# Patient Record
Sex: Male | Born: 1945 | Race: White | Hispanic: No | Marital: Married | Smoking: Former smoker
Health system: Southern US, Community
[De-identification: ages and names within clinical notes are randomized; demographics above are authoritative.]

## PROBLEM LIST (undated history)

## (undated) DIAGNOSIS — I2510 Atherosclerotic heart disease of native coronary artery without angina pectoris: Secondary | ICD-10-CM

## (undated) DIAGNOSIS — I219 Acute myocardial infarction, unspecified: Secondary | ICD-10-CM

## (undated) DIAGNOSIS — I4891 Unspecified atrial fibrillation: Secondary | ICD-10-CM

## (undated) DIAGNOSIS — I10 Essential (primary) hypertension: Secondary | ICD-10-CM

## (undated) DIAGNOSIS — Z973 Presence of spectacles and contact lenses: Secondary | ICD-10-CM

## (undated) DIAGNOSIS — M1990 Unspecified osteoarthritis, unspecified site: Secondary | ICD-10-CM

## (undated) DIAGNOSIS — K5790 Diverticulosis of intestine, part unspecified, without perforation or abscess without bleeding: Secondary | ICD-10-CM

## (undated) DIAGNOSIS — Z8701 Personal history of pneumonia (recurrent): Secondary | ICD-10-CM

## (undated) DIAGNOSIS — I4901 Ventricular fibrillation: Secondary | ICD-10-CM

## (undated) DIAGNOSIS — I4892 Unspecified atrial flutter: Secondary | ICD-10-CM

## (undated) DIAGNOSIS — J449 Chronic obstructive pulmonary disease, unspecified: Secondary | ICD-10-CM

## (undated) DIAGNOSIS — I513 Intracardiac thrombosis, not elsewhere classified: Secondary | ICD-10-CM

## (undated) DIAGNOSIS — I350 Nonrheumatic aortic (valve) stenosis: Secondary | ICD-10-CM

## (undated) HISTORY — DX: Diverticulosis of intestine, part unspecified, without perforation or abscess without bleeding: K57.90

## (undated) HISTORY — DX: Atherosclerotic heart disease of native coronary artery without angina pectoris: I25.10

## (undated) HISTORY — DX: Ventricular fibrillation: I49.01

## (undated) HISTORY — DX: Acute myocardial infarction, unspecified: I21.9

## (undated) HISTORY — DX: Personal history of pneumonia (recurrent): Z87.01

## (undated) HISTORY — DX: Unspecified atrial flutter: I48.92

## (undated) HISTORY — PX: RETINAL LASER PROCEDURE: SHX2339

## (undated) HISTORY — PX: OTHER SURGICAL HISTORY: SHX169

## (undated) HISTORY — DX: Unspecified atrial fibrillation: I48.91

## (undated) HISTORY — DX: Essential (primary) hypertension: I10

## (undated) HISTORY — DX: Unspecified osteoarthritis, unspecified site: M19.90

## (undated) HISTORY — DX: Nonrheumatic aortic (valve) stenosis: I35.0

## (undated) HISTORY — DX: Intracardiac thrombosis, not elsewhere classified: I51.3

---

## 2003-11-12 HISTORY — PX: COLONOSCOPY: SHX174

## 2004-02-22 ENCOUNTER — Ambulatory Visit (HOSPITAL_COMMUNITY): Admission: RE | Admit: 2004-02-22 | Discharge: 2004-02-22 | Payer: Self-pay | Admitting: Family Medicine

## 2004-03-20 ENCOUNTER — Ambulatory Visit (HOSPITAL_COMMUNITY): Admission: RE | Admit: 2004-03-20 | Discharge: 2004-03-20 | Payer: Self-pay | Admitting: Internal Medicine

## 2009-08-16 ENCOUNTER — Ambulatory Visit (HOSPITAL_COMMUNITY): Admission: RE | Admit: 2009-08-16 | Discharge: 2009-08-16 | Payer: Self-pay | Admitting: Family Medicine

## 2009-09-13 ENCOUNTER — Ambulatory Visit (HOSPITAL_COMMUNITY): Admission: RE | Admit: 2009-09-13 | Discharge: 2009-09-13 | Payer: Self-pay | Admitting: Family Medicine

## 2010-04-11 DIAGNOSIS — I513 Intracardiac thrombosis, not elsewhere classified: Secondary | ICD-10-CM

## 2010-04-11 DIAGNOSIS — I219 Acute myocardial infarction, unspecified: Secondary | ICD-10-CM

## 2010-04-11 DIAGNOSIS — I4901 Ventricular fibrillation: Secondary | ICD-10-CM

## 2010-04-11 HISTORY — DX: Intracardiac thrombosis, not elsewhere classified: I51.3

## 2010-04-11 HISTORY — DX: Acute myocardial infarction, unspecified: I21.9

## 2010-04-11 HISTORY — DX: Ventricular fibrillation: I49.01

## 2010-04-24 ENCOUNTER — Inpatient Hospital Stay (HOSPITAL_COMMUNITY): Admission: RE | Admit: 2010-04-24 | Discharge: 2010-05-07 | Payer: Self-pay | Admitting: Cardiology

## 2010-04-24 ENCOUNTER — Ambulatory Visit: Payer: Self-pay | Admitting: Cardiology

## 2010-04-25 ENCOUNTER — Encounter: Payer: Self-pay | Admitting: Cardiothoracic Surgery

## 2010-04-25 ENCOUNTER — Encounter: Payer: Self-pay | Admitting: Cardiology

## 2010-04-25 ENCOUNTER — Ambulatory Visit: Payer: Self-pay | Admitting: Cardiothoracic Surgery

## 2010-04-26 ENCOUNTER — Encounter: Payer: Self-pay | Admitting: Cardiology

## 2010-05-01 ENCOUNTER — Encounter: Payer: Self-pay | Admitting: Cardiothoracic Surgery

## 2010-05-01 ENCOUNTER — Encounter: Payer: Self-pay | Admitting: Cardiology

## 2010-05-04 HISTORY — PX: OTHER SURGICAL HISTORY: SHX169

## 2010-05-09 ENCOUNTER — Ambulatory Visit: Payer: Self-pay | Admitting: Cardiology

## 2010-05-15 ENCOUNTER — Telehealth: Payer: Self-pay | Admitting: Cardiology

## 2010-05-16 ENCOUNTER — Telehealth: Payer: Self-pay | Admitting: Cardiology

## 2010-05-16 ENCOUNTER — Ambulatory Visit: Payer: Self-pay | Admitting: Cardiovascular Disease

## 2010-05-23 ENCOUNTER — Ambulatory Visit: Payer: Self-pay | Admitting: Cardiology

## 2010-05-24 ENCOUNTER — Ambulatory Visit: Payer: Self-pay | Admitting: Cardiology

## 2010-05-24 DIAGNOSIS — R0989 Other specified symptoms and signs involving the circulatory and respiratory systems: Secondary | ICD-10-CM | POA: Insufficient documentation

## 2010-05-24 DIAGNOSIS — I4891 Unspecified atrial fibrillation: Secondary | ICD-10-CM

## 2010-05-24 DIAGNOSIS — I359 Nonrheumatic aortic valve disorder, unspecified: Secondary | ICD-10-CM

## 2010-05-24 DIAGNOSIS — I2510 Atherosclerotic heart disease of native coronary artery without angina pectoris: Secondary | ICD-10-CM

## 2010-05-30 ENCOUNTER — Ambulatory Visit: Payer: Self-pay | Admitting: Cardiology

## 2010-05-31 ENCOUNTER — Ambulatory Visit: Payer: Self-pay | Admitting: Cardiothoracic Surgery

## 2010-05-31 ENCOUNTER — Encounter: Payer: Self-pay | Admitting: Cardiology

## 2010-05-31 ENCOUNTER — Encounter: Admission: RE | Admit: 2010-05-31 | Discharge: 2010-05-31 | Payer: Self-pay | Admitting: Cardiothoracic Surgery

## 2010-06-05 ENCOUNTER — Encounter: Payer: Self-pay | Admitting: Cardiology

## 2010-06-07 ENCOUNTER — Ambulatory Visit: Payer: Self-pay | Admitting: Cardiology

## 2010-06-07 LAB — CONVERTED CEMR LAB: POC INR: 2.1

## 2010-06-13 ENCOUNTER — Ambulatory Visit: Payer: Self-pay | Admitting: Cardiology

## 2010-06-13 LAB — CONVERTED CEMR LAB: POC INR: 2.4

## 2010-06-18 ENCOUNTER — Encounter (HOSPITAL_COMMUNITY): Admission: RE | Admit: 2010-06-18 | Discharge: 2010-07-18 | Payer: Self-pay | Admitting: Cardiology

## 2010-06-25 ENCOUNTER — Encounter: Payer: Self-pay | Admitting: Cardiology

## 2010-06-27 ENCOUNTER — Ambulatory Visit: Payer: Self-pay | Admitting: Cardiology

## 2010-06-28 ENCOUNTER — Ambulatory Visit: Payer: Self-pay | Admitting: Cardiology

## 2010-06-28 ENCOUNTER — Encounter (INDEPENDENT_AMBULATORY_CARE_PROVIDER_SITE_OTHER): Payer: Self-pay | Admitting: *Deleted

## 2010-06-28 LAB — CONVERTED CEMR LAB
Cholesterol: 163 mg/dL (ref 0–200)
Total Bilirubin: 0.4 mg/dL (ref 0.3–1.2)
Total CHOL/HDL Ratio: 3
Total Protein: 6.9 g/dL (ref 6.0–8.3)
Triglycerides: 61 mg/dL (ref <150)

## 2010-06-29 ENCOUNTER — Telehealth: Payer: Self-pay | Admitting: Cardiology

## 2010-07-05 ENCOUNTER — Encounter: Payer: Self-pay | Admitting: Cardiology

## 2010-07-05 ENCOUNTER — Ambulatory Visit: Payer: Self-pay | Admitting: Cardiology

## 2010-07-05 ENCOUNTER — Telehealth: Payer: Self-pay | Admitting: Cardiology

## 2010-07-05 LAB — CONVERTED CEMR LAB: POC INR: 3.1

## 2010-07-06 ENCOUNTER — Telehealth: Payer: Self-pay | Admitting: Cardiology

## 2010-07-06 LAB — CONVERTED CEMR LAB
BUN: 22 mg/dL (ref 6–23)
Calcium: 9.1 mg/dL (ref 8.4–10.5)
Creatinine, Ser: 1.16 mg/dL (ref 0.40–1.50)
HCT: 36.8 % — ABNORMAL LOW (ref 39.0–52.0)
Hemoglobin: 11.9 g/dL — ABNORMAL LOW (ref 13.0–17.0)
MCHC: 32.3 g/dL (ref 30.0–36.0)
Platelets: 247 10*3/uL (ref 150–400)
Potassium: 4.4 meq/L (ref 3.5–5.3)
RDW: 14.5 % (ref 11.5–15.5)
Sodium: 140 meq/L (ref 135–145)
WBC: 4.5 10*3/uL (ref 4.0–10.5)

## 2010-07-09 ENCOUNTER — Ambulatory Visit: Payer: Self-pay | Admitting: Internal Medicine

## 2010-07-09 ENCOUNTER — Ambulatory Visit (HOSPITAL_COMMUNITY): Admission: RE | Admit: 2010-07-09 | Discharge: 2010-07-09 | Payer: Self-pay | Admitting: Cardiology

## 2010-07-11 ENCOUNTER — Ambulatory Visit: Payer: Self-pay | Admitting: Cardiology

## 2010-07-11 LAB — CONVERTED CEMR LAB: POC INR: 2.7

## 2010-07-18 ENCOUNTER — Encounter (HOSPITAL_COMMUNITY)
Admission: RE | Admit: 2010-07-18 | Discharge: 2010-08-17 | Payer: Self-pay | Source: Home / Self Care | Admitting: Cardiology

## 2010-07-18 ENCOUNTER — Ambulatory Visit: Payer: Self-pay | Admitting: Cardiology

## 2010-07-18 LAB — CONVERTED CEMR LAB: POC INR: 2.8

## 2010-07-25 ENCOUNTER — Ambulatory Visit: Payer: Self-pay | Admitting: Cardiology

## 2010-08-01 ENCOUNTER — Ambulatory Visit: Payer: Self-pay | Admitting: Cardiology

## 2010-08-06 ENCOUNTER — Ambulatory Visit: Payer: Self-pay | Admitting: Cardiology

## 2010-08-07 ENCOUNTER — Encounter: Payer: Self-pay | Admitting: Cardiology

## 2010-08-08 ENCOUNTER — Ambulatory Visit: Payer: Self-pay | Admitting: Cardiology

## 2010-08-13 ENCOUNTER — Ambulatory Visit: Payer: Self-pay | Admitting: Cardiology

## 2010-08-13 LAB — CONVERTED CEMR LAB: POC INR: 3.7

## 2010-08-17 ENCOUNTER — Encounter (HOSPITAL_COMMUNITY)
Admission: RE | Admit: 2010-08-17 | Discharge: 2010-09-16 | Payer: Self-pay | Source: Home / Self Care | Admitting: Cardiology

## 2010-08-23 ENCOUNTER — Telehealth: Payer: Self-pay | Admitting: Cardiology

## 2010-08-24 ENCOUNTER — Encounter: Payer: Self-pay | Admitting: Cardiology

## 2010-09-03 ENCOUNTER — Ambulatory Visit: Payer: Self-pay | Admitting: Cardiology

## 2010-09-17 ENCOUNTER — Encounter (HOSPITAL_COMMUNITY)
Admission: RE | Admit: 2010-09-17 | Discharge: 2010-10-17 | Payer: Self-pay | Source: Home / Self Care | Admitting: Cardiology

## 2010-09-24 ENCOUNTER — Ambulatory Visit: Payer: Self-pay | Admitting: Cardiology

## 2010-09-24 LAB — CONVERTED CEMR LAB: POC INR: 2.4

## 2010-09-27 ENCOUNTER — Ambulatory Visit: Payer: Self-pay | Admitting: Cardiothoracic Surgery

## 2010-09-27 ENCOUNTER — Encounter: Payer: Self-pay | Admitting: Cardiology

## 2010-10-01 ENCOUNTER — Ambulatory Visit: Payer: Self-pay | Admitting: Cardiology

## 2010-10-01 ENCOUNTER — Ambulatory Visit: Payer: Self-pay | Admitting: Cardiovascular Disease

## 2010-10-01 ENCOUNTER — Encounter (INDEPENDENT_AMBULATORY_CARE_PROVIDER_SITE_OTHER): Payer: Self-pay | Admitting: *Deleted

## 2010-10-08 ENCOUNTER — Ambulatory Visit: Payer: Self-pay | Admitting: Cardiology

## 2010-10-08 LAB — CONVERTED CEMR LAB: POC INR: 3.4

## 2010-10-10 ENCOUNTER — Ambulatory Visit: Payer: Self-pay | Admitting: Cardiology

## 2010-10-10 ENCOUNTER — Ambulatory Visit (HOSPITAL_COMMUNITY)
Admission: RE | Admit: 2010-10-10 | Discharge: 2010-10-10 | Payer: Self-pay | Source: Home / Self Care | Admitting: Cardiology

## 2010-10-15 ENCOUNTER — Ambulatory Visit: Payer: Self-pay | Admitting: Cardiology

## 2010-10-15 LAB — CONVERTED CEMR LAB: POC INR: 3.2

## 2010-10-24 ENCOUNTER — Ambulatory Visit: Payer: Self-pay | Admitting: Cardiovascular Disease

## 2010-10-24 LAB — CONVERTED CEMR LAB: POC INR: 3

## 2010-10-26 ENCOUNTER — Ambulatory Visit: Payer: Self-pay | Admitting: Cardiology

## 2010-10-26 ENCOUNTER — Encounter: Payer: Self-pay | Admitting: Cardiology

## 2010-10-31 ENCOUNTER — Ambulatory Visit: Payer: Self-pay | Admitting: Cardiology

## 2010-10-31 LAB — CONVERTED CEMR LAB: POC INR: 2

## 2010-11-08 ENCOUNTER — Ambulatory Visit: Payer: Self-pay | Admitting: Cardiology

## 2010-11-21 ENCOUNTER — Ambulatory Visit: Admit: 2010-11-21 | Payer: Self-pay

## 2010-11-21 ENCOUNTER — Encounter (INDEPENDENT_AMBULATORY_CARE_PROVIDER_SITE_OTHER): Payer: Self-pay | Admitting: *Deleted

## 2010-11-28 ENCOUNTER — Ambulatory Visit
Admission: RE | Admit: 2010-11-28 | Discharge: 2010-11-28 | Payer: Self-pay | Source: Home / Self Care | Attending: Cardiology | Admitting: Cardiology

## 2010-11-28 LAB — CONVERTED CEMR LAB: POC INR: 1.4

## 2010-11-30 ENCOUNTER — Encounter: Payer: Self-pay | Admitting: Cardiology

## 2010-11-30 ENCOUNTER — Other Ambulatory Visit: Payer: Self-pay | Admitting: Cardiology

## 2010-11-30 ENCOUNTER — Ambulatory Visit
Admission: RE | Admit: 2010-11-30 | Discharge: 2010-11-30 | Payer: Self-pay | Source: Home / Self Care | Attending: Cardiology | Admitting: Cardiology

## 2010-11-30 LAB — CBC WITH DIFFERENTIAL/PLATELET
Basophils Absolute: 0 10*3/uL (ref 0.0–0.1)
Basophils Relative: 0.7 % (ref 0.0–3.0)
Eosinophils Absolute: 0.3 10*3/uL (ref 0.0–0.7)
Eosinophils Relative: 4.6 % (ref 0.0–5.0)
HCT: 38.3 % — ABNORMAL LOW (ref 39.0–52.0)
Hemoglobin: 13.1 g/dL (ref 13.0–17.0)
Lymphocytes Relative: 20.4 % (ref 12.0–46.0)
Lymphs Abs: 1.2 10*3/uL (ref 0.7–4.0)
MCHC: 34.3 g/dL (ref 30.0–36.0)
MCV: 100.1 fl — ABNORMAL HIGH (ref 78.0–100.0)
Monocytes Absolute: 0.7 10*3/uL (ref 0.1–1.0)
Monocytes Relative: 12 % (ref 3.0–12.0)
Neutro Abs: 3.8 10*3/uL (ref 1.4–7.7)
Neutrophils Relative %: 62.3 % (ref 43.0–77.0)
Platelets: 201 10*3/uL (ref 150.0–400.0)
RBC: 3.82 Mil/uL — ABNORMAL LOW (ref 4.22–5.81)
RDW: 16.3 % — ABNORMAL HIGH (ref 11.5–14.6)
WBC: 6.1 10*3/uL (ref 4.5–10.5)

## 2010-11-30 LAB — HEPATIC FUNCTION PANEL
ALT: 16 U/L (ref 0–53)
AST: 19 U/L (ref 0–37)
Albumin: 3.9 g/dL (ref 3.5–5.2)
Alkaline Phosphatase: 67 U/L (ref 39–117)
Bilirubin, Direct: 0.1 mg/dL (ref 0.0–0.3)
Total Bilirubin: 0.5 mg/dL (ref 0.3–1.2)
Total Protein: 7.2 g/dL (ref 6.0–8.3)

## 2010-11-30 LAB — LIPID PANEL
Cholesterol: 153 mg/dL (ref 0–200)
HDL: 71.2 mg/dL (ref >39.00)
LDL Cholesterol: 72 mg/dL (ref 0–99)
Total CHOL/HDL Ratio: 2
Triglycerides: 48 mg/dL (ref 0.0–149.0)
VLDL: 9.6 mg/dL (ref 0.0–40.0)

## 2010-11-30 LAB — BASIC METABOLIC PANEL
BUN: 23 mg/dL (ref 6–23)
CO2: 29 mEq/L (ref 19–32)
Calcium: 9.5 mg/dL (ref 8.4–10.5)
Chloride: 103 mEq/L (ref 96–112)
Creatinine, Ser: 1.1 mg/dL (ref 0.4–1.5)
GFR: 73.65 mL/min (ref >60.00)
Glucose, Bld: 101 mg/dL — ABNORMAL HIGH (ref 70–99)
Potassium: 4.7 mEq/L (ref 3.5–5.1)
Sodium: 139 mEq/L (ref 135–145)

## 2010-12-02 ENCOUNTER — Encounter: Payer: Self-pay | Admitting: Emergency Medicine

## 2010-12-03 ENCOUNTER — Encounter: Payer: Self-pay | Admitting: Cardiology

## 2010-12-04 ENCOUNTER — Telehealth: Payer: Self-pay | Admitting: Cardiology

## 2010-12-05 ENCOUNTER — Ambulatory Visit: Admission: RE | Admit: 2010-12-05 | Discharge: 2010-12-05 | Payer: Self-pay | Source: Home / Self Care

## 2010-12-05 ENCOUNTER — Telehealth: Payer: Self-pay | Admitting: Cardiology

## 2010-12-05 LAB — CONVERTED CEMR LAB: POC INR: 2.1

## 2010-12-11 NOTE — Assessment & Plan Note (Signed)
Summary: eph   Visit Type:  Follow-up- post hospital Primary Provider:  [name]  CC:  The pt is s/p aortic valve replacement on 05/04/10. He denies any cardiac complaints. He has had an episode of gout. He finished a course of prednisone yesterday and is on colcrys..  History of Present Illness: Patient is 65 years old and turned for management of CAD and died of heart disease after his recent open heart surgery. He was hospitalized with a posterior wall infarction and the catheterization was found to have embolized to marginal branches the circumflex artery. We attempted to open these but one of these but it was not successful and he realizes for embolus that was fairly distal in the vessel. He also was found to have severe calcific aortic stenosis. The day after his infarct he went into atrial fibrillation. We did a TEE and found a large amount of clot in his left atrial appendage. He subsequently underwent surgery with aortic valve replacement, removal of the thrombus in the left atrium, oversewing of the left atrial appendage, and a maze procedure. He was discharged home on amiodarone and Coumadin.  He's done quite well since discharge. His exercise tolerance is much better than it was previously.  Problems Prior to Update: 1)  Aortic Valve Disorders  (ICD-424.1) 2)  Tachycardia  (ICD-785) 3)  Atrial Fibrillation  (ICD-427.31) 4)  Cad, Native Vessel  (ICD-414.01)  Current Medications (verified): 1)  Amiodarone Hcl 200 Mg Tabs (Amiodarone Hcl) .... Take One Tablet By Mouth Twice A Day 2)  Aspirin 81 Mg Tbec (Aspirin) .... Take One Tablet By Mouth Daily 3)  Nu-Iron 150 Mg Caps (Polysaccharide Iron Complex) .... Take One Tab By Mouth Once Daily 4)  Metoprolol Succinate 25 Mg Xr24h-Tab (Metoprolol Succinate) .... Take One- Half  Tablet By Mouth Daily 5)  Crestor 40 Mg Tabs (Rosuvastatin Calcium) .... Take One Tablet By Mouth Daily. 6)  Tramadol Hcl 50 Mg Tabs (Tramadol Hcl) ....  Take One Tab By Mouth Every 4 Hours As Needed For Pain 7)  Warfarin Sodium 5 Mg Tabs (Warfarin Sodium) .... Use As Directed By Anticoagulation Clinic 8)  Albuterol Inhaler .... One To Two Puffs By Mouth Every 4 Hours As Needed 9)  Allopurinol 100 Mg Tabs (Allopurinol) .... Take One Tab By Mouth Once Daily 10)  Colcrys 0.6 Mg Tabs (Colchicine) .... Take One Tab By Mouth Once Daily  Allergies (verified): 1)  ! * Diclofenac  Past History:  Past Medical History: Reviewed history from 05/23/2010 and no changes required.  1. Acute myocardial infarction.   2. Ventricular fibrillation arrest.   3. Critical aortic stenosis.   4. Atrial fibrillation.   5. Intraatrial clot.   6. History of gouty arthritis.   7. History of pneumonia.   8. History of diverticulosis.   9. Postoperative acute blood loss anemia.   10.Postoperative hypotension.      Review of Systems       ROS is negative except as outlined in HPI.   Vital Signs:  Patient profile:   65 year old male Height:      70 inches Weight:      160.25 pounds BMI:     23.08 Pulse rate:   81 / minute BP sitting:   110 / 72  (left arm) Cuff size:   large  Physical Exam  Additional Exam:  Gen. Well-nourished, in no distress   Neck: No JVD, thyroid not enlarged, no carotid bruits Lungs: No tachypnea,  clear without rales, rhonchi or wheezes Cardiovascular: Rhythm irregular, PMI not displaced,  heart sounds  normal, grade 2 of 6 systolic ejection murmur at the left sternal edge, no peripheral edema, pulses normal in all 4 extremities. Abdomen: BS normal, abdomen soft and non-tender without masses or organomegaly, no hepatosplenomegaly. MS: No deformities, no cyanosis or clubbing   Neuro:  No focal sns   Skin:  no lesions    Impression & Recommendations:  Problem # 1:  CAD, NATIVE VESSEL (ICD-414.01) On April 21, 2010 he had a posterior infarct complicated by ventricular fibrillation the catheter lab. His infarct was due to emboli  to the circumflex artery. We tried to open one of the marginal branches but this was not successful due to the distal location of the embolus. He recovered well from the standpoint of his heart attack and has had no recurrent chest pain his problem appears stable. I looked at my catheter report and described only minimal irregularity in the right coronary apart from his 2 emboli so we may not need to treat him as secondary prevention with regard to his statin therapy His updated medication list for this problem includes:    Aspirin 81 Mg Tbec (Aspirin) .Marland Kitchen... Take one tablet by mouth daily    Metoprolol Succinate 25 Mg Xr24h-tab (Metoprolol succinate) .Marland Kitchen... Take one- half  tablet by mouth daily    Warfarin Sodium 5 Mg Tabs (Warfarin sodium) ..... Use as directed by anticoagulation clinic  Problem # 2:  ATRIAL FIBRILLATION (ICD-427.31) He had atrial fibrillation prior to his surgery. He had left atrial thrombus with embolization and was treated with thrombectomy, oversewing of the atrial appendage, and a Maze procedure. He was also discharged on amiodarone. Unfortunately he is in recurrent atrial fibrillation today. We will continue Coumadin amiodarone and make a decision a little bit later about cardioversion. His updated medication list for this problem includes:    Amiodarone Hcl 200 Mg Tabs (Amiodarone hcl) .Marland Kitchen... Take one tablet by mouth once daily    Aspirin 81 Mg Tbec (Aspirin) .Marland Kitchen... Take one tablet by mouth daily    Metoprolol Succinate 25 Mg Xr24h-tab (Metoprolol succinate) .Marland Kitchen... Take one- half  tablet by mouth daily    Warfarin Sodium 5 Mg Tabs (Warfarin sodium) ..... Use as directed by anticoagulation clinic  Problem # 3:  AORTIC VALVE DISORDERS (ICD-424.1) he is now status post aortic valve replacement with tissue valve for aortic stenosis which was found the time of his catheterization. This polyp is stable. He is a tissue valve so if we think he can come off Coumadin from the standpoint of  his atrial fibrillation, he would be able to come off of it from the standpoint of his heart valve. His updated medication list for this problem includes:    Metoprolol Succinate 25 Mg Xr24h-tab (Metoprolol succinate) .Marland Kitchen... Take one- half  tablet by mouth daily  Patient Instructions: 1)  Decrease Crestor to 40mg  1/2 tablet once daily for 2 weeks then stop this medication. 2)  Decrease Amiodarone to 200mg  once daily. 3)  You will need to have your coumadin checked in 1 week due to the change in your amiodarone dose. 4)  Please come FASTING to your next visit- we will be checking your lipid panel at that visit. 5)  Your physician recommends that you schedule a follow-up appointment in: 5 weeks.

## 2010-12-11 NOTE — Medication Information (Signed)
Summary: rov/sl  Anticoagulant Therapy  Managed by: Bethena Midget, RN, BSN PCP: [name] Supervising MD: Dietrich Pates MD, Molly Maduro Indication 1: Aortic Valve Replacement Valve Type: Bioprosthetic Valve Position: Aortic Lab Used: [facility] [place] Site: [address] INR POC 2.9 INR RANGE 2.0-3.0  Dietary changes: no    Health status changes: no    Bleeding/hemorrhagic complications: no    Recent/future hospitalizations: no    Any changes in medication regimen? no    Recent/future dental: no  Any missed doses?: no       Is patient compliant with meds? yes      Comments: Seeing Dr Juanda Chance today. Pending DCCV next week.   Allergies: 1)  ! * Diclofenac  Anticoagulation Management History:      The patient is taking warfarin and comes in today for a routine follow up visit.  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  Positive risk factors for bleeding include an age of 66 years or older.  Negative risk factors for bleeding include no history of CVA/TIA, no history of GI bleeding, and absence of serious comorbidities.  The bleeding index is 'intermediate risk'.  Negative CHADS2 values include History of CHF, History of HTN, Age > 69 years old, History of Diabetes, and Prior Stroke/CVA/TIA.  The start date was 05/06/2010.  Anticoagulation responsible provider: Dietrich Pates MD, Molly Maduro.  INR POC: 2.9.  Cuvette Lot#: [id number].  Exp: 10/2011.    Anticoagulation Management Assessment/Plan:      The patient's current anticoagulation dose is Warfarin sodium 5 mg tabs: Use as directed by Anticoagulation Clinic.  The target INR is 2.0-3.0.  The next INR is due 10/15/2010.  Anticoagulation instructions were given to patient.  Results were reviewed/authorized by Bethena Midget, RN, BSN.  He was notified by Bethena Midget, RN, BSN.         Prior Anticoagulation Instructions: INR 2.4 Continue coumadin 7.5mg  once daily except 5mg  on Thursdays   Current  Anticoagulation Instructions: INR 2.9 Continue 7.5mg s daily except 5mg s on Thursdays. Recheck in 2 weeks in [facility].

## 2010-12-11 NOTE — Assessment & Plan Note (Signed)
Summary: f22m   Visit Type:  Follow-up Primary Provider:  [name]   History of Present Illness: [name] is 65 years old and return for management of valvular heart disease and atrial fibrillation.  in June he was hospitalized with a posterior MI complicated by V. fib in the catheter lab do to emboli to the circumflex artery. He also had severe aortic stenosis. 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  On followup he develop recurrent atrial fibrillation was started on amiodarone and underwent a DC cardioversion on July 09, 2010. His INR had fallen to 1.9 so this was done with a TEE.  Unfortunately, when I saw him back for his last visit he was back in atrial fibrillation. His ECG actually look like atrial flutter. I reviewed the tracings with Dr. Ladona Galloway and he thought it was the left atrial load her which would be difficult for ablation and he has already had a Maze procedure.  We decided to leave him on amiodarone and consider the possibility of a second cardioversion.  He has done quite well since that time has had no chest pain [name] breath or palpitations.  Today is his and his wife's anniversary  Current Medications (verified): 1)  Amiodarone Hcl 200 Mg Tabs (Amiodarone Hcl) .... Take One Tablet By Mouth Once Daily 2)  Aspirin 81 Mg Tbec (Aspirin) .... Take One Tablet By Mouth Daily 3)  Metoprolol Succinate 25 Mg Xr24h-Tab (Metoprolol Succinate) .... Take One- Half  Tablet By Mouth Daily 4)  Warfarin Sodium 5 Mg Tabs (Warfarin Sodium) .... Use As Directed By Anticoagulation Clinic 5)  Allopurinol 100 Mg Tabs (Allopurinol) .... Take One Tab By Mouth Once Daily 6)  Extra Strength Tylenol .... As Needed For Knee Pain  Allergies: 1)  ! * Diclofenac  Past History:  Past Medical History: Reviewed  history from 05/23/2010 and no changes required.  1. Acute myocardial infarction.   2. Ventricular fibrillation arrest.   3. Critical aortic stenosis.   4. Atrial fibrillation.   5. Intraatrial clot.   6. History of gouty arthritis.   7. History of pneumonia.   8. History of diverticulosis.   9. Postoperative acute blood loss anemia.   10.Postoperative hypotension.      Review of Systems       ROS is negative except as outlined in HPI.   Vital Signs:  Patient profile:   65 year old male Height:      70 inches Weight:      182 pounds BMI:     26.21 O2 Sat:      99 % Pulse rate:   87 / minute BP sitting:   110 / 80  (left arm)  Vitals Entered By: Laurance Flatten CMA (October 01, 2010 9:33 AM)  Physical Exam  Additional Exam:  Gen. Well-nourished, in no distress   Neck: No JVD, thyroid not enlarged, no carotid bruits Lungs: No tachypnea, clear without rales, rhonchi or wheezes Cardiovascular: Rhythm regular, PMI not displaced,  heart sounds  normal, no murmurs or gallops, no peripheral edema, pulses normal in all 4 extremities. Abdomen: BS normal, abdomen soft and non-tender without masses or organomegaly, no hepatosplenomegaly. MS: No deformities, no cyanosis or clubbing   Neuro:  No focal sns   Skin:  no lesions    Impression & Recommendations:  Problem # 1:  ATRIAL FIBRILLATION (ICD-427.31) He has recurrent atrial fibrillation which recurred after amiodarone and DC cardioversion. He has had a previous maze procedure. I think we should give him one more attempt at cardioversion even know he is having minimal symptoms. Since he has had his left atrial appendage so in all, if we can get him back in sinus rhythm, I would hold out the possibility that he may be able to come off Coumadin therapy despite his previous embolus. We will schedule him for DC cardioversion next week. His updated medication list for this problem includes:    Amiodarone Hcl 200 Mg Tabs (Amiodarone hcl)  .Marland Kitchen... Take one tablet by mouth once daily    Aspirin 81 Mg Tbec (Aspirin) .Marland Kitchen... Take one tablet by mouth daily    Metoprolol Succinate 25 Mg Xr24h-tab (Metoprolol succinate) .Marland Kitchen... Take one- half  tablet by mouth daily    Warfarin Sodium 5 Mg Tabs (Warfarin sodium) ..... Use as directed by anticoagulation clinic  Orders: EKG w/ Interpretation (93000) Cardioversion (Cardioversion)  Problem # 2:  CAD, NATIVE VESSEL (ICD-414.01) he had an embolus to his circumflex artery causing a posterior MI. He has no other coronary disease. This problem is stable. His updated medication list for this problem includes:    Aspirin 81 Mg Tbec (Aspirin) .Marland Kitchen... Take one tablet by mouth daily    Metoprolol Succinate 25 Mg Xr24h-tab (Metoprolol succinate) .Marland Kitchen... Take one- half  tablet by mouth daily    Warfarin Sodium 5 Mg Tabs (Warfarin sodium) ..... Use as directed by anticoagulation clinic  His updated medication list for this problem includes:    Aspirin 81 Mg Tbec (Aspirin) .Marland Kitchen... Take one tablet by mouth daily    Metoprolol Succinate 25 Mg Xr24h-tab (Metoprolol succinate) .Marland Kitchen... Take one- half  tablet by mouth daily    Warfarin Sodium 5 Mg Tabs (Warfarin sodium) ..... Use as directed by anticoagulation clinic  Problem # 3:  AORTIC VALVE DISORDERS (ICD-424.1) He had aortic valve replacement with a tissue valve. He is doing well and this is stable. His updated medication list for this problem includes:    Metoprolol Succinate 25 Mg Xr24h-tab (Metoprolol succinate) .Marland Kitchen... Take one- half  tablet by mouth daily  His updated medication list for this problem includes:    Metoprolol Succinate 25 Mg Xr24h-tab (Metoprolol succinate) .Marland Kitchen... Take one- half  tablet by mouth daily  Patient Instructions: 1)  You have been scheduled for a Cardioversion on Wed. 10/10/10. Please refer to the instruction sheet given to you today. 2)  Your physician recommends that you schedule a follow-up appointment in: 3 weeks. 3)  We will  check your coumadin level on Monday or Tuesday in the [place] office.

## 2010-12-11 NOTE — Progress Notes (Signed)
Summary: Question about lab and ccr appt  Phone Note Call from Patient Call back at [phone]   Caller: Spouse/ carolyn Summary of Call: Question about pt lab and ccr appt Initial call taken by: Judie Grieve,  June 29, 2010 2:30 PM  Follow-up for Phone Call        PT'S WIFE CALLED RE CC APPT SCHEDULED FOR 07/11/10 NEEDS TO DONE PRIOR TO TEE CARDIOVERSION  SCHEDULED  FOR 07/09/10 PT'S WIFE TO CALL [place] OFFICE  TO SET UP  AND TO HAVE PRE PROCEDURE LABS DONE AS WELL. Follow-up by: Scherrie Bateman, LPN,  June 29, 2010 2:42 PM

## 2010-12-11 NOTE — Medication Information (Signed)
Summary: ccr-lr  Anticoagulant Therapy  Managed by: Vashti Hey, RN PCP: [name] Supervising MD: Dietrich Pates MD, Molly Maduro Indication 1: Aortic Valve Replacement Valve Type: Bioprosthetic Valve Position: Aortic Lab Used: [facility] [place] Site: [place] INR POC 3.7  Dietary changes: no    Health status changes: no    Bleeding/hemorrhagic complications: no    Recent/future hospitalizations: no    Any changes in medication regimen? no    Recent/future dental: no  Any missed doses?: no       Is patient compliant with meds? yes       Allergies: 1)  ! * Diclofenac  Anticoagulation Management History:      The patient is taking warfarin and comes in today for a routine follow up visit.  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  Positive risk factors for bleeding include an age of 66 years or older.  Negative risk factors for bleeding include no history of CVA/TIA, no history of GI bleeding, and absence of serious comorbidities.  The bleeding index is 'intermediate risk'.  Negative CHADS2 values include History of CHF, History of HTN, Age > 43 years old, History of Diabetes, and Prior Stroke/CVA/TIA.  The start date was 05/06/2010.  Anticoagulation responsible provider: Dietrich Pates MD, Molly Maduro.  INR POC: 3.7.  Cuvette Lot#: [id number].    Anticoagulation Management Assessment/Plan:      The patient's current anticoagulation dose is Warfarin sodium 5 mg tabs: Use as directed by Anticoagulation Clinic.  The target INR is 2.0-3.0.  The next INR is due 09/03/2010.  Anticoagulation instructions were given to patient.  Results were reviewed/authorized by Vashti Hey, RN.  He was notified by Vashti Hey RN.         Prior Anticoagulation Instructions: INR 2.7 Continue coumadin 7.5mg  once daily   Current Anticoagulation Instructions: INR 3.7 Hold coumadin tonight then decrease dose to 7.5mg  once daily except 5mg  on Thursdays

## 2010-12-11 NOTE — Letter (Signed)
Summary: Cardioversion/TEE Instructions  Architectural technologist, Main Office  [address]   [place], [place] [address]   Phone: [phone]  Fax: [phone]    Cardioversion / TEE Cardioversion Instructions  06/28/2010 MRN: [id number]  [name] [address] [place], [place]  [address]  Dear [name], LESSER are scheduled for a Cardioversion on Monday 07/09/10 with Dr. Juanda Chance.   Please arrive at the [facility] of [facility] at 7:00 a.m. on the day of your procedure.  1)   DIET:  A)   Nothing to eat or drink after midnight except your medications with a sip of water.   2)   Come to the [place] office on (the day of your next coumadin check in [place]) for lab work. The lab at [facility] is open from 8:30 a.m. to 1:30 p.m. and 2:30 p.m. to 5:00 p.m. The lab at [facility] is open from 7:30 a.m. to 5:30 p.m. You do not have to be fasting.  3)   MAKE SURE YOU TAKE YOUR COUMADIN.  4)   A)   DO NOT TAKE these medications before your procedure:      _________________________________________________________________     ___________________________________________________________________     ___________________________________________________________________  B)   YOU MAY TAKE ALL of your remaining medications with a small amount of water.    C)   START NEW medications:       ___________________________________________________________________     ___________________________________________________________________  5)  Must have a responsible person to drive you home.  6)   Bring a current list of your medications and current insurance cards.   * Special Note:  Every effort is made to have your procedure done on time. Occasionally there are emergencies that present themselves at the hospital that may cause delays. Please be patient if a delay does occur.  * If you have any questions after you get home, please call the office  at [phone].

## 2010-12-11 NOTE — Progress Notes (Signed)
Summary: Is patient going to start Pradaxa?   Phone Note Outgoing Call   Call placed by: Sherri Rad, RN, BSN,  August 23, 2010 10:05 AM Call placed to: Patient Summary of Call: I left a message for the pt to call and see if he has decided to start Pradaxa or not. Sherri Rad, RN, BSN  August 23, 2010 10:06 AM  I spoke with the pt. He is not taking pradaxa. He will f/u with Dr. Juanda Chance on 11/21 as scheduled. Initial call taken by: Sherri Rad, RN, BSN,  August 23, 2010 4:38 PM

## 2010-12-11 NOTE — Medication Information (Signed)
Summary: ccr-lr  Anticoagulant Therapy  Managed by: Vashti Hey, RN PCP: [name] Supervising MD: [name], [name] Indication 1: Aortic Valve Replacement Valve Type: Bioprosthetic Valve Position: Aortic Lab Used: [facility] [place] Site: [address] INR POC 3.2 INR RANGE 2.0-3.0  Dietary changes: no    Health status changes: no    Bleeding/hemorrhagic complications: no    Recent/future hospitalizations: yes       Details: S/P DCCV 10/10/10  successful  Any changes in medication regimen? no    Recent/future dental: no  Any missed doses?: no       Is patient compliant with meds? yes       Allergies: 1)  ! * Diclofenac  Anticoagulation Management History:      The patient is taking warfarin and comes in today for a routine follow up visit.  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  Positive risk factors for bleeding include an age of 66 years or older.  Negative risk factors for bleeding include no history of CVA/TIA, no history of GI bleeding, and absence of serious comorbidities.  The bleeding index is 'intermediate risk'.  Negative CHADS2 values include History of CHF, History of HTN, Age > 55 years old, History of Diabetes, and Prior Stroke/CVA/TIA.  The start date was 05/06/2010.  Anticoagulation responsible provider: [name], [name].  INR POC: 3.2.  Cuvette Lot#: [id number].  Exp: 10/2011.    Anticoagulation Management Assessment/Plan:      The patient's current anticoagulation dose is Warfarin sodium 5 mg tabs: Use as directed by Anticoagulation Clinic.  The target INR is 2.0-3.0.  The next INR is due 10/24/2010.  Anticoagulation instructions were given to patient.  Results were reviewed/authorized by Vashti Hey, RN.  He was notified by Vashti Hey RN.        Coagulation management information includes: Aortic Tissue Valve 05/01/10     TEE/DCCV by Dr Juanda Chance on 10/10/10.  Prior Anticoagulation Instructions: INR  3.4 Continue coumadin 7.5mg  once daily except 5mg  on Thursdays  Current Anticoagulation Instructions: INR 3.2  (1st post DCCV) Decrease coumadin to 7.5mg  once daily except 5mg  on Mondays and Thursdays

## 2010-12-11 NOTE — Letter (Signed)
Summary: [facility] Office Visit   [facility] Office Visit   Imported By: Roderic Ovens 06/18/2010 12:40:30  _____________________________________________________________________  External Attachment:    Type:   Image     Comment:   External Document

## 2010-12-11 NOTE — Consult Note (Signed)
Summary: [facility]  [facility]   Imported By: Marylou Mccoy 06/06/2010 10:21:57  _____________________________________________________________________  External Attachment:    Type:   Image     Comment:   External Document

## 2010-12-11 NOTE — Progress Notes (Signed)
Summary: lab results  Phone Note Call from Patient Call back at Home Phone [phone]   Caller: Patient Reason for Call: Lab or Test Results Initial call taken by: Judie Grieve,  July 06, 2010 3:19 PM  Follow-up for Phone Call        I spoke with the pt. Follow-up by: Sherri Rad, RN, BSN,  July 06, 2010 3:26 PM

## 2010-12-11 NOTE — Medication Information (Signed)
Summary: ccr-lr  Anticoagulant Therapy  Managed by: Vashti Hey, RN PCP: 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 no    Recent/future dental: no  Any missed doses?: no       Is patient compliant with meds? yes       Allergies: 1)  ! * Diclofenac  Anticoagulation Management History:      The patient is taking warfarin and comes in today for a routine follow up visit.  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  Positive risk factors for bleeding include an age of 66 years or older.  Negative risk factors for bleeding include no history of CVA/TIA, no history of GI bleeding, and absence of serious comorbidities.  The bleeding index is 'intermediate risk'.  Negative CHADS2 values include History of CHF, History of HTN, Age > 57 years old, History of Diabetes, and Prior Stroke/CVA/TIA.  The start date was 05/06/2010.  Anticoagulation responsible provider: Diona Browner MD, Remi Deter.  INR POC: 3.4.  Cuvette Lot#: [id number].  Exp: 10/2011.    Anticoagulation Management Assessment/Plan:      The patient's current anticoagulation dose is Warfarin sodium 5 mg tabs: Use as directed by Anticoagulation Clinic.  The target INR is 2.0-3.0.  The next INR is due 10/15/2010.  Anticoagulation instructions were given to patient.  Results were reviewed/authorized by Vashti Hey, RN.  He was notified by Vashti Hey RN.        Coagulation management information includes: Aortic Tissue Valve 05/01/10       Pending TEE/DCCV by Dr Juanda Chance on 10/10/10.  Prior Anticoagulation  Instructions: INR 2.9 Continue 7.5mg s daily except 5mg s on Thursdays. Recheck in 2 weeks in RDS.   Current Anticoagulation Instructions: INR 3.4 Continue coumadin 7.5mg  once daily except 5mg  on Thursdays

## 2010-12-11 NOTE — Miscellaneous (Signed)
Summary: addded echo  Clinical Lists Changes  Observations: Added new observation of ECHOINTERP: Study Conclusions  - Left ventricle: Systolic function was normal. The estimated   ejection fraction was in the range of 50% to 55%. - Aortic valve: A bioprosthesis was present. Trivial regurgitation. - Mitral valve: No evidence of vegetation. Mild regurgitation. - Left atrium: The atrium was moderately dilated. No evidence of   thrombus in the atrial cavity or appendage. - Right atrium: The atrium was moderately dilated. - Tricuspid valve: No evidence of vegetation. Transesophageal echocardiography. 2D and color Doppler. Patient status: Outpatient. Location: Endoscopy.  (07/09/2010 16:21)      Echocardiogram  Procedure date:  07/09/2010  Findings:      Study Conclusions  - Left ventricle: Systolic function was normal. The estimated   ejection fraction was in the range of 50% to 55%. - Aortic valve: A bioprosthesis was present. Trivial regurgitation. - Mitral valve: No evidence of vegetation. Mild regurgitation. - Left atrium: The atrium was moderately dilated. No evidence of   thrombus in the atrial cavity or appendage. - Right atrium: The atrium was moderately dilated. - Tricuspid valve: No evidence of vegetation. Transesophageal echocardiography. 2D and color Doppler. Patient status: Outpatient. Location: Endoscopy.

## 2010-12-11 NOTE — Medication Information (Signed)
Summary: PROTIME/TG  Anticoagulant Therapy  Managed by: Vashti Hey, RN PCP: [name] Supervising MD: [name], [name] Indication 1: Aortic Valve Replacement Valve Type: Bioprosthetic Valve Position: Aortic Lab Used: [facility] [place] Site: [place] INR POC 2.7  Dietary changes: no    Health status changes: no    Bleeding/hemorrhagic complications: no    Recent/future hospitalizations: no    Any changes in medication regimen? no    Recent/future dental: no  Any missed doses?: no       Is patient compliant with meds? yes       Allergies: 1)  ! * Diclofenac  Anticoagulation Management History:      The patient is taking warfarin and comes in today for a routine follow up visit.  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  Negative risk factors for bleeding include an age less than 27 years old, no history of CVA/TIA, no history of GI bleeding, and absence of serious comorbidities.  The bleeding index is 'low risk'.  Negative CHADS2 values include History of CHF, History of HTN, Age > 102 years old, History of Diabetes, and Prior Stroke/CVA/TIA.  The start date was 05/06/2010.  Anticoagulation responsible provider: [name], [name].  INR POC: 2.7.    Anticoagulation Management Assessment/Plan:      The patient's current anticoagulation dose is Warfarin sodium 5 mg tabs: Use as directed by Anticoagulation Clinic.  The target INR is 2.0-3.0.  The next INR is due 07/18/2010.  Anticoagulation instructions were given to patient.  Results were reviewed/authorized by Vashti Hey, RN.  He was notified by Vashti Hey RN.         Prior Anticoagulation Instructions: INR 2.4 Continue coumadin 7.5mg  once daily   Current Anticoagulation Instructions: INR 2.7 Continue coumadin 7.5mg  once daily

## 2010-12-11 NOTE — Medication Information (Signed)
Summary: CCN  Anticoagulant Therapy  Managed by: Vashti Hey, RN Supervising MD: Dietrich Pates MD, Molly Maduro Indication 1: Aortic Valve Replacement Valve Type: Bioprosthetic Valve Position: Aortic Lab Used: [facility] [place] Site: [place] INR POC 1.6  Dietary changes: no    Health status changes: no    Bleeding/hemorrhagic complications: no    Recent/future hospitalizations: yes       Details: In [facility] 6/14 - 6/26  Acute MI, Aortic Valve Replacement  Any changes in medication regimen? yes       Details: Started on coumadin in hospital 5mg  qd.   Has 5mg  tablet.  Recent/future dental: no  Any missed doses?: no       Is patient compliant with meds? yes       Anticoagulation Management History:      The patient comes in today for his initial visit for anticoagulation therapy.  The patient is on warfarin for a tissue prosthetic heart valve.  Negative risk factors for bleeding include an age less than 6 years old, no history of CVA/TIA, no history of GI bleeding, and absence of serious comorbidities.  The bleeding index is 'low risk'.  Negative CHADS2 values include History of CHF, History of HTN, Age > 56 years old, History of Diabetes, and Prior Stroke/CVA/TIA.  The start date was 05/06/2010.  Anticoagulation responsible provider: Dietrich Pates MD, Molly Maduro.  INR POC: 1.6.  Cuvette Lot#: [id number].    Anticoagulation Management Assessment/Plan:      The target INR is 2.0-3.0.  The next INR is due 05/16/2010.  Anticoagulation instructions were given to patient.  Results were reviewed/authorized by Vashti Hey, RN.  He was notified by Vashti Hey RN.        Coagulation management information includes: Aortic Tissue Valve 05/01/10.  Current Anticoagulation Instructions: INR 1.6 Take coumadin 7.5mg  tonight then resume 5mg  once daily

## 2010-12-11 NOTE — Op Note (Signed)
Summary: [facility]   [facility]   Imported By: Roderic Ovens 05/28/2010 10:44:23  _____________________________________________________________________  External Attachment:    Type:   Image     Comment:   External Document

## 2010-12-11 NOTE — Medication Information (Signed)
Summary: ccr-lr  Anticoagulant Therapy  Managed by: Tyler Hey, RN PCP: [name] Supervising Galloway: [name], Remi Deter Indication 1: Aortic Valve Replacement Valve Type: Bioprosthetic Valve Position: Aortic Lab Used: [facility] [place] Site: [place] INR POC 2.1  Dietary changes: no    Health status changes: no    Bleeding/hemorrhagic complications: no    Recent/future hospitalizations: no    Any changes in medication regimen? no    Recent/future dental: no  Any missed doses?: no       Is patient compliant with meds? yes       Allergies: 1)  ! * Diclofenac  Anticoagulation Management History:      The patient is taking warfarin and comes in today for a routine follow up visit.  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  Negative risk factors for bleeding include an age less than 44 years old, no history of CVA/TIA, no history of GI bleeding, and absence of serious comorbidities.  The bleeding index is 'low risk'.  Negative CHADS2 values include History of CHF, History of HTN, Age > 93 years old, History of Diabetes, and Prior Stroke/CVA/TIA.  The start date was 05/06/2010.  Anticoagulation responsible provider: Diona Browner Galloway, Remi Deter.  INR POC: 2.1.  Cuvette Lot#: [id number].    Anticoagulation Management Assessment/Plan:      The patient's current anticoagulation dose is Warfarin sodium 5 mg tabs: Use as directed by Anticoagulation Clinic.  The target INR is 2.0-3.0.  The next INR is due 08/01/2010.  Anticoagulation instructions were given to patient.  Results were reviewed/authorized by Tyler Hey, RN.  He was notified by Tyler Hey RN.         Prior Anticoagulation Instructions: INR 2.8 Continue coumadin 7.5mg  once daily except 5mg  on Thursdays  Current Anticoagulation Instructions: INR 2.1 Continue coumadin 7.5mg  once daily except 5mg  on Thursdays

## 2010-12-11 NOTE — Progress Notes (Signed)
Summary: eph appt   Phone Note Call from Patient Call back at [phone]   Caller: Spouse Reason for Call: Talk to Nurse Summary of Call: due for 2 week eph with Dr Juanda Chance, states he needs to be seen before then, request to speak to nurse Initial call taken by: Migdalia Dk,  May 15, 2010 9:55 AM  Follow-up for Phone Call        [facility]. Sherri Rad, RN, BSN  May 15, 2010 10:55 AM I spoke with the pt's wife and he is scheduled for 7/14 with Dr. Juanda Chance. Follow-up by: Sherri Rad, RN, BSN,  May 15, 2010 11:51 AM

## 2010-12-11 NOTE — Medication Information (Signed)
Summary: ccr-lr  Anticoagulant Therapy  Managed by: Vashti Hey, RN PCP: [name] Supervising MD: Dietrich Pates MD, Molly Maduro Indication 1: Aortic Valve Replacement Valve Type: Bioprosthetic Valve Position: Aortic Lab Used: [facility] [place] Site: [place] INR POC 2.8  Dietary changes: no    Health status changes: no    Bleeding/hemorrhagic complications: no    Recent/future hospitalizations: no    Any changes in medication regimen? no    Recent/future dental: no  Any missed doses?: no       Is patient compliant with meds? yes       Allergies: 1)  ! * Diclofenac  Anticoagulation Management History:      The patient is taking warfarin and comes in today for a routine follow up visit.  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  Negative risk factors for bleeding include an age less than 42 years old, no history of CVA/TIA, no history of GI bleeding, and absence of serious comorbidities.  The bleeding index is 'low risk'.  Negative CHADS2 values include History of CHF, History of HTN, Age > 63 years old, History of Diabetes, and Prior Stroke/CVA/TIA.  The start date was 05/06/2010.  Anticoagulation responsible provider: Dietrich Pates MD, Molly Maduro.  INR POC: 2.8.  Cuvette Lot#: [id number].    Anticoagulation Management Assessment/Plan:      The patient's current anticoagulation dose is Warfarin sodium 5 mg tabs: Use as directed by Anticoagulation Clinic.  The target INR is 2.0-3.0.  The next INR is due 07/25/2010.  Anticoagulation instructions were given to patient.  Results were reviewed/authorized by Vashti Hey, RN.  He was notified by Vashti Hey RN.         Prior Anticoagulation Instructions: INR 2.7 Continue coumadin 7.5mg  once daily except 5mg  on Thursdays  Current Anticoagulation Instructions: INR 2.8 Continue coumadin 7.5mg  once daily except 5mg  on Thursdays

## 2010-12-11 NOTE — Medication Information (Signed)
Summary: ccr-lr  Anticoagulant Therapy  Managed by: Vashti Hey, RN PCP: [name] Supervising MD: Diona Browner MD, Remi Deter Indication 1: Aortic Valve Replacement Valve Type: Bioprosthetic Valve Position: Aortic Lab Used: [facility] [place] Site: [place] INR POC 2.4  Dietary changes: no    Health status changes: no    Bleeding/hemorrhagic complications: no    Recent/future hospitalizations: no    Any changes in medication regimen? no    Recent/future dental: no  Any missed doses?: no       Is patient compliant with meds? yes       Allergies: 1)  ! * Diclofenac  Anticoagulation Management History:      The patient is taking warfarin and comes in today for a routine follow up visit.  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  Negative risk factors for bleeding include an age less than 19 years old, no history of CVA/TIA, no history of GI bleeding, and absence of serious comorbidities.  The bleeding index is 'low risk'.  Negative CHADS2 values include History of CHF, History of HTN, Age > 83 years old, History of Diabetes, and Prior Stroke/CVA/TIA.  The start date was 05/06/2010.  Anticoagulation responsible provider: Diona Browner MD, Remi Deter.  INR POC: 2.4.  Cuvette Lot#: [id number].    Anticoagulation Management Assessment/Plan:      The patient's current anticoagulation dose is Warfarin sodium 5 mg tabs: Use as directed by Anticoagulation Clinic.  The target INR is 2.0-3.0.  The next INR is due 06/27/2010.  Anticoagulation instructions were given to patient.  Results were reviewed/authorized by Vashti Hey, RN.  He was notified by Vashti Hey RN.         Prior Anticoagulation Instructions: INR 2.1 Increase coumadin to 7.5mg  once daily   Current Anticoagulation Instructions: INR 2.4 Continue coumadin 7.5mg  once daily

## 2010-12-11 NOTE — Medication Information (Signed)
Summary: ccr-lr  Anticoagulant Therapy  Managed by: Tyler Hey, RN PCP: [name] Supervising Galloway: [name], [name] Indication 1: Aortic Valve Replacement Valve Type: Bioprosthetic Valve Position: Aortic Lab Used: [facility] [place] Site: [place] INR POC 3.0  Dietary changes: no    Health status changes: no    Bleeding/hemorrhagic complications: no    Recent/future hospitalizations: no    Any changes in medication regimen? no    Recent/future dental: no  Any missed doses?: no       Is patient compliant with meds? yes       Allergies: 1)  ! * Diclofenac  Anticoagulation Management History:      The patient is taking warfarin and comes in today for a routine follow up visit.  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  Positive risk factors for bleeding include an age of 66 years or older.  Negative risk factors for bleeding include no history of CVA/TIA, no history of GI bleeding, and absence of serious comorbidities.  The bleeding index is 'intermediate risk'.  Negative CHADS2 values include History of CHF, History of HTN, Age > 43 years old, History of Diabetes, and Prior Stroke/CVA/TIA.  The start date was 05/06/2010.  Anticoagulation responsible provider: Daleen Squibb Galloway, [name].  INR POC: 3.0.  Cuvette Lot#: [id number].    Anticoagulation Management Assessment/Plan:      The patient's current anticoagulation dose is Warfarin sodium 5 mg tabs: Use as directed by Anticoagulation Clinic.  The target INR is 2.0-3.0.  The next INR is due 09/24/2010.  Anticoagulation instructions were given to patient.  Results were reviewed/authorized by Tyler Hey, RN.  He was notified by Tyler Hey RN.         Prior Anticoagulation Instructions: INR 3.7 Hold coumadin tonight then decrease dose to 7.5mg  once daily except 5mg  on Thursdays  Current Anticoagulation Instructions: INR 3.0 Continue coumadin 7.5mg  once daily except 5mg  on Thursdays

## 2010-12-11 NOTE — Medication Information (Signed)
Summary: ccr-lr  Anticoagulant Therapy  Managed by: Tyler Hey, RN PCP: [name] Supervising MD: [name], [name] Indication 1: Aortic Valve Replacement Valve Type: Bioprosthetic Valve Position: Aortic Lab Used: [facility] [place] Site: [place] INR POC 1.8  Dietary changes: no    Health status changes: no    Bleeding/hemorrhagic complications: no    Recent/future hospitalizations: no    Any changes in medication regimen? no    Recent/future dental: no  Any missed doses?: no       Is patient compliant with meds? yes       Allergies: 1)  ! * Diclofenac  Anticoagulation Management History:      The patient is taking warfarin and comes in today for a routine follow up visit.  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  Negative risk factors for bleeding include an age less than 40 years old, no history of CVA/TIA, no history of GI bleeding, and absence of serious comorbidities.  The bleeding index is 'low risk'.  Negative CHADS2 values include History of CHF, History of HTN, Age > 28 years old, History of Diabetes, and Prior Stroke/CVA/TIA.  The start date was 05/06/2010.  Anticoagulation responsible provider: [name], [name].  INR POC: 1.8.  Cuvette Lot#: [id number].    Anticoagulation Management Assessment/Plan:      The patient's current anticoagulation dose is Warfarin sodium 5 mg tabs: Use as directed by Anticoagulation Clinic.  The target INR is 2.0-3.0.  The next INR is due 06/07/2010.  Anticoagulation instructions were given to patient.  Results were reviewed/authorized by Tyler Hey, RN.  He was notified by Tyler Hey RN.         Prior Anticoagulation Instructions: INR 2.0 Continue coumadin 5mg  once daily except 7.5 on Mondays, Wednesdays and Fridays  Current Anticoagulation Instructions: INR 1.8 Increase coumadin to 7.5mg  once daily except 5mg  on Tuesdays

## 2010-12-11 NOTE — Progress Notes (Signed)
Summary: request poss sooner appt  Phone Note Call from Patient   Caller: Spouse Reason for Call: Talk to Nurse Summary of Call: pt had an aortic valve replacement 6-24-pt's ankle is swollen x 3 days-has had this problem in the past but wife not sure if the same ankle-wondering if this is related to surgery and if he should see dr Juanda Chance sooner or dr gerhardt rs this-appt w/Zaylin Pistilli is 7-14-pls call wife mrs Pflum @ [phone] or [phone] Initial call taken by: Glynda Jaeger,  May 16, 2010 11:08 AM  Follow-up for Phone Call        I called and spoke with the pt's wife. She states the pt is having unilateral swelling to one ankle and that it is painful. He has had similar episodes before, but she cannot recall to which ankle. He has been treated for gout before and he does feel like this is a flare up. I have advised them to contact his PCP or Urgent Care for evaluation. They have been advised to take a list of all his current drugs to the MD that sees him. Follow-up by: Sherri Rad, RN, BSN,  May 16, 2010 12:01 PM

## 2010-12-11 NOTE — Assessment & Plan Note (Signed)
Summary: 1 month rov   Visit Type:  Follow-up Primary Provider:  [name]   History of Present Illness: [name] is 65 years old and return for management of valvular heart disease and atrial fibrillation.  in June he was hospitalized with a posterior MI complicated by V. fib in the catheter lab do to emboli to the circumflex artery. He also had severe aortic stenosis. 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  On followup he develop recurrent atrial fibrillation was started on amiodarone and underwent a DC cardioversion on July 09, 2010. His INR had fallen to 1.9 so this was done with a TEE.  He has had no recurrent symptoms since that time and has been involved in the rehabilitation program. Unfortunately he is back in atrial fibrillation today.  Current Medications (verified): 1)  Amiodarone Hcl 200 Mg Tabs (Amiodarone Hcl) .... Take One Tablet By Mouth Once Daily 2)  Aspirin 81 Mg Tbec (Aspirin) .... Take One Tablet By Mouth Daily 3)  Metoprolol Succinate 25 Mg Xr24h-Tab (Metoprolol Succinate) .... Take One- Half  Tablet By Mouth Daily 4)  Warfarin Sodium 5 Mg Tabs (Warfarin Sodium) .... Use As Directed By Anticoagulation Clinic 5)  Allopurinol 100 Mg Tabs (Allopurinol) .... Take One Tab By Mouth Once Daily 6)  Extra Strength Tylenol .... As Needed For Knee Pain  Allergies (verified): 1)  ! * Diclofenac  Past History:  Past Medical History: Reviewed history from 05/23/2010 and no changes required.  1. Acute myocardial infarction.   2. Ventricular fibrillation arrest.   3. Critical aortic stenosis.   4. Atrial fibrillation.   5. Intraatrial clot.   6. History of gouty arthritis.   7. History of pneumonia.   8. History of diverticulosis.   9. Postoperative acute blood loss anemia.   10.Postoperative hypotension.      Review of Systems       ROS is negative except as outlined in HPI.   Vital Signs:  Patient profile:   65 year old male Height:      70 inches Weight:      178 pounds Pulse rate:   90 / minute BP sitting:   123 / 84  (left arm) Cuff size:   regular  Vitals Entered By: Burnett Kanaris, CNA (August 08, 2010 10:09 AM)  Physical Exam  Additional Exam:  Gen. Well-nourished, in no distress   Neck: No JVD, thyroid not enlarged, no carotid bruits Lungs: No tachypnea, clear without rales, rhonchi or wheezes Cardiovascular: Rhythm irregular, PMI not displaced,  heart sounds  normal, no murmurs or gallops, no peripheral edema, pulses normal in all 4 extremities. Abdomen: BS normal, abdomen soft and non-tender without masses or organomegaly, no hepatosplenomegaly. MS: No deformities, no cyanosis or clubbing   Neuro:  No focal sns   Skin:  no lesions    Impression & Recommendations:  Problem # 1:  AORTIC VALVE DISORDERS (ICD-424.1)  He is status post aortic valve replacement with a tissue valve for severe aortic stenosis. This appears stable. His updated medication list for this problem includes:    Metoprolol Succinate 25 Mg Xr24h-tab (Metoprolol succinate) .Marland Kitchen... Take one- half  tablet by mouth daily  His updated medication list for this problem includes:    Metoprolol Succinate 25 Mg Xr24h-tab (Metoprolol succinate) .Marland Kitchen... Take one- half  tablet  by mouth daily  Problem # 2:  ATRIAL FIBRILLATION (ICD-427.31)  He has recurrent atrial fibrillation. This was initially treated with a maze procedure and then after recurrence was treated with amiodarone and DC cardioversion. He is not having any symptoms. We will continue his amiodarone and now see him back in 2 months. At that time we'll decide whether to try another attempt at cardioversion are settled for atrial flutter/fibrillation.  The ECG appeared to show atrial flutter and a review this with Dr.  Ladona Ridgel who thought it may be a left atrial flutter flutter. Considering the overall situation he did not recommend ablation. His updated medication list for this problem includes:    Amiodarone Hcl 200 Mg Tabs (Amiodarone hcl) .Marland Kitchen... Take one tablet by mouth once daily    Aspirin 81 Mg Tbec (Aspirin) .Marland Kitchen... Take one tablet by mouth daily    Metoprolol Succinate 25 Mg Xr24h-tab (Metoprolol succinate) .Marland Kitchen... Take one- half  tablet by mouth daily    Warfarin Sodium 5 Mg Tabs (Warfarin sodium) ..... Use as directed by anticoagulation clinic  His updated medication list for this problem includes:    Amiodarone Hcl 200 Mg Tabs (Amiodarone hcl) .Marland Kitchen... Take one tablet by mouth once daily    Aspirin 81 Mg Tbec (Aspirin) .Marland Kitchen... Take one tablet by mouth daily    Metoprolol Succinate 25 Mg Xr24h-tab (Metoprolol succinate) .Marland Kitchen... Take one- half  tablet by mouth daily    Warfarin Sodium 5 Mg Tabs (Warfarin sodium) ..... Use as directed by anticoagulation clinic  Orders: EKG w/ Interpretation (93000)  Patient Instructions: 1)  You may visit [email or web address].com for information on this medication.  2)  Start Celebrex 200mg  once daily. 3)  Your physician recommends that you schedule a follow-up appointment in: 2 months. Prescriptions: CELEBREX 200 MG CAPS (CELECOXIB) take one tablet by mouth once daily  #30 x 6   Entered by:   Sherri Rad, RN, BSN   Authorized by:   Lenoria Farrier, MD, [facility]   Signed by:   Sherri Rad, RN, BSN on 08/08/2010   Method used:   Print then Give to Patient   RxID:   [id number] PRADAXA 150 MG CAPS (DABIGATRAN ETEXILATE MESYLATE) take one tab by mouth two times a day  #60 x 6   Entered by:   Sherri Rad, RN, BSN   Authorized by:   Lenoria Farrier, MD, [facility]   Signed by:   Sherri Rad, RN, BSN on 08/08/2010   Method used:   Print then Give to Patient   RxID:   [id number]

## 2010-12-11 NOTE — Progress Notes (Signed)
Summary: returning call  Phone Note Call from Patient   Caller: Patient Reason for Call: Talk to Nurse Summary of Call: returning call-can be reached until 4p-pls call [phone] until 4p Initial call taken by: Glynda Jaeger,  July 05, 2010 3:22 PM  Follow-up for Phone Call        I spoke with the pt. Follow-up by: Sherri Rad, RN, BSN,  July 05, 2010 4:38 PM

## 2010-12-11 NOTE — Medication Information (Signed)
Summary: ccr-lr  Anticoagulant Therapy  Managed by: Vashti Hey, RN Supervising MD: Diona Browner MD, Remi Deter Indication 1: Aortic Valve Replacement Valve Type: Bioprosthetic Valve Position: Aortic Lab Used: [facility] [place] Site: [place] INR POC 2.0  Dietary changes: no    Health status changes: no    Bleeding/hemorrhagic complications: no    Recent/future hospitalizations: no    Any changes in medication regimen? no    Recent/future dental: no  Any missed doses?: no       Is patient compliant with meds? yes       Anticoagulation Management History:      The patient is taking warfarin and comes in today for a routine follow up visit.  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  Negative risk factors for bleeding include an age less than 5 years old, no history of CVA/TIA, no history of GI bleeding, and absence of serious comorbidities.  The bleeding index is 'low risk'.  Negative CHADS2 values include History of CHF, History of HTN, Age > 31 years old, History of Diabetes, and Prior Stroke/CVA/TIA.  The start date was 05/06/2010.  Anticoagulation responsible provider: Diona Browner MD, Remi Deter.  INR POC: 2.0.  Cuvette Lot#: [id number].    Anticoagulation Management Assessment/Plan:      The target INR is 2.0-3.0.  The next INR is due 06/01/2010.  Anticoagulation instructions were given to patient.  Results were reviewed/authorized by Vashti Hey, RN.  He was notified by Vashti Hey RN.         Prior Anticoagulation Instructions: INR 1.6 Increase coumadin to 5mg  once daily except 7.5mg  on Mondays, Wednesdays and Fridays Appt with Dr Renard Matter in am for gout.  To call if started on med that will interfere with coumadin  Current Anticoagulation Instructions: INR 2.0 Continue coumadin 5mg  once daily except 7.5 on Mondays, Wednesdays and Fridays

## 2010-12-11 NOTE — Medication Information (Signed)
Summary: ccr-lr  Anticoagulant Therapy  Managed by: Vashti Hey, RN PCP: [name] Supervising MD: Diona Browner MD, Remi Deter Indication 1: Aortic Valve Replacement Valve Type: Bioprosthetic Valve Position: Aortic Lab Used: [facility] [place] Site: [place] INR POC 1.6  Dietary changes: no    Health status changes: no    Bleeding/hemorrhagic complications: no    Recent/future hospitalizations: no    Any changes in medication regimen? no    Recent/future dental: no  Any missed doses?: yes     Details: Might have missed Sunday  Is patient compliant with meds? yes       Allergies: 1)  ! * Diclofenac  Anticoagulation Management History:      The patient is taking warfarin and comes in today for a routine follow up visit.  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  Negative risk factors for bleeding include an age less than 23 years old, no history of CVA/TIA, no history of GI bleeding, and absence of serious comorbidities.  The bleeding index is 'low risk'.  Negative CHADS2 values include History of CHF, History of HTN, Age > 59 years old, History of Diabetes, and Prior Stroke/CVA/TIA.  The start date was 05/06/2010.  Anticoagulation responsible provider: Diona Browner MD, Remi Deter.  INR POC: 1.6.  Cuvette Lot#: [id number].    Anticoagulation Management Assessment/Plan:      The patient's current anticoagulation dose is Warfarin sodium 5 mg tabs: Use as directed by Anticoagulation Clinic.  The target INR is 2.0-3.0.  The next INR is due 08/06/2010.  Anticoagulation instructions were given to patient.  Results were reviewed/authorized by Vashti Hey, RN.  He was notified by Vashti Hey RN.         Prior Anticoagulation Instructions: INR 2.1 Continue coumadin 7.5mg  once daily except 5mg  on Thursdays  Current Anticoagulation Instructions: INR 1.6 Take coumadin 2 1/2 tablets tonight, 2 tablets tomorrow night then increase dose to 1 1/2 tablets  once daily

## 2010-12-11 NOTE — Medication Information (Signed)
Summary: ccr-lr  Anticoagulant Therapy  Managed by: Vashti Hey, RN PCP: [name] Supervising MD: [name], [name] Indication 1: Aortic Valve Replacement Valve Type: Bioprosthetic Valve Position: Aortic Lab Used: [facility] [place] Site: [place] INR POC 3.1  Dietary changes: no    Health status changes: no    Bleeding/hemorrhagic complications: no    Recent/future hospitalizations: yes       Details: Scheduled for DCCV with Dr Juanda Chance on 07/09/10  Any changes in medication regimen? no    Recent/future dental: no  Any missed doses?: no       Is patient compliant with meds? yes       Allergies: 1)  ! * Diclofenac  Anticoagulation Management History:      The patient is taking warfarin and comes in today for a routine follow up visit.  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  Negative risk factors for bleeding include an age less than 66 years old, no history of CVA/TIA, no history of GI bleeding, and absence of serious comorbidities.  The bleeding index is 'low risk'.  Negative CHADS2 values include History of CHF, History of HTN, Age > 63 years old, History of Diabetes, and Prior Stroke/CVA/TIA.  The start date was 05/06/2010.  Anticoagulation responsible Ebrahim Deremer: [name], [name].  INR POC: 3.1.  Cuvette Lot#: [id number].    Anticoagulation Management Assessment/Plan:      The patient's current anticoagulation dose is Warfarin sodium 5 mg tabs: Use as directed by Anticoagulation Clinic.  The target INR is 2.0-3.0.  The next INR is due 07/11/2010.  Anticoagulation instructions were given to patient.  Results were reviewed/authorized by Vashti Hey, RN.  He was notified by Vashti Hey RN.        Coagulation management information includes: Aortic Tissue Valve 05/01/10       Scheduled for DCCV with Dr Juanda Chance on 07/09/10.  Prior Anticoagulation Instructions: INR 2.7 Continue coumadin 7.5mg  once daily   Current Anticoagulation  Instructions: INR 3.1 Decrease coumadin to 7.5mg  once daily except 5mg  on Thursdays

## 2010-12-11 NOTE — Assessment & Plan Note (Signed)
Summary: per checkout   Visit Type:  Follow-up Primary Auther Lyerly:  [name]  CC:  what physical actiitie can be done.  History of Present Illness: The patient is 65 years old and return for management of atrial fibrillation and valvular heart disease. On April 25, 2010 he had a posterior MI do to emboli to his circumflex artery. This was complicated by V. fib arrest in the catheter lab. He also had severe aortic stenosis. He was also found to have a large thrombus in the left atrium which we thought was the source of his embolus. He underwent surgery with aortic valve replacement with a tissue valve and he had removal of the thrombus and oversewing of the left atrial appendage and a Maze procedure.  When I saw him last time he was back in nature fibrillation. We continued him on Coumadin amiodarone with plans to followup here.  Apart from his embolus we did not identify any significant coronary disease so we have classified him as primary prevention rather than secondary prevention and he is not on a statin. His lipid profile on statin was pretty good with an LDL of 97.  Current Medications (verified): 1)  Amiodarone Hcl 200 Mg Tabs (Amiodarone Hcl) .... Take One Tablet By Mouth Once Daily 2)  Aspirin 81 Mg Tbec (Aspirin) .... Take One Tablet By Mouth Daily 3)  Metoprolol Succinate 25 Mg Xr24h-Tab (Metoprolol Succinate) .... Take One- Half  Tablet By Mouth Daily 4)  Warfarin Sodium 5 Mg Tabs (Warfarin Sodium) .... Use As Directed By Anticoagulation Clinic 5)  Albuterol Inhaler .... One To Two Puffs By Mouth Every 4 Hours As Needed 6)  Allopurinol 100 Mg Tabs (Allopurinol) .... Take One Tab By Mouth Once Daily  Allergies (verified): 1)  ! * Diclofenac  Past History:  Past Medical History: Reviewed history from 05/23/2010 and no changes required.  1. Acute myocardial infarction.   2. Ventricular fibrillation arrest.   3. Critical aortic stenosis.   4. Atrial fibrillation.   5.  Intraatrial clot.   6. History of gouty arthritis.   7. History of pneumonia.   8. History of diverticulosis.   9. Postoperative acute blood loss anemia.   10.Postoperative hypotension.      Review of Systems       ROS is negative except as outlined in HPI.   Vital Signs:  Patient profile:   65 year old male Height:      70 inches Weight:      171 pounds BMI:     24.62 Pulse rate:   83 / minute BP sitting:   112 / 72  (left arm)  Vitals Entered By: Burnett Kanaris, CNA (June 28, 2010 9:15 AM)  Physical Exam  Additional Exam:  Gen. Well-nourished, in no distress   Neck: No JVD, thyroid not enlarged, no carotid bruits Lungs: No tachypnea, clear without rales, rhonchi or wheezes Cardiovascular: Rhythm irregular, PMI not displaced,  heart sounds  normal, no murmurs or gallops, no peripheral edema, pulses normal in all 4 extremities. Abdomen: BS normal, abdomen soft and non-tender without masses or organomegaly, no hepatosplenomegaly. MS: No deformities, no cyanosis or clubbing   Neuro:  No focal sns   Skin:  no lesions    Impression & Recommendations:  Problem # 1:  AORTIC VALVE DISORDERS (ICD-424.1) He is status post aortic valve replacement for aortic stenosis with a tissue valve. He also had left atrial thrombectomy and a Maze procedure. This is stable. His updated  medication list for this problem includes:    Metoprolol Succinate 25 Mg Xr24h-tab (Metoprolol succinate) .Marland Kitchen... Take one- half  tablet by mouth daily  Problem # 2:  ATRIAL FIBRILLATION (ICD-427.31)  He has recurrent atrial fibrillation and is in atrial fibrillation today. He had a Maze procedure at the time of surgery and is currently on amiodarone and Coumadin. His INRs have been therapeutic beginning July 28. We will plan to get repeat INR next week in [place] and schedule DC cardioversion the following week. I discussed the procedure and risks with him and his wife. His updated medication list for this  problem includes:    Amiodarone Hcl 200 Mg Tabs (Amiodarone hcl) .Marland Kitchen... Take one tablet by mouth once daily    Aspirin 81 Mg Tbec (Aspirin) .Marland Kitchen... Take one tablet by mouth daily    Metoprolol Succinate 25 Mg Xr24h-tab (Metoprolol succinate) .Marland Kitchen... Take one- half  tablet by mouth daily    Warfarin Sodium 5 Mg Tabs (Warfarin sodium) ..... Use as directed by anticoagulation clinic  Orders: EKG w/ Interpretation (93000)  Problem # 3:  CAD, NATIVE VESSEL (ICD-414.01) He had a posterior MI due to an embolus to the circumflex artery. This was not reperfused a bypass but his LV function is good. This problem is stable. His updated medication list for this problem includes:    Aspirin 81 Mg Tbec (Aspirin) .Marland Kitchen... Take one tablet by mouth daily    Metoprolol Succinate 25 Mg Xr24h-tab (Metoprolol succinate) .Marland Kitchen... Take one- half  tablet by mouth daily    Warfarin Sodium 5 Mg Tabs (Warfarin sodium) ..... Use as directed by anticoagulation clinic  Patient Instructions: 1)  Your physician recommends that you schedule a follow-up appointment in: 4 weeks 2)  You will need a coumadin check next week in [place]. 3)  Your physician recommends that you return for lab work the day of your next coumadin check in [place]: bmet/cbc (427.31;414.01) 4)  Your physician has recommended that you have a cardioversion (DCCV).  Electrical cardioversion uses a jolt of electricity to your heart either through paddles or wired patches attached to your chest. This is a controlled, usually prescheduled, procedure. Defibrillation is done under light anesthesia in the hospital, and you usually go home the day of the procedure. This is done to get your heart back into a normal rhythm. You are not awake for the procedure. Please see the instruction sheet given to you today.

## 2010-12-11 NOTE — Medication Information (Signed)
Summary: ccr-lr  Anticoagulant Therapy  Managed by: Vashti Hey, RN PCP: [name] Supervising MD: [name], [name] Indication 1: Aortic Valve Replacement Valve Type: Bioprosthetic Valve Position: Aortic Lab Used: [facility] [place] Site: [place] INR POC 2.7           Allergies: 1)  ! * Diclofenac  Anticoagulation Management History:      The patient is taking warfarin and comes in today for a routine follow up visit.  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  Negative risk factors for bleeding include an age less than 71 years old, no history of CVA/TIA, no history of GI bleeding, and absence of serious comorbidities.  The bleeding index is 'low risk'.  Negative CHADS2 values include History of CHF, History of HTN, Age > 32 years old, History of Diabetes, and Prior Stroke/CVA/TIA.  The start date was 05/06/2010.  Anticoagulation responsible provider: [name], [name].  INR POC: 2.7.  Cuvette Lot#: [id number].    Anticoagulation Management Assessment/Plan:      The patient's current anticoagulation dose is Warfarin sodium 5 mg tabs: Use as directed by Anticoagulation Clinic.  The target INR is 2.0-3.0.  The next INR is due 07/18/2010.  Anticoagulation instructions were given to patient.  Results were reviewed/authorized by Vashti Hey, RN.  He was notified by Vashti Hey RN.        Coagulation management information includes: Aortic Tissue Valve 05/01/10       Had TEE cardioversion 8/29 with Dr Juanda Chance.  Prior Anticoagulation Instructions: INR 3.1 Decrease coumadin to 7.5mg  once daily except 5mg  on Thursdays  Current Anticoagulation Instructions: INR 2.7 Continue coumadin 7.5mg  once daily except 5mg  on Thursdays

## 2010-12-11 NOTE — Consult Note (Signed)
[facility]: [facility]   Imported By: Roderic Ovens 05/26/2010 10:42:37  _____________________________________________________________________  External Attachment:    Type:   Image     Comment:   External Document

## 2010-12-11 NOTE — Letter (Signed)
Summary: [facility]  [facility]   Imported By: Earl Many 07/30/2010 11:17:44  _____________________________________________________________________  External Attachment:    Type:   Image     Comment:   External Document

## 2010-12-11 NOTE — Letter (Signed)
Summary: Cardioversion/TEE Instructions  Architectural technologist, Main Office  [address]   [place], [place] [address]   Phone: [phone]  Fax: [phone]    Cardioversion / TEE Cardioversion Instructions  10/01/2010 MRN: [id number]  DRAYK HUMBARGER [address] [place], [place]  [address]  Dear Mr. LOTT, You are scheduled for a Cardioversion on Wed. 10/10/10 with Dr. Juanda Chance.   Please arrive at the [facility] of [facility] at 11:00 a.m. (prodcedure to start at 1:00pm) on the day of your procedure.  1)   DIET:  A)   Nothing to eat or drink after midnight except your medications with a sip of water.   2)   Come to the [place] office on ___(n/a)_________________ for lab work. The lab at [facility] is open from 8:30 a.m. to 1:30 p.m. and 2:30 p.m. to 5:00 p.m. The lab at [facility] is open from 7:30 a.m. to 5:30 p.m. You do not have to be fasting.  3)   MAKE SURE YOU TAKE YOUR COUMADIN.  4)   A)   DO NOT TAKE these medications before your procedure:      ___________________________________________________________________     ___________________________________________________________________     ___________________________________________________________________  B)   YOU MAY TAKE ALL of your remaining medications with a small amount of water.    C)   START NEW medications:       ___________________________________________________________________     ___________________________________________________________________  5)  Must have a responsible person to drive you home.  6)   Bring a current list of your medications and current insurance cards.   * Special Note:  Every effort is made to have your procedure done on time. Occasionally there are emergencies that present themselves at the hospital that may cause delays. Please be patient if a delay does occur.  * If you have any questions after you get home, please call the  office at [phone].

## 2010-12-11 NOTE — Medication Information (Signed)
Summary: ccr-lr  Anticoagulant Therapy  Managed by: Vashti Hey, RN PCP: [name] Supervising MD: Dietrich Pates MD, Molly Maduro Indication 1: Aortic Valve Replacement Valve Type: Bioprosthetic Valve Position: Aortic Lab Used: [facility] [place] Site:  INR POC 2.7  Dietary changes: no    Health status changes: no    Bleeding/hemorrhagic complications: no    Recent/future hospitalizations: no    Any changes in medication regimen? no    Recent/future dental: no  Any missed doses?: no       Is patient compliant with meds? yes       Allergies: 1)  ! * Diclofenac  Anticoagulation Management History:      The patient is taking warfarin and comes in today for a routine follow up visit.  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  Negative risk factors for bleeding include an age less than 41 years old, no history of CVA/TIA, no history of GI bleeding, and absence of serious comorbidities.  The bleeding index is 'low risk'.  Negative CHADS2 values include History of CHF, History of HTN, Age > 25 years old, History of Diabetes, and Prior Stroke/CVA/TIA.  The start date was 05/06/2010.  Anticoagulation responsible provider: Dietrich Pates MD, Molly Maduro.  INR POC: 2.7.  Cuvette Lot#: [id number].    Anticoagulation Management Assessment/Plan:      The patient's current anticoagulation dose is Warfarin sodium 5 mg tabs: Use as directed by Anticoagulation Clinic.  The target INR is 2.0-3.0.  The next INR is due 08/13/2010.  Anticoagulation instructions were given to patient.  Results were reviewed/authorized by Vashti Hey, RN.  He was notified by Vashti Hey RN.         Prior Anticoagulation Instructions: INR 1.6 Take coumadin 2 1/2 tablets tonight, 2 tablets tomorrow night then increase dose to 1 1/2 tablets once daily    Current Anticoagulation Instructions: INR 2.7 Continue coumadin 7.5mg  once daily

## 2010-12-11 NOTE — Medication Information (Signed)
Summary: ccr-lr  Anticoagulant Therapy  Managed by: Vashti Hey, RN PCP: [name] Supervising MD: Diona Browner MD, Remi Deter Indication 1: Aortic Valve Replacement Valve Type: Bioprosthetic Valve Position: Aortic Lab Used: [facility] [place] Site: [place] INR POC 2.1  Dietary changes: no    Health status changes: no    Bleeding/hemorrhagic complications: no    Recent/future hospitalizations: no    Any changes in medication regimen? no    Recent/future dental: no  Any missed doses?: no       Is patient compliant with meds? yes       Allergies: 1)  ! * Diclofenac  Anticoagulation Management History:      The patient is taking warfarin and comes in today for a routine follow up visit.  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  Negative risk factors for bleeding include an age less than 84 years old, no history of CVA/TIA, no history of GI bleeding, and absence of serious comorbidities.  The bleeding index is 'low risk'.  Negative CHADS2 values include History of CHF, History of HTN, Age > 60 years old, History of Diabetes, and Prior Stroke/CVA/TIA.  The start date was 05/06/2010.  Anticoagulation responsible provider: Diona Browner MD, Remi Deter.  INR POC: 2.1.  Cuvette Lot#: [id number].    Anticoagulation Management Assessment/Plan:      The patient's current anticoagulation dose is Warfarin sodium 5 mg tabs: Use as directed by Anticoagulation Clinic.  The target INR is 2.0-3.0.  The next INR is due 06/13/2010.  Anticoagulation instructions were given to patient.  Results were reviewed/authorized by Vashti Hey, RN.  He was notified by Vashti Hey RN.         Prior Anticoagulation Instructions: INR 1.8 Increase coumadin to 7.5mg  once daily except 5mg  on Tuesdays  Current Anticoagulation Instructions: INR 2.1 Increase coumadin to 7.5mg  once daily  Prescriptions: WARFARIN SODIUM 5 MG TABS (WARFARIN SODIUM) Use as directed by Anticoagulation  Clinic  #60 x 3   Entered by:   Vashti Hey RN   Authorized by:   Lenoria Farrier, MD, [facility]   Signed by:   Vashti Hey RN on 06/07/2010   Method used:   Electronically to        [facility]* (retail)       [address]       [place], [place]  [address]       Ph: [phone] or [phone]       Fax: [phone]   RxID:   [id number]

## 2010-12-11 NOTE — Medication Information (Signed)
Summary: ccr-lr  Anticoagulant Therapy  Managed by: Vashti Hey, RN PCP: [name] Supervising MD: Dietrich Pates MD, Molly Maduro Indication 1: Aortic Valve Replacement Valve Type: Bioprosthetic Valve Position: Aortic Lab Used: [facility] [place] Site: [place] INR POC 2.4  Dietary changes: no    Health status changes: no    Bleeding/hemorrhagic complications: no    Recent/future hospitalizations: no    Any changes in medication regimen? no    Recent/future dental: no  Any missed doses?: no       Is patient compliant with meds? yes       Allergies: 1)  ! * Diclofenac  Anticoagulation Management History:      The patient is taking warfarin and comes in today for a routine follow up visit.  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  Positive risk factors for bleeding include an age of 66 years or older.  Negative risk factors for bleeding include no history of CVA/TIA, no history of GI bleeding, and absence of serious comorbidities.  The bleeding index is 'intermediate risk'.  Negative CHADS2 values include History of CHF, History of HTN, Age > 18 years old, History of Diabetes, and Prior Stroke/CVA/TIA.  The start date was 05/06/2010.  Anticoagulation responsible provider: Dietrich Pates MD, Molly Maduro.  INR POC: 2.4.  Cuvette Lot#: [id number].    Anticoagulation Management Assessment/Plan:      The patient's current anticoagulation dose is Warfarin sodium 5 mg tabs: Use as directed by Anticoagulation Clinic.  The target INR is 2.0-3.0.  The next INR is due 10/22/2010.  Anticoagulation instructions were given to patient.  Results were reviewed/authorized by Vashti Hey, RN.  He was notified by Vashti Hey RN.         Prior Anticoagulation Instructions: INR 3.0 Continue coumadin 7.5mg  once daily except 5mg  on Thursdays  Current Anticoagulation Instructions: INR 2.4 Continue coumadin 7.5mg  once daily except 5mg  on Thursdays

## 2010-12-11 NOTE — Medication Information (Signed)
Summary: ccr-lr  Anticoagulant Therapy  Managed by: Vashti Hey, RN Supervising MD: Eden Emms MD, Theron Arista Indication 1: Aortic Valve Replacement Valve Type: Bioprosthetic Valve Position: Aortic Lab Used: [facility] [place] Site: [place] INR POC 1.6  Dietary changes: no    Health status changes: yes       Details: gout rt ankle  Has appt with Dr Renard Matter in am  Bleeding/hemorrhagic complications: no    Recent/future hospitalizations: no    Any changes in medication regimen? no    Recent/future dental: no  Any missed doses?: no       Is patient compliant with meds? yes       Anticoagulation Management History:      The patient is taking warfarin and comes in today for a routine follow up visit.  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  Negative risk factors for bleeding include an age less than 7 years old, no history of CVA/TIA, no history of GI bleeding, and absence of serious comorbidities.  The bleeding index is 'low risk'.  Negative CHADS2 values include History of CHF, History of HTN, Age > 46 years old, History of Diabetes, and Prior Stroke/CVA/TIA.  The start date was 05/06/2010.  Anticoagulation responsible provider: Eden Emms MD, Theron Arista.  INR POC: 1.6.  Cuvette Lot#: [id number].    Anticoagulation Management Assessment/Plan:      The target INR is 2.0-3.0.  The next INR is due 05/24/2010.  Anticoagulation instructions were given to patient.  Results were reviewed/authorized by Vashti Hey, RN.  He was notified by Vashti Hey RN.         Prior Anticoagulation Instructions: INR 1.6 Take coumadin 7.5mg  tonight then resume 5mg  once daily   Current Anticoagulation Instructions: INR 1.6 Increase coumadin to 5mg  once daily except 7.5mg  on Mondays, Wednesdays and Fridays Appt with Dr Renard Matter in am for gout.  To call if started on med that will interfere with coumadin

## 2010-12-13 ENCOUNTER — Encounter (INDEPENDENT_AMBULATORY_CARE_PROVIDER_SITE_OTHER): Payer: Medicare Other

## 2010-12-13 ENCOUNTER — Encounter: Payer: Self-pay | Admitting: Cardiology

## 2010-12-13 DIAGNOSIS — I359 Nonrheumatic aortic valve disorder, unspecified: Secondary | ICD-10-CM

## 2010-12-13 DIAGNOSIS — Z7901 Long term (current) use of anticoagulants: Secondary | ICD-10-CM

## 2010-12-13 NOTE — Progress Notes (Signed)
Summary: pt would like lab results/**LM/nm  Phone Note Call from Patient Call back at Home Phone [phone]   Caller: Patient Reason for Call: Talk to Nurse, Talk to Doctor, Lab or Test Results Summary of Call: pt would like results of lab work done on friday Initial call taken by: Omer Jack,  December 04, 2010 3:52 PM  Follow-up for Phone Call        [facility].Ollen Gross, RN, BSN  December 04, 2010 5:03 PM

## 2010-12-13 NOTE — Progress Notes (Signed)
Summary: [facility] Office Visit  [facility] Office Visit   Imported By: Earl Many 11/19/2010 16:54:11  _____________________________________________________________________  External Attachment:    Type:   Image     Comment:   External Document  Appended Document: [facility] Office Visit will forward to Dr. Shirlee Latch for signature- pt to f/u with him.

## 2010-12-13 NOTE — Medication Information (Signed)
Summary: ccr-lr  Anticoagulant Therapy  Managed by: Vashti Hey, RN PCP: [name] Supervising MD: Dietrich Pates MD, Molly Maduro Indication 1: Aortic Valve Replacement Valve Type: Bioprosthetic Valve Position: Aortic Lab Used: [facility] [place] Site: [address] INR POC 2.0 INR RANGE 2.0-3.0  Dietary changes: no    Health status changes: no    Bleeding/hemorrhagic complications: no    Recent/future hospitalizations: no    Any changes in medication regimen? no    Recent/future dental: no  Any missed doses?: no       Is patient compliant with meds? yes       Allergies: 1)  ! * Diclofenac  Anticoagulation Management History:      The patient is taking warfarin and comes in today for a routine follow up visit.  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  Positive risk factors for bleeding include an age of 66 years or older.  Negative risk factors for bleeding include no history of CVA/TIA, no history of GI bleeding, and absence of serious comorbidities.  The bleeding index is 'intermediate risk'.  Negative CHADS2 values include History of CHF, History of HTN, Age > 66 years old, History of Diabetes, and Prior Stroke/CVA/TIA.  The start date was 05/06/2010.  Anticoagulation responsible Francisca Harbuck: Dietrich Pates MD, Molly Maduro.  INR POC: 2.0.  Cuvette Lot#: [id number].  Exp: 10/2011.    Anticoagulation Management Assessment/Plan:      The patient's current anticoagulation dose is Warfarin sodium 5 mg tabs: Use as directed by Anticoagulation Clinic.  The target INR is 2.0-3.0.  The next INR is due 11/08/2010.  Anticoagulation instructions were given to patient.  Results were reviewed/authorized by Vashti Hey, RN.  He was notified by Vashti Hey RN.         Prior Anticoagulation Instructions: INR 3.0  (2nd post DCCV) Continue coumadin 7.5mg  once daily except 5mg  on Mondays and Thursdays  Current Anticoagulation Instructions: INR 2.0   (3rd post  DCCV) Stopped amiodarone on 10/26/10 Increase coumadin to 7.5mg  once daily except 5mg  on Mondays

## 2010-12-13 NOTE — Letter (Signed)
Summary: Appointment - Missed  [facility]  [address] [place] [address]   Phone: [phone]  Fax: [phone]     November 21, 2010 MRN: [id number]   [name] [address] [place], [place]  [address]   Dear [name],  Our records indicate you missed your appointment on    11/21/10 [facility]                         It is very important that we reach you to reschedule this appointment. We look forward to participating in your health care needs. Please contact us at the number listed above at your earliest convenience to reschedule this appointment.     Sincerely,    Glass blower/designer

## 2010-12-13 NOTE — Progress Notes (Signed)
Summary: lab results  Phone Note Call from Patient Call back at Home Phone [phone]   Caller: Patient Reason for Call: Talk to Nurse Summary of Call: pt calling re blood work results Initial call taken by: Roe Coombs,  December 05, 2010 8:11 AM  Follow-up for Phone Call        Pt aware of lab results and that letter was mailed on Monday. Mylo Red RN  Follow-up by: Lisabeth Devoid RN,  December 05, 2010 8:16 AM

## 2010-12-13 NOTE — Assessment & Plan Note (Signed)
Summary: 1 MONTH ROV FORMER PT OF DR. Benn Moulder   Visit Type:  np / rov Primary Provider:  [name]  CC:  no complaints.  History of Present Illness: 65 yo former patient of Dr. Juanda Chance with history of atrial fibrillation, cardioembolic MI, and aortic stenosis s/p aortic valve replacement presents for regular cardiology followup.  He has been doing well since his surgery.  Dr. Juanda Chance attempted twice to cardiovert him while on amiodarone, but both attempts failed, so it was decided to stop amiodarone and follow a rate control/anticoagulation strategy.  He has been doing well.  He is in atrial flutter today.  He does not have exertional shortness of breath and is quite active.  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  No chest pain.  Occasional tachypalpitations but always brief.    ECG: atrial flutter at 94  Preventive Screening-Counseling & Management  Alcohol-Tobacco     Smoking Status: quit  Current Medications (verified): 1)  Aspirin 81 Mg Tbec (Aspirin) .... Take One Tablet By Mouth Daily 2)  Metoprolol Succinate 50 Mg Xr24h-Tab (Metoprolol Succinate) .... Take One & 1/2  Tablets By Mouth Daily 3)  Warfarin Sodium 5 Mg Tabs (Warfarin Sodium) .... Use As Directed By Anticoagulation Clinic 4)  Allopurinol 100 Mg Tabs (Allopurinol) .... Take One Tab By Mouth Once Daily 5)  Extra Strength Tylenol .... As Needed For Knee Pain  Allergies (verified): 1)  ! * Diclofenac  Past History:  Past Medical History: 1. Myocardial infarction: Patient had an inferoposterior MI in 6/11 complicated by ventricular fibrillation arrest.  LHC showed occlusion of OM1 and and left-sided PDA.  Other vessels showed no angiographic CAD.  This was thought to be embolic and was treated with heparin gtt.  He was actually found soon thereafter to have extensive left atrial thrombus. TEE (8/11) showed EF 50-55%, well-seated bioprosthetic aortic valve.  2. Ventricular fibrillation  arrest in setting of acute MI.  3. Aortic stenosis: Severe AS.  Patient had AV replacement with bioprosthetic aortic valve in 6/11.  4. Atrial fibrillation/flutter: Complicated by cardioembolic MI.  At time of aortic valve replacement in 6/11, was noted to have extensive left atrial thrombus.  Thrombus was removed and patient had Maze procedure and left atrial appendage ligation.  Atrial fibrillatoin recurred and patient had 2 cardioversions while on amiodarone, neither succeeded.  He is now off amiodarone and rate controlled with coumadin anticoagulation.  5. Gout 6. History of pneumonia.  7. Diverticulosis.      Family History: No premature CAD  Social History: Lives in [place] with his wife.  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  Rare ETOH.  Tobacco Use - Former. pt no longer smokes cigars quite in April 24, 2010 Smoking Status:  quit  Review of Systems       All systems reviewed and negative except as per HPI.   Vital Signs:  Patient profile:   65 year old male Height:      70 inches Weight:      186.50 pounds BMI:     26.86 Pulse rate:   94 / minute BP sitting:   110 / 66  (left arm) Cuff size:   regular  Vitals Entered By: Caralee Ates CMA (November 30, 2010 8:59 AM)  Physical Exam  General:  Well developed, well nourished, in no acute distress. Neck:  Neck supple, no JVD. No masses, thyromegaly or abnormal cervical nodes. Lungs:  Clear bilaterally to auscultation  and percussion. Heart:  Non-displaced PMI, chest non-tender; irregular rate and rhythm, S1, S2 without rubs or gallops. 2/6 early systolic murmur RUSB.  Carotid upstroke normal, no bruit. Pedals normal pulses. No edema, no varicosities. Abdomen:  Bowel sounds positive; abdomen soft and non-tender without masses, organomegaly, or hernias noted. No hepatosplenomegaly. Extremities:  No clubbing or cyanosis. Neurologic:  Alert and oriented x 3. Psych:  Normal affect.   Impression &  Recommendations:  Problem # 1:  ATRIAL FIBRILLATION (ICD-427.31) Patient is in atrial flutter today with HR 94.  I am going to change his Toprol XL to 50 mg two times a day for more even heart rate control.  He will continue on coumadin.    Problem # 2:  AORTIC VALVE DISORDERS (ICD-424.1) Stable status post bioprosthetic aortic valve replacement.  Plan repeat echo in 8/12, will reassess valve at that time.   Problem # 3:  CAD, NATIVE VESSEL (ICD-414.01) Patient actually did not have angiographic CAD and it was thought that his MI was embolic from left atrial thrombus.  He is doing quite well now, NYHA class I symptoms.  Will repeat echo in 8/12 for LV systolic function.  Will check lipids/LFTs.   Other Orders: TLB-BMP (Basic Metabolic Panel-BMET) (80048-METABOL) TLB-Lipid Panel (80061-LIPID) TLB-Hepatic/Liver Function Pnl (80076-HEPATIC) TLB-CBC Platelet - w/Differential (85025-CBCD) Echocardiogram (Echo)  Patient Instructions: 1)  Your physician has recommended you make the following change in your medication:  2)  Change Toprol XL(Metoprolol Succinate) to 50mg  twice a day. 3)  Your physician recommends that you return for a FASTING lipid profile/liver profile/BMP/CBC today---427.31 424.1 4)  Your physician has requested that you have an echocardiogram.  Echocardiography is a painless test that uses sound waves to create images of your heart. It provides your doctor with information about the size and shape of your heart and how well your heart's chambers and valves are working.  This procedure takes approximately one hour. There are no restrictions for this procedure. JUly 2012 5)  Your physician wants you to follow-up in: 6 months with Dr Harmon Dun 2012) You will receive a reminder letter in the mail two months in advance. If you don't receive a letter, please call our office to schedule the follow-up appointment. Prescriptions: METOPROLOL SUCCINATE 50 MG XR24H-TAB (METOPROLOL  SUCCINATE) Take one tablet twice a day  #60 x 11   Entered by:   Katina Dung, RN, BSN   Authorized by:   Marca Ancona, MD   Signed by:   Katina Dung, RN, BSN on 11/30/2010   Method used:   Electronically to        [facility] (retail)       [address]       [place], [place]  [address]       Ph: [phone] or [phone]       Fax: [phone]   RxID:   [id number]

## 2010-12-13 NOTE — Medication Information (Signed)
Summary: [facility]  Anticoagulant Therapy  Managed by: Vashti Hey, RN PCP: [name] Supervising MD: Diona Browner MD, Remi Deter Indication 1: Aortic Valve Replacement Valve Type: Bioprosthetic Valve Position: Aortic Lab Used: [facility] [place] Site: [address] INR POC 1.4 INR RANGE 2.0-3.0  Dietary changes: no    Health status changes: no    Bleeding/hemorrhagic complications: no    Recent/future hospitalizations: no    Any changes in medication regimen? no    Recent/future dental: no  Any missed doses?: no       Is patient compliant with meds? yes       Allergies: 1)  ! * Diclofenac  Anticoagulation Management History:      The patient is taking warfarin and comes in today for a routine follow up visit.  Anticoagulation is being administered due to a tissue prosthetic heart valve.  Positive risk factors for bleeding include an age of 66 years or older.  Negative risk factors for bleeding include no history of CVA/TIA, no history of GI bleeding, and absence of serious comorbidities.  The bleeding index is 'intermediate risk'.  Negative CHADS2 values include History of CHF, History of HTN, Age > 10 years old, History of Diabetes, and Prior Stroke/CVA/TIA.  The start date was 05/06/2010.  Anticoagulation responsible provider: Diona Browner MD, Remi Deter.  INR POC: 1.4.  Cuvette Lot#: [id number].  Exp: 10/2011.    Anticoagulation Management Assessment/Plan:      The patient's current anticoagulation dose is Warfarin sodium 5 mg tabs: Use as directed by Anticoagulation Clinic.  The target INR is 2.0-3.0.  The next INR is due 12/05/2010.  Anticoagulation instructions were given to patient.  Results were reviewed/authorized by Vashti Hey, RN.  He was notified by Vashti Hey RN.         Prior Anticoagulation Instructions: INR 1.6 Take coumadin 2 1/2 tablets tonight then increase dose to 7.5mg  once daily except 10mg  on Mondays and Thursdays  Current Anticoagulation  Instructions: INR 1.4 Amiodarone was d/c'd Increase coumadin to 2 tablets once daily

## 2010-12-13 NOTE — Medication Information (Signed)
Summary: ccr-lr  Anticoagulant Therapy  Managed by: Vashti Hey, RN PCP: [name] Supervising MD: Dietrich Pates MD, Molly Maduro Indication 1: Aortic Valve Replacement Valve Type: Bioprosthetic Valve Position: Aortic Lab Used: [facility] [place] Site: [address] INR POC 1.6 INR RANGE 2.0-3.0  Dietary changes: no    Health status changes: no    Bleeding/hemorrhagic complications: no    Recent/future hospitalizations: no    Any changes in medication regimen? no    Recent/future dental: no  Any missed doses?: no       Is patient compliant with meds? yes       Allergies: 1)  ! * Diclofenac  Anticoagulation Management History:      The patient is taking warfarin and comes in today for a routine follow up visit.  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  Positive risk factors for bleeding include an age of 66 years or older.  Negative risk factors for bleeding include no history of CVA/TIA, no history of GI bleeding, and absence of serious comorbidities.  The bleeding index is 'intermediate risk'.  Negative CHADS2 values include History of CHF, History of HTN, Age > 64 years old, History of Diabetes, and Prior Stroke/CVA/TIA.  The start date was 05/06/2010.  Anticoagulation responsible provider: Dietrich Pates MD, Molly Maduro.  INR POC: 1.6.  Cuvette Lot#: [id number].  Exp: 10/2011.    Anticoagulation Management Assessment/Plan:      The patient's current anticoagulation dose is Warfarin sodium 5 mg tabs: Use as directed by Anticoagulation Clinic.  The target INR is 2.0-3.0.  The next INR is due 11/21/2010.  Anticoagulation instructions were given to patient.  Results were reviewed/authorized by Vashti Hey, RN.        Coagulation management information includes: Aortic Tissue Valve 05/01/10     TEE/DCCV by Dr Juanda Chance on 10/10/10    10/26/10 amiodarone d/c'd.  Prior Anticoagulation Instructions: INR 2.0   (3rd post DCCV) Stopped amiodarone on  10/26/10 Increase coumadin to 7.5mg  once daily except 5mg  on Mondays  Current Anticoagulation Instructions: INR 1.6 Take coumadin 2 1/2 tablets tonight then increase dose to 7.5mg  once daily except 10mg  on Mondays and Thursdays

## 2010-12-13 NOTE — Miscellaneous (Signed)
Summary: [facility] Report   [facility] Report   Imported By: Roderic Ovens 11/29/2010 15:14:55  _____________________________________________________________________  External Attachment:    Type:   Image     Comment:   External Document

## 2010-12-13 NOTE — Letter (Signed)
Summary: Custom - Lipid  [facility], Main Office  [address]   [place], [place] [address]   Phone: [phone]  Fax: [phone]     December 03, 2010 MRN: [id number]   [name] [address] [place], [place]  [address]   Dear [name],  Dr Shirlee Latch has  reviewed your cholesterol results.  They are as follows:     Total Cholesterol:    153 (Desirable: less than 200)       HDL  Cholesterol:     71.20  (Desirable: greater than 40 for men and 50 for women)       LDL Cholesterol:       72  (Desirable: less than 100 for low risk and less than 70 for moderate to high risk)       Triglycerides:       48.0  (Desirable: less than 150)  Dr Shirlee Latch does not have any new recommendations.   Call our office at the number listed above if you have any questions.  Lowering your LDL cholesterol is important, but it is only one of a large number of "risk factors" that may indicate that you are at risk for heart disease, stroke or other complications of hardening of the arteries.  Other risk factors include:   A.  Cigarette Smoking* B.  High Blood Pressure* C.  Obesity* D.   Low HDL Cholesterol (see yours above)* E.   Diabetes Mellitus (higher risk if your is uncontrolled) F.  Family history of premature heart disease G.  Previous history of stroke or cardiovascular disease    *These are risk factors YOU HAVE CONTROL OVER.  For more information, visit .  There is now evidence that lowering the TOTAL CHOLESTEROL AND LDL CHOLESTEROL can reduce the risk of heart disease.  The American Heart Association recommends the following guidelines for the treatment of elevated cholesterol:  1.  If there is now current heart disease and less than two risk factors, TOTAL CHOLESTEROL should be less than 200 and LDL CHOLESTEROL should be less than 100. 2.  If there is current heart disease or two or more risk factors, TOTAL CHOLESTEROL should be less than 200 and LDL CHOLESTEROL  should be less than 70.  A diet low in cholesterol, saturated fat, and calories is the cornerstone of treatment for elevated cholesterol.  Cessation of smoking and exercise are also important in the management of elevated cholesterol and preventing vascular disease.  Studies have shown that 30 to 60 minutes of physical activity most days can help lower blood pressure, lower cholesterol, and keep your weight at a healthy level.  Drug therapy is used when cholesterol levels do not respond to therapeutic lifestyle changes (smoking cessation, diet, and exercise) and remains unacceptably high.  If medication is started, it is important to have you levels checked periodically to evaluate the need for further treatment options.  Thank you,    Luana Shu

## 2010-12-13 NOTE — Medication Information (Signed)
Summary: ccr-lr  Anticoagulant Therapy  Managed by: Vashti Hey, RN PCP: [name] Supervising MD: Eden Emms MD, Theron Arista Indication 1: Aortic Valve Replacement Valve Type: Bioprosthetic Valve Position: Aortic Lab Used: [facility] [place] Site: [address] INR POC 3.0 INR RANGE 2.0-3.0  Dietary changes: no    Health status changes: no    Bleeding/hemorrhagic complications: no    Recent/future hospitalizations: no    Any changes in medication regimen? no    Recent/future dental: no  Any missed doses?: no       Is patient compliant with meds? yes       Allergies: 1)  ! * Diclofenac  Anticoagulation Management History:      The patient is taking warfarin and comes in today for a routine follow up visit.  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  Positive risk factors for bleeding include an age of 66 years or older.  Negative risk factors for bleeding include no history of CVA/TIA, no history of GI bleeding, and absence of serious comorbidities.  The bleeding index is 'intermediate risk'.  Negative CHADS2 values include History of CHF, History of HTN, Age > 41 years old, History of Diabetes, and Prior Stroke/CVA/TIA.  The start date was 05/06/2010.  Anticoagulation responsible provider: Eden Emms MD, Theron Arista.  INR POC: 3.0.  Cuvette Lot#: [id number].  Exp: 10/2011.    Anticoagulation Management Assessment/Plan:      The patient's current anticoagulation dose is Warfarin sodium 5 mg tabs: Use as directed by Anticoagulation Clinic.  The target INR is 2.0-3.0.  The next INR is due 10/31/2010.  Anticoagulation instructions were given to patient.  Results were reviewed/authorized by Vashti Hey, RN.  He was notified by Vashti Hey RN.         Prior Anticoagulation Instructions: INR 3.2  (1st post DCCV) Decrease coumadin to 7.5mg  once daily except 5mg  on Mondays and Thursdays  Current Anticoagulation Instructions: INR 3.0  (2nd post  DCCV) Continue coumadin 7.5mg  once daily except 5mg  on Mondays and Thursdays

## 2010-12-13 NOTE — Assessment & Plan Note (Signed)
Summary: 3wk f/u   Visit Type:  Follow-up Primary Provider:  [name]   History of Present Illness: [name] is 65 years old and return for management of valvular heart disease and atrial fibrillation after his recent cardioversion..  In June he was hospitalized with a posterior MI complicated by V. fib in the catheter lab do to emboli to the circumflex artery. He also had severe aortic stenosis. 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  On followup he develop recurrent atrial fibrillation was started on amiodarone and underwent a DC cardioversion on July 09, 2010.  He developed recurrent atrial fibrillation and underwent a second cardioversion on October 10, 2010.  He has done quite well since that time has had no chest pain, shortness of breath or palpitations. Unfortunately he is back in nature fibrillation today. He could tell this only by checking his pulse.    Current Medications (verified): 1)  Amiodarone Hcl 200 Mg Tabs (Amiodarone Hcl) .... Take One Tablet By Mouth Once Daily 2)  Aspirin 81 Mg Tbec (Aspirin) .... Take One Tablet By Mouth Daily 3)  Metoprolol Succinate 25 Mg Xr24h-Tab (Metoprolol Succinate) .... Take One- Half  Tablet By Mouth Daily 4)  Warfarin Sodium 5 Mg Tabs (Warfarin Sodium) .... Use As Directed By Anticoagulation Clinic 5)  Allopurinol 100 Mg Tabs (Allopurinol) .... Take One Tab By Mouth Once Daily 6)  Extra Strength Tylenol .... As Needed For Knee Pain  Allergies: 1)  ! * Diclofenac  Past History:  Past Medical History: Reviewed history from 05/23/2010 and no changes required.  1. Acute myocardial infarction.   2. Ventricular fibrillation arrest.   3. Critical aortic stenosis.   4. Atrial fibrillation.   5. Intraatrial clot.   6. History of gouty arthritis.   7.  History of pneumonia.   8. History of diverticulosis.   9. Postoperative acute blood loss anemia.   10.Postoperative hypotension.      Review of Systems       ROS is negative except as outlined in HPI.   Vital Signs:  Patient profile:   65 year old male Height:      70 inches Weight:      184 pounds BMI:     26.50 Pulse rate:   94 / minute BP sitting:   114 / 60  (left arm)  Vitals Entered By: Laurance Flatten CMA (October 26, 2010 11:20 AM)  Physical Exam  Additional Exam:  Gen. Well-nourished, in no distress   Neck: No JVD, thyroid not enlarged, no carotid bruits Lungs: No tachypnea, clear without rales, rhonchi or wheezes Cardiovascular: Rhythm irregular, PMI not displaced,  heart sounds  normal, no murmurs or gallops, no peripheral edema, pulses normal in all 4 extremities. Abdomen: BS normal, abdomen soft and non-tender without masses or organomegaly, no hepatosplenomegaly. MS: No deformities, no cyanosis or clubbing   Neuro:  No focal sns   Skin:  no lesions    Impression & Recommendations:  Problem # 1:  ATRIAL FIBRILLATION (ICD-427.31)  Unfortunately he is back in atrial fibrillation.  He has had a previous maze procedure and has been refractory to amiodarone and 2 subsequent cardioversions. Since he is minimally symptomatic I think the best course is to settle for atrial fibrillation. He also would not be a good candidate for ablation. This measn  that he will probably need to commit to long-term Coumadin therapy since he has had a previous embolus. We had previously thought that  if we get him back in sinus rhythm, we might be able to get him off Coumadin because he has had his left atrial appendage removed.  We will discontinue the amiodarone and increase the metoprolol as outlined in the discharge instructions. I'll have him see Dr. Shirlee Latch back in followup and arrange a follow up earlier than usual so we can be sure he has adequate rate control on his metoprolol dosage.  After that his follow up we'll not need to be frequent. He lives in [place] but does not have a problem coming to [facility] for followup visits. The following medications were removed from the medication list:    Amiodarone Hcl 200 Mg Tabs (Amiodarone hcl) .Marland Kitchen... Take one tablet by mouth once daily His updated medication list for this problem includes:    Aspirin 81 Mg Tbec (Aspirin) .Marland Kitchen... Take one tablet by mouth daily    Metoprolol Succinate 50 Mg Xr24h-tab (Metoprolol succinate) .Marland Kitchen... Take one & 1/2  tablets by mouth daily    Warfarin Sodium 5 Mg Tabs (Warfarin sodium) ..... Use as directed by anticoagulation clinic  Orders: EKG w/ Interpretation (93000)  Problem # 2:  AORTIC VALVE DISORDERS (ICD-424.1) He is status post aortic valve replacement with a tissue valve for aortic stenosis. This pelvis stable. His updated medication list for this problem includes:    Metoprolol Succinate 50 Mg Xr24h-tab (Metoprolol succinate) .Marland Kitchen... Take one & 1/2  tablets by mouth daily  Problem # 3:  CAD, NATIVE VESSEL (ICD-414.01) He had a posterior MI related to an embolus from a thrombus in his left atrial appendage. His coronaries otherwise looked good so I don't think he needs secondary prevention for CAD. He is on Coumadin but not aspirin. His updated medication list for this problem includes:    Aspirin 81 Mg Tbec (Aspirin) .Marland Kitchen... Take one tablet by mouth daily    Metoprolol Succinate 50 Mg Xr24h-tab (Metoprolol succinate) .Marland Kitchen... Take one & 1/2  tablets by mouth daily    Warfarin Sodium 5 Mg Tabs (Warfarin sodium) ..... Use as directed by anticoagulation clinic  Patient Instructions: 1)  Stop amiodarone. 2)  Increase metoprolol succ to 25mg  one tab by mouth once daily x 5days. 3)  Then increase metoprolol succ to 50mg  one tab by mouth once daily x 5 days. 4)  Then increase metoprolol succ to 50mg  one & 1/2 tabs by mouth once daily. 5)  Your physician recommends that you schedule a follow-up  appointment in: 1 month with Dr. Shirlee Latch. Prescriptions: METOPROLOL SUCCINATE 50 MG XR24H-TAB (METOPROLOL SUCCINATE) Take one & 1/2  tablets by mouth daily  #45 x 11   Entered by:   Sherri Rad, RN, BSN   Authorized by:   Lenoria Farrier, MD, [place]   Signed by:   Sherri Rad, RN, BSN on 10/26/2010   Method used:   Electronically to        [facility] (retail)       [address]       [place], [place]  [address]       Ph: [phone] or [phone]       Fax: [phone]   RxID:   [id number]

## 2010-12-13 NOTE — Medication Information (Signed)
Summary: ccr-lr  Anticoagulant Therapy  Managed by: Vashti Hey, RN PCP: [name] Supervising MD: Diona Browner MD, Remi Deter Indication 1: Aortic Valve Replacement Valve Type: Bioprosthetic Valve Position: Aortic Lab Used: [facility] [place] Site: [address] INR POC 2.1 INR RANGE 2.0-3.0  Dietary changes: no    Health status changes: no    Bleeding/hemorrhagic complications: no    Recent/future hospitalizations: no    Any changes in medication regimen? no    Recent/future dental: no  Any missed doses?: no       Is patient compliant with meds? yes       Allergies: 1)  ! * Diclofenac  Anticoagulation Management History:      The patient is taking warfarin and comes in today for a routine follow up visit.  Anticoagulation is being administered due to a tissue prosthetic heart valve.  Positive risk factors for bleeding include an age of 68 years or older.  Negative risk factors for bleeding include no history of CVA/TIA, no history of GI bleeding, and absence of serious comorbidities.  The bleeding index is 'intermediate risk'.  Negative CHADS2 values include History of CHF, History of HTN, Age > 66 years old, History of Diabetes, and Prior Stroke/CVA/TIA.  The start date was 05/06/2010.  Anticoagulation responsible provider: Diona Browner MD, Remi Deter.  INR POC: 2.1.  Cuvette Lot#: [id number].  Exp: 10/2011.    Anticoagulation Management Assessment/Plan:      The patient's current anticoagulation dose is Warfarin sodium 5 mg tabs: Use as directed by Anticoagulation Clinic.  The target INR is 2.0-3.0.  The next INR is due 12/13/2010.  Anticoagulation instructions were given to patient.  Results were reviewed/authorized by Vashti Hey, RN.  He was notified by Vashti Hey RN.         Prior Anticoagulation Instructions: INR 1.4 Amiodarone was d/c'd Increase coumadin to 2 tablets once daily   Current Anticoagulation Instructions: INR 2.1 Continue coumadin 10mg  once daily

## 2010-12-19 NOTE — Medication Information (Signed)
Summary: ccr-lr  Anticoagulant Therapy  Managed by: Vashti Hey, RN PCP: [name] Supervising MD: Dietrich Pates MD, Molly Maduro Indication 1: Aortic Valve Replacement Valve Type: Bioprosthetic Valve Position: Aortic Lab Used: [facility] [place] Site: [address] INR POC 3.4 INR RANGE 2.0-3.0  Dietary changes: no    Health status changes: no    Bleeding/hemorrhagic complications: no    Recent/future hospitalizations: no    Any changes in medication regimen? no    Recent/future dental: no  Any missed doses?: no       Is patient compliant with meds? yes       Allergies: 1)  ! * Diclofenac  Anticoagulation Management History:      The patient is taking warfarin and comes in today for a routine follow up visit.  Anticoagulation is being administered due to a tissue prosthetic heart valve.  Positive risk factors for bleeding include an age of 63 years or older.  Negative risk factors for bleeding include no history of CVA/TIA, no history of GI bleeding, and absence of serious comorbidities.  The bleeding index is 'intermediate risk'.  Negative CHADS2 values include History of CHF, History of HTN, Age > 17 years old, History of Diabetes, and Prior Stroke/CVA/TIA.  The start date was 05/06/2010.  Anticoagulation responsible provider: Dietrich Pates MD, Molly Maduro.  INR POC: 3.4.  Cuvette Lot#: [id number].  Exp: 10/2011.    Anticoagulation Management Assessment/Plan:      The patient's current anticoagulation dose is Warfarin sodium 5 mg tabs: Use as directed by Anticoagulation Clinic.  The target INR is 2.0-3.0.  The next INR is due 12/31/2010.  Anticoagulation instructions were given to patient.  Results were reviewed/authorized by Vashti Hey, RN.  He was notified by Vashti Hey RN.         Prior Anticoagulation Instructions: INR 2.1 Continue coumadin 10mg  once daily   Current Anticoagulation Instructions: INR 3.4 Decrease coumadin to 10mg  once daily except 5mg  on Thursdays

## 2010-12-31 ENCOUNTER — Encounter (INDEPENDENT_AMBULATORY_CARE_PROVIDER_SITE_OTHER): Payer: Medicare Other

## 2010-12-31 ENCOUNTER — Encounter: Payer: Self-pay | Admitting: Cardiovascular Disease

## 2010-12-31 DIAGNOSIS — Z7901 Long term (current) use of anticoagulants: Secondary | ICD-10-CM

## 2010-12-31 DIAGNOSIS — I4891 Unspecified atrial fibrillation: Secondary | ICD-10-CM

## 2010-12-31 LAB — CONVERTED CEMR LAB: POC INR: 2.8

## 2011-01-01 DIAGNOSIS — I4891 Unspecified atrial fibrillation: Secondary | ICD-10-CM

## 2011-01-07 ENCOUNTER — Encounter (INDEPENDENT_AMBULATORY_CARE_PROVIDER_SITE_OTHER): Payer: Medicare Other

## 2011-01-07 ENCOUNTER — Encounter: Payer: Self-pay | Admitting: Cardiology

## 2011-01-07 DIAGNOSIS — I4891 Unspecified atrial fibrillation: Secondary | ICD-10-CM

## 2011-01-07 DIAGNOSIS — Z7901 Long term (current) use of anticoagulants: Secondary | ICD-10-CM

## 2011-01-07 LAB — CONVERTED CEMR LAB: POC INR: 2.1

## 2011-01-08 NOTE — Medication Information (Signed)
Summary: ccr-lr  Anticoagulant Therapy  Managed by: Vashti Hey, RN PCP: [name] Supervising MD: Clifton James MD, Cristal Deer Indication 1: Aortic Valve Replacement Valve Type: Bioprosthetic Valve Position: Aortic Lab Used: [facility] [place] Site: [address] INR POC 2.8 INR RANGE 2.0-3.0  Dietary changes: no    Health status changes: no    Bleeding/hemorrhagic complications: no    Recent/future hospitalizations: no    Any changes in medication regimen? no    Recent/future dental: no  Any missed doses?: no       Is patient compliant with meds? yes       Allergies: 1)  ! * Diclofenac  Anticoagulation Management History:      The patient is taking warfarin and comes in today for a routine follow up visit.  Anticoagulation is being administered due to a tissue prosthetic heart valve.  Positive risk factors for bleeding include an age of 66 years or older.  Negative risk factors for bleeding include no history of CVA/TIA, no history of GI bleeding, and absence of serious comorbidities.  The bleeding index is 'intermediate risk'.  Negative CHADS2 values include History of CHF, History of HTN, Age > 66 years old, History of Diabetes, and Prior Stroke/CVA/TIA.  The start date was 05/06/2010.  Anticoagulation responsible provider: Clifton James MD, Cristal Deer.  INR POC: 2.8.  Cuvette Lot#: [id number].  Exp: 10/2011.    Anticoagulation Management Assessment/Plan:      The patient's current anticoagulation dose is Warfarin sodium 5 mg tabs: Use as directed by Anticoagulation Clinic.  The target INR is 2.0-3.0.  The next INR is due 01/07/2011.  Anticoagulation instructions were given to patient.  Results were reviewed/authorized by Vashti Hey, RN.  He was notified by Vashti Hey RN.         Prior Anticoagulation Instructions: INR 3.4 Decrease coumadin to 10mg  once daily except 5mg  on Thursdays  Current Anticoagulation Instructions: INR 2.8 Starting colchicine three  times a day x 3 days and vicodin for pain Take coumadin 1 tablet x 3 days then resume 2 tablets once daily except 1 tablet Thursdays

## 2011-01-17 NOTE — Medication Information (Signed)
Summary: ccr-lr  Anticoagulant Therapy  Managed by: Vashti Hey, RN PCP: [name] Supervising MD: Clifton James MD, Cristal Deer Indication 1: Aortic Valve Replacement Valve Type: Bioprosthetic Valve Position: Aortic Lab Used: [facility] [place] Site: [address] INR POC 2.1 INR RANGE 2.0-3.0  Dietary changes: no    Health status changes: yes       Details: gout  Bleeding/hemorrhagic complications: no    Recent/future hospitalizations: no    Any changes in medication regimen? yes       Details: Been taking prednisone taper for gout  Recent/future dental: no  Any missed doses?: no       Is patient compliant with meds? yes       Allergies: 1)  ! * Diclofenac  Anticoagulation Management History:      The patient is taking warfarin and comes in today for a routine follow up visit.  Anticoagulation is being administered due to a tissue prosthetic heart valve.  Positive risk factors for bleeding include an age of 66 years or older.  Negative risk factors for bleeding include no history of CVA/TIA, no history of GI bleeding, and absence of serious comorbidities.  The bleeding index is 'intermediate risk'.  Negative CHADS2 values include History of CHF, History of HTN, Age > 17 years old, History of Diabetes, and Prior Stroke/CVA/TIA.  The start date was 05/06/2010.  Anticoagulation responsible provider: Clifton James MD, Cristal Deer.  INR POC: 2.1.  Cuvette Lot#: [id number].  Exp: 10/2011.    Anticoagulation Management Assessment/Plan:      The patient's current anticoagulation dose is Warfarin sodium 5 mg tabs: Use as directed by Anticoagulation Clinic.  The target INR is 2.0-3.0.  The next INR is due 01/21/2011.  Anticoagulation instructions were given to patient.  Results were reviewed/authorized by Vashti Hey, RN.  He was notified by Vashti Hey RN.         Prior Anticoagulation Instructions: INR 2.8 Starting colchicine three times a day x 3 days and vicodin for  pain Take coumadin 1 tablet x 3 days then resume 2 tablets once daily except 1 tablet Thursdays  Current Anticoagulation Instructions: INR 2.1 Continue coumadin 10mg  once daily except 5mg  on Thursdays

## 2011-01-21 ENCOUNTER — Encounter (INDEPENDENT_AMBULATORY_CARE_PROVIDER_SITE_OTHER): Payer: No Typology Code available for payment source

## 2011-01-21 ENCOUNTER — Encounter: Payer: Self-pay | Admitting: Cardiology

## 2011-01-21 DIAGNOSIS — I4891 Unspecified atrial fibrillation: Secondary | ICD-10-CM

## 2011-01-21 DIAGNOSIS — Z7901 Long term (current) use of anticoagulants: Secondary | ICD-10-CM

## 2011-01-22 LAB — CBC
HCT: 40.5 % (ref 39.0–52.0)
MCHC: 33.8 g/dL (ref 30.0–36.0)
MCV: 94.8 fL (ref 78.0–100.0)
Platelets: 280 10*3/uL (ref 150–400)
RDW: 14.1 % (ref 11.5–15.5)

## 2011-01-22 LAB — BASIC METABOLIC PANEL
BUN: 12 mg/dL (ref 6–23)
Chloride: 102 mEq/L (ref 96–112)
Creatinine, Ser: 1.27 mg/dL (ref 0.4–1.5)
Glucose, Bld: 93 mg/dL (ref 70–99)
Potassium: 4.9 mEq/L (ref 3.5–5.1)

## 2011-01-24 ENCOUNTER — Encounter (INDEPENDENT_AMBULATORY_CARE_PROVIDER_SITE_OTHER): Payer: No Typology Code available for payment source | Admitting: Cardiothoracic Surgery

## 2011-01-24 ENCOUNTER — Ambulatory Visit
Admission: RE | Admit: 2011-01-24 | Discharge: 2011-01-24 | Disposition: A | Discharge status: Home / Self Care | Payer: Medicare Other | Source: Ambulatory Visit | Attending: Cardiothoracic Surgery | Admitting: Cardiothoracic Surgery

## 2011-01-24 ENCOUNTER — Other Ambulatory Visit: Payer: Self-pay | Admitting: Cardiothoracic Surgery

## 2011-01-24 ENCOUNTER — Encounter: Payer: Self-pay | Admitting: Cardiothoracic Surgery

## 2011-01-24 DIAGNOSIS — I359 Nonrheumatic aortic valve disorder, unspecified: Secondary | ICD-10-CM

## 2011-01-24 NOTE — Assessment & Plan Note (Signed)
OFFICE VISIT  ZAMARIAN, SCARANO DOB:  1946-01-26                                        January 24, 2011 CHART #:  [id number]  [name] returns to the office today in followup after his emergency aortic valve replacement with pericardial tissue valve, removal of left atrial clot, radiofrequency Maze in right and left, suture closure of the left atrial appendage.  The patient originally presented with acute myocardial infarction and had been ventricular fibrillation related to an embolic clot down his coronary from a large left atrial clot.  He has done well following surgery, returned to normal activities, exercising and playing golf on a regular basis.  He has had no angina or evidence of congestive heart failure.  Postoperatively, he has returned to atrial fibrillation, has been maintained on low-dose aspirin and Coumadin.  The patient notes that Dr. Juanda Chance, had tried cardioversion on two attempts.  On exam today, his blood pressure is 140/92, pulse 96 and irregular, respiratory rate 20, and O2 sats 98% on room air.  His sternum is stable and well healed.  Bowel sounds are crisp with no murmur of aortic insufficiency.  His lower extremities are without edema or tenderness. 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  He is interested in bilateral knee replacements and has talked to Dr. Thurston Hole, about this in the past and is continuing to be followed.  Overall I pleased with his progress.  Unfortunately, he had to be maintained on Coumadin because of atrial fibrillation.  He could be considered for AFib and flutter ablation in the future.  I do plan to see him back in 6 months.  Sheliah Plane, MD Electronically Signed  EG/MEDQ  D:  01/24/2011  T:  01/24/2011  Job:  [id number]  cc:   Marca Ancona, MD

## 2011-01-27 LAB — CBC
HCT: 25.5 % — ABNORMAL LOW (ref 39.0–52.0)
HCT: 26.1 % — ABNORMAL LOW (ref 39.0–52.0)
HCT: 26.4 % — ABNORMAL LOW (ref 39.0–52.0)
HCT: 28 % — ABNORMAL LOW (ref 39.0–52.0)
HCT: 28 % — ABNORMAL LOW (ref 39.0–52.0)
HCT: 29.1 % — ABNORMAL LOW (ref 39.0–52.0)
HCT: 30.1 % — ABNORMAL LOW (ref 39.0–52.0)
HCT: 30.9 % — ABNORMAL LOW (ref 39.0–52.0)
HCT: 32.9 % — ABNORMAL LOW (ref 39.0–52.0)
HCT: 39 % (ref 39.0–52.0)
HCT: 39.4 % (ref 39.0–52.0)
HCT: 42.4 % (ref 39.0–52.0)
HCT: 43.4 % (ref 39.0–52.0)
Hemoglobin: 10 g/dL — ABNORMAL LOW (ref 13.0–17.0)
Hemoglobin: 10.2 g/dL — ABNORMAL LOW (ref 13.0–17.0)
Hemoglobin: 10.7 g/dL — ABNORMAL LOW (ref 13.0–17.0)
Hemoglobin: 11.1 g/dL — ABNORMAL LOW (ref 13.0–17.0)
Hemoglobin: 13.6 g/dL (ref 13.0–17.0)
Hemoglobin: 13.7 g/dL (ref 13.0–17.0)
Hemoglobin: 14 g/dL (ref 13.0–17.0)
Hemoglobin: 14.2 g/dL (ref 13.0–17.0)
Hemoglobin: 8.8 g/dL — ABNORMAL LOW (ref 13.0–17.0)
Hemoglobin: 8.9 g/dL — ABNORMAL LOW (ref 13.0–17.0)
Hemoglobin: 9.1 g/dL — ABNORMAL LOW (ref 13.0–17.0)
Hemoglobin: 9.7 g/dL — ABNORMAL LOW (ref 13.0–17.0)
Hemoglobin: 9.8 g/dL — ABNORMAL LOW (ref 13.0–17.0)
MCH: 36.3 pg — ABNORMAL HIGH (ref 26.0–34.0)
MCH: 36.7 pg — ABNORMAL HIGH (ref 26.0–34.0)
MCH: 36.7 pg — ABNORMAL HIGH (ref 26.0–34.0)
MCH: 36.8 pg — ABNORMAL HIGH (ref 26.0–34.0)
MCH: 36.8 pg — ABNORMAL HIGH (ref 26.0–34.0)
MCHC: 33.8 g/dL (ref 30.0–36.0)
MCHC: 34 g/dL (ref 30.0–36.0)
MCHC: 34.2 g/dL (ref 30.0–36.0)
MCHC: 34.4 g/dL (ref 30.0–36.0)
MCHC: 34.4 g/dL (ref 30.0–36.0)
MCHC: 34.4 g/dL (ref 30.0–36.0)
MCHC: 34.5 g/dL (ref 30.0–36.0)
MCHC: 34.5 g/dL (ref 30.0–36.0)
MCHC: 34.5 g/dL (ref 30.0–36.0)
MCHC: 34.6 g/dL (ref 30.0–36.0)
MCHC: 34.8 g/dL (ref 30.0–36.0)
MCHC: 34.8 g/dL (ref 30.0–36.0)
MCHC: 35.1 g/dL (ref 30.0–36.0)
MCHC: 35.1 g/dL (ref 30.0–36.0)
MCV: 105.8 fL — ABNORMAL HIGH (ref 78.0–100.0)
MCV: 106.1 fL — ABNORMAL HIGH (ref 78.0–100.0)
MCV: 106.2 fL — ABNORMAL HIGH (ref 78.0–100.0)
MCV: 106.3 fL — ABNORMAL HIGH (ref 78.0–100.0)
MCV: 106.3 fL — ABNORMAL HIGH (ref 78.0–100.0)
MCV: 106.3 fL — ABNORMAL HIGH (ref 78.0–100.0)
MCV: 106.6 fL — ABNORMAL HIGH (ref 78.0–100.0)
MCV: 106.6 fL — ABNORMAL HIGH (ref 78.0–100.0)
MCV: 106.7 fL — ABNORMAL HIGH (ref 78.0–100.0)
MCV: 106.8 fL — ABNORMAL HIGH (ref 78.0–100.0)
MCV: 107 fL — ABNORMAL HIGH (ref 78.0–100.0)
MCV: 107.1 fL — ABNORMAL HIGH (ref 78.0–100.0)
Platelets: 101 10*3/uL — ABNORMAL LOW (ref 150–400)
Platelets: 103 10*3/uL — ABNORMAL LOW (ref 150–400)
Platelets: 110 10*3/uL — ABNORMAL LOW (ref 150–400)
Platelets: 111 10*3/uL — ABNORMAL LOW (ref 150–400)
Platelets: 129 10*3/uL — ABNORMAL LOW (ref 150–400)
Platelets: 135 10*3/uL — ABNORMAL LOW (ref 150–400)
Platelets: 148 10*3/uL — ABNORMAL LOW (ref 150–400)
Platelets: 158 10*3/uL (ref 150–400)
Platelets: 204 10*3/uL (ref 150–400)
Platelets: 234 10*3/uL (ref 150–400)
Platelets: 253 10*3/uL (ref 150–400)
RBC: 2.39 MIL/uL — ABNORMAL LOW (ref 4.22–5.81)
RBC: 2.46 MIL/uL — ABNORMAL LOW (ref 4.22–5.81)
RBC: 2.48 MIL/uL — ABNORMAL LOW (ref 4.22–5.81)
RBC: 2.62 MIL/uL — ABNORMAL LOW (ref 4.22–5.81)
RBC: 2.63 MIL/uL — ABNORMAL LOW (ref 4.22–5.81)
RBC: 2.73 MIL/uL — ABNORMAL LOW (ref 4.22–5.81)
RBC: 2.81 MIL/uL — ABNORMAL LOW (ref 4.22–5.81)
RBC: 2.92 MIL/uL — ABNORMAL LOW (ref 4.22–5.81)
RBC: 3.07 MIL/uL — ABNORMAL LOW (ref 4.22–5.81)
RBC: 3.5 MIL/uL — ABNORMAL LOW (ref 4.22–5.81)
RBC: 3.67 MIL/uL — ABNORMAL LOW (ref 4.22–5.81)
RBC: 3.86 MIL/uL — ABNORMAL LOW (ref 4.22–5.81)
RBC: 3.88 MIL/uL — ABNORMAL LOW (ref 4.22–5.81)
RDW: 12.5 % (ref 11.5–15.5)
RDW: 12.7 % (ref 11.5–15.5)
RDW: 12.7 % (ref 11.5–15.5)
RDW: 12.8 % (ref 11.5–15.5)
RDW: 12.8 % (ref 11.5–15.5)
RDW: 12.8 % (ref 11.5–15.5)
RDW: 12.8 % (ref 11.5–15.5)
RDW: 12.8 % (ref 11.5–15.5)
RDW: 12.9 % (ref 11.5–15.5)
RDW: 13.1 % (ref 11.5–15.5)
RDW: 13.1 % (ref 11.5–15.5)
RDW: 13.1 % (ref 11.5–15.5)
RDW: 13.1 % (ref 11.5–15.5)
RDW: 13.2 % (ref 11.5–15.5)
RDW: 13.3 % (ref 11.5–15.5)
WBC: 11.4 10*3/uL — ABNORMAL HIGH (ref 4.0–10.5)
WBC: 12.4 10*3/uL — ABNORMAL HIGH (ref 4.0–10.5)
WBC: 12.7 10*3/uL — ABNORMAL HIGH (ref 4.0–10.5)
WBC: 14.3 10*3/uL — ABNORMAL HIGH (ref 4.0–10.5)
WBC: 15.5 10*3/uL — ABNORMAL HIGH (ref 4.0–10.5)
WBC: 5.9 10*3/uL (ref 4.0–10.5)
WBC: 6.9 10*3/uL (ref 4.0–10.5)
WBC: 7.3 10*3/uL (ref 4.0–10.5)
WBC: 8.3 10*3/uL (ref 4.0–10.5)

## 2011-01-27 LAB — PROTIME-INR
INR: 1.05 (ref 0.00–1.49)
INR: 1.16 (ref 0.00–1.49)
INR: 1.17 (ref 0.00–1.49)
INR: 1.19 (ref 0.00–1.49)
INR: 1.33 (ref 0.00–1.49)
INR: 1.51 — ABNORMAL HIGH (ref 0.00–1.49)
INR: 1.83 — ABNORMAL HIGH (ref 0.00–1.49)
Prothrombin Time: 13.6 seconds (ref 11.6–15.2)
Prothrombin Time: 14.7 seconds (ref 11.6–15.2)
Prothrombin Time: 14.8 seconds (ref 11.6–15.2)
Prothrombin Time: 15 seconds (ref 11.6–15.2)
Prothrombin Time: 16.4 seconds — ABNORMAL HIGH (ref 11.6–15.2)
Prothrombin Time: 18.1 seconds — ABNORMAL HIGH (ref 11.6–15.2)
Prothrombin Time: 21 seconds — ABNORMAL HIGH (ref 11.6–15.2)

## 2011-01-27 LAB — PLATELET INHIBITION P2Y12
P2Y12 % Inhibition: 30 %
P2Y12 % Inhibition: 70 %
Platelet Function Baseline: 225 [PRU] (ref 194–418)

## 2011-01-27 LAB — BASIC METABOLIC PANEL
BUN: 10 mg/dL (ref 6–23)
BUN: 10 mg/dL (ref 6–23)
BUN: 13 mg/dL (ref 6–23)
BUN: 13 mg/dL (ref 6–23)
BUN: 7 mg/dL (ref 6–23)
BUN: 9 mg/dL (ref 6–23)
BUN: 9 mg/dL (ref 6–23)
CO2: 25 mEq/L (ref 19–32)
CO2: 26 mEq/L (ref 19–32)
CO2: 28 mEq/L (ref 19–32)
CO2: 29 mEq/L (ref 19–32)
CO2: 29 mEq/L (ref 19–32)
CO2: 29 mEq/L (ref 19–32)
Calcium: 7.7 mg/dL — ABNORMAL LOW (ref 8.4–10.5)
Calcium: 8.3 mg/dL — ABNORMAL LOW (ref 8.4–10.5)
Calcium: 8.4 mg/dL (ref 8.4–10.5)
Calcium: 8.6 mg/dL (ref 8.4–10.5)
Calcium: 8.6 mg/dL (ref 8.4–10.5)
Calcium: 8.8 mg/dL (ref 8.4–10.5)
Calcium: 9 mg/dL (ref 8.4–10.5)
Calcium: 9.2 mg/dL (ref 8.4–10.5)
Chloride: 101 mEq/L (ref 96–112)
Chloride: 101 mEq/L (ref 96–112)
Chloride: 101 mEq/L (ref 96–112)
Chloride: 97 mEq/L (ref 96–112)
Chloride: 99 mEq/L (ref 96–112)
Creatinine, Ser: 0.99 mg/dL (ref 0.4–1.5)
Creatinine, Ser: 1.02 mg/dL (ref 0.4–1.5)
Creatinine, Ser: 1.03 mg/dL (ref 0.4–1.5)
Creatinine, Ser: 1.03 mg/dL (ref 0.4–1.5)
Creatinine, Ser: 1.07 mg/dL (ref 0.4–1.5)
Creatinine, Ser: 1.11 mg/dL (ref 0.4–1.5)
GFR calc Af Amer: >60 mL/min (ref >60)
GFR calc Af Amer: >60 mL/min (ref >60)
GFR calc Af Amer: >60 mL/min (ref >60)
GFR calc Af Amer: >60 mL/min (ref >60)
GFR calc Af Amer: >60 mL/min (ref >60)
GFR calc Af Amer: >60 mL/min (ref >60)
GFR calc Af Amer: >60 mL/min (ref >60)
GFR calc non Af Amer: >60 mL/min (ref >60)
GFR calc non Af Amer: >60 mL/min (ref >60)
GFR calc non Af Amer: >60 mL/min (ref >60)
GFR calc non Af Amer: >60 mL/min (ref >60)
GFR calc non Af Amer: >60 mL/min (ref >60)
GFR calc non Af Amer: >60 mL/min (ref >60)
GFR calc non Af Amer: >60 mL/min (ref >60)
GFR calc non Af Amer: >60 mL/min (ref >60)
Glucose, Bld: 105 mg/dL — ABNORMAL HIGH (ref 70–99)
Glucose, Bld: 125 mg/dL — ABNORMAL HIGH (ref 70–99)
Glucose, Bld: 137 mg/dL — ABNORMAL HIGH (ref 70–99)
Glucose, Bld: 89 mg/dL (ref 70–99)
Glucose, Bld: 90 mg/dL (ref 70–99)
Glucose, Bld: 92 mg/dL (ref 70–99)
Glucose, Bld: 93 mg/dL (ref 70–99)
Glucose, Bld: 96 mg/dL (ref 70–99)
Potassium: 3.8 mEq/L (ref 3.5–5.1)
Potassium: 3.9 mEq/L (ref 3.5–5.1)
Potassium: 4 mEq/L (ref 3.5–5.1)
Potassium: 4.2 mEq/L (ref 3.5–5.1)
Potassium: 4.4 mEq/L (ref 3.5–5.1)
Potassium: 4.4 mEq/L (ref 3.5–5.1)
Sodium: 130 mEq/L — ABNORMAL LOW (ref 135–145)
Sodium: 133 mEq/L — ABNORMAL LOW (ref 135–145)
Sodium: 134 mEq/L — ABNORMAL LOW (ref 135–145)
Sodium: 136 mEq/L (ref 135–145)
Sodium: 136 mEq/L (ref 135–145)
Sodium: 138 mEq/L (ref 135–145)

## 2011-01-27 LAB — POCT I-STAT 3, ART BLOOD GAS (G3+)
Acid-Base Excess: 1 mmol/L (ref 0.0–2.0)
Acid-base deficit: 5 mmol/L — ABNORMAL HIGH (ref 0.0–2.0)
Bicarbonate: 20.8 mEq/L (ref 20.0–24.0)
Bicarbonate: 24.9 mEq/L — ABNORMAL HIGH (ref 20.0–24.0)
Bicarbonate: 26 mEq/L — ABNORMAL HIGH (ref 20.0–24.0)
O2 Saturation: 95 %
O2 Saturation: 97 %
Patient temperature: 37
TCO2: 26 mmol/L (ref 0–100)
TCO2: 27 mmol/L (ref 0–100)
pCO2 arterial: 31.5 mmHg — ABNORMAL LOW (ref 35.0–45.0)
pCO2 arterial: 41.5 mmHg (ref 35.0–45.0)
pH, Arterial: 7.404 (ref 7.350–7.450)
pH, Arterial: 7.439 (ref 7.350–7.450)
pO2, Arterial: 296 mmHg — ABNORMAL HIGH (ref 80.0–100.0)
pO2, Arterial: 77 mmHg — ABNORMAL LOW (ref 80.0–100.0)
pO2, Arterial: 86 mmHg (ref 80.0–100.0)

## 2011-01-27 LAB — URINALYSIS, ROUTINE W REFLEX MICROSCOPIC
Ketones, ur: NEGATIVE mg/dL
Nitrite: NEGATIVE
Protein, ur: NEGATIVE mg/dL
Urobilinogen, UA: 1 mg/dL (ref 0.0–1.0)
pH: 6 (ref 5.0–8.0)

## 2011-01-27 LAB — POCT I-STAT 4, (NA,K, GLUC, HGB,HCT)
Glucose, Bld: 102 mg/dL — ABNORMAL HIGH (ref 70–99)
Glucose, Bld: 86 mg/dL (ref 70–99)
Glucose, Bld: 92 mg/dL (ref 70–99)
HCT: 28 % — ABNORMAL LOW (ref 39.0–52.0)
HCT: 30 % — ABNORMAL LOW (ref 39.0–52.0)
HCT: 31 % — ABNORMAL LOW (ref 39.0–52.0)
HCT: 35 % — ABNORMAL LOW (ref 39.0–52.0)
HCT: 38 % — ABNORMAL LOW (ref 39.0–52.0)
Hemoglobin: 10.5 g/dL — ABNORMAL LOW (ref 13.0–17.0)
Hemoglobin: 12.9 g/dL — ABNORMAL LOW (ref 13.0–17.0)
Hemoglobin: 8.5 g/dL — ABNORMAL LOW (ref 13.0–17.0)
Hemoglobin: 9.5 g/dL — ABNORMAL LOW (ref 13.0–17.0)
Potassium: 3.5 mEq/L (ref 3.5–5.1)
Potassium: 3.9 mEq/L (ref 3.5–5.1)
Potassium: 4.5 mEq/L (ref 3.5–5.1)
Potassium: 4.7 mEq/L (ref 3.5–5.1)
Sodium: 134 mEq/L — ABNORMAL LOW (ref 135–145)
Sodium: 135 mEq/L (ref 135–145)
Sodium: 136 mEq/L (ref 135–145)

## 2011-01-27 LAB — POCT I-STAT, CHEM 8
BUN: 11 mg/dL (ref 6–23)
BUN: 9 mg/dL (ref 6–23)
Creatinine, Ser: 1 mg/dL (ref 0.4–1.5)
Creatinine, Ser: 1.1 mg/dL (ref 0.4–1.5)
Glucose, Bld: 129 mg/dL — ABNORMAL HIGH (ref 70–99)
Potassium: 4.2 mEq/L (ref 3.5–5.1)
Potassium: 4.7 mEq/L (ref 3.5–5.1)
Sodium: 134 mEq/L — ABNORMAL LOW (ref 135–145)
Sodium: 140 mEq/L (ref 135–145)

## 2011-01-27 LAB — CARDIAC PANEL(CRET KIN+CKTOT+MB+TROPI)
Relative Index: 9.5 — ABNORMAL HIGH (ref 0.0–2.5)
Troponin I: 95.86 ng/mL (ref 0.00–0.06)

## 2011-01-27 LAB — GLUCOSE, CAPILLARY
Glucose-Capillary: 104 mg/dL — ABNORMAL HIGH (ref 70–99)
Glucose-Capillary: 109 mg/dL — ABNORMAL HIGH (ref 70–99)
Glucose-Capillary: 111 mg/dL — ABNORMAL HIGH (ref 70–99)
Glucose-Capillary: 117 mg/dL — ABNORMAL HIGH (ref 70–99)
Glucose-Capillary: 118 mg/dL — ABNORMAL HIGH (ref 70–99)
Glucose-Capillary: 119 mg/dL — ABNORMAL HIGH (ref 70–99)
Glucose-Capillary: 122 mg/dL — ABNORMAL HIGH (ref 70–99)
Glucose-Capillary: 131 mg/dL — ABNORMAL HIGH (ref 70–99)
Glucose-Capillary: 136 mg/dL — ABNORMAL HIGH (ref 70–99)
Glucose-Capillary: 141 mg/dL — ABNORMAL HIGH (ref 70–99)
Glucose-Capillary: 144 mg/dL — ABNORMAL HIGH (ref 70–99)
Glucose-Capillary: 145 mg/dL — ABNORMAL HIGH (ref 70–99)
Glucose-Capillary: 154 mg/dL — ABNORMAL HIGH (ref 70–99)
Glucose-Capillary: 99 mg/dL (ref 70–99)

## 2011-01-27 LAB — TYPE AND SCREEN

## 2011-01-27 LAB — HEMOGLOBIN A1C
Hgb A1c MFr Bld: 5.3 % (ref <5.7)
Mean Plasma Glucose: 105 mg/dL (ref <117)

## 2011-01-27 LAB — ABO/RH: ABO/RH(D): B POS

## 2011-01-27 LAB — PREPARE RBC (CROSSMATCH)

## 2011-01-27 LAB — LIPID PANEL
Cholesterol: 104 mg/dL (ref 0–200)
HDL: 41 mg/dL (ref >39)
LDL Cholesterol: 47 mg/dL (ref 0–99)
Triglycerides: 79 mg/dL (ref <150)

## 2011-01-27 LAB — CREATININE, SERUM
Creatinine, Ser: 1.05 mg/dL (ref 0.4–1.5)
Creatinine, Ser: 1.07 mg/dL (ref 0.4–1.5)
GFR calc Af Amer: >60 mL/min (ref >60)
GFR calc Af Amer: >60 mL/min (ref >60)
GFR calc non Af Amer: >60 mL/min (ref >60)
GFR calc non Af Amer: >60 mL/min (ref >60)

## 2011-01-27 LAB — BLOOD GAS, ARTERIAL
Bicarbonate: 22.9 mEq/L (ref 20.0–24.0)
FIO2: 0.21 %
pCO2 arterial: 30.6 mmHg — ABNORMAL LOW (ref 35.0–45.0)
pH, Arterial: 7.487 — ABNORMAL HIGH (ref 7.350–7.450)
pO2, Arterial: 86.2 mmHg (ref 80.0–100.0)

## 2011-01-27 LAB — MAGNESIUM
Magnesium: 1.8 mg/dL (ref 1.5–2.5)
Magnesium: 2 mg/dL (ref 1.5–2.5)
Magnesium: 2.3 mg/dL (ref 1.5–2.5)
Magnesium: 3 mg/dL — ABNORMAL HIGH (ref 1.5–2.5)

## 2011-01-27 LAB — COMPREHENSIVE METABOLIC PANEL
ALT: 49 U/L (ref 0–53)
Albumin: 3.4 g/dL — ABNORMAL LOW (ref 3.5–5.2)
BUN: 14 mg/dL (ref 6–23)
BUN: 15 mg/dL (ref 6–23)
CO2: 28 mEq/L (ref 19–32)
Calcium: 8.5 mg/dL (ref 8.4–10.5)
Calcium: 9.4 mg/dL (ref 8.4–10.5)
Creatinine, Ser: 1.08 mg/dL (ref 0.4–1.5)
Creatinine, Ser: 1.39 mg/dL (ref 0.4–1.5)
GFR calc non Af Amer: >60 mL/min (ref >60)
Glucose, Bld: 128 mg/dL — ABNORMAL HIGH (ref 70–99)
Potassium: 4.2 mEq/L (ref 3.5–5.1)
Total Protein: 6.8 g/dL (ref 6.0–8.3)

## 2011-01-27 LAB — HEMOGLOBIN AND HEMATOCRIT, BLOOD
HCT: 27.3 % — ABNORMAL LOW (ref 39.0–52.0)
Hemoglobin: 9.5 g/dL — ABNORMAL LOW (ref 13.0–17.0)

## 2011-01-27 LAB — HEPARIN LEVEL (UNFRACTIONATED)
Heparin Unfractionated: 0.1 IU/mL — ABNORMAL LOW (ref 0.30–0.70)
Heparin Unfractionated: 0.55 IU/mL (ref 0.30–0.70)

## 2011-01-27 LAB — APTT: aPTT: 45 seconds — ABNORMAL HIGH (ref 24–37)

## 2011-01-28 ENCOUNTER — Encounter (INDEPENDENT_AMBULATORY_CARE_PROVIDER_SITE_OTHER): Payer: Medicare Other

## 2011-01-28 ENCOUNTER — Encounter: Payer: Self-pay | Admitting: Cardiology

## 2011-01-28 DIAGNOSIS — I4891 Unspecified atrial fibrillation: Secondary | ICD-10-CM

## 2011-01-28 DIAGNOSIS — Z7901 Long term (current) use of anticoagulants: Secondary | ICD-10-CM

## 2011-01-28 LAB — COMPREHENSIVE METABOLIC PANEL
BUN: 19 mg/dL (ref 6–23)
CO2: 17 mEq/L — ABNORMAL LOW (ref 19–32)
Calcium: 9.5 mg/dL (ref 8.4–10.5)
Chloride: 102 mEq/L (ref 96–112)
Creatinine, Ser: 1.36 mg/dL (ref 0.4–1.5)
GFR calc non Af Amer: 53 mL/min — ABNORMAL LOW (ref >60)
Glucose, Bld: 137 mg/dL — ABNORMAL HIGH (ref 70–99)
Total Bilirubin: 0.8 mg/dL (ref 0.3–1.2)

## 2011-01-28 LAB — CONVERTED CEMR LAB: POC INR: 2.4

## 2011-01-28 LAB — POCT I-STAT 3, VENOUS BLOOD GAS (G3P V)
Acid-base deficit: 5 mmol/L — ABNORMAL HIGH (ref 0.0–2.0)
Bicarbonate: 21 mEq/L (ref 20.0–24.0)
TCO2: 22 mmol/L (ref 0–100)

## 2011-01-28 LAB — BASIC METABOLIC PANEL
CO2: 30 mEq/L (ref 19–32)
Chloride: 100 mEq/L (ref 96–112)
GFR calc Af Amer: >60 mL/min (ref >60)
Sodium: 138 mEq/L (ref 135–145)

## 2011-01-28 LAB — DIFFERENTIAL
Basophils Absolute: 0 10*3/uL (ref 0.0–0.1)
Eosinophils Relative: 1 % (ref 0–5)
Lymphocytes Relative: 17 % (ref 12–46)
Lymphs Abs: 1.9 10*3/uL (ref 0.7–4.0)
Neutro Abs: 8.5 10*3/uL — ABNORMAL HIGH (ref 1.7–7.7)
Neutrophils Relative %: 75 % (ref 43–77)

## 2011-01-28 LAB — POCT I-STAT 3, ART BLOOD GAS (G3+)
Bicarbonate: 21.3 mEq/L (ref 20.0–24.0)
O2 Saturation: 97 %
TCO2: 22 mmol/L (ref 0–100)
pH, Arterial: 7.362 (ref 7.350–7.450)

## 2011-01-28 LAB — CBC
HCT: 45.7 % (ref 39.0–52.0)
MCHC: 34.2 g/dL (ref 30.0–36.0)
MCV: 105.8 fL — ABNORMAL HIGH (ref 78.0–100.0)
RBC: 4.32 MIL/uL (ref 4.22–5.81)
WBC: 11.4 10*3/uL — ABNORMAL HIGH (ref 4.0–10.5)

## 2011-01-28 LAB — CARDIAC PANEL(CRET KIN+CKTOT+MB+TROPI): Troponin I: 34.59 ng/mL (ref 0.00–0.06)

## 2011-01-28 LAB — PROTIME-INR
INR: 0.99 (ref 0.00–1.49)
Prothrombin Time: 13 seconds (ref 11.6–15.2)

## 2011-01-28 LAB — POCT CARDIAC MARKERS

## 2011-01-28 LAB — APTT: aPTT: 22 seconds — ABNORMAL LOW (ref 24–37)

## 2011-01-29 NOTE — Medication Information (Signed)
Summary: ccr-lr  Anticoagulant Therapy  Managed by: Vashti Hey, RN PCP: [name] Supervising MD: Dietrich Pates MD, Molly Maduro Indication 1: Aortic Valve Replacement Valve Type: Bioprosthetic Valve Position: Aortic Lab Used: [facility] [place] Site: [address] INR POC 5.2 INR RANGE 2.0-3.0  Dietary changes: no    Health status changes: no    Bleeding/hemorrhagic complications: no    Recent/future hospitalizations: no    Any changes in medication regimen? no    Recent/future dental: no  Any missed doses?: no       Is patient compliant with meds? yes       Allergies: 1)  ! * Diclofenac  Anticoagulation Management History:      The patient is taking warfarin and comes in today for a routine follow up visit.  Anticoagulation is being administered due to a tissue prosthetic heart valve.  Positive risk factors for bleeding include an age of 66 years or older.  Negative risk factors for bleeding include no history of CVA/TIA, no history of GI bleeding, and absence of serious comorbidities.  The bleeding index is 'intermediate risk'.  Negative CHADS2 values include History of CHF, History of HTN, Age > 48 years old, History of Diabetes, and Prior Stroke/CVA/TIA.  The start date was 05/06/2010.  Anticoagulation responsible provider: Dietrich Pates MD, Molly Maduro.  INR POC: 5.2.  Cuvette Lot#: [id number].  Exp: 10/2011.    Anticoagulation Management Assessment/Plan:      The patient's current anticoagulation dose is Warfarin sodium 5 mg tabs: Use as directed by Anticoagulation Clinic.  The target INR is 2.0-3.0.  The next INR is due 01/28/2011.  Anticoagulation instructions were given to patient.  Results were reviewed/authorized by Vashti Hey, RN.  He was notified by Vashti Hey RN.         Prior Anticoagulation Instructions: INR 2.1 Continue coumadin 10mg  once daily except 5mg  on Thursdays  Current Anticoagulation Instructions: INR 5.2 Has been on colchicine and antibiotic  for gout Hold coumadin x 3 night then resume 10mg  once daily except 5mg  on Thursdays

## 2011-02-06 ENCOUNTER — Encounter: Payer: Self-pay | Admitting: Cardiology

## 2011-02-07 ENCOUNTER — Encounter: Payer: Self-pay | Admitting: Cardiology

## 2011-02-07 ENCOUNTER — Ambulatory Visit (INDEPENDENT_AMBULATORY_CARE_PROVIDER_SITE_OTHER): Payer: Medicare Other | Admitting: Cardiology

## 2011-02-07 VITALS — BP 123/81 | HR 79 | Ht 71.0 in | Wt 186.5 lb

## 2011-02-07 DIAGNOSIS — I359 Nonrheumatic aortic valve disorder, unspecified: Secondary | ICD-10-CM

## 2011-02-07 DIAGNOSIS — I2510 Atherosclerotic heart disease of native coronary artery without angina pectoris: Secondary | ICD-10-CM

## 2011-02-07 DIAGNOSIS — N529 Male erectile dysfunction, unspecified: Secondary | ICD-10-CM

## 2011-02-07 DIAGNOSIS — I4891 Unspecified atrial fibrillation: Secondary | ICD-10-CM

## 2011-02-07 DIAGNOSIS — Z952 Presence of prosthetic heart valve: Secondary | ICD-10-CM

## 2011-02-07 DIAGNOSIS — M1990 Unspecified osteoarthritis, unspecified site: Secondary | ICD-10-CM | POA: Insufficient documentation

## 2011-02-07 DIAGNOSIS — Z954 Presence of other heart-valve replacement: Secondary | ICD-10-CM

## 2011-02-07 MED ORDER — SILDENAFIL CITRATE 25 MG PO TABS: ORAL_TABLET | ORAL | Status: DC

## 2011-02-07 NOTE — Assessment & Plan Note (Signed)
Patient actually did not have angiographic CAD and it was thought that his MI was embolic from left atrial thrombus.  He is doing quite well now, NYHA class I symptoms.  Will repeat echo in 8/12 for LV systolic function.  Will check lipids/LFTs.

## 2011-02-07 NOTE — Assessment & Plan Note (Signed)
Stable status post bioprosthetic aortic valve replacement.  Plan repeat echo in 8/12, will reassess valve at that time.

## 2011-02-07 NOTE — Medication Information (Signed)
Summary: ccr-lr  Anticoagulant Therapy  Managed by: Vashti Hey, RN PCP: [name] Supervising MD: Dietrich Pates MD, Molly Maduro Indication 1: Aortic Valve Replacement Valve Type: Bioprosthetic Valve Position: Aortic Lab Used: [facility] Site: [address] INR POC 2.4 INR RANGE 2.0-3.0  Dietary changes: no    Health status changes: no    Bleeding/hemorrhagic complications: no    Recent/future hospitalizations: no    Any changes in medication regimen? no    Recent/future dental: no  Any missed doses?: no       Is patient compliant with meds? yes       Allergies: 1)  ! * Diclofenac  Anticoagulation Management History:      The patient is taking warfarin and comes in today for a routine follow up visit.  The patient is taking warfarin for a tissue prosthetic heart valve.  Positive risk factors for bleeding include an age of 66 years or older.  Negative risk factors for bleeding include no history of CVA/TIA, no history of GI bleeding, and absence of serious comorbidities.  The bleeding index is 'intermediate risk'.  Negative CHADS2 values include History of CHF, History of HTN, Age > 7 years old, History of Diabetes, and Prior Stroke/CVA/TIA.  The start date was 05/06/2010.  Anticoagulation responsible provider: Dietrich Pates MD, Molly Maduro.  INR POC: 2.4.  Cuvette Lot#: [id number].  Exp: 10/2011.    Anticoagulation Management Assessment/Plan:      The patient's current anticoagulation dose is Warfarin sodium 5 mg tabs: Use as directed by Anticoagulation Clinic.  The target INR is 2.0-3.0.  The next INR is due 02/11/2011.  Anticoagulation instructions were given to patient.  Results were reviewed/authorized by Vashti Hey, RN.  He was notified by Vashti Hey RN.         Prior Anticoagulation Instructions: INR 5.2 Has been on colchicine and antibiotic for gout Hold coumadin x 3 night then resume 10mg  once daily except 5mg  on Thursdays  Current Anticoagulation  Instructions: INR 2.4 Continue coumadin 10mg  once daily except 5mg  on Thursdays

## 2011-02-07 NOTE — Progress Notes (Signed)
65 yo with history of atrial fibrillation, cardioembolic MI, and aortic stenosis s/p aortic valve replacement presents for cardiology evaluation prior to knee surgery.  He needs right TKR in 5/12 due to osteoarthritis with severe pain with ambulation.  He denies any chest pain or exertional dyspnea.  He is able to climb a flight of steps or walk up a hill without cardiopulmonary problems but he does get severe knee pain.  He does have a history of cardioembolic MI and large left atrial appendage thrombus in the setting of atrial fibrillation/flutter and is taking coumadin.   ECG: atrial flutter at 66, poor anterior R wave progression  Labs (1/12): K 4.7, creatinine 1.1, HDL 71, LDL 72  Allergies (verified):  1)  ! * Diclofenac  Past Medical History: 1. Myocardial infarction: Patient had an inferoposterior MI in 6/11 complicated by ventricular fibrillation arrest.  LHC showed occlusion of OM1 and and left-sided PDA.  Other vessels showed no angiographic CAD.  This was thought to be embolic and was treated with heparin gtt.  He was actually found soon thereafter to have extensive left atrial thrombus. TEE (8/11) showed EF 50-55%, well-seated bioprosthetic aortic valve.  2. Ventricular fibrillation arrest in setting of acute MI.  3. Aortic stenosis: Severe AS.  Patient had AV replacement with bioprosthetic aortic valve in 6/11.  4. Atrial fibrillation/flutter: Complicated by cardioembolic MI.  At time of aortic valve replacement in 6/11, was noted to have extensive left atrial thrombus.  Thrombus was removed and patient had Maze procedure and left atrial appendage ligation.  Atrial fibrillation recurred and patient had 2 cardioversions while on amiodarone, neither succeeded.  He is now off amiodarone and rate controlled with coumadin anticoagulation.  5. Gout 6. History of pneumonia.  7. Diverticulosis.      Family History: No premature CAD  Social History: Lives in [place] with his wife.  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  Rare ETOH.  Tobacco Use - Former. pt no longer smokes cigars quite in April 24, 2010 Smoking Status:  quit  Review of Systems        All systems reviewed and negative except as per HPI.   Current Outpatient Prescriptions  Medication Sig Dispense Refill  . acetaminophen (TYLENOL) 500 MG tablet Take 500 mg by mouth as needed. For knee pain.       Marland Kitchen allopurinol (ZYLOPRIM) 100 MG tablet Take 100 mg by mouth daily.        Marland Kitchen aspirin 81 MG tablet Take 81 mg by mouth daily.        Marland Kitchen HYDROcodone-acetaminophen (VICODIN ES) 7.5-750 MG per tablet Take 1 tablet by mouth Every 4 hours as needed.      . metoprolol (TOPROL-XL) 50 MG 24 hr tablet Take 50 mg by mouth 2 (two) times daily.       Marland Kitchen warfarin (COUMADIN) 5 MG tablet Take by mouth as directed.       . sildenafil (VIAGRA) 25 MG tablet May use two tablets if one tablet is not effective.  10 tablet  1    BP 123/81  Pulse 79  Ht 5\' 11"  (1.803 m)  Wt 186 lb 8 oz (84.596 kg)  BMI 26.01 kg/m2 General:  Well developed, well nourished, in no acute distress. Neck:  Neck supple, no JVD. No masses, thyromegaly or abnormal cervical nodes. Lungs:  Clear bilaterally to auscultation and percussion. Heart:  Non-displaced PMI, chest non-tender; irregular rate and rhythm, S1, S2 without rubs or gallops.  2/6 early systolic murmur RUSB.  Carotid upstroke normal, no bruit. Pedals normal pulses. No edema, no varicosities. Abdomen:  Bowel sounds positive; abdomen soft and non-tender without masses, organomegaly, or hernias noted. No hepatosplenomegaly. Extremities:  No clubbing or cyanosis. Neurologic:  Alert and oriented x 3. Psych:  Normal affect.

## 2011-02-07 NOTE — Assessment & Plan Note (Signed)
Patient needs right TKR.  He has no significant exertional dyspnea or chest pain and is limited only by knee pain.  I do not think he needs further workup prior to surgery.  He should continue his Toprol XL.  He should be bridged with Lovenox when off coumadin, as described below.

## 2011-02-07 NOTE — Patient Instructions (Signed)
Use Viagra 25mg   as needed. You can take two tablets if one tablet is not effective.  You can continue to have your coumadin checked in [place] for now.  You will need to be off Coumadin and bridged with Lovenox prior to knee surgery.   Please schedule an appointment in the coumadin clinic at [facility] to help bridge you to lovenox prior to your knee surgery in May. This appointment should be a couple of weeks prior to knee surgery. Dr Shirlee Latch would like to coumadin clinic at [facility] to manage this for you.  Echocardiogram in August.  Follow-up appointment in August with Dr Shirlee Latch after the echo has been done.

## 2011-02-07 NOTE — Assessment & Plan Note (Signed)
Patient is in atrial flutter today with good rate control.  He is on coumadin.  He has had both atrial fibrillation and flutter in the past.  He has had a Maze procedure as well as attempted cardioversion on amiodarone, but has not been converted back to NSR.  Plan rate control and anticoagulation strategy.  He had a presumed cardioembolic MI in the setting of a large LA appendage thrombus.  The LA appendage was ligated.  Given the embolic event, I think that he needs to be bridged with Lovenox while off coumadin even though the appendage was ligated.  He can hold coumadin for surgery but will need to go on Lovenox when INR < 2.  He needs to resume full anticoagulation as soon as possible after knee surgery.

## 2011-02-11 ENCOUNTER — Ambulatory Visit (INDEPENDENT_AMBULATORY_CARE_PROVIDER_SITE_OTHER): Payer: Medicare Other | Admitting: *Deleted

## 2011-02-11 DIAGNOSIS — I4891 Unspecified atrial fibrillation: Secondary | ICD-10-CM

## 2011-02-14 ENCOUNTER — Telehealth: Payer: Self-pay | Admitting: *Deleted

## 2011-02-14 ENCOUNTER — Telehealth: Payer: Self-pay | Admitting: Cardiology

## 2011-02-14 DIAGNOSIS — I4891 Unspecified atrial fibrillation: Secondary | ICD-10-CM

## 2011-02-14 MED ORDER — WARFARIN SODIUM 5 MG PO TABS: 5.0000 mg | ORAL_TABLET | ORAL | Status: DC

## 2011-02-14 NOTE — Telephone Encounter (Signed)
Needs refill warfarin 5mg  at [facility] pharmacy pt increased to 2  every day except thursday

## 2011-02-20 NOTE — Progress Notes (Signed)
Addended by: Judithe Modest on: 02/20/2011 04:19 PM   Modules accepted: Orders

## 2011-02-26 ENCOUNTER — Other Ambulatory Visit: Payer: Self-pay | Admitting: *Deleted

## 2011-02-26 DIAGNOSIS — I4891 Unspecified atrial fibrillation: Secondary | ICD-10-CM

## 2011-02-26 MED ORDER — WARFARIN SODIUM 5 MG PO TABS: ORAL_TABLET | ORAL | Status: DC

## 2011-03-11 ENCOUNTER — Ambulatory Visit (INDEPENDENT_AMBULATORY_CARE_PROVIDER_SITE_OTHER): Payer: Medicare Other | Admitting: *Deleted

## 2011-03-11 DIAGNOSIS — I4891 Unspecified atrial fibrillation: Secondary | ICD-10-CM

## 2011-03-11 DIAGNOSIS — Z7901 Long term (current) use of anticoagulants: Secondary | ICD-10-CM | POA: Insufficient documentation

## 2011-03-11 LAB — POCT INR: INR: 3.4

## 2011-03-20 ENCOUNTER — Telehealth: Payer: Self-pay | Admitting: Cardiology

## 2011-03-20 NOTE — Telephone Encounter (Signed)
I talked with pt by telephone. Pt states he went for work-up prior to knee surgery 04/01/11. He was told he had congestion in his lung that may be a respiratory infection. Pt has a history of pneumonia 2 years ago.  Pt states he has been coughing up yellow phlegm. Pt has an appt to see his PCP 03/22/11. Pt denies any other symptoms. Pt will call after he sees his PCP if his PCP feels pt needs to see Dr Shirlee Latch again  before surgery is done. Pt will notify the coumadin clinic if he is given an antibiotic and if any change in the surgery date. Pt to be bridged with Lovenox and is aware of this.

## 2011-03-20 NOTE — Telephone Encounter (Addendum)
Pt's wife calling re pt's surgery for knee replacement due to congestion, seeing pcp 5-11 but wife concerned it may be heart related and wants to know if dr Shirlee Latch needs to seen him now- Wife called back to add cell [phone]

## 2011-03-21 ENCOUNTER — Other Ambulatory Visit (HOSPITAL_COMMUNITY): Payer: Self-pay | Admitting: Family Medicine

## 2011-03-21 ENCOUNTER — Ambulatory Visit (HOSPITAL_COMMUNITY)
Admission: RE | Admit: 2011-03-21 | Discharge: 2011-03-21 | Disposition: A | Discharge status: Home / Self Care | Payer: Medicare Other | Source: Ambulatory Visit | Attending: Family Medicine | Admitting: Family Medicine

## 2011-03-21 DIAGNOSIS — IMO0001 Reserved for inherently not codable concepts without codable children: Secondary | ICD-10-CM

## 2011-03-21 DIAGNOSIS — F17200 Nicotine dependence, unspecified, uncomplicated: Secondary | ICD-10-CM

## 2011-03-21 DIAGNOSIS — R059 Cough, unspecified: Secondary | ICD-10-CM

## 2011-03-21 DIAGNOSIS — R05 Cough: Secondary | ICD-10-CM | POA: Insufficient documentation

## 2011-03-25 ENCOUNTER — Encounter (INDEPENDENT_AMBULATORY_CARE_PROVIDER_SITE_OTHER): Payer: Medicare Other | Admitting: *Deleted

## 2011-03-25 DIAGNOSIS — I4891 Unspecified atrial fibrillation: Secondary | ICD-10-CM

## 2011-03-25 LAB — POCT INR: INR: 2.6

## 2011-03-26 ENCOUNTER — Encounter (HOSPITAL_COMMUNITY)
Admission: RE | Admit: 2011-03-26 | Discharge: 2011-03-26 | Disposition: A | Discharge status: Home / Self Care | Payer: Medicare Other | Source: Ambulatory Visit | Attending: Orthopedic Surgery | Admitting: Orthopedic Surgery

## 2011-03-26 ENCOUNTER — Ambulatory Visit (INDEPENDENT_AMBULATORY_CARE_PROVIDER_SITE_OTHER): Payer: Medicare Other | Admitting: *Deleted

## 2011-03-26 VITALS — Wt 185.4 lb

## 2011-03-26 DIAGNOSIS — I4891 Unspecified atrial fibrillation: Secondary | ICD-10-CM

## 2011-03-26 LAB — COMPREHENSIVE METABOLIC PANEL
ALT: 11 U/L (ref 0–53)
AST: 14 U/L (ref 0–37)
Albumin: 3.5 g/dL (ref 3.5–5.2)
Alkaline Phosphatase: 80 U/L (ref 39–117)
GFR calc Af Amer: >60 mL/min (ref >60)
Glucose, Bld: 111 mg/dL — ABNORMAL HIGH (ref 70–99)
Potassium: 4.7 mEq/L (ref 3.5–5.1)
Sodium: 136 mEq/L (ref 135–145)
Total Protein: 7.2 g/dL (ref 6.0–8.3)

## 2011-03-26 LAB — DIFFERENTIAL
Basophils Absolute: 0 10*3/uL (ref 0.0–0.1)
Basophils Relative: 0 % (ref 0–1)
Eosinophils Absolute: 0 10*3/uL (ref 0.0–0.7)
Eosinophils Relative: 0 % (ref 0–5)
Monocytes Absolute: 0.9 10*3/uL (ref 0.1–1.0)

## 2011-03-26 LAB — URINALYSIS, ROUTINE W REFLEX MICROSCOPIC
Bilirubin Urine: NEGATIVE
Glucose, UA: NEGATIVE mg/dL
Hgb urine dipstick: NEGATIVE
Specific Gravity, Urine: 1.014 (ref 1.005–1.030)
Urobilinogen, UA: 1 mg/dL (ref 0.0–1.0)

## 2011-03-26 LAB — SURGICAL PCR SCREEN
MRSA, PCR: NEGATIVE
Staphylococcus aureus: NEGATIVE

## 2011-03-26 LAB — CBC
HCT: 38.9 % — ABNORMAL LOW (ref 39.0–52.0)
MCHC: 34.4 g/dL (ref 30.0–36.0)
Platelets: 243 10*3/uL (ref 150–400)
RDW: 13.8 % (ref 11.5–15.5)

## 2011-03-26 LAB — PROTIME-INR: INR: 1.92 — ABNORMAL HIGH (ref 0.00–1.49)

## 2011-03-26 LAB — TYPE AND SCREEN: ABO/RH(D): B POS

## 2011-03-26 MED ORDER — ENOXAPARIN SODIUM 120 MG/0.8ML ~~LOC~~ SOLN: 120.0000 mg | SUBCUTANEOUS | Status: DC

## 2011-03-26 NOTE — Assessment & Plan Note (Signed)
OFFICE VISIT   Tyler, Galloway  DOB:  1946/04/18                                        May 31, 2010  CHART #:  [id number]   HISTORY:  The patient returns to the office today in follow up after his  recent presentation in June to [facility] with acute myocardial  infarction, V-fib arrest, ultimately was found to have severe aortic  stenosis, evidence of left atrial clot which had embolized down his  circumflex coronary artery.  On May 01, 2010, he underwent aortic valve  replacement, removal of left atrial clot, radiofrequency maze, and  suture closure of left atrial appendage.  He has done well  postoperatively.  He is now interested in starting cardiac rehab.  He  has had no evidence of angina or heart failure.  He notes that his  overall respiratory status is much improved from the preop wheezing and  shortness of breath that he has had for months.  He saw Dr. Juanda Chance last  week and was back in atrial fibrillation and on Coumadin.  As his was  discharged home, his INR has been between 21.8 and 2.0.  He notes that  he is walking at least half a mile a day and wishes to increase it.   PHYSICAL EXAMINATION:  His blood pressure 127/86, pulse is irregular at  86, probably still in atrial fib, respiratory rate is 18, O2 sat is 99%.  His sternum is stable and well healed.  His lungs are clear bilaterally.  Cardiac exam reveals irregularly regular rate.  I do not appreciate any  murmur of aortic insufficiency.  He has no pedal edema.  Sternal  incisions well healed.   DIAGNOSTIC TESTS:  Followup chest x-ray shows clear lung fields  bilaterally.   I have allowed the patient to return to driving.  He will enroll in the  cardiac rehab program in [place].   MEDICATIONS:  He continues on amiodarone 200 mg twice daily, aspirin 81  mg a day, iron 150 mg a day, metoprolol 12.5 twice a day, Crestor 40 mg  a day, Ultram p.r.n. for pain, Coumadin as directed by  Dr. Regino Schultze  office, and allopurinol 100 mg a day.   ASSESSMENT AND PLAN:  I have again reviewed with him the need for dental  antibiotic prophylaxis because of his valve.  We have discussed with him  waiting about 3 months postop, and if he still in atrial fib attempt  cardioversion.  He has a followup appointment with Dr. Juanda Chance in several  weeks.  I plan to see him back in 3-4 months or at Dr. Regino Schultze request  at anytime.   Sheliah Plane, MD  Electronically Signed   EG/MEDQ  D:  05/31/2010  T:  05/31/2010  Job:  [id number]   cc:   Everardo Beals. Juanda Chance, MD, [facility]  Patrica Duel, M.D.

## 2011-03-26 NOTE — Assessment & Plan Note (Signed)
OFFICE VISIT   [name], [name]  DOB:  July 04, 1946                                        September 27, 2010  CHART #:  [id number]   HISTORY:  The patient presented with acute myocardial infarction, had a  large intra-atrial clot; and on May 01, 2010, had aortic valve  replacement with pericardial tissue valve, removal of left atrial clot,  radiofrequency maze procedure, and suture closure of left atrial  appendage.  He has done well postoperatively.  He has been maintained on  Coumadin.  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  He returned to atrial fibrillation in August  and had a cardioversion at that time.  He comes to the office today in  followup visit without any significant complaint of heart failure.   PHYSICAL EXAMINATION:  Today, his blood pressure is 135/91, 129/86 in  the left arm, pulse is 68 and irregular, respiratory rate 16, and O2  sats 98%.  Rhythm strip shows atrial flutter.  His lungs are clear.  Sternum is stable and well healed.  I do not appreciate any murmur of  aortic insufficiency.  He has no pedal edema.  His weight has increased  to 188 pounds from 161 when he was discharged home.   IMPRESSION:  Overall, I am very pleased with his progress.  Unfortunately, he appears to now be in atrial flutter.  He is to see Dr.  Juanda Chance on Monday.  The options at this point would be to consider repeat  cardioversion versus a right-sided atrial flutter ablation.  He is to  see Dr. Juanda Chance early next week for followup.  I will plan to see him  back in 4 months.   Sheliah Plane, MD  Electronically Signed   EG/MEDQ  D:  09/27/2010  T:  09/27/2010  Job:  [id number]   cc:   Everardo Beals. Juanda Chance, MD, [facility]

## 2011-03-26 NOTE — Patient Instructions (Addendum)
Stop taking Coumadin on Wednesday, May 16th.   Start Lovenox injections on Thursday, May 17th.   Use Lovenox injection daily through Sunday, May 20th.   Do no take Lovenox on the day of your procedure.   Lovenox 120 mg daily subcutaneous injection: 1.) Wash hands 2.) Clean the area on the abdomen (go 1.5 inches out from the belly button on either side) 3.) Take the cap off of the syringe to expose the needle 4.) Pinch up skin and inject straight in at a 90 degree ankle 5.) Press plunger on syringe all the way in 6.) Hold for a second, then pull straight out 7.) Do not rub or massage area

## 2011-03-27 LAB — URINE CULTURE
Colony Count: NO GROWTH
Culture  Setup Time: 201205151256

## 2011-03-29 NOTE — Op Note (Signed)
NAME:  [name], [name]                           ACCOUNT NO.:  [id number]   MEDICAL RECORD NO.:  [id number]                   PATIENT TYPE:  AMB   LOCATION:  DAY                                  FACILITY:  [facility]   PHYSICIAN:  R. Roetta Sessions, M.D.              DATE OF BIRTH:  1945/12/17   DATE OF PROCEDURE:  DATE OF DISCHARGE:                                 OPERATIVE REPORT   PROCEDURE:  Colonoscopy.   ENDOSCOPIST:  Gerrit Friends. Rourk, M.D.   INDICATIONS FOR PROCEDURE:  The patient is a 65 year old gentleman, sent  over at the courtesy of Dr. Patrica Duel for colonoscopy.  [name] has  never had a colonoscopy.  He really has not had any bowel symptoms aside  from his single bout of hematochezia sometime ago in the setting of  constipation.  No family history of colorectal neoplasia.  Colonoscopy is  now being done.  This approach has been discussed with the patient at  length.  The potential risks, benefits, and alternatives have been reviewed;  questions answered.  He is agreeable.  Please see my handwritten H&P.   PROCEDURE NOTE:  O2 saturation, blood pressure, pulse and respirations were  monitored throughout the entire procedure.  Conscious sedation: Versed 4 mg IV, Demerol 100 mg IV in divided doses.   INSTRUMENT:  Olympus video chip adult colonoscope.   FINDINGS:  A digital rectal exam revealed no abnormalities.   ENDOSCOPIC FINDINGS:  The prep was good.   RECTUM:  Examination of the rectal mucosa including the retroflex view of  the anal verge revealed no abnormalities aside from internal hemorrhoids.   COLON:  The colonic mucosa was surveyed from the rectosigmoid junction  through the left transverse and right colon to the area of the appendiceal  orifice, ileocecal valve, and cecum.  These structures were well seen and  photographed for the record.   From this level the scope was slowly withdrawn.  All previously mentioned  mucosal surfaces were again seen.  The  patient was noted to have pancolonic  diverticula.  The remainder of the colonic mucosa appeared normal.  The  patient tolerated the procedure well and was reacted in endoscopy.   IMPRESSION:  1. Internal hemorrhoids otherwise normal rectum.  2. Pancolonic diverticula otherwise normal colon.   RECOMMENDATIONS:  1. Diverticulosis literature provided to the patient.  2. Repeat colonoscopy in 10 years.      ___________________________________________                                            Jonathon Bellows, M.D.   RMR/MEDQ  D:  03/20/2004  T:  03/20/2004  Job:  [id number]   cc:   Patrica Duel, M.D.  [address]  [place]  [place]  [address]  Fax: [phone]   R. Roetta Sessions, M.D.  [address]  [place]  [place] [address]  Fax: [phone]

## 2011-04-01 ENCOUNTER — Inpatient Hospital Stay (HOSPITAL_COMMUNITY)
Admission: RE | Admit: 2011-04-01 | Discharge: 2011-04-04 | DRG: 470 | Disposition: A | Discharge status: Home Health | Payer: Medicare Other | Source: Ambulatory Visit | Attending: Orthopedic Surgery | Admitting: Orthopedic Surgery

## 2011-04-01 DIAGNOSIS — L03119 Cellulitis of unspecified part of limb: Secondary | ICD-10-CM | POA: Diagnosis not present

## 2011-04-01 DIAGNOSIS — J449 Chronic obstructive pulmonary disease, unspecified: Secondary | ICD-10-CM | POA: Diagnosis present

## 2011-04-01 DIAGNOSIS — E871 Hypo-osmolality and hyponatremia: Secondary | ICD-10-CM | POA: Diagnosis not present

## 2011-04-01 DIAGNOSIS — I2510 Atherosclerotic heart disease of native coronary artery without angina pectoris: Secondary | ICD-10-CM | POA: Diagnosis present

## 2011-04-01 DIAGNOSIS — I10 Essential (primary) hypertension: Secondary | ICD-10-CM | POA: Diagnosis present

## 2011-04-01 DIAGNOSIS — I252 Old myocardial infarction: Secondary | ICD-10-CM

## 2011-04-01 DIAGNOSIS — I4891 Unspecified atrial fibrillation: Secondary | ICD-10-CM | POA: Diagnosis present

## 2011-04-01 DIAGNOSIS — M25069 Hemarthrosis, unspecified knee: Secondary | ICD-10-CM | POA: Diagnosis not present

## 2011-04-01 DIAGNOSIS — J4489 Other specified chronic obstructive pulmonary disease: Secondary | ICD-10-CM | POA: Diagnosis present

## 2011-04-01 DIAGNOSIS — Z954 Presence of other heart-valve replacement: Secondary | ICD-10-CM

## 2011-04-01 DIAGNOSIS — Z9861 Coronary angioplasty status: Secondary | ICD-10-CM

## 2011-04-01 DIAGNOSIS — K5900 Constipation, unspecified: Secondary | ICD-10-CM | POA: Diagnosis not present

## 2011-04-01 DIAGNOSIS — I4892 Unspecified atrial flutter: Secondary | ICD-10-CM | POA: Diagnosis present

## 2011-04-01 DIAGNOSIS — Z87891 Personal history of nicotine dependence: Secondary | ICD-10-CM

## 2011-04-01 DIAGNOSIS — Z951 Presence of aortocoronary bypass graft: Secondary | ICD-10-CM

## 2011-04-01 DIAGNOSIS — Z7982 Long term (current) use of aspirin: Secondary | ICD-10-CM

## 2011-04-01 DIAGNOSIS — M109 Gout, unspecified: Secondary | ICD-10-CM | POA: Diagnosis present

## 2011-04-01 DIAGNOSIS — M1710 Unilateral primary osteoarthritis, unspecified knee: Principal | ICD-10-CM | POA: Diagnosis present

## 2011-04-01 DIAGNOSIS — Z01812 Encounter for preprocedural laboratory examination: Secondary | ICD-10-CM

## 2011-04-01 DIAGNOSIS — D62 Acute posthemorrhagic anemia: Secondary | ICD-10-CM | POA: Diagnosis not present

## 2011-04-01 DIAGNOSIS — Z7901 Long term (current) use of anticoagulants: Secondary | ICD-10-CM

## 2011-04-01 DIAGNOSIS — L02419 Cutaneous abscess of limb, unspecified: Secondary | ICD-10-CM | POA: Diagnosis not present

## 2011-04-02 HISTORY — PX: OTHER SURGICAL HISTORY: SHX169

## 2011-04-02 LAB — BASIC METABOLIC PANEL
CO2: 29 mEq/L (ref 19–32)
Calcium: 8.6 mg/dL (ref 8.4–10.5)
GFR calc Af Amer: >60 mL/min (ref >60)
GFR calc non Af Amer: >60 mL/min (ref >60)
Glucose, Bld: 121 mg/dL — ABNORMAL HIGH (ref 70–99)
Potassium: 4.1 mEq/L (ref 3.5–5.1)
Sodium: 136 mEq/L (ref 135–145)

## 2011-04-02 LAB — CBC
Hemoglobin: 10.5 g/dL — ABNORMAL LOW (ref 13.0–17.0)
MCH: 34.3 pg — ABNORMAL HIGH (ref 26.0–34.0)
MCV: 103.3 fL — ABNORMAL HIGH (ref 78.0–100.0)
RBC: 3.06 MIL/uL — ABNORMAL LOW (ref 4.22–5.81)
WBC: 10.3 10*3/uL (ref 4.0–10.5)

## 2011-04-02 NOTE — Op Note (Signed)
NAMEIWAO, SHAMBLIN                 ACCOUNT NO.:  [id number]  MEDICAL RECORD NO.:  [id number]           PATIENT TYPE:  I  LOCATION:  6732                         FACILITY:  [facility]  PHYSICIAN:  Diago Haik A. Thurston Hole, M.D. DATE OF BIRTH:  1946-08-22  DATE OF PROCEDURE:  04/01/2011 DATE OF DISCHARGE:                              OPERATIVE REPORT   PREOPERATIVE DIAGNOSIS:  Right knee degenerative joint disease.  POSTOPERATIVE DIAGNOSIS:  Right knee degenerative joint disease.  PROCEDURE: 1. Right total knee replacement using DePuy cemented total knee system     with #4 cemented femur, #5 cemented tibia with 15-mm polyethylene     RP tibial spacer and 38-mm polyethylene cemented patella. 2. Zinacef impregnated cement.  SURGEON:  Elana Alm. Thurston Hole, MD  ASSISTANT:  Julien Girt, PA-C  ANESTHESIA:  General.  OPERATIVE TIME:  1 hour and 40 minutes.  COMPLICATIONS:  None.  DESCRIPTION OF PROCEDURE:  Mr. Yom was brought to the operating room on Apr 01, 2011, after a femoral nerve block was placed in the holding room by Anesthesia.  He was placed on operative table supine position.  After being placed under general anesthesia, his right knee was examined.  He had range of motion from minus 10-125 degrees, significant varus deformity, knee stable to varus and valgus stress with normal patellar tracking.  He had a Foley catheter placed under sterile conditions.  He received Ancef 2 g IV preoperatively for prophylaxis.  His right leg was then prepped using sterile DuraPrep and draped using sterile technique. Time-out procedure was called and the correct right knee identified. Initially, the right leg was exsanguinated and a thigh tourniquet elevated at 365 mm.  Initially, through a 15-cm longitudinal incision based over the patella, initial exposure was made.  The underlying subcutaneous tissues were incised along with skin incision.  A median arthrotomy was performed revealing an  excessive amount of normal- appearing joint fluid.  The articular surfaces were inspected.  He had grade 4 changes medially, laterally and in the patellofemoral joint.  He had a large amount of gouty infiltrate in the soft tissues as well.  The medial and lateral meniscal remnants were removed as well as the anterior cruciate ligament.  Portions of the MCL were calcified and portions of this calcifications were slightly debrided but the MCL integrity remained intact.  Intramedullary drill was then drilled up the femoral canal for placement of the distal femoral cutting jig which was placed in the appropriate manner of rotation and a distal 12-mm cut was made.  The distal femur was incised.  #4 was found to be the appropriate size and #4 cutting jig was placed in the appropriate manner of external rotation and then these cuts were made.  Proximal tibia was then exposed.  The tibial spines were removed with an oscillating saw. Intramedullary drill was drilled down the tibial canal for placement of proximal tibial cutting jig which was placed in the appropriate manner of rotation and a zero cut was made based off the medial or deficient side resecting 10 mm off the lateral side.  At this point, the spacer  blocks were placed in flexion and extension and 15-mm blocks gave excellent balancing, excellent stability, and excellent correction of his flexion and varus deformities.  #5 tibial baseplate trial was placed on the cut tibial surface and the keel cut was made.  The PCL box cutter was placed on the distal femur and these cuts were made.  There was a large cyst in the medial femoral condyle and this would be filled with cement at the time of cementing.  At this point with #5 tibial baseplate trial in place and #4 femoral trial was placed and then a 15-mm polyethylene RP tibial spacer, knee was reduced, taken through range of motion from 0-125 degrees with excellent stability and  excellent correction of his flexion and varus deformities and normal patellar tracking.  A resurfacing 9-mm cut was made on patella and three locking holes placed for a 38-mm patellar trial and again patellofemoral tracking was evaluated and found to be normal.  At this point, it was felt that all trial components were of excellent size, fit and stability.  They were then removed.  The knee was then jet lavage irrigated with 3 liters of saline.  The proximal tibia was then exposed. A #5 tibial baseplate with Zinacef impregnated cement backing was hammered into position with an excellent fit with excess cement being removed from around the edges.  #4 femoral component with cement backing was hammered into position also with an excellent fit with excess cement being removed from around the edges with this cystic defect in the medial femoral condyle filled with cement but there was excellent cortical bone integrity around it.  The 15-mm polyethylene RP tibial spacer was placed on tibial baseplate.  The knee reduced, taken through range of motion from 0-125 degrees with excellent stability and excellent correction of his flexion and varus deformities.  The 38-mm polyethylene cement backed patella was then placed in its position and held there with a clamp.  After the cement hardened, again patellofemoral tracking was evaluated and found to be normal.  At this point, it was felt that all the components were of excellent size, fit and stability.  The knee was further irrigated with saline.  Tourniquet was released.  Hemostasis obtained with cautery.  The arthrotomy was then closed with #1 Ethibond suture over two medium Hemovac drains. Subcutaneous tissues closed with 0 and 2-0 Vicryl, subcuticular layer closed with 4-0 Monocryl.  Sterile dressings and a long-leg splint applied, then the patient awakened and taken to recovery in stable condition.  Needle and sponge counts correct x2 at the end  of the case. Neurovascular status normal.  Pulses 2+ and symmetric.     Lukisha Procida A. Thurston Hole, M.D.     RAW/MEDQ  D:  04/01/2011  T:  04/02/2011  Job:  [id number]  Electronically Signed by Salvatore Marvel M.D. on 04/02/2011 05:29:42 PM

## 2011-04-03 LAB — CBC
HCT: 26.3 % — ABNORMAL LOW (ref 39.0–52.0)
Hemoglobin: 8.8 g/dL — ABNORMAL LOW (ref 13.0–17.0)
MCH: 34.5 pg — ABNORMAL HIGH (ref 26.0–34.0)
MCHC: 33.5 g/dL (ref 30.0–36.0)
RBC: 2.55 MIL/uL — ABNORMAL LOW (ref 4.22–5.81)

## 2011-04-03 LAB — BASIC METABOLIC PANEL
BUN: 11 mg/dL (ref 6–23)
Chloride: 104 mEq/L (ref 96–112)
GFR calc non Af Amer: >60 mL/min (ref >60)
Potassium: 4.5 mEq/L (ref 3.5–5.1)
Sodium: 135 mEq/L (ref 135–145)

## 2011-04-03 LAB — PROTIME-INR
INR: 1.27 (ref 0.00–1.49)
Prothrombin Time: 16.1 seconds — ABNORMAL HIGH (ref 11.6–15.2)

## 2011-04-04 LAB — CBC
MCH: 32.1 pg (ref 26.0–34.0)
MCHC: 34 g/dL (ref 30.0–36.0)
MCV: 94.6 fL (ref 78.0–100.0)
Platelets: 141 10*3/uL — ABNORMAL LOW (ref 150–400)
RDW: 19.6 % — ABNORMAL HIGH (ref 11.5–15.5)

## 2011-04-04 LAB — BASIC METABOLIC PANEL
BUN: 9 mg/dL (ref 6–23)
Calcium: 8.7 mg/dL (ref 8.4–10.5)
Chloride: 99 mEq/L (ref 96–112)
Creatinine, Ser: 0.81 mg/dL (ref 0.4–1.5)
GFR calc Af Amer: >60 mL/min (ref >60)
GFR calc non Af Amer: >60 mL/min (ref >60)

## 2011-04-04 LAB — CROSSMATCH
ABO/RH(D): B POS
Unit division: 0

## 2011-04-04 LAB — PROTIME-INR: Prothrombin Time: 16.9 seconds — ABNORMAL HIGH (ref 11.6–15.2)

## 2011-04-09 ENCOUNTER — Ambulatory Visit: Payer: Self-pay | Admitting: *Deleted

## 2011-04-09 DIAGNOSIS — I4891 Unspecified atrial fibrillation: Secondary | ICD-10-CM

## 2011-04-09 LAB — POCT INR
INR: 1.6
INR: 2.3

## 2011-04-11 ENCOUNTER — Ambulatory Visit: Payer: Self-pay | Admitting: *Deleted

## 2011-04-11 DIAGNOSIS — I4891 Unspecified atrial fibrillation: Secondary | ICD-10-CM

## 2011-04-11 LAB — POCT INR: INR: 2.8

## 2011-04-15 ENCOUNTER — Ambulatory Visit: Payer: Self-pay | Admitting: *Deleted

## 2011-04-15 DIAGNOSIS — I4891 Unspecified atrial fibrillation: Secondary | ICD-10-CM

## 2011-04-24 ENCOUNTER — Ambulatory Visit (INDEPENDENT_AMBULATORY_CARE_PROVIDER_SITE_OTHER): Payer: Medicare Other | Admitting: *Deleted

## 2011-04-24 DIAGNOSIS — I4891 Unspecified atrial fibrillation: Secondary | ICD-10-CM

## 2011-05-16 ENCOUNTER — Ambulatory Visit (INDEPENDENT_AMBULATORY_CARE_PROVIDER_SITE_OTHER): Payer: Medicare Other | Admitting: *Deleted

## 2011-05-16 DIAGNOSIS — I4891 Unspecified atrial fibrillation: Secondary | ICD-10-CM

## 2011-05-16 LAB — POCT INR: INR: 3.2

## 2011-05-21 ENCOUNTER — Telehealth: Payer: Self-pay | Admitting: Cardiology

## 2011-05-22 NOTE — Discharge Summary (Signed)
[name], [name]                 ACCOUNT NO.:  [id number]  MEDICAL RECORD NO.:  [id number]           PATIENT TYPE:  I  LOCATION:  5006                         FACILITY:  [facility]  PHYSICIAN:  Ishika Chesterfield A. Thurston Galloway, M.D. DATE OF BIRTH:  Jan 28, 1946  DATE OF ADMISSION:  04/01/2011 DATE OF DISCHARGE:  04/04/2011                              DISCHARGE SUMMARY   ADMITTING DIAGNOSES: 1. End-stage degenerative joint disease, right knee. 2. Hypertension. 3. Gout. 4. Coronary artery disease with a history of myocardial infarction     secondary to a lethal arrhythmia on April 24, 2010. 5. Aortic valve replacement on May 01, 2010, with clot removal that     caused his fatal cardiac arrhythmia.  DISCHARGE DIAGNOSES: 1. End-stage degenerative joint disease, right knee. 2. Cellulitis. 3. Acute blood loss anemia. 4. Acute hemarthrosis postoperatively in his right knee. 5. Hyponatremia. 6. Gout. 7. Hypertension. 8. Coronary artery disease with a history of myocardial infarction. 9. Aortic valve replacement.  HISTORY OF PRESENT ILLNESS:  The patient is a 65 year old white male with a history of end-stage DJD of his right knee.  He failed conservative care including intraarticular cortisone injections.  Risks, benefits, and possible complications of a right total knee replacement were discussed in detail with the patient.  He had an acute gouty flare of his ankle that was treated with prednisone.  PROCEDURES IN-HOUSE:  On Apr 01, 2011, the patient underwent a right total knee replacement by Dr. Thurston Galloway, right femoral nerve block by Anesthesia and Autovac transfusion.  He tolerated all of these well.  On postop day #1, the patient did well with Physical Therapy and was very cooperative.  His hemoglobin was slightly decreased at 10.5.  He has had an irregular rate and rhythm that was stable with a rate that was controlled.  He was given two 500 mL fluid boluses.  His drain was discontinued.  On  postop day #2, the patient did very well with Physical Therapy, however, developed acute hemarthrosis in the evening of postop day #2.  His hemoglobin was low at 8.8, and he was transfused 2 units of packed red blood cells.  In the setting of this, following the transfusion, his hemarthrosis worsened, so he was kept 1 more day. Hemoglobin on postop day #3 was 8.9.  INR was 1.3.  Temperature was 100.6.  He was given Ancef 2 g IV in the hospital on the morning and 1 g IV before discharge and is being discharged home on Keflex 500 mg q.i.d. due to his cellulitis in the setting of acute hemarthrosis following a total knee replacement.  He will get Home Health Physical Therapy.  He will get daily dressing changes and will use a CPM 0 to 70 degrees.  He is weightbearing as tolerated, on a regular diet.  He will call with increased pain, increased redness, increased swelling, or a temperature greater than 101.  DISCHARGE MEDICATIONS: 1. Colchicine 0.6 mg twice a day if needed for acute gouty attack,     ankle is currently asymptomatic. 2. Colace 100 mg 1 tablet twice daily while on narcotics. 3. Lovenox  30 mg subcu twice a day.  This is a lower dose due to his     acute hemarthrosis. 4. Keflex 500 mg 1 tablet 4 times a day. 5. Percocet 5/325, 1-2 tablets every 4-6 hours as needed for pain. 6. Aspirin 81 mg 1 tablet daily. 7. Coumadin 10 mg 2 tablets daily.  He has been instructed to stop his allopurinol right now due to his acute gouty attack, that is now since improved since he started colchicine.  His cardiologist has stopped his amiodarone.  We have stopped his acetaminophen because he is taking Percocet for pain and the other medication that he will continue.  His metoprolol XL 50 mg 1 tablet twice a day.  He will get daily physical therapy with home health.  He will use a CPM and elevate his heel.  He has been instructed to clean his leg daily with chlorhexidine wipes and these are  given.  He has been instructed to wash with soap and water.  We will see him back in the office on Tuesday, Apr 09, 2011, to check his wound.     Kirstin Shepperson, PA-C   ______________________________ [name], M.D.    KS/MEDQ  D:  04/04/2011  T:  04/04/2011  Job:  [id number]  Electronically Signed by Julien Girt P.A. on 05/12/2011 07:09:15 PM Electronically Signed by Salvatore Marvel M.D. on 05/22/2011 11:25:33 AM

## 2011-05-29 ENCOUNTER — Ambulatory Visit (HOSPITAL_COMMUNITY)
Admission: RE | Admit: 2011-05-29 | Discharge: 2011-05-29 | Disposition: A | Discharge status: Home / Self Care | Payer: Medicare Other | Source: Ambulatory Visit | Attending: Family Medicine | Admitting: Family Medicine

## 2011-05-29 ENCOUNTER — Other Ambulatory Visit (HOSPITAL_COMMUNITY): Payer: Self-pay | Admitting: Family Medicine

## 2011-05-29 DIAGNOSIS — J40 Bronchitis, not specified as acute or chronic: Secondary | ICD-10-CM

## 2011-05-29 DIAGNOSIS — J4489 Other specified chronic obstructive pulmonary disease: Secondary | ICD-10-CM | POA: Insufficient documentation

## 2011-05-29 DIAGNOSIS — J449 Chronic obstructive pulmonary disease, unspecified: Secondary | ICD-10-CM | POA: Insufficient documentation

## 2011-05-29 DIAGNOSIS — Z87891 Personal history of nicotine dependence: Secondary | ICD-10-CM | POA: Insufficient documentation

## 2011-06-06 ENCOUNTER — Encounter: Payer: Self-pay | Admitting: Cardiology

## 2011-06-06 ENCOUNTER — Telehealth: Payer: Self-pay | Admitting: Cardiology

## 2011-06-06 ENCOUNTER — Ambulatory Visit (INDEPENDENT_AMBULATORY_CARE_PROVIDER_SITE_OTHER): Payer: Medicare Other | Admitting: *Deleted

## 2011-06-06 DIAGNOSIS — I4891 Unspecified atrial fibrillation: Secondary | ICD-10-CM

## 2011-06-06 NOTE — Telephone Encounter (Signed)
Pt's wife was in with him last visit, though she saw a sign for testing that we do to determine if a pt has a heart problem, wanted to know if she read this correctly and what the test would involve

## 2011-06-06 NOTE — Telephone Encounter (Signed)
I talked with pt's wife. Pt's wife had questions about vascular screening package offered here. I transferred pt to Missy.

## 2011-06-10 ENCOUNTER — Ambulatory Visit (HOSPITAL_COMMUNITY): Payer: Medicare Other | Attending: Cardiology | Admitting: Radiology

## 2011-06-10 ENCOUNTER — Ambulatory Visit (INDEPENDENT_AMBULATORY_CARE_PROVIDER_SITE_OTHER): Payer: Medicare Other | Admitting: Cardiology

## 2011-06-10 ENCOUNTER — Encounter: Payer: Self-pay | Admitting: Cardiology

## 2011-06-10 DIAGNOSIS — I359 Nonrheumatic aortic valve disorder, unspecified: Secondary | ICD-10-CM

## 2011-06-10 DIAGNOSIS — I2510 Atherosclerotic heart disease of native coronary artery without angina pectoris: Secondary | ICD-10-CM

## 2011-06-10 DIAGNOSIS — I4891 Unspecified atrial fibrillation: Secondary | ICD-10-CM | POA: Insufficient documentation

## 2011-06-10 DIAGNOSIS — I252 Old myocardial infarction: Secondary | ICD-10-CM | POA: Insufficient documentation

## 2011-06-10 DIAGNOSIS — Z952 Presence of prosthetic heart valve: Secondary | ICD-10-CM

## 2011-06-10 LAB — LIPID PANEL
Total CHOL/HDL Ratio: 2
Triglycerides: 113 mg/dL (ref 0.0–149.0)

## 2011-06-10 MED ORDER — LISINOPRIL 5 MG PO TABS: 5.0000 mg | ORAL_TABLET | Freq: Every day | ORAL | Status: DC

## 2011-06-10 NOTE — Patient Instructions (Signed)
Start Lisinopril 5mg  daily.  Lab today--lipid profile 427.31  414.01  Have lab in 2 -3 weeks--BMP 427.31  414.01---you have the order. Please fax the results to [phone].  Your physician wants you to follow-up in: 6 months with Dr Shirlee Latch. (January 2013).You will receive a reminder letter in the mail two months in advance. If you don't receive a letter, please call our office to schedule the follow-up appointment.

## 2011-06-10 NOTE — Progress Notes (Signed)
PCP: Dr. Megan Mans [name])  65 yo with history of atrial fibrillation, cardioembolic MI, and aortic stenosis s/p aortic valve replacement presents for cardiology followup.  He had his right knee replacement in 5/12 with no complications.  He has finished PT and is now doing some walking for exercise.  No chest pain, no exertional dyspnea.  He remains in coarse atrial fibrillation today.  I reviewed his echo that was done today.  The bioprosthetic aortic valve is well-seated. There is mild systolic dysfunction with inferoposterior severe hypokinesis (prior inferoposterior MI).   ECG: Coarse atrial fibrillation at 63, old inferior MI.   Labs (1/12): K 4.7, creatinine 1.1, HDL 71, LDL 72 Labs (5/12): K 4.3, creatinine 0.81  Allergies (verified):  1)  ! * Diclofenac  Past Medical History: 1. Myocardial infarction: Patient had an inferoposterior MI in 6/11 complicated by ventricular fibrillation arrest.  LHC showed occlusion of OM1 and and left-sided PDA.  Other vessels showed no angiographic CAD.  This was thought to be embolic and was treated with heparin gtt.  He was actually found soon thereafter to have extensive left atrial thrombus. TEE (8/11) showed EF 50%, well-seated bioprosthetic aortic valve.  Echo (7/12) showed moderate biatrial enlargement, EF 45-50% with severe inferoposterior hypokinesis, normal RV size and systolic function, normal functioning bioprosthetic aortic valve.  2. Ventricular fibrillation arrest in setting of acute MI.  3. Aortic stenosis: Severe AS.  Patient had AV replacement with bioprosthetic aortic valve in 6/11.  4. Atrial fibrillation/flutter: Complicated by cardioembolic MI.  At time of aortic valve replacement in 6/11, was noted to have extensive left atrial thrombus.  Thrombus was removed and patient had Maze procedure and left atrial appendage ligation.  Atrial fibrillation recurred and patient had 2 cardioversions while on amiodarone, neither succeeded.  He is  now off amiodarone and rate controlled with coumadin anticoagulation.  5. Gout 6. History of pneumonia.  7. Diverticulosis.  8. Right TKR (5/12)    Family History: No premature CAD  Social History: Lives in [place] with his wife.  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  Rare ETOH.  Tobacco Use - Former. pt no longer smokes cigars quite in April 24, 2010 Smoking Status:  quit  Review of Systems        All systems reviewed and negative except as per HPI.   Current Outpatient Prescriptions  Medication Sig Dispense Refill  . acetaminophen (TYLENOL) 500 MG tablet Take 500 mg by mouth as needed. For knee pain.       Marland Kitchen aspirin 81 MG tablet Take 81 mg by mouth daily.        Marland Kitchen HYDROcodone-acetaminophen (VICODIN ES) 7.5-750 MG per tablet Take 1 tablet by mouth Every 4 hours as needed.      . metoprolol (TOPROL-XL) 50 MG 24 hr tablet Take 50 mg by mouth 2 (two) times daily.       Marland Kitchen warfarin (COUMADIN) 5 MG tablet 10mg  daily except 5mg  on Thursdays        60 tablet  1  . lisinopril (PRINIVIL,ZESTRIL) 5 MG tablet Take 1 tablet (5 mg total) by mouth daily.  30 tablet  6    BP 136/88  Pulse 89  Resp 18  Ht 5\' 8"  (1.727 m)  Wt 185 lb 6.4 oz (84.097 kg)  BMI 28.19 kg/m2 General:  Well developed, well nourished, in no acute distress. Neck:  Neck supple, no JVD. No masses, thyromegaly or abnormal cervical nodes. Lungs:  Clear bilaterally to auscultation  and percussion. Heart:  Non-displaced PMI, chest non-tender; irregular rate and rhythm, S1, S2 without rubs or gallops. 1/6 early systolic murmur RUSB.  Carotid upstroke normal, no bruit. Pedals normal pulses. Trace ankle edema.  Abdomen:  Bowel sounds positive; abdomen soft and non-tender without masses, organomegaly, or hernias noted. No hepatosplenomegaly. Extremities:  No clubbing or cyanosis. Neurologic:  Alert and oriented x 3. Psych:  Normal affect.

## 2011-06-10 NOTE — Assessment & Plan Note (Signed)
Patient is in coarse atrial fibrillation versus atypical flutter today with good rate control.  He is on coumadin.  He has had both atrial fibrillation and flutter in the past.  He has had a Maze procedure as well as attempted cardioversion on amiodarone, but has not been converted back to NSR.  Plan rate control and anticoagulation strategy.  He had a presumed cardioembolic MI in the setting of a large LA appendage thrombus.  The LA appendage was ligated.  Given the embolic event, I think that he needs to be bridged with Lovenox while off coumadin even though the appendage was ligated.

## 2011-06-10 NOTE — Assessment & Plan Note (Addendum)
Patient actually did not have coronary plaque and it was thought that his MI was embolic from left atrial thrombus.  He is doing quite well now, NYHA class I symptoms.  EF 45-50% on echo with inferoposterior severe hypokinesis (prior inferoposterior MI).  Will check lipids/LFTs today. Given mildly decreased LV systolic function, I will start him on lisinopril 5 mg daily.  BMET in 2 weeks.

## 2011-06-10 NOTE — Assessment & Plan Note (Signed)
Bioprosthetic valve appeared normal on today's echo.

## 2011-06-11 ENCOUNTER — Telehealth: Payer: Self-pay | Admitting: Cardiology

## 2011-06-11 NOTE — Telephone Encounter (Signed)
I talked with pt about recent lab results 

## 2011-06-11 NOTE — Telephone Encounter (Signed)
Pt returned your call from yesterday.  Please call him back.

## 2011-06-12 ENCOUNTER — Other Ambulatory Visit (HOSPITAL_COMMUNITY): Payer: Medicare Other | Admitting: Radiology

## 2011-07-02 ENCOUNTER — Other Ambulatory Visit: Payer: Self-pay | Admitting: Pharmacist

## 2011-07-02 DIAGNOSIS — I4891 Unspecified atrial fibrillation: Secondary | ICD-10-CM

## 2011-07-02 MED ORDER — WARFARIN SODIUM 5 MG PO TABS: ORAL_TABLET | ORAL | Status: DC

## 2011-07-04 ENCOUNTER — Ambulatory Visit (INDEPENDENT_AMBULATORY_CARE_PROVIDER_SITE_OTHER): Payer: Medicare Other | Admitting: *Deleted

## 2011-07-04 ENCOUNTER — Encounter: Payer: Self-pay | Admitting: Cardiology

## 2011-07-04 DIAGNOSIS — I4891 Unspecified atrial fibrillation: Secondary | ICD-10-CM

## 2011-07-04 LAB — POCT INR: INR: 3

## 2011-07-25 ENCOUNTER — Ambulatory Visit: Payer: Medicare Other | Admitting: Cardiothoracic Surgery

## 2011-08-01 ENCOUNTER — Ambulatory Visit (INDEPENDENT_AMBULATORY_CARE_PROVIDER_SITE_OTHER): Payer: Medicare Other | Admitting: *Deleted

## 2011-08-01 DIAGNOSIS — I4891 Unspecified atrial fibrillation: Secondary | ICD-10-CM

## 2011-08-01 MED ORDER — WARFARIN SODIUM 5 MG PO TABS: ORAL_TABLET | ORAL | Status: DC

## 2011-08-09 ENCOUNTER — Encounter: Payer: Self-pay | Admitting: Cardiothoracic Surgery

## 2011-08-15 ENCOUNTER — Encounter: Payer: Self-pay | Admitting: Cardiothoracic Surgery

## 2011-08-15 ENCOUNTER — Ambulatory Visit (INDEPENDENT_AMBULATORY_CARE_PROVIDER_SITE_OTHER): Payer: Medicare Other | Admitting: Cardiothoracic Surgery

## 2011-08-15 VITALS — BP 123/77 | HR 77 | Resp 18 | Ht 69.0 in | Wt 184.0 lb

## 2011-08-15 DIAGNOSIS — Z954 Presence of other heart-valve replacement: Secondary | ICD-10-CM

## 2011-08-15 DIAGNOSIS — I219 Acute myocardial infarction, unspecified: Secondary | ICD-10-CM

## 2011-08-15 DIAGNOSIS — I359 Nonrheumatic aortic valve disorder, unspecified: Secondary | ICD-10-CM

## 2011-08-15 DIAGNOSIS — Z8679 Personal history of other diseases of the circulatory system: Secondary | ICD-10-CM

## 2011-08-15 DIAGNOSIS — Z9889 Other specified postprocedural states: Secondary | ICD-10-CM

## 2011-08-15 DIAGNOSIS — Z952 Presence of prosthetic heart valve: Secondary | ICD-10-CM

## 2011-08-15 NOTE — Patient Instructions (Signed)
Doing well after surgery, no changes

## 2011-08-15 NOTE — Progress Notes (Signed)
[address]            [place] [address]          [phone]       Clovia Cuff Date of Birth: [birth year]  Marca Ancona, MD Alice Reichert, MD  Chief Complaint:  Chief Complaint  Patient presents with  . Follow-up    69month f/u S//P emergency aortic valve replacemnet with pericardial tissue valve, removal of lt atrial clot, radiofrequency Maze in right and left suture closure of the left atrial appendage     History of Present Illness: The patient returns today now approximately 18 months following an acute myocardial infarction secondary to left atrial clot and arterial embolus in the setting of atrial fibrillation and simultaneously had critical aortic stenosis. In June of 2011 he underwent aortic valve replacement with a 25 magnesium pericardial tissue valve removal of left atrial clot closure of left atrial appendage and right and left maze procedure. Unfortunately over the time his continued to have atrial fibrillation and is on Coumadin. Otherwise he's been making very good progress without signs or symptoms of recurrent chest pain or congestive heart failure. He is back playing golf. He notes since last seen in a right knee replacement has been done and is much more ambulatory Past Medical History  Diagnosis Date  . Myocardial infarction   . Ventricular fibrillation   . Aortic stenosis   . Atrial fibrillation or flutter   . Gout   . History of pneumonia   . Diverticulosis   . CAD (coronary artery disease)     Past Surgical History  Procedure Date  . Right ankle pinning     Fell from tree w/fracture several years ago.  . 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 Aortic valve replacement with pericardial tissue valve, edwards 05/04/2010    History  Smoking status  . Former Smoker  . Types: Cigars  . Quit date: 04/24/2010  Smokeless tobacco  . Not on file  Comment: 1 a week    History  Alcohol Use  . Yes     Allergies  Allergen Reactions  . Diclofenac Itching  . Naproxen     itching    Current Outpatient Prescriptions  Medication Sig Dispense Refill  . acetaminophen (TYLENOL) 500 MG tablet Take 500 mg by mouth as needed. For knee pain.       Marland Kitchen aspirin 81 MG tablet Take 81 mg by mouth daily.        Marland Kitchen lisinopril (PRINIVIL,ZESTRIL) 5 MG tablet Take 1 tablet (5 mg total) by mouth daily.  30 tablet  6  . metoprolol (TOPROL-XL) 50 MG 24 hr tablet Take 50 mg by mouth 2 (two) times daily.       Marland Kitchen warfarin (COUMADIN) 5 MG tablet Take As Directed per Anticoagulation Clinic       60 tablet  2      Review of System Patient denies any constitutional symptoms denies fever chills or night sweats Denies amaurosis or TIAs Has had some bruising related to Coumadin but no history of GI bleed He's had no shortness of breath Denies pedal edema Denies psychiatric history Other review of systems are negative       Physical Exam:  BP 123/77  Pulse 77  Resp 18  Ht 5\' 9"  (1.753 m)  Wt 184 lb (83.462 kg)  BMI 27.17 kg/m2  SpO2 98%  Patient appears alert and neurologically intact I do not appreciate carotid bruits his pulses are regular his His sternum is stable without murmur of aortic insufficiency, valve sounds are crisp Abdominal exam is without palpable masses the aorta is not palpably enlarged there is evidence of recent right knee replacement well-healed   does tenderness or pedal edema   he does have some minor bruising of his right arm     Diagnostic Studies & Laboratory data:  Echocardiogram done in July 2012 is reviewed   Assessment / Plan:  Patient is doing well following his aortic valve replacement 18 months ago. Unfortunately he has persisted in atrial fibrillation. He continues on Coumadin, if he has complications of Coumadin his atrial appendage has been closed which should decrease his risk of recurrent embolic phenomenon. At this point without complications of  Coumadin he will continue on. I've not made him a return appointment to to see me but would be glad to see him in his or Dr. Kemper Durie requested anytime.  Diesha Rostad B

## 2011-08-29 ENCOUNTER — Ambulatory Visit (INDEPENDENT_AMBULATORY_CARE_PROVIDER_SITE_OTHER): Payer: Medicare Other | Admitting: *Deleted

## 2011-08-29 DIAGNOSIS — Z7901 Long term (current) use of anticoagulants: Secondary | ICD-10-CM

## 2011-08-29 DIAGNOSIS — I4891 Unspecified atrial fibrillation: Secondary | ICD-10-CM

## 2011-08-29 DIAGNOSIS — I359 Nonrheumatic aortic valve disorder, unspecified: Secondary | ICD-10-CM

## 2011-08-30 ENCOUNTER — Telehealth: Payer: Self-pay | Admitting: Cardiology

## 2011-08-30 NOTE — Telephone Encounter (Signed)
Pt calling wanting to know if pt needs to get his blood checked again, to check on pt liver. Please return pt call to discuss further.

## 2011-08-30 NOTE — Telephone Encounter (Signed)
Pt was called.  I told him that he was scheduled to see Dr Shirlee Latch in January but I didn't see any labs ordered before that time.  I told him I would forward this message to Dr Alford Highland nurse and if she felt he needed labs done before January she would call him.  No need to call unless labs are needed before January.

## 2011-09-04 NOTE — Telephone Encounter (Signed)
LMTCB

## 2011-09-05 ENCOUNTER — Telehealth: Payer: Self-pay | Admitting: Cardiology

## 2011-09-05 NOTE — Telephone Encounter (Signed)
Pt had a lipid profile 7/12. Dr Shirlee Latch recommended a repeat lipid profile in January 2013 ( 6 months after previous lipid).

## 2011-09-05 NOTE — Telephone Encounter (Signed)
I talked with pt . Pt will come for fasting  lipid profile/BMP 11/20/11 at the time of his appt with Dr Shirlee Latch.

## 2011-09-05 NOTE — Telephone Encounter (Signed)
LMTCB

## 2011-09-05 NOTE — Telephone Encounter (Signed)
Follow up to message:.  Pt has returned your call.  Will be home until 2:30.  If not at home, leave message regarding if he needs labs or not

## 2011-09-26 ENCOUNTER — Ambulatory Visit (INDEPENDENT_AMBULATORY_CARE_PROVIDER_SITE_OTHER): Payer: Medicare Other | Admitting: *Deleted

## 2011-09-26 DIAGNOSIS — I359 Nonrheumatic aortic valve disorder, unspecified: Secondary | ICD-10-CM

## 2011-09-26 DIAGNOSIS — I4891 Unspecified atrial fibrillation: Secondary | ICD-10-CM

## 2011-09-26 DIAGNOSIS — Z7901 Long term (current) use of anticoagulants: Secondary | ICD-10-CM

## 2011-10-10 NOTE — Telephone Encounter (Signed)
Please close encounter

## 2011-11-07 ENCOUNTER — Ambulatory Visit (INDEPENDENT_AMBULATORY_CARE_PROVIDER_SITE_OTHER): Payer: Medicare Other | Admitting: *Deleted

## 2011-11-07 DIAGNOSIS — I4891 Unspecified atrial fibrillation: Secondary | ICD-10-CM

## 2011-11-07 DIAGNOSIS — Z7901 Long term (current) use of anticoagulants: Secondary | ICD-10-CM

## 2011-11-07 DIAGNOSIS — I359 Nonrheumatic aortic valve disorder, unspecified: Secondary | ICD-10-CM

## 2011-11-08 ENCOUNTER — Telehealth: Payer: Self-pay | Admitting: *Deleted

## 2011-11-08 NOTE — Telephone Encounter (Signed)
INR was on low side at 2.0 yesterday.  Pt told to continue coumadin at regular dose and recheck INR when finished with Levaquin.  Will call for appt.

## 2011-11-08 NOTE — Telephone Encounter (Signed)
PT WAS PUT ON ANTIBIOTIC BY DR [facility] YESTERDAY AND NEEDS TO KNOW WHAT TO DO ABOUT COUMADIN. HE IS DOESN'T KNOW THE NAME OF DRUG THINKS IT STARTS WITH A L AND IS 500MG .   PLEASE CALL HIM AT [phone]

## 2011-11-18 ENCOUNTER — Ambulatory Visit (INDEPENDENT_AMBULATORY_CARE_PROVIDER_SITE_OTHER): Payer: Medicare Other | Admitting: *Deleted

## 2011-11-18 DIAGNOSIS — I4891 Unspecified atrial fibrillation: Secondary | ICD-10-CM

## 2011-11-18 DIAGNOSIS — Z7901 Long term (current) use of anticoagulants: Secondary | ICD-10-CM

## 2011-11-18 DIAGNOSIS — I359 Nonrheumatic aortic valve disorder, unspecified: Secondary | ICD-10-CM

## 2011-11-18 LAB — POCT INR: INR: 2.1

## 2011-11-19 ENCOUNTER — Ambulatory Visit (HOSPITAL_COMMUNITY)
Admission: RE | Admit: 2011-11-19 | Discharge: 2011-11-19 | Disposition: A | Discharge status: Home / Self Care | Payer: Medicare Other | Source: Ambulatory Visit | Attending: Family Medicine | Admitting: Family Medicine

## 2011-11-19 ENCOUNTER — Other Ambulatory Visit (HOSPITAL_COMMUNITY): Payer: Self-pay | Admitting: Family Medicine

## 2011-11-19 DIAGNOSIS — R05 Cough: Secondary | ICD-10-CM

## 2011-11-19 DIAGNOSIS — J4489 Other specified chronic obstructive pulmonary disease: Secondary | ICD-10-CM | POA: Insufficient documentation

## 2011-11-19 DIAGNOSIS — R059 Cough, unspecified: Secondary | ICD-10-CM | POA: Insufficient documentation

## 2011-11-19 DIAGNOSIS — J449 Chronic obstructive pulmonary disease, unspecified: Secondary | ICD-10-CM | POA: Insufficient documentation

## 2011-11-20 ENCOUNTER — Ambulatory Visit (INDEPENDENT_AMBULATORY_CARE_PROVIDER_SITE_OTHER): Payer: Medicare Other | Admitting: Cardiology

## 2011-11-20 ENCOUNTER — Telehealth: Payer: Self-pay | Admitting: Cardiology

## 2011-11-20 ENCOUNTER — Ambulatory Visit (INDEPENDENT_AMBULATORY_CARE_PROVIDER_SITE_OTHER): Payer: Medicare Other | Admitting: *Deleted

## 2011-11-20 ENCOUNTER — Encounter: Payer: Self-pay | Admitting: Cardiology

## 2011-11-20 VITALS — BP 110/78 | HR 75 | Ht 70.0 in | Wt 187.0 lb

## 2011-11-20 DIAGNOSIS — I359 Nonrheumatic aortic valve disorder, unspecified: Secondary | ICD-10-CM

## 2011-11-20 DIAGNOSIS — I4891 Unspecified atrial fibrillation: Secondary | ICD-10-CM

## 2011-11-20 DIAGNOSIS — I2510 Atherosclerotic heart disease of native coronary artery without angina pectoris: Secondary | ICD-10-CM

## 2011-11-20 LAB — LIPID PANEL
HDL: 54.8 mg/dL (ref >39.00)
LDL Cholesterol: 66 mg/dL (ref 0–99)
Total CHOL/HDL Ratio: 3
Triglycerides: 114 mg/dL (ref 0.0–149.0)
VLDL: 22.8 mg/dL (ref 0.0–40.0)

## 2011-11-20 LAB — BASIC METABOLIC PANEL
CO2: 23 mEq/L (ref 19–32)
Chloride: 104 mEq/L (ref 96–112)
Sodium: 137 mEq/L (ref 135–145)

## 2011-11-20 NOTE — Telephone Encounter (Signed)
New Problem:    Patient called in wanting to know what the results of his latest blood test was.  Please feel free to leave a message on his machine if you are unable to reach him.

## 2011-11-20 NOTE — Telephone Encounter (Signed)
Patient was called,left message on patient's personal voice mail.Labs have not been reviewed by Dr.MLean yet.Will call back after reviewed by Dr.

## 2011-11-20 NOTE — Patient Instructions (Signed)
Your physician recommends that you schedule a follow-up appointment in: 6 months, the office will mail a reminder letter 2 months prior appointment date. Your physician has recommended you make the following change in your medication: stop taken aspirin, continue taken Warfarin. Your physician recommends that you return for lab work in: Lipid profile today.

## 2011-11-21 NOTE — Assessment & Plan Note (Signed)
Patient is in coarse atrial fibrillation versus atypical flutter today with good rate control.  He is on coumadin.  He has had both atrial fibrillation and flutter in the past.  He has had a Maze procedure as well as attempted cardioversion on amiodarone, but has not been converted back to NSR.  Plan rate control and anticoagulation strategy.  He had a presumed cardioembolic MI in the setting of a large LA appendage thrombus.  The LA appendage was ligated.  Given the embolic event, I think that he needs to be bridged with Lovenox while off coumadin even though the appendage was ligated.  He will likely need a left TKR in the future and will need to be bridged with Lovenox off coumadin.  He can stop ASA 81 (no advantage to this since he is also on coumadin).

## 2011-11-21 NOTE — Assessment & Plan Note (Signed)
Bioprosthetic valve appeared normal on most recent echo.

## 2011-11-21 NOTE — Progress Notes (Signed)
PCP: Dr. Megan Mans [name])  66 yo with history of atrial fibrillation, cardioembolic MI, and aortic stenosis s/p aortic valve replacement presents for cardiology followup.  He had his right knee replacement in 5/12 with no complications and now needs left TKR.  No chest pain, no exertional dyspnea.  He remains in atrial flutter versus coarse atrial fibrillation today.  He has had what sounds like bronchitis with cough and congestion.  He is on levofloxacin currently.  Symptoms seem to be improving.    ECG: Coarse atrial fibrillation versus atrial flutter at 74, old inferior MI  Labs (1/12): K 4.7, creatinine 1.1, HDL 71, LDL 72 Labs (5/12): K 4.3, creatinine 0.81 Labs (7/12): LDL 47, HDL 55 Labs (8/12): K 4.5, creatinine 0.78  Allergies (verified):  1)  ! * Diclofenac  Past Medical History: 1. Myocardial infarction: Patient had an inferoposterior MI in 6/11 complicated by ventricular fibrillation arrest.  LHC showed occlusion of OM1 and and left-sided PDA.  Other vessels showed no angiographic CAD.  This was thought to be embolic and was treated with heparin gtt.  He was actually found soon thereafter to have extensive left atrial thrombus. TEE (8/11) showed EF 50%, well-seated bioprosthetic aortic valve.  Echo (7/12) showed moderate biatrial enlargement, EF 45-50% with severe inferoposterior hypokinesis, normal RV size and systolic function, normal functioning bioprosthetic aortic valve.  2. Ventricular fibrillation arrest in setting of acute MI.  3. Aortic stenosis: Severe AS.  Patient had AV replacement with bioprosthetic aortic valve in 6/11.  4. Atrial fibrillation/flutter: Complicated by cardioembolic MI.  At time of aortic valve replacement in 6/11, was noted to have extensive left atrial thrombus.  Thrombus was removed and patient had Maze procedure and left atrial appendage ligation.  Atrial fibrillation recurred and patient had 2 cardioversions while on amiodarone, neither succeeded.   He is now off amiodarone and rate controlled with coumadin anticoagulation.  5. Gout 6. History of pneumonia.  7. Diverticulosis.  8. Right TKR (5/12)    Family History: No premature CAD  Social History: Lives in [place] with his wife.  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  Rare ETOH.  Tobacco Use - Former. pt no longer smokes cigars quite in April 24, 2010 Smoking Status:  quit   Current Outpatient Prescriptions  Medication Sig Dispense Refill  . acetaminophen (TYLENOL) 500 MG tablet Take 500 mg by mouth as needed. For knee pain.       Marland Kitchen lisinopril (PRINIVIL,ZESTRIL) 5 MG tablet Take 1 tablet (5 mg total) by mouth daily.  30 tablet  6  . metoprolol (TOPROL-XL) 50 MG 24 hr tablet Take 50 mg by mouth 2 (two) times daily.       Marland Kitchen warfarin (COUMADIN) 5 MG tablet Take As Directed per Anticoagulation Clinic       60 tablet  2    BP 110/78  Pulse 75  Ht 5\' 10"  (1.778 m)  Wt 84.823 kg (187 lb)  BMI 26.83 kg/m2 General:  Well developed, well nourished, in no acute distress. Neck:  Neck supple, no JVD. No masses, thyromegaly or abnormal cervical nodes. Lungs:  Clear bilaterally to auscultation and percussion. Heart:  Non-displaced PMI, chest non-tender; irregular rate and rhythm, S1, S2 without rubs or gallops. 1/6 early systolic murmur RUSB.  Carotid upstroke normal, no bruit. Pedals normal pulses. Trace ankle edema.  Abdomen:  Bowel sounds positive; abdomen soft and non-tender without masses, organomegaly, or hernias noted. No hepatosplenomegaly. Extremities:  No clubbing or cyanosis. Neurologic:  Alert and oriented x 3. Psych:  Normal affect.

## 2011-11-21 NOTE — Assessment & Plan Note (Signed)
Patient actually did not have coronary plaque and it was thought that his MI was embolic from left atrial thrombus.  He is doing quite well now, NYHA class I symptoms.  EF 45-50% on echo with inferoposterior severe hypokinesis (prior inferoposterior MI).  Will check lipids/LFTs today.

## 2011-11-22 ENCOUNTER — Telehealth: Payer: Self-pay | Admitting: Cardiology

## 2011-11-22 NOTE — Telephone Encounter (Signed)
New msg Patient was calling about lab results 

## 2011-11-22 NOTE — Telephone Encounter (Signed)
F/U  Patient returning call from Nurse AL.

## 2011-11-22 NOTE — Telephone Encounter (Signed)
Discussed lab results 1/11/3 with pt

## 2011-11-22 NOTE — Telephone Encounter (Signed)
LMTCB

## 2011-12-10 ENCOUNTER — Other Ambulatory Visit: Payer: Self-pay | Admitting: Cardiology

## 2011-12-10 ENCOUNTER — Other Ambulatory Visit: Payer: Self-pay | Admitting: *Deleted

## 2011-12-10 DIAGNOSIS — I4891 Unspecified atrial fibrillation: Secondary | ICD-10-CM

## 2011-12-10 MED ORDER — METOPROLOL SUCCINATE ER 50 MG PO TB24: 50.0000 mg | ORAL_TABLET | Freq: Two times a day (BID) | ORAL | Status: DC

## 2011-12-10 MED ORDER — LISINOPRIL 5 MG PO TABS: 5.0000 mg | ORAL_TABLET | Freq: Every day | ORAL | Status: DC

## 2011-12-10 MED ORDER — WARFARIN SODIUM 5 MG PO TABS: ORAL_TABLET | ORAL | Status: DC

## 2011-12-10 NOTE — Telephone Encounter (Signed)
New Msg: pt is out of medication as of tonight. Please call RX into [facility].

## 2011-12-10 NOTE — Telephone Encounter (Signed)
New  Refill  [facility]  [phone]  [facility] pharmacy tech called patient out pills.

## 2011-12-10 NOTE — Telephone Encounter (Signed)
Refill completed.

## 2011-12-16 ENCOUNTER — Ambulatory Visit (INDEPENDENT_AMBULATORY_CARE_PROVIDER_SITE_OTHER): Payer: Medicare Other | Admitting: *Deleted

## 2011-12-16 DIAGNOSIS — Z7901 Long term (current) use of anticoagulants: Secondary | ICD-10-CM

## 2011-12-16 DIAGNOSIS — I359 Nonrheumatic aortic valve disorder, unspecified: Secondary | ICD-10-CM

## 2011-12-16 DIAGNOSIS — I4891 Unspecified atrial fibrillation: Secondary | ICD-10-CM

## 2011-12-19 ENCOUNTER — Encounter: Payer: Medicare Other | Admitting: *Deleted

## 2011-12-30 ENCOUNTER — Ambulatory Visit (INDEPENDENT_AMBULATORY_CARE_PROVIDER_SITE_OTHER): Payer: Medicare Other | Admitting: *Deleted

## 2011-12-30 DIAGNOSIS — I4891 Unspecified atrial fibrillation: Secondary | ICD-10-CM

## 2011-12-30 DIAGNOSIS — I359 Nonrheumatic aortic valve disorder, unspecified: Secondary | ICD-10-CM

## 2011-12-30 DIAGNOSIS — Z7901 Long term (current) use of anticoagulants: Secondary | ICD-10-CM

## 2012-01-13 ENCOUNTER — Ambulatory Visit: Payer: Medicare Other | Admitting: *Deleted

## 2012-01-13 ENCOUNTER — Ambulatory Visit (INDEPENDENT_AMBULATORY_CARE_PROVIDER_SITE_OTHER): Payer: Medicare Other | Admitting: *Deleted

## 2012-01-13 DIAGNOSIS — Z7901 Long term (current) use of anticoagulants: Secondary | ICD-10-CM

## 2012-01-13 DIAGNOSIS — I359 Nonrheumatic aortic valve disorder, unspecified: Secondary | ICD-10-CM

## 2012-01-13 DIAGNOSIS — I4891 Unspecified atrial fibrillation: Secondary | ICD-10-CM

## 2012-01-22 ENCOUNTER — Encounter (INDEPENDENT_AMBULATORY_CARE_PROVIDER_SITE_OTHER): Payer: Medicare Other | Admitting: Ophthalmology

## 2012-01-22 DIAGNOSIS — H33309 Unspecified retinal break, unspecified eye: Secondary | ICD-10-CM

## 2012-01-22 DIAGNOSIS — H4310 Vitreous hemorrhage, unspecified eye: Secondary | ICD-10-CM

## 2012-01-22 DIAGNOSIS — H43819 Vitreous degeneration, unspecified eye: Secondary | ICD-10-CM

## 2012-01-22 DIAGNOSIS — I10 Essential (primary) hypertension: Secondary | ICD-10-CM

## 2012-01-22 DIAGNOSIS — H35039 Hypertensive retinopathy, unspecified eye: Secondary | ICD-10-CM

## 2012-01-22 DIAGNOSIS — H2510 Age-related nuclear cataract, unspecified eye: Secondary | ICD-10-CM

## 2012-01-27 ENCOUNTER — Telehealth: Payer: Self-pay | Admitting: Cardiology

## 2012-01-27 NOTE — Telephone Encounter (Signed)
Only symptom is a dry cough.  Pcp ruled out uri.  Pt is wondering if it is his lisinopril?  Please advise.

## 2012-01-27 NOTE — Telephone Encounter (Signed)
Ok per pt to leave message on the answering machine.

## 2012-01-27 NOTE — Telephone Encounter (Signed)
F/U  Patient was disconnected in error during call with D. Leffler, please return call.

## 2012-01-27 NOTE — Telephone Encounter (Signed)
Pt started  taking lisinopril again about 6-7 months ago, also taking metoprolol, started coughing about 1 month ago , did have congestion but had xray a few weeks ago and was clear but still coughing,  pls advise ok to leave message

## 2012-01-28 NOTE — Telephone Encounter (Signed)
N/A.  LMTC. 

## 2012-01-28 NOTE — Telephone Encounter (Signed)
If cough does not resolve in 7-10 days, stop lisinopril and start losartan 25 mg daily

## 2012-01-28 NOTE — Telephone Encounter (Signed)
Pt was notified.  

## 2012-01-31 ENCOUNTER — Ambulatory Visit (INDEPENDENT_AMBULATORY_CARE_PROVIDER_SITE_OTHER): Payer: Medicare Other | Admitting: Ophthalmology

## 2012-01-31 DIAGNOSIS — H33309 Unspecified retinal break, unspecified eye: Secondary | ICD-10-CM

## 2012-02-03 ENCOUNTER — Ambulatory Visit (INDEPENDENT_AMBULATORY_CARE_PROVIDER_SITE_OTHER): Payer: Medicare Other | Admitting: *Deleted

## 2012-02-03 DIAGNOSIS — I359 Nonrheumatic aortic valve disorder, unspecified: Secondary | ICD-10-CM

## 2012-02-03 DIAGNOSIS — I4891 Unspecified atrial fibrillation: Secondary | ICD-10-CM

## 2012-02-03 DIAGNOSIS — Z7901 Long term (current) use of anticoagulants: Secondary | ICD-10-CM

## 2012-03-02 ENCOUNTER — Ambulatory Visit (INDEPENDENT_AMBULATORY_CARE_PROVIDER_SITE_OTHER): Payer: Medicare Other | Admitting: *Deleted

## 2012-03-02 DIAGNOSIS — I359 Nonrheumatic aortic valve disorder, unspecified: Secondary | ICD-10-CM

## 2012-03-02 DIAGNOSIS — I4891 Unspecified atrial fibrillation: Secondary | ICD-10-CM

## 2012-03-02 DIAGNOSIS — Z7901 Long term (current) use of anticoagulants: Secondary | ICD-10-CM

## 2012-03-02 LAB — POCT INR: INR: 1.7

## 2012-03-04 NOTE — Telephone Encounter (Signed)
Close  

## 2012-03-09 ENCOUNTER — Ambulatory Visit (INDEPENDENT_AMBULATORY_CARE_PROVIDER_SITE_OTHER): Payer: Medicare Other | Admitting: *Deleted

## 2012-03-09 DIAGNOSIS — I359 Nonrheumatic aortic valve disorder, unspecified: Secondary | ICD-10-CM

## 2012-03-09 DIAGNOSIS — Z7901 Long term (current) use of anticoagulants: Secondary | ICD-10-CM

## 2012-03-09 DIAGNOSIS — I4891 Unspecified atrial fibrillation: Secondary | ICD-10-CM

## 2012-03-23 ENCOUNTER — Ambulatory Visit (INDEPENDENT_AMBULATORY_CARE_PROVIDER_SITE_OTHER): Payer: Medicare Other | Admitting: *Deleted

## 2012-03-23 DIAGNOSIS — Z7901 Long term (current) use of anticoagulants: Secondary | ICD-10-CM

## 2012-03-23 DIAGNOSIS — I4891 Unspecified atrial fibrillation: Secondary | ICD-10-CM

## 2012-03-23 DIAGNOSIS — I359 Nonrheumatic aortic valve disorder, unspecified: Secondary | ICD-10-CM

## 2012-03-23 MED ORDER — WARFARIN SODIUM 5 MG PO TABS: ORAL_TABLET | ORAL | Status: DC

## 2012-04-09 ENCOUNTER — Ambulatory Visit (INDEPENDENT_AMBULATORY_CARE_PROVIDER_SITE_OTHER): Payer: Medicare Other | Admitting: *Deleted

## 2012-04-09 DIAGNOSIS — I4891 Unspecified atrial fibrillation: Secondary | ICD-10-CM

## 2012-04-09 DIAGNOSIS — I359 Nonrheumatic aortic valve disorder, unspecified: Secondary | ICD-10-CM

## 2012-04-09 DIAGNOSIS — Z7901 Long term (current) use of anticoagulants: Secondary | ICD-10-CM

## 2012-04-30 ENCOUNTER — Ambulatory Visit (INDEPENDENT_AMBULATORY_CARE_PROVIDER_SITE_OTHER): Payer: Medicare Other | Admitting: *Deleted

## 2012-04-30 DIAGNOSIS — Z7901 Long term (current) use of anticoagulants: Secondary | ICD-10-CM

## 2012-04-30 DIAGNOSIS — I4891 Unspecified atrial fibrillation: Secondary | ICD-10-CM

## 2012-04-30 DIAGNOSIS — I359 Nonrheumatic aortic valve disorder, unspecified: Secondary | ICD-10-CM

## 2012-04-30 LAB — POCT INR: INR: 2.2

## 2012-05-13 ENCOUNTER — Ambulatory Visit (INDEPENDENT_AMBULATORY_CARE_PROVIDER_SITE_OTHER): Payer: Medicare Other | Admitting: Cardiology

## 2012-05-13 ENCOUNTER — Encounter: Payer: Self-pay | Admitting: Cardiology

## 2012-05-13 VITALS — BP 114/74 | HR 73 | Ht 69.0 in | Wt 184.0 lb

## 2012-05-13 DIAGNOSIS — I4891 Unspecified atrial fibrillation: Secondary | ICD-10-CM

## 2012-05-13 DIAGNOSIS — Z954 Presence of other heart-valve replacement: Secondary | ICD-10-CM

## 2012-05-13 DIAGNOSIS — I359 Nonrheumatic aortic valve disorder, unspecified: Secondary | ICD-10-CM

## 2012-05-13 DIAGNOSIS — Z952 Presence of prosthetic heart valve: Secondary | ICD-10-CM

## 2012-05-13 DIAGNOSIS — I2510 Atherosclerotic heart disease of native coronary artery without angina pectoris: Secondary | ICD-10-CM

## 2012-05-13 NOTE — Patient Instructions (Addendum)
Your physician has requested that you have an echocardiogram. Echocardiography is a painless test that uses sound waves to create images of your heart. It provides your doctor with information about the size and shape of your heart and how well your heart's chambers and valves are working. This procedure takes approximately one hour. There are no restrictions for this procedure.   Your physician wants you to follow-up in: 6 months with fasting lab work You will receive a reminder letter in the mail two months in advance. If you don't receive a letter, please call our office to schedule the follow-up appointment.   Contact Vashti Hey RN in [place] office about setting up a Lovenox Bridge before surgery

## 2012-05-14 NOTE — Assessment & Plan Note (Signed)
Patient actually did not have coronary plaque and it was thought that his MI was embolic from left atrial thrombus.  He is doing quite well now, NYHA class I symptoms.  Echo to reassess EF.

## 2012-05-14 NOTE — Assessment & Plan Note (Signed)
Bioprosthetic valve.  With upcoming surgery, will get echo to reassess valve.

## 2012-05-14 NOTE — Assessment & Plan Note (Signed)
Patient is in atrial fibrillation today with good rate control.  He is on coumadin.  He has had both atrial fibrillation and flutter in the past.  He has had a Maze procedure as well as attempted cardioversion on amiodarone, but has not been converted back to NSR.  Plan rate control and anticoagulation strategy.  He had a presumed cardioembolic MI in the setting of a large LA appendage thrombus.  The LA appendage was ligated.  Given the embolic event, I think that he needs to be bridged with Lovenox while off coumadin even though the appendage was ligated.  He will likely need a left TKR in the future and will need to be bridged with Lovenox off coumadin.  He should be acceptable risk to undergo surgery as long as anticoagulation is maintained.

## 2012-05-14 NOTE — Progress Notes (Signed)
Patient ID: [name], male   DOB: 14-Jan-1946, 66 y.o.   MRN: [id number] PCP: Dr. Megan Mans [name])  66 yo with history of atrial fibrillation, cardioembolic MI, and aortic stenosis s/p aortic valve replacement presents for cardiology followup.  He had his right knee replacement in 5/12 with no complications and now needs left TKR.  No chest pain, no exertional dyspnea.  He remains in atrial fibrillation.  Besides his left knee pain, he has good exercise tolerance.  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  He notices that he fatigues but attributes it to walking with right knee pain.    ECG: atrial fibrillation, low voltage  Labs (1/12): K 4.7, creatinine 1.1, HDL 71, LDL 72 Labs (5/12): K 4.3, creatinine 0.81 Labs (7/12): LDL 47, HDL 55 Labs (8/12): K 4.5, creatinine 0.78 Labs (1/13): LDL 66, HDL 55, K 4.6, creatinine 1.1  Allergies (verified):  1)  ! * Diclofenac  Past Medical History: 1. Myocardial infarction: Patient had an inferoposterior MI in 6/11 complicated by ventricular fibrillation arrest.  LHC showed occlusion of OM1 and and left-sided PDA.  Other vessels showed no angiographic CAD.  This was thought to be embolic and was treated with heparin gtt.  He was actually found soon thereafter to have extensive left atrial thrombus. TEE (8/11) showed EF 50%, well-seated bioprosthetic aortic valve.  Echo (7/12) showed moderate biatrial enlargement, EF 45-50% with severe inferoposterior hypokinesis, normal RV size and systolic function, normal functioning bioprosthetic aortic valve.  2. Ventricular fibrillation arrest in setting of acute MI.  3. Aortic stenosis: Severe AS.  Patient had AV replacement with bioprosthetic aortic valve in 6/11.  4. Atrial fibrillation/flutter: Complicated by cardioembolic MI.  At time of aortic valve replacement in 6/11, was noted to have extensive left atrial thrombus.  Thrombus was removed and patient had Maze procedure  and left atrial appendage ligation.  Atrial fibrillation recurred and patient had 2 cardioversions while on amiodarone, neither succeeded.  He is now off amiodarone and rate controlled with coumadin anticoagulation.  5. Gout 6. History of pneumonia.  7. Diverticulosis.  8. OA: Right TKR (5/12)    Family History: No premature CAD  Social History: Lives in [place] with his wife.  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  Rare ETOH.  Tobacco Use - Former. pt no longer smokes cigars quite in April 24, 2010 Smoking Status:  Quit  ROS: All systems reviewed and negative except as per HPI.    Current Outpatient Prescriptions  Medication Sig Dispense Refill  . acetaminophen (TYLENOL) 500 MG tablet Take 500 mg by mouth as needed. For knee pain.       Marland Kitchen COLCRYS 0.6 MG tablet Take 1 tablet by mouth as needed.      Marland Kitchen lisinopril (PRINIVIL,ZESTRIL) 5 MG tablet Take 1 tablet (5 mg total) by mouth daily.  30 tablet  5  . metoprolol succinate (TOPROL-XL) 50 MG 24 hr tablet Take 1 tablet (50 mg total) by mouth 2 (two) times daily.  60 tablet  5  . warfarin (COUMADIN) 5 MG tablet Take As Directed per Anticoagulation Clinic  60 tablet  6    BP 114/74  Pulse 73  Ht 5\' 9"  (1.753 m)  Wt 83.462 kg (184 lb)  BMI 27.17 kg/m2 General:  Well developed, well nourished, in no acute distress. Neck:  Neck supple, no JVD. No masses, thyromegaly or abnormal cervical nodes. Lungs:  Clear bilaterally to auscultation and percussion.  Heart:  Non-displaced PMI, chest non-tender; irregular rate and rhythm, S1, S2 without rubs or gallops. 1/6 early systolic murmur RUSB.  Carotid upstroke normal, no bruit. Pedals normal pulses. Trace ankle edema.  Abdomen:  Bowel sounds positive; abdomen soft and non-tender without masses, organomegaly, or hernias noted. No hepatosplenomegaly. Extremities:  No clubbing or cyanosis. Neurologic:  Alert and oriented x 3. Psych:  Normal affect.

## 2012-05-22 ENCOUNTER — Ambulatory Visit (HOSPITAL_COMMUNITY): Payer: Medicare Other | Attending: Cardiovascular Disease | Admitting: Radiology

## 2012-05-22 DIAGNOSIS — Z954 Presence of other heart-valve replacement: Secondary | ICD-10-CM | POA: Insufficient documentation

## 2012-05-22 DIAGNOSIS — R011 Cardiac murmur, unspecified: Secondary | ICD-10-CM | POA: Insufficient documentation

## 2012-05-22 DIAGNOSIS — I517 Cardiomegaly: Secondary | ICD-10-CM | POA: Insufficient documentation

## 2012-05-22 DIAGNOSIS — Z952 Presence of prosthetic heart valve: Secondary | ICD-10-CM

## 2012-05-22 DIAGNOSIS — I2510 Atherosclerotic heart disease of native coronary artery without angina pectoris: Secondary | ICD-10-CM | POA: Insufficient documentation

## 2012-05-22 DIAGNOSIS — I359 Nonrheumatic aortic valve disorder, unspecified: Secondary | ICD-10-CM | POA: Insufficient documentation

## 2012-05-22 DIAGNOSIS — I4901 Ventricular fibrillation: Secondary | ICD-10-CM | POA: Insufficient documentation

## 2012-05-22 DIAGNOSIS — Z87891 Personal history of nicotine dependence: Secondary | ICD-10-CM | POA: Insufficient documentation

## 2012-05-22 NOTE — Progress Notes (Signed)
Echocardiogram performed.  

## 2012-05-25 ENCOUNTER — Telehealth: Payer: Self-pay | Admitting: Cardiology

## 2012-05-25 NOTE — Telephone Encounter (Signed)
Fu call °Pt returning your call  °

## 2012-05-25 NOTE — Telephone Encounter (Signed)
Spoke with pt about recent echo results. 

## 2012-05-25 NOTE — Telephone Encounter (Signed)
LMTCB

## 2012-05-25 NOTE — Telephone Encounter (Signed)
F/u  Returning call back to nurse.  

## 2012-05-25 NOTE — Telephone Encounter (Signed)
Busy/?phone out of order 

## 2012-05-25 NOTE — Telephone Encounter (Signed)
Busy/?phones out of order

## 2012-05-25 NOTE — Telephone Encounter (Signed)
Pt would like results of echo °

## 2012-05-28 ENCOUNTER — Ambulatory Visit (INDEPENDENT_AMBULATORY_CARE_PROVIDER_SITE_OTHER): Payer: Medicare Other | Admitting: *Deleted

## 2012-05-28 DIAGNOSIS — I359 Nonrheumatic aortic valve disorder, unspecified: Secondary | ICD-10-CM

## 2012-05-28 DIAGNOSIS — Z7901 Long term (current) use of anticoagulants: Secondary | ICD-10-CM

## 2012-05-28 DIAGNOSIS — I4891 Unspecified atrial fibrillation: Secondary | ICD-10-CM

## 2012-05-28 LAB — POCT INR: INR: 2.7

## 2012-06-08 ENCOUNTER — Other Ambulatory Visit: Payer: Self-pay | Admitting: *Deleted

## 2012-06-08 ENCOUNTER — Ambulatory Visit (INDEPENDENT_AMBULATORY_CARE_PROVIDER_SITE_OTHER): Payer: Medicare Other | Admitting: Ophthalmology

## 2012-06-08 DIAGNOSIS — H33309 Unspecified retinal break, unspecified eye: Secondary | ICD-10-CM

## 2012-06-08 DIAGNOSIS — I10 Essential (primary) hypertension: Secondary | ICD-10-CM

## 2012-06-08 DIAGNOSIS — H43819 Vitreous degeneration, unspecified eye: Secondary | ICD-10-CM

## 2012-06-08 DIAGNOSIS — H35039 Hypertensive retinopathy, unspecified eye: Secondary | ICD-10-CM

## 2012-06-08 MED ORDER — METOPROLOL SUCCINATE ER 50 MG PO TB24: 50.0000 mg | ORAL_TABLET | Freq: Two times a day (BID) | ORAL | Status: DC

## 2012-06-25 ENCOUNTER — Ambulatory Visit (INDEPENDENT_AMBULATORY_CARE_PROVIDER_SITE_OTHER): Payer: Medicare Other | Admitting: *Deleted

## 2012-06-25 DIAGNOSIS — I4891 Unspecified atrial fibrillation: Secondary | ICD-10-CM

## 2012-06-25 DIAGNOSIS — I359 Nonrheumatic aortic valve disorder, unspecified: Secondary | ICD-10-CM

## 2012-06-25 DIAGNOSIS — Z7901 Long term (current) use of anticoagulants: Secondary | ICD-10-CM

## 2012-06-25 LAB — POCT INR: INR: 3.7

## 2012-07-06 ENCOUNTER — Other Ambulatory Visit: Payer: Self-pay | Admitting: *Deleted

## 2012-07-06 MED ORDER — LISINOPRIL 5 MG PO TABS: 5.0000 mg | ORAL_TABLET | Freq: Every day | ORAL | Status: DC

## 2012-07-07 ENCOUNTER — Encounter: Payer: Self-pay | Admitting: Physician Assistant

## 2012-07-07 ENCOUNTER — Other Ambulatory Visit: Payer: Self-pay | Admitting: Physician Assistant

## 2012-07-07 DIAGNOSIS — M1712 Unilateral primary osteoarthritis, left knee: Secondary | ICD-10-CM

## 2012-07-07 NOTE — H&P (Signed)
[name] is an 66 y.o. male.   Chief Complaint: left knee DJD HPI: [name] is a 66 year old seen for evaluation for increasing left knee pain. He has pain with weightbearing and activity relieved by rest. He had a right total knee replacement on 04/01/11 and this is doing well but his left knee is bothering him progressively. Pain with every step and he intermittently needs an aide to ambulate and has trouble getting out of a chair without pain. This is worse with activity relieved by rest. He has history of gout. Past medical history: significant for aortic valve replacement in 2011, atrial fibrillation history of MI with a vfib rhythm. He's under the care of Dr. Shirlee Latch from cardiology and Dr. Renard Matter is his primary care physician.  The excruciating pain is getting progressively worse, limiting activities of daily living, poses a significant fall risk, and has failed multiple conservative treatments including intraarticular cortisone injections, intraarticular Supartz, bracing, medication and home physical therapy.  Past Medical History  Diagnosis Date  . Myocardial infarction   . Ventricular fibrillation   . Aortic stenosis   . Atrial fibrillation or flutter   . Gout   . History of pneumonia   . Diverticulosis   . CAD (coronary artery disease)   . Left knee DJD     Past Surgical History  Procedure Date  . Right ankle pinning     Fell from tree w/fracture several years ago.  . 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 Social History:  reports that he quit smoking about 2 years ago. His smoking use included Cigars. He does not have any smokeless tobacco history on file. He reports that he drinks alcohol. He reports that he does not use illicit drugs.  Allergies:  Allergies  Allergen Reactions  . Diclofenac Itching  . Naproxen     itching   Current Outpatient Prescriptions  on File Prior to Visit  Medication Sig Dispense Refill  . acetaminophen (TYLENOL) 500 MG tablet Take 500 mg by mouth as needed. For knee pain.       Marland Kitchen COLCRYS 0.6 MG tablet Take 1 tablet by mouth as needed.      Marland Kitchen lisinopril (PRINIVIL,ZESTRIL) 5 MG tablet Take 1 tablet (5 mg total) by mouth daily.  30 tablet  5  . metoprolol succinate (TOPROL-XL) 50 MG 24 hr tablet Take 1 tablet (50 mg total) by mouth 2 (two) times daily.  60 tablet  5  . warfarin (COUMADIN) 5 MG tablet Take As Directed per Anticoagulation Clinic  60 tablet  6    (Not in a hospital admission)  No results found for this or any previous visit (from the past 48 hour(s)). No results found.  Review of Systems  Constitutional: Negative.   HENT: Negative.   Eyes: Positive for blurred vision.  Respiratory: Negative.   Cardiovascular: Negative.   Gastrointestinal: Negative.   Genitourinary: Negative.   Musculoskeletal:       Left knee pain  Skin: Negative.   Neurological: Negative.   Endo/Heme/Allergies: Negative.     Blood pressure 120/73, pulse 68, temperature 98.9 F (37.2 C), height 5\' 9"  (1.753 m), weight 83.462 kg (184 lb), SpO2 97.00%. Physical Exam  Constitutional: He is oriented to person, place, and time. He appears well-developed and well-nourished.  Cardiovascular:       Irregular rate and  rhythm  Respiratory: Effort  normal.  GI: Soft.  Genitourinary:       Not pertinent to current symptomatology therefore not examined.  Musculoskeletal:        Examination of his left knee reveals pain medially and laterally mild varus deformity 1+ crepitation 1+ synovitis, range of motion -5 to 120 degrees knee is stable with normal patella tracking. Exam of the right knee reveals well healed total knee incision without swelling or pain, full range of motion knee is stable with normal patella tracking. Vascular exam: pulses 2+ and symmetric.  Neurological: He is alert and oriented to person, place, and time.  Skin: Skin  is warm and dry.  Psychiatric: He has a normal mood and affect. His behavior is normal. Thought content normal.    X-RAYS: Left knee standing AP lateral flexion and sunrise views show end stage degenerative joint disease on the left with bone on bone arthritis and varus deformity. He has excellent position of his right total knee prosthesis.  Assessment Patient Active Problem List  Diagnosis  . CAD, NATIVE VESSEL  . AORTIC VALVE DISORDERS  . ATRIAL FIBRILLATION  . TACHYCARDIA  . Osteoarthritis  . Encounter for long-term (current) use of anticoagulants  . Left knee DJD    Plan I talk to him about this in detail. He has been cleared for left total knee replacement by Dr. Shirlee Latch and Dr. Renard Matter. I do think that he's a satisfactory candidate for left total knee replacement due to failed conservative care. Discussed risks benefits and possible complications of the surgery in detail and he understands this completely.   Carime Dinkel J 07/07/2012, 3:27 PM

## 2012-07-08 ENCOUNTER — Encounter (HOSPITAL_COMMUNITY): Payer: Self-pay | Admitting: Pharmacy Technician

## 2012-07-09 ENCOUNTER — Ambulatory Visit (INDEPENDENT_AMBULATORY_CARE_PROVIDER_SITE_OTHER): Payer: Medicare Other | Admitting: *Deleted

## 2012-07-09 DIAGNOSIS — I359 Nonrheumatic aortic valve disorder, unspecified: Secondary | ICD-10-CM

## 2012-07-09 DIAGNOSIS — Z7901 Long term (current) use of anticoagulants: Secondary | ICD-10-CM

## 2012-07-09 DIAGNOSIS — I4891 Unspecified atrial fibrillation: Secondary | ICD-10-CM

## 2012-07-09 LAB — POCT INR: INR: 3.7

## 2012-07-09 NOTE — Patient Instructions (Addendum)
Patient to call [name] 9/3 after pre op visit and receive lovenox bridging instructions

## 2012-07-14 ENCOUNTER — Telehealth: Payer: Self-pay | Admitting: Pharmacist

## 2012-07-14 ENCOUNTER — Encounter (HOSPITAL_COMMUNITY): Payer: Self-pay

## 2012-07-14 ENCOUNTER — Encounter (HOSPITAL_COMMUNITY)
Admission: RE | Admit: 2012-07-14 | Discharge: 2012-07-14 | Disposition: A | Discharge status: Home / Self Care | Payer: Medicare Other | Source: Ambulatory Visit | Attending: Physician Assistant | Admitting: Physician Assistant

## 2012-07-14 ENCOUNTER — Encounter (HOSPITAL_COMMUNITY)
Admission: RE | Admit: 2012-07-14 | Discharge: 2012-07-14 | Disposition: A | Discharge status: Home / Self Care | Payer: Medicare Other | Source: Ambulatory Visit | Attending: Orthopedic Surgery | Admitting: Orthopedic Surgery

## 2012-07-14 HISTORY — DX: Chronic obstructive pulmonary disease, unspecified: J44.9

## 2012-07-14 LAB — COMPREHENSIVE METABOLIC PANEL
Alkaline Phosphatase: 70 U/L (ref 39–117)
BUN: 27 mg/dL — ABNORMAL HIGH (ref 6–23)
Chloride: 99 mEq/L (ref 96–112)
GFR calc Af Amer: 81 mL/min — ABNORMAL LOW (ref >90)
Glucose, Bld: 88 mg/dL (ref 70–99)
Potassium: 4.5 mEq/L (ref 3.5–5.1)
Total Bilirubin: 0.5 mg/dL (ref 0.3–1.2)

## 2012-07-14 LAB — URINALYSIS, ROUTINE W REFLEX MICROSCOPIC
Bilirubin Urine: NEGATIVE
Hgb urine dipstick: NEGATIVE
Nitrite: NEGATIVE
Specific Gravity, Urine: 1.009 (ref 1.005–1.030)
pH: 6.5 (ref 5.0–8.0)

## 2012-07-14 LAB — CBC
HCT: 40.8 % (ref 39.0–52.0)
MCH: 33.5 pg (ref 26.0–34.0)
MCHC: 33.8 g/dL (ref 30.0–36.0)
RDW: 13 % (ref 11.5–15.5)

## 2012-07-14 LAB — PROTIME-INR: Prothrombin Time: 27.8 seconds — ABNORMAL HIGH (ref 11.6–15.2)

## 2012-07-14 LAB — SURGICAL PCR SCREEN: Staphylococcus aureus: NEGATIVE

## 2012-07-14 NOTE — Pre-Procedure Instructions (Signed)
20 [name]  07/14/2012   Your procedure is scheduled on: Monday 07/20/12   Report to [facility] at 830 AM.  Call this number if you have problems the morning of surgery: [phone]   Remember:   Do not eat food OR DRINK :After Midnight    Take these medicines the morning of surgery with A SIP OF WATER: TYLENOL IF NEEDED, TOPROL(METOPROLOL)   Do not wear jewelry, make-up or nail polish.  Do not wear lotions, powders, or perfumes. You may wear deodorant.  Do not shave 48 hours prior to surgery. Men may shave face and neck.  Do not bring valuables to the hospital.  Contacts, dentures or bridgework may not be worn into surgery.  Leave suitcase in the car. After surgery it may be brought to your room.  For patients admitted to the hospital, checkout time is 11:00 AM the day of discharge.   Patients discharged the day of surgery will not be allowed to drive home.  Name and phone number of your driver:   Special Instructions: CHG Shower Use Special Wash: 1/2 bottle night before surgery and 1/2 bottle morning of surgery.   Please read over the following fact sheets that you were given: Pain Booklet, Coughing and Deep Breathing, Blood Transfusion Information, Total Joint Packet, MRSA Information and Surgical Site Infection Prevention

## 2012-07-14 NOTE — Telephone Encounter (Signed)
Spoke with pt.  He is going to go to the [place] office tomorrow for Lovenox bridge instructions.  Pt's CrCl- 41mL/min.  Okay to dose at 80mg  BID or 120mg  once daily.

## 2012-07-14 NOTE — Telephone Encounter (Signed)
Message copied by Velda Shell on Tue Jul 14, 2012 11:33 AM ------      Message from: Laurey Morale      Created: Tue Jul 14, 2012  8:34 AM      Regarding: RE: clearance for surgery       [name] should have Lovenox bridging when off coumadin given history of cardioembolic MI.             ----- Message -----         From: Carmela Hurt, RN         Sent: 07/09/2012  10:09 AM           To: Laurey Morale, MD, Louanna Raw, RN, #      Subject: clearance for surgery                                    Patient is having a TKR by Dr Thurston Hole 07/20/2012, he has received lovenox bridging in the past, please advise on bridging for this upcoming procedure. Thanks, Deeann Cree, patient states he is expecting a call from you the day of his pre-op which is the 07/14/2012

## 2012-07-15 ENCOUNTER — Ambulatory Visit (INDEPENDENT_AMBULATORY_CARE_PROVIDER_SITE_OTHER): Payer: Medicare Other | Admitting: *Deleted

## 2012-07-15 ENCOUNTER — Other Ambulatory Visit (HOSPITAL_COMMUNITY): Payer: Self-pay | Admitting: Family Medicine

## 2012-07-15 ENCOUNTER — Ambulatory Visit (HOSPITAL_COMMUNITY)
Admission: RE | Admit: 2012-07-15 | Discharge: 2012-07-15 | Disposition: A | Discharge status: Home / Self Care | Payer: Medicare Other | Source: Ambulatory Visit | Attending: Family Medicine | Admitting: Family Medicine

## 2012-07-15 DIAGNOSIS — I4891 Unspecified atrial fibrillation: Secondary | ICD-10-CM

## 2012-07-15 DIAGNOSIS — I359 Nonrheumatic aortic valve disorder, unspecified: Secondary | ICD-10-CM

## 2012-07-15 DIAGNOSIS — Z7901 Long term (current) use of anticoagulants: Secondary | ICD-10-CM

## 2012-07-15 DIAGNOSIS — M542 Cervicalgia: Secondary | ICD-10-CM | POA: Insufficient documentation

## 2012-07-15 DIAGNOSIS — R52 Pain, unspecified: Secondary | ICD-10-CM

## 2012-07-15 DIAGNOSIS — M5030 Other cervical disc degeneration, unspecified cervical region: Secondary | ICD-10-CM | POA: Insufficient documentation

## 2012-07-15 LAB — URINE CULTURE: Culture: NO GROWTH

## 2012-07-15 MED ORDER — ENOXAPARIN SODIUM 120 MG/0.8ML ~~LOC~~ SOLN: 120.0000 mg | Freq: Every day | SUBCUTANEOUS | Status: DC

## 2012-07-15 NOTE — Consult Note (Signed)
Anesthesia chart review: Patient is a 66 year old male scheduled for left total knee replacement on 07/20/2012 by Dr. Thurston Hole.  He is s/p right TKR on 04/01/11.  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  Echo showed extensive LA thrombus and severe AS.  MI was thought to be embolic and was treated with heparin gtt.  He underwent AVR (bioprosthetic) on 05/01/10.  Other history includes afib s/p unsuccessful cardioversion, former smoker, gout, COPD, PNA, diverticulosis.  PCP is Dr. Renard Matter.  Cardiologist is Dr. Shirlee Latch.  According to 05/13/12, patient is felt to be "acceptable risk to undergo surgery as long as anticoagulation is maintained."  EKG then showed afib @ 73 bpm, low voltage QRS.  Echo on 05/22/12 showed: - Left ventricle: The cavity size was normal. Wall thickness was normal. Systolic function was normal. The estimated ejection fraction was in the range of 50% to 55%. - Aortic valve: A bioprosthesis was present. Mean gradient: 8mm Hg (S). Peak gradient: 14mm Hg (S). - Left atrium: The atrium was moderately dilated. - Right atrium: The atrium was mildly dilated.  His last cardiac cath was on in June 2011, prior to his AVR.  CXR on 07/14/12 showed no acute cardiopulmonary findings.  Labs note.  Repeat PT/PTT on the day of surgery.  Patient will ultimately be placed on a Lovenox bridge while his Coumadin is on hold.   Shonna Chock, PA-C

## 2012-07-15 NOTE — Patient Instructions (Signed)
9/4   No lovenox or coumadin 9/5   Lovenox 120mg  sq 8am 9/6   Lovenox 120mg  sq 8am 9/7   Lovenox 120mg  sq 8am 9/8   Lovenox 120mg  sq 8am 9/9   No lovenox  SURGERY Lt TKR D/C home with [place]

## 2012-07-19 MED ORDER — CHLORHEXIDINE GLUCONATE 4 % EX LIQD: 60.0000 mL | Freq: Once | CUTANEOUS | Status: DC

## 2012-07-19 MED ORDER — POVIDONE-IODINE 7.5 % EX SOLN
Freq: Once | CUTANEOUS | Status: DC
  Filled 2012-07-19: qty 118

## 2012-07-19 MED ORDER — LACTATED RINGERS IV SOLN
INTRAVENOUS | Status: DC
  Administered 2012-07-20 (×2): via INTRAVENOUS

## 2012-07-19 MED ORDER — CEFAZOLIN SODIUM-DEXTROSE 2-3 GM-% IV SOLR
2.0000 g | INTRAVENOUS | Status: AC
  Administered 2012-07-20: 2 g via INTRAVENOUS
  Filled 2012-07-19: qty 50

## 2012-07-20 ENCOUNTER — Encounter (HOSPITAL_COMMUNITY): Payer: Self-pay | Admitting: *Deleted

## 2012-07-20 ENCOUNTER — Inpatient Hospital Stay (HOSPITAL_COMMUNITY)
Admission: RE | Admit: 2012-07-20 | Discharge: 2012-07-28 | DRG: 470 | Disposition: A | Discharge status: Home Health | Payer: Medicare Other | Source: Ambulatory Visit | Attending: Orthopedic Surgery | Admitting: Orthopedic Surgery

## 2012-07-20 ENCOUNTER — Encounter (HOSPITAL_COMMUNITY)
Admission: RE | Disposition: A | Discharge status: Home Health | Payer: Self-pay | Source: Ambulatory Visit | Attending: Orthopedic Surgery

## 2012-07-20 ENCOUNTER — Encounter (HOSPITAL_COMMUNITY): Payer: Self-pay | Admitting: Vascular Surgery

## 2012-07-20 ENCOUNTER — Inpatient Hospital Stay (HOSPITAL_COMMUNITY): Payer: Medicare Other | Admitting: Vascular Surgery

## 2012-07-20 DIAGNOSIS — IMO0002 Reserved for concepts with insufficient information to code with codable children: Principal | ICD-10-CM | POA: Diagnosis present

## 2012-07-20 DIAGNOSIS — K9189 Other postprocedural complications and disorders of digestive system: Secondary | ICD-10-CM

## 2012-07-20 DIAGNOSIS — M1712 Unilateral primary osteoarthritis, left knee: Secondary | ICD-10-CM | POA: Diagnosis present

## 2012-07-20 DIAGNOSIS — I359 Nonrheumatic aortic valve disorder, unspecified: Secondary | ICD-10-CM | POA: Diagnosis present

## 2012-07-20 DIAGNOSIS — Y849 Medical procedure, unspecified as the cause of abnormal reaction of the patient, or of later complication, without mention of misadventure at the time of the procedure: Secondary | ICD-10-CM | POA: Diagnosis not present

## 2012-07-20 DIAGNOSIS — E871 Hypo-osmolality and hyponatremia: Secondary | ICD-10-CM | POA: Diagnosis not present

## 2012-07-20 DIAGNOSIS — Z87891 Personal history of nicotine dependence: Secondary | ICD-10-CM

## 2012-07-20 DIAGNOSIS — M1710 Unilateral primary osteoarthritis, unspecified knee: Principal | ICD-10-CM | POA: Diagnosis present

## 2012-07-20 DIAGNOSIS — D72829 Elevated white blood cell count, unspecified: Secondary | ICD-10-CM | POA: Diagnosis not present

## 2012-07-20 DIAGNOSIS — Z7901 Long term (current) use of anticoagulants: Secondary | ICD-10-CM

## 2012-07-20 DIAGNOSIS — K929 Disease of digestive system, unspecified: Secondary | ICD-10-CM | POA: Diagnosis not present

## 2012-07-20 DIAGNOSIS — J449 Chronic obstructive pulmonary disease, unspecified: Secondary | ICD-10-CM | POA: Diagnosis present

## 2012-07-20 DIAGNOSIS — K560 Paralytic ileus: Secondary | ICD-10-CM | POA: Diagnosis not present

## 2012-07-20 DIAGNOSIS — Z954 Presence of other heart-valve replacement: Secondary | ICD-10-CM

## 2012-07-20 DIAGNOSIS — M1990 Unspecified osteoarthritis, unspecified site: Secondary | ICD-10-CM | POA: Diagnosis present

## 2012-07-20 DIAGNOSIS — E876 Hypokalemia: Secondary | ICD-10-CM | POA: Diagnosis not present

## 2012-07-20 DIAGNOSIS — J4489 Other specified chronic obstructive pulmonary disease: Secondary | ICD-10-CM | POA: Diagnosis present

## 2012-07-20 DIAGNOSIS — K567 Ileus, unspecified: Secondary | ICD-10-CM | POA: Diagnosis not present

## 2012-07-20 DIAGNOSIS — D649 Anemia, unspecified: Secondary | ICD-10-CM | POA: Diagnosis present

## 2012-07-20 DIAGNOSIS — J069 Acute upper respiratory infection, unspecified: Secondary | ICD-10-CM | POA: Diagnosis not present

## 2012-07-20 DIAGNOSIS — I252 Old myocardial infarction: Secondary | ICD-10-CM

## 2012-07-20 DIAGNOSIS — I2510 Atherosclerotic heart disease of native coronary artery without angina pectoris: Secondary | ICD-10-CM | POA: Diagnosis present

## 2012-07-20 DIAGNOSIS — I4891 Unspecified atrial fibrillation: Secondary | ICD-10-CM | POA: Diagnosis present

## 2012-07-20 DIAGNOSIS — Z96659 Presence of unspecified artificial knee joint: Secondary | ICD-10-CM

## 2012-07-20 HISTORY — PX: TOTAL KNEE ARTHROPLASTY: SHX125

## 2012-07-20 LAB — PROTIME-INR: INR: 1.04 (ref 0.00–1.49)

## 2012-07-20 SURGERY — ARTHROPLASTY, KNEE, TOTAL
Anesthesia: Regional | Site: Knee | Laterality: Left | Wound class: Clean

## 2012-07-20 MED ORDER — METOPROLOL SUCCINATE ER 50 MG PO TB24
50.0000 mg | ORAL_TABLET | Freq: Two times a day (BID) | ORAL | Status: DC
  Administered 2012-07-20 – 2012-07-23 (×6): 50 mg via ORAL
  Filled 2012-07-20 (×7): qty 1

## 2012-07-20 MED ORDER — CEFUROXIME SODIUM 1.5 G IJ SOLR
INTRAMUSCULAR | Status: DC | PRN
  Administered 2012-07-20: 1.5 g

## 2012-07-20 MED ORDER — ENOXAPARIN SODIUM 30 MG/0.3ML ~~LOC~~ SOLN
30.0000 mg | Freq: Two times a day (BID) | SUBCUTANEOUS | Status: DC
  Administered 2012-07-22 – 2012-07-27 (×11): 30 mg via SUBCUTANEOUS
  Filled 2012-07-20 (×13): qty 0.3

## 2012-07-20 MED ORDER — BUPIVACAINE-EPINEPHRINE PF 0.25-1:200000 % IJ SOLN
INTRAMUSCULAR | Status: AC
  Filled 2012-07-20: qty 30

## 2012-07-20 MED ORDER — WARFARIN SODIUM 10 MG PO TABS
10.0000 mg | ORAL_TABLET | Freq: Every day | ORAL | Status: DC
  Administered 2012-07-20 – 2012-07-22 (×3): 10 mg via ORAL
  Filled 2012-07-20 (×6): qty 1

## 2012-07-20 MED ORDER — ONDANSETRON HCL 4 MG/2ML IJ SOLN
INTRAMUSCULAR | Status: DC | PRN
  Administered 2012-07-20: 4 mg via INTRAVENOUS

## 2012-07-20 MED ORDER — MIDAZOLAM HCL 2 MG/2ML IJ SOLN: 1.0000 mg | INTRAMUSCULAR | Status: DC | PRN

## 2012-07-20 MED ORDER — METOCLOPRAMIDE HCL 5 MG PO TABS
5.0000 mg | ORAL_TABLET | Freq: Three times a day (TID) | ORAL | Status: DC | PRN
  Administered 2012-07-23: 5 mg via ORAL
  Filled 2012-07-20 (×2): qty 2

## 2012-07-20 MED ORDER — OXYCODONE HCL 5 MG PO TABS
5.0000 mg | ORAL_TABLET | ORAL | Status: DC | PRN
  Administered 2012-07-20 (×2): 10 mg via ORAL
  Administered 2012-07-21: 5 mg via ORAL
  Administered 2012-07-21 (×2): 10 mg via ORAL
  Administered 2012-07-21: 5 mg via ORAL
  Administered 2012-07-21 (×3): 10 mg via ORAL
  Administered 2012-07-22: 5 mg via ORAL
  Administered 2012-07-22: 10 mg via ORAL
  Administered 2012-07-22 – 2012-07-23 (×5): 5 mg via ORAL
  Filled 2012-07-20 (×2): qty 2
  Filled 2012-07-20: qty 1
  Filled 2012-07-20: qty 2
  Filled 2012-07-20 (×2): qty 1
  Filled 2012-07-20 (×4): qty 2
  Filled 2012-07-20 (×2): qty 1
  Filled 2012-07-20 (×3): qty 2
  Filled 2012-07-20 (×3): qty 1

## 2012-07-20 MED ORDER — ONDANSETRON HCL 4 MG/2ML IJ SOLN: 4.0000 mg | Freq: Four times a day (QID) | INTRAMUSCULAR | Status: DC | PRN

## 2012-07-20 MED ORDER — ACETAMINOPHEN 325 MG PO TABS
650.0000 mg | ORAL_TABLET | Freq: Four times a day (QID) | ORAL | Status: DC | PRN
  Administered 2012-07-23 – 2012-07-28 (×8): 650 mg via ORAL
  Filled 2012-07-20 (×8): qty 2

## 2012-07-20 MED ORDER — MORPHINE SULFATE 10 MG/ML IJ SOLN
INTRAMUSCULAR | Status: DC | PRN
  Administered 2012-07-20: 4 mg via INTRAVENOUS
  Administered 2012-07-20 (×2): 3 mg via INTRAVENOUS

## 2012-07-20 MED ORDER — CEFUROXIME SODIUM 1.5 G IJ SOLR
INTRAMUSCULAR | Status: AC
  Filled 2012-07-20: qty 1.5

## 2012-07-20 MED ORDER — LISINOPRIL 5 MG PO TABS
5.0000 mg | ORAL_TABLET | Freq: Every day | ORAL | Status: DC
  Administered 2012-07-20 – 2012-07-28 (×4): 5 mg via ORAL
  Filled 2012-07-20 (×9): qty 1

## 2012-07-20 MED ORDER — CEFAZOLIN SODIUM-DEXTROSE 2-3 GM-% IV SOLR
2.0000 g | Freq: Four times a day (QID) | INTRAVENOUS | Status: AC
  Administered 2012-07-20 (×2): 2 g via INTRAVENOUS
  Filled 2012-07-20 (×2): qty 50

## 2012-07-20 MED ORDER — ONDANSETRON HCL 4 MG PO TABS: 4.0000 mg | ORAL_TABLET | Freq: Four times a day (QID) | ORAL | Status: DC | PRN

## 2012-07-20 MED ORDER — HYDROMORPHONE HCL PF 1 MG/ML IJ SOLN
0.5000 mg | INTRAMUSCULAR | Status: DC | PRN
Start: 2012-07-20 — End: 2012-07-23
  Administered 2012-07-20 – 2012-07-21 (×7): 0.5 mg via INTRAVENOUS
  Filled 2012-07-20 (×6): qty 1

## 2012-07-20 MED ORDER — FENTANYL CITRATE 0.05 MG/ML IJ SOLN: 50.0000 ug | INTRAMUSCULAR | Status: DC | PRN

## 2012-07-20 MED ORDER — SODIUM CHLORIDE 0.9 % IR SOLN
Status: DC | PRN
  Administered 2012-07-20: 3000 mL
  Administered 2012-07-20: 1000 mL

## 2012-07-20 MED ORDER — PHENOL 1.4 % MT LIQD
1.0000 | OROMUCOSAL | Status: DC | PRN
Start: 2012-07-20 — End: 2012-07-28
  Filled 2012-07-20: qty 177

## 2012-07-20 MED ORDER — DOCUSATE SODIUM 100 MG PO CAPS
100.0000 mg | ORAL_CAPSULE | Freq: Two times a day (BID) | ORAL | Status: DC
  Administered 2012-07-20 – 2012-07-28 (×9): 100 mg via ORAL
  Filled 2012-07-20 (×18): qty 1

## 2012-07-20 MED ORDER — POTASSIUM CHLORIDE IN NACL 20-0.9 MEQ/L-% IV SOLN
INTRAVENOUS | Status: DC
  Administered 2012-07-20 – 2012-07-21 (×2): via INTRAVENOUS
  Administered 2012-07-24: 1000 mL via INTRAVENOUS
  Filled 2012-07-20 (×13): qty 1000

## 2012-07-20 MED ORDER — SORBITOL 70 % SOLN
30.0000 mL | Freq: Two times a day (BID) | Status: DC
  Administered 2012-07-20 – 2012-07-23 (×6): 30 mL via ORAL
  Filled 2012-07-20 (×8): qty 30

## 2012-07-20 MED ORDER — HYDROMORPHONE HCL PF 1 MG/ML IJ SOLN
INTRAMUSCULAR | Status: AC
  Filled 2012-07-20: qty 1

## 2012-07-20 MED ORDER — WARFARIN - PHARMACIST DOSING INPATIENT: Freq: Every day | Status: DC

## 2012-07-20 MED ORDER — FENTANYL CITRATE 0.05 MG/ML IJ SOLN
INTRAMUSCULAR | Status: DC | PRN
  Administered 2012-07-20 (×2): 50 ug via INTRAVENOUS
  Administered 2012-07-20: 100 ug via INTRAVENOUS
  Administered 2012-07-20: 50 ug via INTRAVENOUS

## 2012-07-20 MED ORDER — METOCLOPRAMIDE HCL 5 MG/ML IJ SOLN
5.0000 mg | Freq: Three times a day (TID) | INTRAMUSCULAR | Status: DC | PRN
  Administered 2012-07-22: 10 mg via INTRAVENOUS
  Filled 2012-07-20 (×2): qty 2

## 2012-07-20 MED ORDER — MIDAZOLAM HCL 5 MG/5ML IJ SOLN
INTRAMUSCULAR | Status: DC | PRN
  Administered 2012-07-20: 2 mg via INTRAVENOUS

## 2012-07-20 MED ORDER — ACETAMINOPHEN 650 MG RE SUPP: 650.0000 mg | Freq: Four times a day (QID) | RECTAL | Status: DC | PRN

## 2012-07-20 MED ORDER — PROPOFOL 10 MG/ML IV EMUL
INTRAVENOUS | Status: DC | PRN
  Administered 2012-07-20: 150 mg via INTRAVENOUS

## 2012-07-20 MED ORDER — WARFARIN - PHYSICIAN DOSING INPATIENT: Freq: Every day | Status: DC

## 2012-07-20 MED ORDER — BUPIVACAINE-EPINEPHRINE PF 0.5-1:200000 % IJ SOLN
INTRAMUSCULAR | Status: DC | PRN
  Administered 2012-07-20: 30 mL

## 2012-07-20 MED ORDER — BUPIVACAINE-EPINEPHRINE 0.25% -1:200000 IJ SOLN
INTRAMUSCULAR | Status: DC | PRN
  Administered 2012-07-20: 30 mL

## 2012-07-20 MED ORDER — COLCHICINE 0.6 MG PO TABS
0.6000 mg | ORAL_TABLET | Freq: Every day | ORAL | Status: DC | PRN
Start: 2012-07-20 — End: 2012-07-28
  Administered 2012-07-28: 0.6 mg via ORAL
  Filled 2012-07-20 (×2): qty 1

## 2012-07-20 MED ORDER — MENTHOL 3 MG MT LOZG: 1.0000 | LOZENGE | OROMUCOSAL | Status: DC | PRN

## 2012-07-20 MED ORDER — LIDOCAINE HCL (CARDIAC) 20 MG/ML IV SOLN
INTRAVENOUS | Status: DC | PRN
  Administered 2012-07-20: 100 mg via INTRAVENOUS

## 2012-07-20 MED ORDER — ONDANSETRON HCL 4 MG/2ML IJ SOLN
4.0000 mg | Freq: Four times a day (QID) | INTRAMUSCULAR | Status: DC | PRN
  Administered 2012-07-24: 4 mg via INTRAVENOUS
  Filled 2012-07-20: qty 2

## 2012-07-20 MED ORDER — HYDROMORPHONE HCL PF 1 MG/ML IJ SOLN
0.2500 mg | INTRAMUSCULAR | Status: DC | PRN
  Administered 2012-07-20 (×4): 0.5 mg via INTRAVENOUS

## 2012-07-20 SURGICAL SUPPLY — 77 items
BANDAGE ESMARK 6X9 LF (GAUZE/BANDAGES/DRESSINGS) ×1 IMPLANT
BLADE SAGITTAL 25.0X1.19X90 (BLADE) ×2 IMPLANT
BLADE SAW SGTL 11.0X1.19X90.0M (BLADE) IMPLANT
BLADE SAW SGTL 13.0X1.19X90.0M (BLADE) ×2 IMPLANT
BLADE SURG 10 STRL SS (BLADE) ×4 IMPLANT
BNDG CMPR 9X6 STRL LF SNTH (GAUZE/BANDAGES/DRESSINGS) ×1
BNDG CMPR MED 15X6 ELC VLCR LF (GAUZE/BANDAGES/DRESSINGS)
BNDG ELASTIC 6X15 VLCR STRL LF (GAUZE/BANDAGES/DRESSINGS) IMPLANT
BNDG ESMARK 6X9 LF (GAUZE/BANDAGES/DRESSINGS) ×2
BOWL SMART MIX CTS (DISPOSABLE) ×2 IMPLANT
CEMENT HV SMART SET (Cement) ×4 IMPLANT
CLOTH BEACON ORANGE TIMEOUT ST (SAFETY) ×2 IMPLANT
CLSR STERI-STRIP ANTIMIC 1/2X4 (GAUZE/BANDAGES/DRESSINGS) ×2 IMPLANT
COVER BACK TABLE 24X17X13 BIG (DRAPES) IMPLANT
COVER PROBE W GEL 5X96 (DRAPES) ×2 IMPLANT
COVER SURGICAL LIGHT HANDLE (MISCELLANEOUS) ×2 IMPLANT
CUFF TOURNIQUET SINGLE 34IN LL (TOURNIQUET CUFF) ×2 IMPLANT
CUFF TOURNIQUET SINGLE 44IN (TOURNIQUET CUFF) IMPLANT
DRAPE EXTREMITY T 121X128X90 (DRAPE) ×2 IMPLANT
DRAPE INCISE IOBAN 66X45 STRL (DRAPES) ×2 IMPLANT
DRAPE PROXIMA HALF (DRAPES) ×2 IMPLANT
DRAPE U-SHAPE 47X51 STRL (DRAPES) ×2 IMPLANT
DRSG ADAPTIC 3X8 NADH LF (GAUZE/BANDAGES/DRESSINGS) IMPLANT
DRSG PAD ABDOMINAL 8X10 ST (GAUZE/BANDAGES/DRESSINGS) IMPLANT
DURAPREP 26ML APPLICATOR (WOUND CARE) ×2 IMPLANT
ELECT CAUTERY BLADE 6.4 (BLADE) ×2 IMPLANT
ELECT REM PT RETURN 9FT ADLT (ELECTROSURGICAL) ×2
ELECTRODE REM PT RTRN 9FT ADLT (ELECTROSURGICAL) ×1 IMPLANT
EVACUATOR 1/8 PVC DRAIN (DRAIN) ×2 IMPLANT
FACESHIELD LNG OPTICON STERILE (SAFETY) ×2 IMPLANT
GLOVE BIO SURGEON STRL SZ7 (GLOVE) ×4 IMPLANT
GLOVE BIO SURGEON STRL SZ7.5 (GLOVE) ×6 IMPLANT
GLOVE BIOGEL PI IND STRL 7.0 (GLOVE) ×2 IMPLANT
GLOVE BIOGEL PI IND STRL 7.5 (GLOVE) ×1 IMPLANT
GLOVE BIOGEL PI INDICATOR 7.0 (GLOVE) ×2
GLOVE BIOGEL PI INDICATOR 7.5 (GLOVE) ×1
GLOVE BIOGEL PI ORTHO PRO 7.5 (GLOVE) ×1
GLOVE ECLIPSE 7.5 STRL STRAW (GLOVE) ×4 IMPLANT
GLOVE PI ORTHO PRO STRL 7.5 (GLOVE) ×1 IMPLANT
GLOVE SS BIOGEL STRL SZ 7.5 (GLOVE) ×1 IMPLANT
GLOVE SUPERSENSE BIOGEL SZ 7.5 (GLOVE) ×1
GLOVE SURG SS PI 7.0 STRL IVOR (GLOVE) ×2 IMPLANT
GOWN PREVENTION PLUS XLARGE (GOWN DISPOSABLE) ×8 IMPLANT
GOWN PREVENTION PLUS XXLARGE (GOWN DISPOSABLE) ×2 IMPLANT
GOWN STRL NON-REIN LRG LVL3 (GOWN DISPOSABLE) IMPLANT
GOWN STRL REIN XL XLG (GOWN DISPOSABLE) ×2 IMPLANT
HANDPIECE INTERPULSE COAX TIP (DISPOSABLE) ×2
HOOD PEEL AWAY FACE SHEILD DIS (HOOD) ×6 IMPLANT
IMMOBILIZER KNEE 22 UNIV (SOFTGOODS) ×2 IMPLANT
INSERT CUSHION PRONEVIEW LG (MISCELLANEOUS) IMPLANT
KIT BASIN OR (CUSTOM PROCEDURE TRAY) ×2 IMPLANT
KIT ROOM TURNOVER OR (KITS) ×2 IMPLANT
MANIFOLD NEPTUNE II (INSTRUMENTS) ×2 IMPLANT
NS IRRIG 1000ML POUR BTL (IV SOLUTION) ×2 IMPLANT
PACK TOTAL JOINT (CUSTOM PROCEDURE TRAY) ×2 IMPLANT
PAD ARMBOARD 7.5X6 YLW CONV (MISCELLANEOUS) ×2 IMPLANT
PAD CAST 4YDX4 CTTN HI CHSV (CAST SUPPLIES) IMPLANT
PADDING CAST COTTON 4X4 STRL (CAST SUPPLIES)
PADDING CAST COTTON 6X4 STRL (CAST SUPPLIES) IMPLANT
POSITIONER HEAD PRONE TRACH (MISCELLANEOUS) ×2 IMPLANT
RUBBERBAND STERILE (MISCELLANEOUS) ×2 IMPLANT
SET HNDPC FAN SPRY TIP SCT (DISPOSABLE) ×1 IMPLANT
SPONGE GAUZE 4X4 12PLY (GAUZE/BANDAGES/DRESSINGS) IMPLANT
STRIP CLOSURE SKIN 1/2X4 (GAUZE/BANDAGES/DRESSINGS) ×2 IMPLANT
SUCTION FRAZIER TIP 10 FR DISP (SUCTIONS) ×2 IMPLANT
SUT ETHIBOND NAB CT1 #1 30IN (SUTURE) IMPLANT
SUT MNCRL AB 3-0 PS2 18 (SUTURE) ×2 IMPLANT
SUT VIC AB 0 CT1 27 (SUTURE) ×4
SUT VIC AB 0 CT1 27XBRD ANBCTR (SUTURE) ×2 IMPLANT
SUT VIC AB 2-0 CT1 27 (SUTURE) ×4
SUT VIC AB 2-0 CT1 TAPERPNT 27 (SUTURE) ×2 IMPLANT
SUT VLOC 180 0 24IN GS25 (SUTURE) ×2 IMPLANT
SYR 30ML SLIP (SYRINGE) ×2 IMPLANT
TOWEL OR 17X24 6PK STRL BLUE (TOWEL DISPOSABLE) ×2 IMPLANT
TOWEL OR 17X26 10 PK STRL BLUE (TOWEL DISPOSABLE) ×2 IMPLANT
TRAY FOLEY CATH 14FR (SET/KITS/TRAYS/PACK) ×2 IMPLANT
WATER STERILE IRR 1000ML POUR (IV SOLUTION) ×2 IMPLANT

## 2012-07-20 NOTE — Preoperative (Signed)
Beta Blockers   Reason not to administer Beta Blockers:Not Applicable, last dose 07/20/12 at 0800

## 2012-07-20 NOTE — Anesthesia Preprocedure Evaluation (Addendum)
Anesthesia Evaluation  Patient identified by MRN, date of birth, ID band Patient awake    Reviewed: Allergy & Precautions, H&P , NPO status , Patient's Chart, lab work & pertinent test results  Airway Mallampati: I TM Distance: >3 FB Neck ROM: full    Dental  (+) Teeth Intact and Dental Advisory Given   Pulmonary COPDformer smoker,          Cardiovascular + CAD and + Past MI + dysrhythmias Atrial Fibrillation Rhythm:Irregular Rate:Normal  S/p AVR   Neuro/Psych    GI/Hepatic   Endo/Other    Renal/GU      Musculoskeletal   Abdominal   Peds  Hematology   Anesthesia Other Findings   Reproductive/Obstetrics                          Anesthesia Physical Anesthesia Plan  ASA: III  Anesthesia Plan: General and Regional   Post-op Pain Management: MAC Combined w/ Regional for Post-op pain   Induction: Intravenous  Airway Management Planned: Oral ETT  Additional Equipment:   Intra-op Plan:   Post-operative Plan: Extubation in OR  Informed Consent: I have reviewed the patients History and Physical, chart, labs and discussed the procedure including the risks, benefits and alternatives for the proposed anesthesia with the patient or authorized representative who has indicated his/her understanding and acceptance.     Plan Discussed with: CRNA and Surgeon  Anesthesia Plan Comments:         Anesthesia Quick Evaluation

## 2012-07-20 NOTE — Progress Notes (Signed)
UR COMPLETED  

## 2012-07-20 NOTE — Interval H&P Note (Signed)
History and Physical Interval Note:  07/20/2012 10:46 AM  [name]  has presented today for surgery, with the diagnosis of DJD LEFT KNEE  The various methods of treatment have been discussed with the patient and family. After consideration of risks, benefits and other options for treatment, the patient has consented to  Procedure(s) (LRB) with comments: TOTAL KNEE ARTHROPLASTY (Left) as a surgical intervention .  The patient's history has been reviewed, patient examined, no change in status, stable for surgery.  I have reviewed the patient's chart and labs.  Questions were answered to the patient's satisfaction.     [name]

## 2012-07-20 NOTE — Anesthesia Procedure Notes (Addendum)
Anesthesia Regional Block:  Femoral nerve block  Pre-Anesthetic Checklist: ,, timeout performed, Correct Patient, Correct Site, Correct Laterality, Correct Procedure, Correct Position, site marked, Risks and benefits discussed,  Surgical consent,  Pre-op evaluation,  At surgeon's request and post-op pain management  Laterality: Left  Prep: chloraprep       Needles:  Injection technique: Single-shot  Needle Type: Echogenic Stimulator Needle          Additional Needles:  Procedures: ultrasound guided and nerve stimulator Femoral nerve block  Nerve Stimulator or Paresthesia:  Response: 0.4 mA,   Additional Responses:   Narrative:  Start time: 07/20/2012 10:15 AM End time: 07/20/2012 10:30 AM Injection made incrementally with aspirations every 5 mL.  Performed by: Personally  Anesthesiologist: Arta Bruce MD  Additional Notes: Monitors applied. Patient sedated. Sterile prep and drape,hand hygiene and sterile gloves were used. Relevant anatomy identified.Needle position confirmed.Local anesthetic injected incrementally after negative aspiration. Local anesthetic spread visualized around nerve(s). Vascular puncture avoided. No complications. Image printed for medical record.The patient tolerated the procedure well.       Femoral nerve block Procedure Name: LMA Insertion Date/Time: 07/20/2012 11:01 AM Performed by: Jefm Miles E Pre-anesthesia Checklist: Patient identified, Timeout performed, Emergency Drugs available, Suction available and Patient being monitored Patient Re-evaluated:Patient Re-evaluated prior to inductionOxygen Delivery Method: Circle system utilized Preoxygenation: Pre-oxygenation with 100% oxygen Intubation Type: IV induction Ventilation: Mask ventilation without difficulty LMA: LMA inserted LMA Size: 4.0 Number of attempts: 1 Placement Confirmation: positive ETCO2 and breath sounds checked- equal and bilateral Tube secured with: Tape Dental Injury:  Teeth and Oropharynx as per pre-operative assessment

## 2012-07-20 NOTE — Anesthesia Postprocedure Evaluation (Signed)
Anesthesia Post Note  Patient: [name]  Procedure(s) Performed: Procedure(s) (LRB): TOTAL KNEE ARTHROPLASTY (Left)  Anesthesia type: general  Patient location: PACU  Post pain: Pain level controlled  Post assessment: Patient's Cardiovascular Status Stable  Last Vitals:  Filed Vitals:   07/20/12 1300  BP: 133/83  Pulse:   Temp: 36.4 C  Resp: 24    Post vital signs: Reviewed and stable  Level of consciousness: sedated  Complications: No apparent anesthesia complications

## 2012-07-20 NOTE — Progress Notes (Signed)
Orthopedic Tech Progress Note Patient Details:  [name] [facility] 10-24-1946 [id number]  CPM Left Knee CPM Left Knee: On Left Knee Flexion (Degrees): 60  Left Knee Extension (Degrees): 0    [name] 07/20/2012, 1:39 PM

## 2012-07-20 NOTE — Plan of Care (Signed)
Problem: Consults Goal: Diagnosis- Total Joint Replacement Primary Total Knee     

## 2012-07-20 NOTE — H&P (View-Only) (Signed)
[name] is an 66 y.o. male.   Chief Complaint: left knee DJD HPI: [name] is a 66 year old seen for evaluation for increasing left knee pain. He has pain with weightbearing and activity relieved by rest. He had a right total knee replacement on 04/01/11 and this is doing well but his left knee is bothering him progressively. Pain with every step and he intermittently needs an aide to ambulate and has trouble getting out of a chair without pain. This is worse with activity relieved by rest. He has history of gout. Past medical history: significant for aortic valve replacement in 2011, atrial fibrillation history of MI with a vfib rhythm. He's under the care of Dr. McLean from cardiology and Dr. McInnis is his primary care physician.  The excruciating pain is getting progressively worse, limiting activities of daily living, poses a significant fall risk, and has failed multiple conservative treatments including intraarticular cortisone injections, intraarticular Supartz, bracing, medication and home physical therapy.  Past Medical History  Diagnosis Date  . Myocardial infarction   . Ventricular fibrillation   . Aortic stenosis   . Atrial fibrillation or flutter   . Gout   . History of pneumonia   . Diverticulosis   . CAD (coronary artery disease)   . Left knee DJD     Past Surgical History  Procedure Date  . Right ankle pinning     Fell from tree w/fracture several years ago.  . 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 Social History:  reports that he quit smoking about 2 years ago. His smoking use included Cigars. He does not have any smokeless tobacco history on file. He reports that he drinks alcohol. He reports that he does not use illicit drugs.  Allergies:  Allergies  Allergen Reactions  . Diclofenac Itching  . Naproxen     itching   Current Outpatient Prescriptions  on File Prior to Visit  Medication Sig Dispense Refill  . acetaminophen (TYLENOL) 500 MG tablet Take 500 mg by mouth as needed. For knee pain.       . COLCRYS 0.6 MG tablet Take 1 tablet by mouth as needed.      . lisinopril (PRINIVIL,ZESTRIL) 5 MG tablet Take 1 tablet (5 mg total) by mouth daily.  30 tablet  5  . metoprolol succinate (TOPROL-XL) 50 MG 24 hr tablet Take 1 tablet (50 mg total) by mouth 2 (two) times daily.  60 tablet  5  . warfarin (COUMADIN) 5 MG tablet Take As Directed per Anticoagulation Clinic  60 tablet  6    (Not in a hospital admission)  No results found for this or any previous visit (from the past 48 hour(s)). No results found.  Review of Systems  Constitutional: Negative.   HENT: Negative.   Eyes: Positive for blurred vision.  Respiratory: Negative.   Cardiovascular: Negative.   Gastrointestinal: Negative.   Genitourinary: Negative.   Musculoskeletal:       Left knee pain  Skin: Negative.   Neurological: Negative.   Endo/Heme/Allergies: Negative.     Blood pressure 120/73, pulse 68, temperature 98.9 F (37.2 C), height 5' 9" (1.753 m), weight 83.462 kg (184 lb), SpO2 97.00%. Physical Exam  Constitutional: He is oriented to person, place, and time. He appears well-developed and well-nourished.  Cardiovascular:       Irregular rate and  rhythm  Respiratory: Effort   normal.  GI: Soft.  Genitourinary:       Not pertinent to current symptomatology therefore not examined.  Musculoskeletal:        Examination of his left knee reveals pain medially and laterally mild varus deformity 1+ crepitation 1+ synovitis, range of motion -5 to 120 degrees knee is stable with normal patella tracking. Exam of the right knee reveals well healed total knee incision without swelling or pain, full range of motion knee is stable with normal patella tracking. Vascular exam: pulses 2+ and symmetric.  Neurological: He is alert and oriented to person, place, and time.  Skin: Skin  is warm and dry.  Psychiatric: He has a normal mood and affect. His behavior is normal. Thought content normal.    X-RAYS: Left knee standing AP lateral flexion and sunrise views show end stage degenerative joint disease on the left with bone on bone arthritis and varus deformity. He has excellent position of his right total knee prosthesis.  Assessment Patient Active Problem List  Diagnosis  . CAD, NATIVE VESSEL  . AORTIC VALVE DISORDERS  . ATRIAL FIBRILLATION  . TACHYCARDIA  . Osteoarthritis  . Encounter for long-term (current) use of anticoagulants  . Left knee DJD    Plan I talk to him about this in detail. He has been cleared for left total knee replacement by Dr. McLean and Dr. McInnis. I do think that he's a satisfactory candidate for left total knee replacement due to failed conservative care. Discussed risks benefits and possible complications of the surgery in detail and he understands this completely.   Maleeha Halls J 07/07/2012, 3:27 PM    

## 2012-07-20 NOTE — Progress Notes (Signed)
ANTICOAGULATION CONSULT NOTE - Initial Consult  Pharmacy Consult for Coumadin Indication: atrial fibrillation; VTE prophylaxis s/p L TKA  Allergies  Allergen Reactions  . Diclofenac Itching  . Naproxen     itching    Patient Measurements:   Height ~ 69 inches Weight ~ 184 lbs  Vital Signs: Temp: 97.6 F (36.4 C) (09/09 1300) Temp src: Oral (09/09 0841) BP: 138/87 mmHg (09/09 1430) Pulse Rate: 82  (09/09 1444)  Labs:  Basename 07/20/12 0859  HGB --  HCT --  PLT --  APTT 29  LABPROT 13.8  INR 1.04  HEPARINUNFRC --  CREATININE --  CKTOTAL --  CKMB --  TROPONINI --   SCr 1.08 (07/14/12) CrCl ~ 70 ml/min    Medical History: Past Medical History  Diagnosis Date  . Myocardial infarction   . Aortic stenosis   . Gout   . History of pneumonia   . Diverticulosis   . CAD (coronary artery disease)   . Left knee DJD   . Ventricular fibrillation   . Atrial fibrillation or flutter   . COPD (chronic obstructive pulmonary disease)     bronchitis HX    Medications:  Prescriptions prior to admission  Medication Sig Dispense Refill  . acetaminophen (TYLENOL) 500 MG tablet Take 500 mg by mouth every 6 (six) hours as needed. For knee pain.      Marland Kitchen COLCRYS 0.6 MG tablet Take 1 tablet by mouth daily as needed. For gout flare up      . enoxaparin (LOVENOX) 120 MG/0.8ML injection Inject 0.8 mLs (120 mg total) into the skin daily.  10 Syringe  1  . lisinopril (PRINIVIL,ZESTRIL) 5 MG tablet Take 1 tablet (5 mg total) by mouth daily.  30 tablet  5  . metoprolol succinate (TOPROL-XL) 50 MG 24 hr tablet Take 1 tablet (50 mg total) by mouth 2 (two) times daily.  60 tablet  5  . warfarin (COUMADIN) 5 MG tablet Take 10 mg by mouth daily.        Assessment: 66 yo M on Coumadin PTA for hx AFib.  Coumadin was held prior to elective L TKA surgery (last dose 07/15/12) and patient ws bridged with Lovenox 120 mg SQ daily (last dose 07/19/12).  Now to restart anticoagulation post-op.  Goal of  Therapy:  INR 2-3   Plan:  Physician has restarted pt's PTA Coumadin regimen of 10 mg PO daily.  Pt is also on Lovenox 30 mg SQ q12 hours.  Daily INR has been ordered.  Pharmacy will continue to follow.  [employer], Pharm.D., BCPS Clinical Pharmacist Pager [phone] 07/20/2012 4:12 PM

## 2012-07-20 NOTE — Transfer of Care (Signed)
Immediate Anesthesia Transfer of Care Note  Patient: [name]  Procedure(s) Performed: Procedure(s) (LRB) with comments: TOTAL KNEE ARTHROPLASTY (Left) - left total knee arthroplasty  Patient Location: PACU  Anesthesia Type: General  Level of Consciousness: awake, alert  and oriented  Airway & Oxygen Therapy: Patient Spontanous Breathing and Patient connected to nasal cannula oxygen  Post-op Assessment: Report given to PACU RN  Post vital signs: Reviewed and stable  Complications: No apparent anesthesia complications

## 2012-07-20 NOTE — Op Note (Signed)
MRN:     [id number] DOB/AGE:    1946/04/18 / 66 y.o.       OPERATIVE REPORT    DATE OF PROCEDURE:  07/20/2012       PREOPERATIVE DIAGNOSIS:   DJD LEFT KNEE      Estimated Body mass index is 27.17 kg/(m^2) as calculated from the following:   Height as of 05/13/12: 5\' 9" (1.753 m).   Weight as of 05/13/12: 184 lb(83.462 kg).                                                        POSTOPERATIVE DIAGNOSIS:   DJD LEFT KNEE                                                                      PROCEDURE:  Procedure(s): TOTAL KNEE ARTHROPLASTY Using Depuy Sigma RP implants #4 Femur, #5Tibia, 15mm sigma RP bearing, 38 Patella     SURGEON: Caydn Justen A    ASSISTANT:  Kirstin Shepperson PA-C   (Present and scrubbed throughout the case, critical for assistance with exposure, retraction, instrumentation, and closure.)         ANESTHESIA: GET with Femoral Nerve Block  DRAINS: foley, 2 medium hemovac in knee   TOURNIQUET TIME:   COMPLICATIONS:  None     SPECIMENS: None   INDICATIONS FOR PROCEDURE: The patient has  DJD LEFT KNEE, varus deformities, XR shows bone on bone arthritis. Patient has failed all conservative measures including anti-inflammatory medicines, narcotics, attempts at  exercise and weight loss, cortisone injections and viscosupplementation.  Risks and benefits of surgery have been discussed, questions answered.   DESCRIPTION OF PROCEDURE: The patient identified by armband, received  right femoral nerve block and IV antibiotics, in the holding area at [facility] Patient taken to the operating room, appropriate anesthetic  monitors were attached General endotracheal anesthesia induced with  the patient in supine position, Foley catheter was inserted. Tourniquet  applied high to the operative thigh. Lateral post and foot positioner  applied to the table, the lower extremity was then prepped and draped  in usual sterile fashion from the ankle to the tourniquet. Time-out  procedure was performed. The limb was wrapped with an Esmarch bandage and the tourniquet inflated to 365 mmHg. We began the operation by making the anterior midline incision starting at handbreadth above the patella going over the patella 1 cm medial to and  4 cm distal to the tibial tubercle. Small bleeders in the skin and the  subcutaneous tissue identified and cauterized. Transverse retinaculum was incised and reflected medially and a medial parapatellar arthrotomy was accomplished. the patella was everted and theprepatellar fat pad resected. The superficial medial collateral  ligament was then elevated from anterior to posterior along the proximal  flare of the tibia and anterior half of the menisci resected. The knee was hyperflexed exposing bone on bone arthritis. Peripheral and notch osteophytes as well as the cruciate ligaments were then resected. We continued to  work our way around posteriorly along the proximal tibia, and externally  rotated the tibia  subluxing it out from underneath the femur. A McHale  retractor was placed through the notch and a lateral Hohmann retractor  placed, and we then drilled through the proximal tibia in line with the  axis of the tibia followed by an intramedullary guide rod and 2-degree  posterior slope cutting guide. The tibial cutting guide was pinned into place  allowing resection of 4 mm of bone medially and about 7 mm of bone  laterally because of her varus deformity. Satisfied with the tibial resection, we then  entered the distal femur 2 mm anterior to the PCL origin with the  intramedullary guide rod and applied the distal femoral cutting guide  set at 11mm, with 5 degrees of valgus. This was pinned along the  epicondylar axis. At this point, the distal femoral cut was accomplished without difficulty. We then sized for a #4 femoral component and pinned the guide in 3 degrees of external rotation.The chamfer cutting guide was pinned into place. The  anterior, posterior, and chamfer cuts were accomplished without difficulty followed by  the Sigma RP box cutting guide and the box cut. We also removed posterior osteophytes from the posterior femoral condyles. At this  time, the knee was brought into full extension. We checked our  extension and flexion gaps and found them symmetric at 15mm.  The patella thickness measured at 26 mm. We set the cutting guide at 17 and removed the posterior 9.5-10 mm  of the patella sized for 38 button and drilled the lollipop. The knee  was then once again hyperflexed exposing the proximal tibia. We sized for a #5 tibial base plate, applied the smokestack and the conical reamer followed by the the Delta fin keel punch. We then hammered into place the Sigma RP trial femoral component, inserted a 15-mm trial bearing, trial patellar button, and took the knee through range of motion from 0-130 degrees. No thumb pressure was required for patellar  tracking. At this point, all trial components were removed, a double batch of DePuy HV cement with 1500 mg of Zinacef was mixed and applied to all bony metallic mating surfaces except for the posterior condyles of the femur itself. In order, we  hammered into place the tibial tray and removed excess cement, the femoral component and removed excess cement, a 15-mm Sigma RP bearing  was inserted, and the knee brought to full extension with compression.  The patellar button was clamped into place, and excess cement  removed. While the cement cured the wound was irrigated out with normal saline solution pulse lavage, and medium Hemovac drains were placed.. Ligament stability and patellar tracking were checked and found to be excellent. The tourniquet was then released and hemostasis was obtained with cautery. The parapatellar arthrotomy was closed with  Z lock suture. The subcutaneous tissue with 0 and 2-0 undyed  Vicryl suture, and 4-0 Monocryl.. A dressing of Xeroform,  4 x 4,  dressing sponges, Webril, and Ace wrap applied. Needle and sponge count were correct times 2.The patient awakened, extubated, and taken to recovery room without difficulty. Vascular status was normal, pulses 2+ and symmetric.   Cheyeanne Roadcap A 07/20/2012, 12:28 PM

## 2012-07-21 ENCOUNTER — Encounter (HOSPITAL_COMMUNITY): Payer: Self-pay | Admitting: Orthopedic Surgery

## 2012-07-21 LAB — CBC
MCHC: 33.2 g/dL (ref 30.0–36.0)
WBC: 6.3 10*3/uL (ref 4.0–10.5)

## 2012-07-21 LAB — BASIC METABOLIC PANEL
BUN: 10 mg/dL (ref 6–23)
CO2: 30 mEq/L (ref 19–32)
Chloride: 101 mEq/L (ref 96–112)
GFR calc non Af Amer: 86 mL/min — ABNORMAL LOW (ref >90)
Glucose, Bld: 121 mg/dL — ABNORMAL HIGH (ref 70–99)
Potassium: 4.7 mEq/L (ref 3.5–5.1)
Sodium: 138 mEq/L (ref 135–145)

## 2012-07-21 LAB — PROTIME-INR: INR: 1.17 (ref 0.00–1.49)

## 2012-07-21 MED ORDER — SODIUM CHLORIDE 0.9 % IV BOLUS (SEPSIS)
500.0000 mL | Freq: Once | INTRAVENOUS | Status: AC
  Administered 2012-07-21: 500 mL via INTRAVENOUS

## 2012-07-21 MED ORDER — OXYCODONE HCL 10 MG PO TB12
20.0000 mg | ORAL_TABLET | Freq: Two times a day (BID) | ORAL | Status: DC
  Administered 2012-07-21 – 2012-07-22 (×3): 20 mg via ORAL
  Filled 2012-07-21: qty 1
  Filled 2012-07-21 (×2): qty 2
  Filled 2012-07-21: qty 1

## 2012-07-21 NOTE — Progress Notes (Signed)
ANTICOAGULATION CONSULT NOTE - Initial Consult  Pharmacy Consult for Coumadin Indication: atrial fibrillation; VTE prophylaxis s/p L TKA  Allergies  Allergen Reactions  . Diclofenac Itching  . Naproxen     itching    Patient Measurements: Height ~ 69 inches Weight ~ 184 lbs  Vital Signs: Temp: 97.4 F (36.3 C) (09/10 0533) BP: 121/76 mmHg (09/10 0533) Pulse Rate: 86  (09/10 0533)  Labs:  Basename 07/21/12 0625 07/20/12 0859  HGB 10.7* --  HCT 32.2* --  PLT 186 --  APTT -- 29  LABPROT 15.1 13.Tyler  INR 1.17 1.04  HEPARINUNFRC -- --  CREATININE 0.91 --  CKTOTAL -- --  CKMB -- --  TROPONINI -- --   SCr 1.08 (07/14/12) CrCl ~ 70 ml/min    Medical History: Past Medical History  Diagnosis Date  . Myocardial infarction   . Aortic stenosis   . Gout   . History of pneumonia   . Diverticulosis   . CAD (coronary artery disease)   . Left knee DJD   . Ventricular fibrillation   . Atrial fibrillation or flutter   . COPD (chronic obstructive pulmonary disease)     bronchitis HX    Medications:  Prescriptions prior to admission  Medication Sig Dispense Refill  . acetaminophen (TYLENOL) 500 MG tablet Take 500 mg by mouth every 6 (six) hours as needed. For knee pain.      Marland Kitchen COLCRYS 0.6 MG tablet Take 1 tablet by mouth daily as needed. For gout flare up      . enoxaparin (LOVENOX) 120 MG/0.8ML injection Inject 0.Tyler mLs (120 mg total) into the skin daily.  10 Syringe  1  . lisinopril (PRINIVIL,ZESTRIL) 5 MG tablet Take 1 tablet (5 mg total) by mouth daily.  30 tablet  5  . metoprolol succinate (TOPROL-XL) 50 MG 24 hr tablet Take 1 tablet (50 mg total) by mouth 2 (two) times daily.  60 tablet  5  . warfarin (COUMADIN) 5 MG tablet Take 10 mg by mouth daily.        Assessment: 66 yo Galloway on Coumadin PTA for hx AFib, s/p elective L TKA surgery, coumadin resumed last night, INR 1.17, trending up slightly. Pt. Is also on lovenox 30mg  sq q 12hrs, Hgb 10.7, plt 186, no bleeding per  chart  Coumadin home dose: 10mg  daily  Goal of Therapy:  INR 2-3   Plan:  - Continue coumadin 10mg  po x 1 - f/u INR daily - D/c Lovenox when INR > 2  Bayard Hugger, PharmD, BCPS  Clinical Pharmacist  Pager: [phone]   07/21/2012 9:08 AM

## 2012-07-21 NOTE — Progress Notes (Signed)
Referral received for SNF. Chart reviewed and CSW has spoken with RNCM who indicates that patient is for DC to home with Home Health and DME.  CSW to sign off. Please re-consult if CSW needs arise.  Jerusalem Wert T. Walker [place], LCSWA  [phone]  

## 2012-07-21 NOTE — Progress Notes (Signed)
CARE MANAGEMENT NOTE 07/21/2012  Patient:  [name], [name]   Account Number:  [id number]  Date Initiated:  07/21/2012  Documentation initiated by:  [name]  Subjective/Objective Assessment:   66 yr old male s/p left total knee arthroplasty.     Action/Plan:   CM spoke with patient regarding Home Health and DME needs at discharge.Pt states [name] is his [facility] provider. Has rolling walker and 3in1. CPm delivered to his home.   Anticipated DC Date:  07/22/2012   Anticipated DC Plan:  HOME W HOME HEALTH SERVICES      DC Planning Services  CM consult      [facility] Choice  HOME HEALTH   Choice offered to / List presented to:  C-1 Patient        HH arranged  HH-1 RN  HH-2 PT      [facility] agency  [facility]   Status of service:  Completed, signed off Medicare Important Message given?   (If response is "NO", the following Medicare IM given date fields will be blank) Date Medicare IM given:   Date Additional Medicare IM given:    Discharge Disposition:  HOME W HOME HEALTH SERVICES  Per UR Regulation:    If discussed at Long Length of Stay Meetings, dates discussed:    Comments:

## 2012-07-21 NOTE — Progress Notes (Signed)
Physical Therapy Treatment Patient Details Name: ANDREU DRUDGE MRN: [id number] DOB: [birth year] Today's Date: 07/21/2012 Time: 5621-3086 PT Time Calculation (min): 32 min  PT Assessment / Plan / Recommendation Comments on Treatment Session  Pt making good progress with mobility this session.  Very motivated.  Will need to perform stair training before d/c'ing home.      Follow Up Recommendations  Home health PT    Barriers to Discharge        Equipment Recommendations  None recommended by PT    Recommendations for Other Services    Frequency 7X/week   Plan Discharge plan remains appropriate    Precautions / Restrictions Precautions Precautions: Knee Restrictions LLE Weight Bearing: Weight bearing as tolerated    Pertinent Vitals/Pain 2/10  Lt knee.  Premedicated.      Mobility  Bed Mobility Bed Mobility: Sit to Supine Sit to Supine: 6: Modified independent (Device/Increase time);HOB flat Transfers Transfers: Sit to Stand;Stand to Sit Sit to Stand: 4: Min guard;With upper extremity assist;From chair/3-in-1;With armrests Stand to Sit: 4: Min guard;With upper extremity assist;To bed Details for Transfer Assistance: cues for hand placement & encouragement to increase use of LLE  Ambulation/Gait Ambulation/Gait Assistance: 4: Min guard Ambulation Distance (Feet): 100 Feet Assistive device: Rolling walker Ambulation/Gait Assistance Details: cues for sequencing, tall posture, safe management of RW, body positioning inside RW.  After being told to "just walk" pt increased fluidity & smoothness of gait pattern & did very well.   Gait Pattern: Step-through pattern;Decreased stance time - right Stairs: No Wheelchair Mobility Wheelchair Mobility: No      PT Goals Acute Rehab PT Goals Time For Goal Achievement: 07/26/12 Potential to Achieve Goals: Good PT Goal: Ambulate - Progress: Progressing toward goal  Visit Information  Last PT Received On: 07/21/12 Assistance  Needed: +1    Subjective Data  Subjective: "No pain, No gain" Patient Stated Goal: to go home   Cognition  Overall Cognitive Status: Appears within functional limits for tasks assessed/performed Arousal/Alertness: Awake/alert Orientation Level: Appears intact for tasks assessed;Oriented X4 / Intact Behavior During Session: [facility] for tasks performed    Balance  Balance Balance Assessed: No  End of Session PT - End of Session Equipment Utilized During Treatment: Gait belt Activity Tolerance: Patient tolerated treatment well Patient left: in bed;in CPM;with call bell/phone within reach Nurse Communication: Mobility status     Verdell Face, [place] [phone] 07/21/2012

## 2012-07-21 NOTE — Evaluation (Signed)
Physical Therapy Evaluation Patient Details Name: [name] MRN: [id number] DOB: 1946/07/17 Today's Date: 07/21/2012 Time: 7829-5621 PT Time Calculation (min): 31 min  PT Assessment / Plan / Recommendation Clinical Impression  Pt is a pleasent and cooperative 66 y.o. male s/p left knee TKA. Pt has a hx of right TKA and is familiar with PT process.  Patient demonstrates deficits in functional mobility secondary to pain, limited ROM, decreased activity tolerance and weakness. Rec continued acute PT to address deficits and maximize independence for discharge home.    PT Assessment  Patient needs continued PT services    Follow Up Recommendations  Home health PT    Barriers to Discharge        Equipment Recommendations  None recommended by PT    Recommendations for Other Services     Frequency 7X/week    Precautions / Restrictions Precautions Precautions: Knee Restrictions Weight Bearing Restrictions: Yes LLE Weight Bearing: Weight bearing as tolerated   Pertinent Vitals/Pain 7/10      Mobility  Bed Mobility Bed Mobility: Supine to Sit;Sitting - Scoot to Edge of Bed Supine to Sit: 5: Supervision Sitting - Scoot to [place] of Bed: 4: Min assist Details for Bed Mobility Assistance: LLE Transfers Transfers: Sit to Stand;Stand to Sit Sit to Stand: 4: Min guard;From bed Stand to Sit: 4: Min guard;To chair/3-in-1;With armrests Details for Transfer Assistance: VCs for hand placement Ambulation/Gait Ambulation/Gait Assistance: 4: Min guard Ambulation Distance (Feet): 8 Feet Assistive device: Rolling walker Ambulation/Gait Assistance Details: VCs for sequencing and weight bearing Gait Pattern: Step-to pattern;Decreased stride length;Antalgic;Decreased stance time - left Gait velocity: decreased General Gait Details: Pt requires VCs for upright posture and sequencing. Very antalgic with trunk flexion and some shuffling rrequired to advance LLE.    Exercises     PT  Diagnosis: Difficulty walking;Abnormality of gait;Generalized weakness;Acute pain  PT Problem List: Decreased strength;Decreased range of motion;Decreased activity tolerance;Decreased balance;Decreased mobility;Pain PT Treatment Interventions: DME instruction;Gait training;Stair training;Functional mobility training;Therapeutic activities;Therapeutic exercise;Patient/family education   PT Goals Acute Rehab PT Goals PT Goal Formulation: With patient Time For Goal Achievement: 07/26/12 Potential to Achieve Goals: Good Pt will Ambulate: >150 feet;with modified independence;with rolling walker PT Goal: Ambulate - Progress: Goal set today Pt will Go Up / Down Stairs: 3-5 stairs;with min assist;with rolling walker PT Goal: Up/Down Stairs - Progress: Goal set today Pt will Perform Home Exercise Program: Independently PT Goal: Perform Home Exercise Program - Progress: Goal set today  Visit Information  Last PT Received On: 07/21/12 Assistance Needed: +1    Subjective Data  Subjective: Didn't sleep well last night Patient Stated Goal: to go home   Prior Functioning  Home Living Lives With: Spouse Available Help at Discharge: Family;Friend(s) Type of Home: House Home Access: Stairs to enter [employer] of Steps: 2 Entrance Stairs-Rails: Left Home Layout: Two level;Able to live on main level with bedroom/bathroom;Full bath on main level Bathroom Shower/Tub: Tub/shower unit;Curtain Bathroom Toilet: Standard Home Adaptive Equipment: Walker - rolling;Straight cane;Shower chair with back;Bedside commode/3-in-1 Prior Function Level of Independence: Independent Able to Take Stairs?: Yes Driving: Yes 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 Communication Communication: No difficulties Dominant Hand: Right    Cognition  Overall Cognitive Status: Appears within functional limits for tasks assessed/performed Arousal/Alertness: Awake/alert Orientation Level:  Appears intact for tasks assessed;Oriented X4 / Intact Behavior During Session: [facility] for tasks performed    Extremity/Trunk Assessment Right Upper Extremity Assessment RUE ROM/Strength/Tone: [facility] for tasks assessed Left Upper Extremity Assessment LUE  ROM/Strength/Tone: [facility] for tasks assessed Right Lower Extremity Assessment RLE ROM/Strength/Tone: [facility] for tasks assessed Left Lower Extremity Assessment LLE ROM/Strength/Tone: Unable to fully assess;Due to pain;Due to precautions   Balance    End of Session PT - End of Session Equipment Utilized During Treatment: Gait belt Activity Tolerance: Patient tolerated treatment well;Patient limited by pain;Patient limited by fatigue Patient left: in chair;with call bell/phone within reach Nurse Communication: Mobility status CPM Left Knee CPM Left Knee: Off  GP     Fabio Asa 07/21/2012, 8:41 AM Charlotte Crumb, PT DPT  [phone]

## 2012-07-21 NOTE — Progress Notes (Signed)
Patient ID: [name], male   DOB: 06/20/1946, 66 y.o.   MRN: [id number] PATIENT ID: [name]  MRN: [id number]  DOB/AGE:  [birth year] / 66 y.o.  1 Day Post-Op Procedure(s) (LRB): TOTAL KNEE ARTHROPLASTY (Left)    PROGRESS NOTE Subjective: Patient is alert, oriented, no Nausea, no Vomiting, yes passing gas, no Bowel Movement. Taking PO well. Denies SOB, Chest or Calf Pain. Using Incentive Spirometer, PAS in place. Ambulate not yet, CPM 0-60 Patient reports pain as 8 on 0-10 scale  .    Objective: Vital signs in last 24 hours: Filed Vitals:   07/20/12 1443 07/20/12 1444 07/20/12 2053 07/21/12 0533  BP:   124/82 121/76  Pulse: 80 82 95 86  Temp:   98 F (36.7 C) 97.4 F (36.3 C)  TempSrc:      Resp:   18 18  SpO2: 100% 100% 100% 100%      Intake/Output from previous day: I/O last 3 completed shifts: In: 2760 [P.O.:360; I.V.:2350; IV Piggyback:50] Out: 1855 [Urine:1325; Drains:480; Blood:50]   Intake/Output this shift:     LABORATORY DATA:  Basename 07/21/12 0625 07/20/12 0859  WBC 6.3 --  HGB 10.7* --  HCT 32.2* --  PLT 186 --  NA -- --  K -- --  CL -- --  CO2 -- --  BUN -- --  CREATININE -- --  GLUCOSE -- --  GLUCAP -- --  INR 1.17 1.04  CALCIUM -- --    Examination: ABD soft Neurovascular intact Sensation intact distally Intact pulses distally Dorsiflexion/Plantar flexion intact Incision: dressing C/D/I}  Assessment:   1 Day Post-Op Procedure(s) (LRB): TOTAL KNEE ARTHROPLASTY (Left) ADDITIONAL DIAGNOSIS:  Acute Blood Loss Anemia Patient Active Problem List  Diagnosis  . CAD, NATIVE VESSEL  . Aortic valve disorders  . Atrial fibrillation  . TACHYCARDIA  . Osteoarthritis  . Encounter for long-term (current) use of anticoagulants  . Left knee DJD   Plan: PT/OT WBAT, CPM 5/hrs day until ROM 0-90 degrees, DVT Prophylaxis:  SCDx72hrs,lovenox with a coumadin bridge DISCHARGE PLAN: Home DISCHARGE NEEDS: HHPT NS bolus times 2 today for  hypotension associated with Acute postoperative blood loss anemia. Add oxycontin 20mg  for pain relief.  Dressing change tonight    Pascal Lux 07/21/2012, 7:47 AM

## 2012-07-22 ENCOUNTER — Inpatient Hospital Stay (HOSPITAL_COMMUNITY): Payer: Medicare Other

## 2012-07-22 DIAGNOSIS — J069 Acute upper respiratory infection, unspecified: Secondary | ICD-10-CM | POA: Clinically undetermined

## 2012-07-22 LAB — CBC
HCT: 28.8 % — ABNORMAL LOW (ref 39.0–52.0)
Hemoglobin: 9.9 g/dL — ABNORMAL LOW (ref 13.0–17.0)
MCV: 99.7 fL (ref 78.0–100.0)
RBC: 2.89 MIL/uL — ABNORMAL LOW (ref 4.22–5.81)
RDW: 12.9 % (ref 11.5–15.5)
WBC: 10.7 10*3/uL — ABNORMAL HIGH (ref 4.0–10.5)

## 2012-07-22 LAB — BASIC METABOLIC PANEL WITH GFR
BUN: 8 mg/dL (ref 6–23)
CO2: 24 meq/L (ref 19–32)
Calcium: 9.1 mg/dL (ref 8.4–10.5)
Chloride: 97 meq/L (ref 96–112)
Creatinine, Ser: 0.87 mg/dL (ref 0.50–1.35)
GFR calc Af Amer: >90 mL/min
GFR calc non Af Amer: 88 mL/min — ABNORMAL LOW
Glucose, Bld: 123 mg/dL — ABNORMAL HIGH (ref 70–99)
Potassium: 4.4 meq/L (ref 3.5–5.1)
Sodium: 130 meq/L — ABNORMAL LOW (ref 135–145)

## 2012-07-22 LAB — PROTIME-INR
INR: 1.35 (ref 0.00–1.49)
Prothrombin Time: 16.9 s — ABNORMAL HIGH (ref 11.6–15.2)

## 2012-07-22 MED ORDER — OXYCODONE HCL 5 MG PO TABS: ORAL_TABLET | ORAL | Status: DC

## 2012-07-22 MED ORDER — CEPHALEXIN 500 MG PO CAPS
500.0000 mg | ORAL_CAPSULE | Freq: Four times a day (QID) | ORAL | Status: DC
  Administered 2012-07-22 – 2012-07-23 (×4): 500 mg via ORAL
  Filled 2012-07-22 (×8): qty 1

## 2012-07-22 MED ORDER — BISACODYL 5 MG PO TBEC: 10.0000 mg | DELAYED_RELEASE_TABLET | Freq: Every day | ORAL | Status: AC | PRN

## 2012-07-22 MED ORDER — BISACODYL 10 MG RE SUPP
10.0000 mg | Freq: Once | RECTAL | Status: AC
  Administered 2012-07-22: 10 mg via RECTAL
  Filled 2012-07-22: qty 1

## 2012-07-22 MED ORDER — MAGNESIUM CITRATE PO SOLN
0.5000 | Freq: Once | ORAL | Status: AC
  Administered 2012-07-22: 0.5 via ORAL
  Filled 2012-07-22: qty 296

## 2012-07-22 MED ORDER — OXYCODONE HCL 20 MG PO TB12: ORAL_TABLET | ORAL | Status: DC

## 2012-07-22 MED ORDER — ENOXAPARIN SODIUM 30 MG/0.3ML ~~LOC~~ SOLN: 30.0000 mg | Freq: Two times a day (BID) | SUBCUTANEOUS | Status: DC

## 2012-07-22 MED ORDER — CEPHALEXIN 500 MG PO CAPS: 500.0000 mg | ORAL_CAPSULE | Freq: Four times a day (QID) | ORAL | Status: DC

## 2012-07-22 MED ORDER — DSS 100 MG PO CAPS: 100.0000 mg | ORAL_CAPSULE | Freq: Two times a day (BID) | ORAL | Status: AC

## 2012-07-22 NOTE — Progress Notes (Signed)
Physical Therapy Treatment Patient Details Name: [name] MRN: [id number] DOB: 1946/10/06 Today's Date: 07/22/2012 Time: 4098-1191 PT Time Calculation (min): 42 min  PT Assessment / Plan / Recommendation Comments on Treatment Session  Pt needing a lot of assist for sequencing and problem solving today (pain meds?) especially when performing stair pt making dangerous mistakes needing modA to correct. Wife present during stair training and reports they will have someone else present to assist. Wife reports she feels good about the technique and did not want to try backwards with RW. If pt still here tomorrow he will need to practice steps again. RN to call PA to discuss other family concerns (pt abdominal distension and discomfort and increased confusion).     Follow Up Recommendations  Home health PT    Barriers to Discharge        Equipment Recommendations  None recommended by PT    Recommendations for Other Services    Frequency     Plan      Precautions / Restrictions Precautions Precautions: Fall;Knee Restrictions Weight Bearing Restrictions: No LLE Weight Bearing: Weight bearing as tolerated       Mobility  Bed Mobility Supine to Sit: 4: Min assist;HOB elevated (60 degrees) Sitting - Scoot to Edge of Bed: 4: Min assist Sit to Supine: 4: Min assist;HOB flat;With rail Details for Bed Mobility Assistance: min cues for sequencing/problem solving movement and for follow through, assist for leg EOB Transfers Transfers: Sit to Stand;Stand to Sit Sit to Stand: 4: Min assist;With upper extremity assist;With armrests;From chair/3-in-1 Stand to Sit: 4: Min assist;With upper extremity assist;To bed Details for Transfer Assistance: Increased A due to pt feeling weak Ambulation/Gait Ambulation/Gait Assistance: 4: Min guard Ambulation Distance (Feet): 10 Feet Assistive device: Rolling walker Ambulation/Gait Assistance Details: cues for attention to task as well as stepping  sequence and safe technique with RW (tends to ambulate too far into RW with weight in his heels)  Gait Pattern: Step-to pattern;Antalgic;Decreased step length - left Stairs: Yes Stairs Assistance: 3: Mod assist Stairs Assistance Details (indicate cue type and reason): Pt needing a lot of encouragement to actually try the steps stating that he could do it although when asked to describe his technique he was unable to do so; pt needing min-mod verbal and tactile cueing for sequence and attention to task (pt with poor problem solving skills, ?pain meds) Stair Management Technique: One rail Left;Sideways Number of Stairs: 2      PT Goals Acute Rehab PT Goals PT Goal: Ambulate - Progress: Not met PT Goal: Up/Down Stairs - Progress: Progressing toward goal  Visit Information  Last PT Received On: 07/22/12 Assistance Needed: +1    Subjective Data  Subjective: Can you just tell me what I need to do because I don't think I can do stairs right now. I am just too sore.    Cognition  Overall Cognitive Status: Impaired Area of Impairment: Attention Arousal/Alertness: Lethargic (pain meds given recently) Orientation Level: Appears intact for tasks assessed Behavior During Session: [facility] for tasks performed Current Attention Level: Sustained Cognition - Other Comments: Per wife he has been a little out of it off an on today    Balance     End of Session PT - End of Session Equipment Utilized During Treatment: Gait belt Activity Tolerance: Patient limited by pain;Patient limited by fatigue Patient left: in chair;with call bell/phone within reach Nurse Communication: Mobility status;Other (comment) (family concerns)   GP     [facility] HELEN  07/22/2012, 2:05 PM

## 2012-07-22 NOTE — Progress Notes (Addendum)
ANTICOAGULATION CONSULT NOTE - Initial Consult  Pharmacy Consult for Coumadin Indication: atrial fibrillation; VTE prophylaxis s/p L TKA  Allergies  Allergen Reactions  . Diclofenac Itching  . Naproxen     itching    Patient Measurements: Height ~ 69 inches Weight ~ 184 lbs  Vital Signs: Temp: 99 F (37.2 C) (09/11 0644) BP: 113/63 mmHg (09/11 0939) Pulse Rate: 107  (09/11 0939)  Labs:  Basename 07/22/12 0545 07/21/12 0625 07/20/12 0859  HGB 9.9* 10.7* --  HCT 28.8* 32.2* --  PLT 150 186 --  APTT -- -- 29  LABPROT 16.9* 15.1 13.8  INR 1.35 1.17 1.04  HEPARINUNFRC -- -- --  CREATININE 0.87 0.91 --  CKTOTAL -- -- --  CKMB -- -- --  TROPONINI -- -- --   SCr 1.08 (07/14/12) CrCl ~ 70 ml/min    Assessment: 66 yo M on Coumadin PTA for hx AFib, s/p elective L TKA surgery, coumadin INR=1.35, trending up slightly. Pt. Is also on lovenox 30mg  sq q 12hrs, Hgb 9.9, plt 150. Likely for d/c soon.  Coumadin home dose: 10mg  daily  Goal of Therapy:  INR 2-3   Plan:  - Continue coumadin 10mg /day - f/u INR daily - D/c Lovenox when INR > 2  Harland German, Pharm D 07/22/2012 12:29 PM

## 2012-07-22 NOTE — Evaluation (Signed)
Occupational Therapy Evaluation and Discharge Patient Details Name: [name] MRN: [id number] DOB: 03/13/1946 Today's Date: 07/22/2012 Time: 8295-6213 OT Time Calculation (min): 16 min  OT Assessment / Plan / Recommendation Clinical Impression  This 65 yo male s/p LTKA presents to acute OT with all education completed and all OTneeds addresses, limiting factor now is that pt dose not feel well and c/o feeling weak. (RN/MD looking into this)    OT Assessment  Patient does not need any further OT services    Follow Up Recommendations  No OT follow up    Barriers to Discharge      Equipment Recommendations  None recommended by OT    Recommendations for Other Services    Frequency       Precautions / Restrictions Precautions Precautions: Fall;Knee Restrictions Weight Bearing Restrictions: No LLE Weight Bearing: Weight bearing as tolerated       ADL  Equipment Used: Rolling walker;Gait belt Transfers/Ambulation Related to ADLs: Min A due to pt feeling weak. ADL Comments: Pt currently no able to complete his LB/UB ADLs independently, but will have wife 24/7 until Sunday then wife is working on other help that may be able to stay with him when she is at work. Wife says she can help him with BADLs prn making sure that all these needs are addressed before she leaves for work. Pt has all needed DME for his other knee surgery 2 years ago.    OT Diagnosis:    OT Problem List:   OT Treatment Interventions:     OT Goals    Visit Information  Last OT Received On: 07/22/12 Assistance Needed: +1    Subjective Data  Subjective: I just don't feel like myself Patient Stated Goal: Really would like to go home today, but realizes that he needs to stay to make sure that nothing is going on   Prior Functioning  Vision/Perception  Home Living Lives With: Spouse Available Help at Discharge: Family;Friend(s);Available 24 hours/day (24/7 until Sunday) Type of Home: House Home  Access: Stairs to enter [employer] of Steps: 2 Entrance Stairs-Rails: Left Home Layout: Two level;Able to live on main level with bedroom/bathroom Bathroom Shower/Tub: Walk-in shower;Door [employer] Toilet: Standard Bathroom Accessibility: Yes How Accessible: Accessible via walker Home Adaptive Equipment: Walker - rolling;Straight cane;Shower chair with back;Bedside commode/3-in-1 Prior Function Level of Independence: Independent Able to Take Stairs?: Yes Driving: Yes 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 Communication Communication: No difficulties Dominant Hand: Right      Cognition  Overall Cognitive Status: Impaired Area of Impairment: Attention Arousal/Alertness: Lethargic (pain meds given recently) Orientation Level: Appears intact for tasks assessed Behavior During Session: [facility] for tasks performed Current Attention Level: Sustained Cognition - Other Comments: Per wife he has been a little out of it off an on today    Extremity/Trunk Assessment Right Upper Extremity Assessment RUE ROM/Strength/Tone: Within functional levels Left Upper Extremity Assessment LUE ROM/Strength/Tone: Within functional levels   Mobility  Shoulder Instructions  Bed Mobility Supine to Sit: 4: Min assist;HOB elevated (60 degrees) Sitting - Scoot to Edge of Bed: 4: Min assist Sit to Supine: 4: Min assist;HOB flat;With rail Details for Bed Mobility Assistance: min cues for sequencing/problem solving movement and for follow through, assist for leg EOB Transfers Transfers: Stand to Sit;Sit to Stand Sit to Stand: 4: Min assist;With upper extremity assist;With armrests;From chair/3-in-1 Stand to Sit: 4: Min assist;With upper extremity assist;To bed Details for Transfer Assistance: Increased A due to pt feeling weak  End of Session OT - End of Session Equipment Utilized During Treatment: Gait belt (RW) Activity Tolerance: Patient limited by fatigue (and  not feeling well) Patient left: in bed;with call bell/phone within reach (LLE in grey foam positioning device with ice underneath knee) Nurse Communication:  (Nurse in room when I was getting pt from recliner to bed)       Evette Georges [phone] 07/22/2012, 2:07 PM

## 2012-07-22 NOTE — Discharge Summary (Signed)
Patient ID: [name] MRN: [id number] DOB/AGE: [birth year] 66 y.o.  Admit date: 07/20/2012 Discharge date: 07/22/2012  Admission Diagnoses:  Principal Problem:  *Left knee DJD Active Problems:  CAD, NATIVE VESSEL  Aortic valve disorders  Atrial fibrillation  Osteoarthritis  Encounter for long-term (current) use of anticoagulants  Upper respiratory infection   Discharge Diagnoses:  Same  Past Medical History  Diagnosis Date  . Myocardial infarction   . Aortic stenosis   . Gout   . History of pneumonia   . Diverticulosis   . CAD (coronary artery disease)   . Left knee DJD   . Ventricular fibrillation   . Atrial fibrillation or flutter   . COPD (chronic obstructive pulmonary disease)     bronchitis HX    Surgeries: Procedure(s): TOTAL KNEE ARTHROPLASTY on 07/20/2012   Consultants:  none  Discharged Condition: Improved  Hospital Course: DEVEON KISIEL is an 66 y.o. male who was admitted 07/20/2012 for operative treatment ofLeft knee DJD. Patient has severe unremitting pain that affects sleep, daily activities, and work/hobbies. After pre-op clearance the patient was taken to the operating room on 07/20/2012 and underwent  Procedure(s): TOTAL KNEE ARTHROPLASTY.    Patient was given perioperative antibiotics: Anti-infectives     Start     Dose/Rate Route Frequency Ordered Stop   07/22/12 1000   cephALEXin (KEFLEX) capsule 500 mg        500 mg Oral 4 times daily 07/22/12 0811     07/22/12 0000   cephALEXin (KEFLEX) 500 MG capsule        500 mg Oral Every 6 hours 07/22/12 0817 08/01/12 2359   07/20/12 1700   ceFAZolin (ANCEF) IVPB 2 g/50 mL premix        2 g 100 mL/hr over 30 Minutes Intravenous Every 6 hours 07/20/12 1550 07/20/12 2336   07/20/12 1209   cefUROXime (ZINACEF) injection  Status:  Discontinued          As needed 07/20/12 1210 07/20/12 1251   07/19/12 1502   ceFAZolin (ANCEF) IVPB 2 g/50 mL premix        2 g 100 mL/hr over 30 Minutes Intravenous 60 min  pre-op 07/19/12 1502 07/20/12 1057           Patient was given sequential compression devices, early ambulation, and chemoprophylaxis to prevent DVT.  Post op day 2 patient has a slight fever and leukocytosis.  Ordered a chest xray and started on Keflex for upper respiratory tract infection.  Patient benefited maximally from hospital stay and there were no other complications.    Recent vital signs: Patient Vitals for the past 24 hrs:  BP Temp Pulse Resp SpO2  07/22/12 0644 123/67 mmHg 99 F (37.2 C) 97  18  100 %  07/28/12 2135 111/69 mmHg 98.6 F (37 C) 80  18  100 %  07-28-12 1400 110/72 mmHg 98.2 F (36.8 C) 89  18  95 %  2012-07-28 1232 106/63 mmHg - 95  - -  Jul 28, 2012 0937 103/63 mmHg - 89  - -     Recent laboratory studies:  [facility] 07/22/12 0545 Jul 28, 2012 0625  WBC 10.7* 6.3  HGB 9.9* 10.7*  HCT 28.8* 32.2*  PLT 150 186  NA 130* 138  K 4.4 4.7  CL 97 101  CO2 24 30  BUN 8 10  CREATININE 0.87 0.91  GLUCOSE 123* 121*  INR 1.35 1.17  CALCIUM 9.1 --     Discharge Medications:  Medication List     As of 07/22/2012  8:39 AM    STOP taking these medications         enoxaparin 120 MG/0.8ML injection   Commonly known as: LOVENOX      TAKE these medications         acetaminophen 500 MG tablet   Commonly known as: TYLENOL   Take 500 mg by mouth every 6 (six) hours as needed. For knee pain.      bisacodyl 5 MG EC tablet   Commonly known as: DULCOLAX   Take 2 tablets (10 mg total) by mouth daily as needed for constipation.      cephALEXin 500 MG capsule   Commonly known as: KEFLEX   Take 1 capsule (500 mg total) by mouth every 6 (six) hours.      COLCRYS 0.6 MG tablet   Generic drug: colchicine   Take 1 tablet by mouth daily as needed. For gout flare up      DSS 100 MG Caps   Take 100 mg by mouth 2 (two) times daily.      enoxaparin 30 MG/0.3ML injection   Commonly known as: LOVENOX   Inject 0.3 mLs (30 mg total) into the skin every 12 (twelve)  hours.      lisinopril 5 MG tablet   Commonly known as: PRINIVIL,ZESTRIL   Take 1 tablet (5 mg total) by mouth daily.      metoprolol succinate 50 MG 24 hr tablet   Commonly known as: TOPROL-XL   Take 1 tablet (50 mg total) by mouth 2 (two) times daily.      oxyCODONE 5 MG immediate release tablet   Commonly known as: Oxy IR/ROXICODONE   1-2 tab every 4-6 hrs as needed for pain (short acting pain meds)      oxyCODONE 20 MG 12 hr tablet   Commonly known as: OXYCONTIN   1 tab every 12 hrs for pain (long acting pain meds)      warfarin 5 MG tablet   Commonly known as: COUMADIN   Take 10 mg by mouth daily.        Diagnostic Studies: Dg Chest 2 View  07/14/2012  *RADIOLOGY REPORT*  Clinical Data: Preop for total knee replacement surgery.  CHEST - 2 VIEW  Comparison: 11/19/2011.  Findings: The cardiac silhouette, mediastinal and hilar contours are within normal limits and stable.  There is slightly tortuous thoracic aorta.  Stable surgical changes related to aortic valve placement surgery.  The lungs are clear.  No pleural effusion or pulmonary edema. Bilateral nipple shadows are noted.  The bony thorax is intact. Stable degenerative changes involving the thoracic spine.  IMPRESSION:  No acute cardiopulmonary findings.   Original Report Authenticated By: P. Loralie Champagne, M.D.    Dg Cervical Spine Complete  07/15/2012  *RADIOLOGY REPORT*  Clinical Data: Neck pain.  CERVICAL SPINE - COMPLETE 4+ VIEW  Comparison: None  Findings: Mild degenerative cervical spondylosis with disc disease and facet disease most notable at C5-6 and C6-7.  There is mild degenerative anterior subluxation of C7 compared with T1 likely due to facet disease.  Moderate uncinate spurring changes are noted at C5-6 and C6-7 bilaterally contributing to bony foraminal narrowing. No acute bony findings or abnormal prevertebral soft tissue swelling.  The C1-2 articulations are maintained.  The lung apices are clear.  IMPRESSION:   1.  Degenerative cervical spondylosis with disc disease and facet disease most notable in the lower cervical spine. 2.  Degenerative anterior subluxation of C7 compared with T1. 3.  Bilateral bony foraminal narrowing at C5-6 and C6-7.   Original Report Authenticated By: P. Loralie Champagne, M.D.     Disposition: 06-Home-Health Care Svc      Discharge Orders    Future Appointments: Provider: Department: Dept Phone: Center:   06/14/2013 8:15 AM Sherrie George, MD [facility] [phone] None     Future Orders Please Complete By Expires   Diet - low sodium heart healthy      Call MD / Call 911      Comments:   If you experience chest pain or shortness of breath, CALL 911 and be transported to the hospital emergency room.  If you develope a fever above 101 F, pus (white drainage) or increased drainage or redness at the wound, or calf pain, call your surgeon's office.   Constipation Prevention      Comments:   Drink plenty of fluids.  Prune juice may be helpful.  You may use a stool softener, such as Colace (over the counter) 100 mg twice a day.  Use MiraLax (over the counter) for constipation as needed.   Increase activity slowly as tolerated      Discharge instructions      Comments:   Total Knee Replacement Care After Refer to this sheet in the next few weeks. These discharge instructions provide you with general information on caring for yourself after you leave the hospital. Your caregiver may also give you specific instructions. Your treatment has been planned according to the most current medical practices available, but unavoidable complications sometimes occur. If you have any problems or questions after discharge, please call your caregiver. Regaining a near full range of motion of your knee within the first 3 to 6 weeks after surgery is critical. HOME CARE INSTRUCTIONS  You may resume a normal diet and activities as directed.  Perform exercises as directed.  Place yellow foam  block, yellow side up under heel at all times except when in CPM or when walking.  DO NOT modify, tear, cut, or change in any way. You will receive physical therapy daily  Take showers instead of baths until informed otherwise.  Change bandages (dressings)daily Do not take over-the-counter or prescription medicines for pain, discomfort, or fever. Eat a well-balanced diet.  Avoid lifting or driving until you are instructed otherwise.  Make an appointment to see your caregiver for stitches (suture) or staple removal as directed.  If you have been sent home with a continuous passive motion machine (CPM machine), 0-90 degrees 6 hrs a day   2 hrs a shift SEEK MEDICAL CARE IF: You have swelling of your calf or leg.  You develop shortness of breath or chest pain.  You have redness, swelling, or increasing pain in the wound.  There is pus or any unusual drainage coming from the surgical site.  You notice a bad smell coming from the surgical site or dressing.  The surgical site breaks open after sutures or staples have been removed.  There is persistent bleeding from the suture or staple line.  You are getting worse or are not improving.  You have any other questions or concerns.  SEEK IMMEDIATE MEDICAL CARE IF:  You have a fever.  You develop a rash.  You have difficulty breathing.  You develop any reaction or side effects to medicines given.  Your knee motion is decreasing rather than improving.  MAKE SURE YOU:  Understand these instructions.  Will watch your condition.  Will get help right away if you are not doing well or get worse.   CPM      Comments:   Continuous passive motion machine (CPM):      Use the CPM from 0 to 90 for 6 hours per day.       You may break it up into 2 or 3 sessions per day.      Use CPM for 2 weeks or until you are told to stop.   TED hose      Comments:   Use stockings (TED hose) for 2 weeks on both leg(s).  You may remove them at night for sleeping.    Change dressing      Comments:   Change the dressing daily with sterile 4 x 4 inch gauze dressing and apply TED hose.  You may clean the incision with alcohol prior to redressing.   Do not put a pillow under the knee. Place it under the heel.      Comments:   Place yellow foam block, yellow side up under heel at all times except when in CPM or when walking.  DO NOT modify, tear, cut, or change in any way the yellow foam block.      Follow-up Information    Follow up with Nilda Simmer, MD. On 08/03/2012. (appt 2 pm)    Contact information:   [facility] [address] [place] [address] [phone]           Signed: Pascal Lux 07/22/2012, 8:39 AM

## 2012-07-22 NOTE — Progress Notes (Signed)
Physical Therapy Treatment Patient Details Name: [name] MRN: [id number] DOB: [birth year] Today's Date: 07/22/2012 Time: 4098-1191 PT Time Calculation (min): 20 min  PT Assessment / Plan / Recommendation Comments on Treatment Session  Pt moving fairly slow this AM.  Reports he is very sore but states soreness is in UE's & denies any discomfort in LLE.  Session limited due to transporter arriving to transport pt for chest x-ray.  Still needs to do steps before going home.      Follow Up Recommendations  Home health PT    Barriers to Discharge        Equipment Recommendations  None recommended by PT    Recommendations for Other Services    Frequency 7X/week   Plan Discharge plan remains appropriate    Precautions / Restrictions Precautions Precautions: Knee Restrictions Weight Bearing Restrictions: Yes LLE Weight Bearing: Weight bearing as tolerated    Pertinent Vitals/Pain C/o "soreness" in UE's.  Denies any Lt knee soreness or pain.      Mobility  Bed Mobility Bed Mobility: Supine to Sit;Sitting - Scoot to Edge of Bed Supine to Sit: 4: Min assist;HOB flat Sitting - Scoot to [place] of Bed: 4: Min assist Details for Bed Mobility Assistance: assist for LLE Transfers Transfers: Sit to Stand;Stand to Sit Sit to Stand: 4: Min guard;With upper extremity assist;From bed Stand to Sit: 4: Min assist;With upper extremity assist;With armrests;Other (comment) (w/c) Details for Transfer Assistance: cues for hand placement & assist to position LLE with stand>sit Ambulation/Gait Ambulation/Gait Assistance: 4: Min guard Ambulation Distance (Feet): 40 Feet Assistive device: Rolling walker Ambulation/Gait Assistance Details: cues for sequencing, tall posture, body positioning inside RW, increased heel strike.  Pt reports he is very sore, but states the soreness is in his UE's & denies any soreness/pain in LLE, but objectively pt placing very little weight through LLE & using UE's  significantly to off-load weight from LLE despite cues to decrease use of UE's if able.   Gait Pattern: Step-to pattern;Antalgic;Decreased stance time - left;Decreased step length - right Stairs: No Wheelchair Mobility Wheelchair Mobility: No    Exercises Total Joint Exercises Ankle Circles/Pumps: AROM;Both;15 reps Quad Sets: AROM;Both;10 reps Straight Leg Raises: AAROM;Left;10 reps     PT Goals Acute Rehab PT Goals Time For Goal Achievement: 07/26/12 Potential to Achieve Goals: Good PT Goal: Ambulate - Progress: Not met  Visit Information  Last PT Received On: 07/22/12 Assistance Needed: +1    Subjective Data  Subjective: "Im so sore this morning" Patient Stated Goal: to go home   Cognition  Overall Cognitive Status: Appears within functional limits for tasks assessed/performed Arousal/Alertness: Awake/alert Orientation Level: Appears intact for tasks assessed;Oriented X4 / Intact Behavior During Session: [facility] for tasks performed    Balance  Balance Balance Assessed: No  End of Session PT - End of Session Equipment Utilized During Treatment: Gait belt Activity Tolerance: Patient limited by pain;Patient limited by fatigue Patient left: Other (comment) (leaving floor for chest x-ray) Nurse Communication: Mobility status CPM Left Knee CPM Left Knee: [address]     [place], [place] [phone] 07/22/2012

## 2012-07-23 ENCOUNTER — Inpatient Hospital Stay (HOSPITAL_COMMUNITY): Payer: Medicare Other

## 2012-07-23 DIAGNOSIS — M1710 Unilateral primary osteoarthritis, unspecified knee: Principal | ICD-10-CM

## 2012-07-23 DIAGNOSIS — K929 Disease of digestive system, unspecified: Secondary | ICD-10-CM

## 2012-07-23 DIAGNOSIS — K567 Ileus, unspecified: Secondary | ICD-10-CM | POA: Diagnosis not present

## 2012-07-23 DIAGNOSIS — K560 Paralytic ileus: Secondary | ICD-10-CM

## 2012-07-23 LAB — CBC
MCH: 33.5 pg (ref 26.0–34.0)
MCV: 97.3 fL (ref 78.0–100.0)
Platelets: 168 10*3/uL (ref 150–400)
RDW: 12.8 % (ref 11.5–15.5)
WBC: 12 10*3/uL — ABNORMAL HIGH (ref 4.0–10.5)

## 2012-07-23 LAB — BASIC METABOLIC PANEL
Calcium: 9 mg/dL (ref 8.4–10.5)
Creatinine, Ser: 0.84 mg/dL (ref 0.50–1.35)
GFR calc Af Amer: >90 mL/min (ref >90)
GFR calc non Af Amer: 89 mL/min — ABNORMAL LOW (ref >90)

## 2012-07-23 LAB — PROTIME-INR: Prothrombin Time: 18.8 seconds — ABNORMAL HIGH (ref 11.6–15.2)

## 2012-07-23 MED ORDER — ACETAMINOPHEN 10 MG/ML IV SOLN
1000.0000 mg | Freq: Three times a day (TID) | INTRAVENOUS | Status: AC | PRN
  Administered 2012-07-24 (×2): 1000 mg via INTRAVENOUS
  Filled 2012-07-23 (×4): qty 100

## 2012-07-23 MED ORDER — METOCLOPRAMIDE HCL 5 MG/ML IJ SOLN: 10.0000 mg | Freq: Once | INTRAMUSCULAR | Status: DC

## 2012-07-23 MED ORDER — KCL IN DEXTROSE-NACL 20-5-0.45 MEQ/L-%-% IV SOLN
INTRAVENOUS | Status: DC
  Administered 2012-07-24 – 2012-07-25 (×2): via INTRAVENOUS
  Administered 2012-07-26 (×2): 75 mL/h via INTRAVENOUS
  Filled 2012-07-23 (×9): qty 1000

## 2012-07-23 MED ORDER — BISACODYL 10 MG RE SUPP
10.0000 mg | Freq: Every day | RECTAL | Status: DC | PRN
  Administered 2012-07-23 – 2012-07-27 (×6): 10 mg via RECTAL
  Filled 2012-07-23 (×5): qty 1

## 2012-07-23 MED ORDER — METOCLOPRAMIDE HCL 5 MG/ML IJ SOLN
5.0000 mg | Freq: Three times a day (TID) | INTRAMUSCULAR | Status: DC
  Administered 2012-07-23 – 2012-07-24 (×4): 10 mg via INTRAVENOUS
  Filled 2012-07-23 (×8): qty 2

## 2012-07-23 MED ORDER — SODIUM CHLORIDE 0.9 % IV BOLUS (SEPSIS)
1000.0000 mL | Freq: Once | INTRAVENOUS | Status: AC
  Administered 2012-07-23: 1000 mL via INTRAVENOUS

## 2012-07-23 MED ORDER — METOPROLOL TARTRATE 1 MG/ML IV SOLN
5.0000 mg | Freq: Four times a day (QID) | INTRAVENOUS | Status: DC
  Administered 2012-07-24 (×2): 5 mg via INTRAVENOUS
  Filled 2012-07-23 (×8): qty 5

## 2012-07-23 MED ORDER — IOHEXOL 300 MG/ML  SOLN
100.0000 mL | Freq: Once | INTRAMUSCULAR | Status: AC | PRN
  Administered 2012-07-23: 100 mL via INTRAVENOUS

## 2012-07-23 MED ORDER — CEFAZOLIN SODIUM-DEXTROSE 2-3 GM-% IV SOLR
2.0000 g | Freq: Four times a day (QID) | INTRAVENOUS | Status: DC
  Administered 2012-07-23 – 2012-07-28 (×19): 2 g via INTRAVENOUS
  Filled 2012-07-23 (×21): qty 50

## 2012-07-23 MED ORDER — METOCLOPRAMIDE HCL 10 MG PO TABS
10.0000 mg | ORAL_TABLET | Freq: Three times a day (TID) | ORAL | Status: DC
  Filled 2012-07-23 (×4): qty 1

## 2012-07-23 MED ORDER — PEG 3350-KCL-NA BICARB-NACL 420 G PO SOLR: 2000.0000 mL | Freq: Once | ORAL | Status: DC

## 2012-07-23 NOTE — Consult Note (Signed)
Chart was reviewed and patient was examined. X-rays were reviewed.  The patient clearly has a postop ileus. I doubt there is a primary intra-abdominal process. X-ray findings are worse than  his clinical findings.  In addition to above recommendations including minimizing narcotics, I would continue by mouth or IV Reglan. Should he not improve in the next 24 hours we can try relistor specifically indicated for opiod-induced constipation.  Barbette Hair. Arlyce Dice, M.D., [facility] [phone]

## 2012-07-23 NOTE — Progress Notes (Signed)
Patient ID: MACIEJ DILLOW, male   DOB: 1946/10/07, 66 y.o.   MRN: [id number] PATIENT ID: KEVEN PYLES  MRN: [id number]  DOB/AGE:  July 07, 1946 / 66 y.o.  3 Days Post-Op Procedure(s) (LRB): TOTAL KNEE ARTHROPLASTY (Left)    PROGRESS NOTE Subjective: Patient is alert, oriented, no Nausea, no Vomiting, yes passing gas, yes Bowel Movement. Taking PO clear liquids. Denies SOB, Chest or Calf Pain. Using Incentive Spirometer, PAS in place. Ambulate well, CPM 0-60 Patient reports pain as 6 on 0-10 scale  .    Objective: Vital signs in last 24 hours: Filed Vitals:   07/22/12 0939 07/22/12 2000 07/22/12 2138 07/23/12 0500  BP: 113/63  105/60 115/67  Pulse: 107  91 105  Temp:   98.9 F (37.2 C) 99.5 F (37.5 C)  TempSrc:      Resp:  18 18 18   SpO2:  98% 96% 99%      Intake/Output from previous day: I/O last 3 completed shifts: In: 480 [P.O.:480] Out: 1326 [Urine:1325; Stool:1]   Intake/Output this shift:     LABORATORY DATA:  Basename 07/23/12 0535 07/22/12 0545  WBC 12.0* 10.7*  HGB 8.8* 9.9*  HCT 25.6* 28.8*  PLT 168 150  NA 130* 130*  K 4.1 4.4  CL 95* 97  CO2 25 24  BUN 11 8  CREATININE 0.84 0.87  GLUCOSE 121* 123*  GLUCAP -- --  INR 1.54* 1.35  CALCIUM 9.0 --    Examination: Neurovascular intact Sensation intact distally Intact pulses distally Incision: dressing C/D/I Compartment soft abdomen distended  bowel sounds high pitched but present}  Assessment:   3 Days Post-Op Procedure(s) (LRB): TOTAL KNEE ARTHROPLASTY (Left) ADDITIONAL DIAGNOSIS:  acute blood loss anemia Patient Active Problem List  Diagnosis  . CAD, NATIVE VESSEL  . Aortic valve disorders  . Atrial fibrillation  . TACHYCARDIA  . Osteoarthritis  . Encounter for long-term (current) use of anticoagulants  . Left knee DJD  . Upper respiratory infection  . Ileus, postoperative   Plan: PT/OT WBAT, CPM 5/hrs day until ROM 0-90 degrees,  DVT Prophylaxis:  SCDx72hrs, coumadin with lovenox  bridge DISCHARGE PLAN: Home DISCHARGE NEEDS: HHPT and HHRN New KUB today to eval ileus.  Continue keflex for upper respiratory tract infection.  Change Reglan from prn to scheduled.  Change Diet from NPO to clear liquids because of bowel movement yesterday.    Carrianne Hyun J 07/23/2012, 7:40 AM

## 2012-07-23 NOTE — Progress Notes (Signed)
NG tube placed at 1945 per order by Dr. Leone Payor. Currently at low intermittent suction. Bloody bile noted with insertion in beginning then yellow bile once suction hooked up. No c/o nausea, minimal pain to L knee, moderate pain to upper abd, slightly decreasing with insertion of NG tube. Drainage only in tubing, none in canister at this time. Will continue to monitor.

## 2012-07-23 NOTE — Progress Notes (Signed)
Utilization review completed. Kaedance Magos, RN, BSN. 

## 2012-07-23 NOTE — Consult Note (Signed)
Referring Provider: No ref. provider found Primary Care Physician:  Alice Reichert, MD Primary Gastroenterologist:  None  Reason for Consultation:  Post-op ileus.  HPI: [name] is a 66 y.o. male who underwent an elective left total knee replacement 3 days ago.  He was doing well initially post-op, but then developed nausea, some abdominal pain and distention.  Abdominal xray showed gas distention of the large and small bowel.  He has been given dulcolax suppositories x 2, colace 100mg  BID, one bottle of mag citrate, sorbitol with meals since surgery, and reglan which was just initiated 9/11.  He had 3 BM's yesterday; one "good" bowel movement and then two loose-watery BM's later.  One watery BM this AM.  Minimal 2/10 abdominal pain in LUQ only.  Mild distention, but he stands that it has gone down slightly.  No nausea or vomiting; tolerating clears.  Never had issues with constipation at home or post-op ileus with other surgeries.  Says that he has normal BM's twice a day without blood.  Had a colonoscopy probably almost 10 years ago in [place] at which time it was normal, and he was told to come back in ten years, according to his report.  Pain meds have been D/C'ed except for oxycodone.   Past Medical History  Diagnosis Date  . Myocardial infarction   . Aortic stenosis   . Gout   . History of pneumonia   . Diverticulosis   . CAD (coronary artery disease)   . Left knee DJD   . Ventricular fibrillation   . Atrial fibrillation or flutter   . COPD (chronic obstructive pulmonary disease)     bronchitis HX    Past Surgical History  Procedure Date  . Right ankle pinning     Fell from tree w/fracture several years ago.  . 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 Retinal laser procedure     right eye   . 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 Authorizing Provider  acetaminophen (TYLENOL) 500 MG tablet Take 500 mg by mouth every 6 (six) hours as needed. For knee pain.   Yes Historical Provider, MD  COLCRYS 0.6 MG tablet Take 1 tablet by mouth daily as needed. For gout flare up 04/30/12  Yes Historical Provider, MD  lisinopril (PRINIVIL,ZESTRIL) 5 MG tablet Take 1 tablet (5 mg total) by mouth daily. 07/06/12 07/06/13 Yes Laurey Morale, MD  metoprolol succinate (TOPROL-XL) 50 MG 24 hr tablet Take 1 tablet (50 mg total) by mouth 2 (two) times daily. 06/08/12  Yes Laurey Morale, MD  warfarin (COUMADIN) 5 MG tablet Take 10 mg by mouth daily.   Yes Historical Provider, MD  bisacodyl (DULCOLAX) 5 MG EC tablet Take 2 tablets (10 mg total) by mouth daily as needed for constipation. 07/22/12 08/21/12  Kirstin J Shepperson, PA  cephALEXin (KEFLEX) 500 MG capsule Take 1 capsule (500 mg total) by mouth every 6 (six) hours. 07/22/12 08/01/12  Kirstin J Shepperson, PA  docusate sodium 100 MG CAPS Take 100 mg by mouth 2 (two) times daily. 07/22/12 08/01/12  Kirstin J Shepperson, PA  enoxaparin (LOVENOX) 30 MG/0.3ML injection Inject 0.3 mLs (30 mg total) into the  skin every 12 (twelve) hours. 07/22/12   Kirstin J Shepperson, PA  oxyCODONE (OXY IR/ROXICODONE) 5 MG immediate release tablet 1-2 tab every 4-6 hrs as needed for pain (short acting pain meds) 07/22/12   Kirstin J Shepperson, PA  oxyCODONE (OXYCONTIN) 20 MG 12 hr tablet 1 tab every 12 hrs for pain (long acting pain meds) 07/22/12   Kirstin Tama Headings, PA    Current Facility-Administered Medications  Medication Dose Route Frequency Provider Last Rate Last Dose  . 0.9 % NaCl with KCl 20 mEq/ L  infusion   Intravenous Continuous Kirstin J Shepperson, PA 100 mL/hr at 07/21/12 0322    . acetaminophen (TYLENOL) tablet 650 mg  650 mg  Oral Q6H PRN Kirstin J Shepperson, PA   650 mg at 07/23/12 0804   Or  . acetaminophen (TYLENOL) suppository 650 mg  650 mg Rectal Q6H PRN Kirstin J Shepperson, PA      . bisacodyl (DULCOLAX) suppository 10 mg  10 mg Rectal Once Kirstin J Shepperson, PA   10 mg at 07/22/12 1541  . bisacodyl (DULCOLAX) suppository 10 mg  10 mg Rectal Daily PRN Kirstin J Shepperson, PA   10 mg at 07/23/12 0951  . cephALEXin (KEFLEX) capsule 500 mg  500 mg Oral QID Kirstin J Shepperson, PA   500 mg at 07/23/12 0957  . colchicine tablet 0.6 mg  0.6 mg Oral Daily PRN Kirstin J Shepperson, PA      . docusate sodium (COLACE) capsule 100 mg  100 mg Oral BID Kirstin J Shepperson, PA   100 mg at 07/23/12 0956  . enoxaparin (LOVENOX) injection 30 mg  30 mg Subcutaneous Q12H Kirstin J Shepperson, PA   30 mg at 07/23/12 0603  . lisinopril (PRINIVIL,ZESTRIL) tablet 5 mg  5 mg Oral Daily Kirstin J Shepperson, PA   5 mg at 07/22/12 0939  . menthol-cetylpyridinium (CEPACOL) lozenge 3 mg  1 lozenge Oral PRN Kirstin J Shepperson, PA       Or  . phenol (CHLORASEPTIC) mouth spray 1 spray  1 spray Mouth/Throat PRN Kirstin J Shepperson, PA      . metoCLOPramide (REGLAN) tablet 10 mg  10 mg Oral TID AC & HS Kirstin J Shepperson, PA       Or  . metoCLOPramide (REGLAN) injection 5-10 mg  5-10 mg Intravenous TID AC & HS Kirstin J Shepperson, PA      . metoprolol succinate (TOPROL-XL) 24 hr tablet 50 mg  50 mg Oral BID Kirstin J Shepperson, PA   50 mg at 07/23/12 0958  . ondansetron (ZOFRAN) tablet 4 mg  4 mg Oral Q6H PRN Kirstin J Shepperson, PA       Or  . ondansetron (ZOFRAN) injection 4 mg  4 mg Intravenous Q6H PRN Kirstin J Shepperson, PA      . oxyCODONE (Oxy IR/ROXICODONE) immediate release tablet 5-10 mg  5-10 mg Oral Q3H PRN Kirstin J Shepperson, PA   5 mg at 07/23/12 0353  . sorbitol 70 % solution 30 mL  30 mL Oral BID WC Kirstin J Shepperson, PA   30 mL at 07/23/12 0957  . warfarin (COUMADIN) tablet 10 mg  10 mg Oral Daily  Kirstin J Shepperson, PA   10 mg at 07/22/12 1700  . Warfarin - Pharmacist Dosing Inpatient   Does not apply q1800 Tyler Simmer, MD      . DISCONTD: HYDROmorphone (DILAUDID) injection 0.5 mg  0.5 mg Intravenous Q2H PRN Pascal Lux, PA  0.5 mg at 07/21/12 0743  . DISCONTD: metoCLOPramide (REGLAN) injection 5-10 mg  5-10 mg Intravenous Q8H PRN Kirstin J Shepperson, PA   10 mg at 07/22/12 1345  . DISCONTD: metoCLOPramide (REGLAN) tablet 5-10 mg  5-10 mg Oral Q8H PRN Pascal Lux, PA   5 mg at 07/23/12 1610  . DISCONTD: oxyCODONE (OXYCONTIN) 12 hr tablet 20 mg  20 mg Oral Q12H Kirstin J Shepperson, PA   20 mg at 07/22/12 9604    Allergies as of 06/10/2012 - Review Complete 05/13/2012  Allergen Reaction Noted  . Diclofenac Itching 08/09/2011  . Naproxen  02/07/2011    History reviewed. No pertinent family history.  History   Social History  . Marital Status: Married    Spouse Name: N/A    Number of Children: N/A  . Years of Education: N/A   Occupational History  . Not on file.   Social History Main Topics  . Smoking status: Former Smoker    Types: Cigars    Quit date: 04/24/2010  . Smokeless tobacco: Not on file   Comment: 1 a week  . Alcohol Use: Yes  . Drug Use: No  . Sexually Active: Not on file   Other Topics Concern  . Not on file   Social History Narrative  . No narrative on file    Review of Systems: Ten point ROS O/W negative except as mentioned in HPI.  Physical Exam: Vital signs in last 24 hours: Temp:  [98.9 F (37.2 C)-99.5 F (37.5 C)] 99.5 F (37.5 C) (09/12 0500) Pulse Rate:  [91-105] 99  (09/12 0959) Resp:  [18] 18  (09/12 0500) BP: (105-115)/(56-67) 110/56 mmHg (09/12 0959) SpO2:  [96 %-99 %] 99 % (09/12 0500) Last BM Date: 07/22/12 General:   Alert, Well-developed, well-nourished, pleasant and cooperative in NAD Head:  Normocephalic and atraumatic. Eyes:  Sclera clear, no icterus.  Conjunctiva pink. Ears:  Normal auditory  acuity. Mouth:  No deformity or lesions.   Lungs:  B/L course breath sounds. Heart:  Regular rate and rhythm; no murmurs, clicks, rubs, or gallops. Abdomen:  Softly distended, BS active, minimal TTP in LUQ. Rectal:  Deferred  Msk:  Symmetrical without gross deformities.  Left knee with dressings. Pulses:  Normal pulses noted. Extremities:  Without clubbing or edema. Neurologic:  Alert and  oriented x4;  grossly normal neurologically. Skin:  Intact without significant lesions or rashes. Psych:  Alert and cooperative. Normal mood and affect.  Intake/Output from previous day: 09/11 0701 - 09/12 0700 In: 0  Out: 651 [Urine:650; Stool:1] Intake/Output this shift: Total I/O In: 480 [P.O.:480] Out: 200 [Urine:200]  Lab Results:  [facility] 07/23/12 0535 07/22/12 0545 07/21/12 0625  WBC 12.0* 10.7* 6.3  HGB 8.8* 9.9* 10.7*  HCT 25.6* 28.8* 32.2*  PLT 168 150 186   BMET  Basename 07/23/12 0535 07/22/12 0545 07/21/12 0625  NA 130* 130* 138  K 4.1 4.4 4.7  CL 95* 97 101  CO2 25 24 30   GLUCOSE 121* 123* 121*  BUN 11 8 10   CREATININE 0.84 0.87 0.91  CALCIUM 9.0 9.1 8.9   PT/INR  Basename 07/23/12 0535 07/22/12 0545  LABPROT 18.8* 16.9*  INR 1.54* 1.35   Studies/Results: Dg Chest 2 View  07/22/2012  *RADIOLOGY REPORT*  Clinical Data: Cough and congestion.  Evaluate for atelectasis or pneumonia.  CHEST - 2 VIEW  Comparison: Chest x-ray 07/14/2012.  Findings: Lung volumes are normal.  No consolidative airspace disease.  Minimal blunting of the  left costophrenic sulcus noted only on the lateral view may represent some chronic pleural scarring or a trace left-sided pleural effusion.  No right pleural effusion.  Pulmonary vasculature is normal.  Heart size is within normal limits.  Mediastinal contours are unremarkable. Atherosclerosis in the thoracic aorta.  Status post median sternotomy for aortic valve replacement (a stented bioprosthesis valve is noted).  IMPRESSION: 1.  Possible  trace left pleural effusion versus left pleural scarring. 2.  No other radiographic evidence of acute cardiopulmonary disease. 3.  Atherosclerosis. 4.  Status post median sternotomy for aortic valve replacement.   Original Report Authenticated By: Florencia Reasons, M.D.    Dg Abd 1 View  07/23/2012  *RADIOLOGY REPORT*  Clinical Data: Postop ileus, abdominal distention  ABDOMEN - 1 VIEW  Comparison: Plain film abdomen 07/22/2012, CT 03/15/2009  Findings: Again demonstrated gas filled loops of large and small bowel.  These are not improved in the interval.  Therei s gas and stool in the rectum.  The cecum is elevated in the central abdomen.  IMPRESSION: Progressive gas distention of the large and small bowel.   Original Report Authenticated By: Genevive Bi, M.D.    Dg Abd 1 View  07/22/2012  *RADIOLOGY REPORT*  Clinical Data: Constipation.  Vomiting.  Rule out ileus.  ABDOMEN - 1 VIEW  Comparison: No priors.  Findings: Gas and stool are seen throughout the colon extending to the level of the distal rectum.  Multiple mildly dilated loops of gas-filled small bowel are seen in the abdomen, measuring up to approximately 3.3 cm in diameter.  No gross evidence of pneumoperitoneum.  IMPRESSION: 1.  Bowel gas pattern is nonspecific, but favored to represent an ileus, as above.   Original Report Authenticated By: Florencia Reasons, M.D.     IMPRESSION:  -Post-op ileus:  Secondary to anesthesia, narcotics, and little mobility.  Seems mild and likely will continue to improve with time. -S/p left TKR three days ago. -Hyponatremia-repletion per primary team.  PLAN: -Minimize narcotics as much as possible. -Mobilization as soon as possible. -Continue current bowel regimen, but will D/C sorbitol as it can increase gas production. -Observe.  Acey Woodfield D.  07/23/2012, 10:39 AM  Pager number [phone]   Patient was feeling well and looked well with unimpressive abdominal exam this morning.  Received a  call after 2pm from the PA, Kirsten, from ortho saying that he was feeling worse with more pain and called me back to see him because she was concerned about a perforation.  Most recent vital signs were stable.  He did appear uncomfortable with more distention during this return visit.  More abdominal tenderness as well.  Bowel sounds are present but quieter and less active than earlier today.  Complaining of mild nausea, but no vomiting.  If he begins to vomit then may need an NGT.  Will check STAT CT abdomen and pelvis before giving any further bowel regimen since the only imaging that has been performed to this point is an abdominal xray.

## 2012-07-23 NOTE — Progress Notes (Signed)
ANTICOAGULATION CONSULT NOTE - Initial Consult  Pharmacy Consult for Coumadin Indication: atrial fibrillation; VTE prophylaxis s/p L TKA  Allergies  Allergen Reactions  . Diclofenac Itching  . Naproxen     itching    Patient Measurements: Height ~ 69 inches Weight ~ 184 lbs  Vital Signs: Temp: 99.5 F (37.5 C) (09/12 0500) BP: 115/67 mmHg (09/12 0500) Pulse Rate: 105  (09/12 0500)  Labs:  Basename 07/23/12 0535 07/22/12 0545 07/21/12 0625 07/20/12 0859  HGB 8.8* 9.9* -- --  HCT 25.6* 28.8* 32.2* --  PLT 168 150 186 --  APTT -- -- -- 29  LABPROT 18.8* 16.9* 15.1 --  INR 1.54* 1.35 1.17 --  HEPARINUNFRC -- -- -- --  CREATININE 0.84 0.87 0.91 --  CKTOTAL -- -- -- --  CKMB -- -- -- --  TROPONINI -- -- -- --   SCr 1.08 (07/14/12) CrCl ~ 70 ml/min   Assessment: 66 yo M on Coumadin PTA for hx AFib, s/p elective L TKA surgery, coumadin INR=1.54 and trending up. Pt. Is also on lovenox 30mg  sq q 12hrs, Hgb 8.8, plt 168.   Coumadin home dose: 10mg  daily  Goal of Therapy:  INR 2-3   Plan:  - Continue coumadin 10mg /day - f/u INR daily - D/c Lovenox when INR > 2  Harland German, Pharm D 07/23/2012 8:20 AM

## 2012-07-23 NOTE — Progress Notes (Addendum)
PT Cancellation Note  Treatment cancelled today due to patient receiving procedure or test; patient at diagnostic radiology will follow up when patient returns.  [name] 07/23/2012, 8:35 AM

## 2012-07-24 ENCOUNTER — Inpatient Hospital Stay (HOSPITAL_COMMUNITY): Payer: Medicare Other

## 2012-07-24 LAB — CBC
Hemoglobin: 9.9 g/dL — ABNORMAL LOW (ref 13.0–17.0)
MCH: 33.6 pg (ref 26.0–34.0)
MCHC: 34.9 g/dL (ref 30.0–36.0)
Platelets: 248 10*3/uL (ref 150–400)
RDW: 12.6 % (ref 11.5–15.5)

## 2012-07-24 LAB — BASIC METABOLIC PANEL
Calcium: 9.4 mg/dL (ref 8.4–10.5)
GFR calc Af Amer: >90 mL/min (ref >90)
GFR calc non Af Amer: 84 mL/min — ABNORMAL LOW (ref >90)
Glucose, Bld: 147 mg/dL — ABNORMAL HIGH (ref 70–99)
Sodium: 132 mEq/L — ABNORMAL LOW (ref 135–145)

## 2012-07-24 LAB — PROTIME-INR
INR: 1.6 — ABNORMAL HIGH (ref 0.00–1.49)
Prothrombin Time: 19.3 seconds — ABNORMAL HIGH (ref 11.6–15.2)

## 2012-07-24 LAB — TROPONIN I
Troponin I: <0.3 ng/mL (ref <0.30)
Troponin I: <0.3 ng/mL (ref <0.30)

## 2012-07-24 MED ORDER — METOCLOPRAMIDE HCL 10 MG PO TABS: 10.0000 mg | ORAL_TABLET | Freq: Three times a day (TID) | ORAL | Status: DC

## 2012-07-24 MED ORDER — METHYLNALTREXONE BROMIDE 12 MG/0.6ML ~~LOC~~ SOLN
12.0000 mg | Freq: Once | SUBCUTANEOUS | Status: AC
  Administered 2012-07-24: 12 mg via SUBCUTANEOUS
  Filled 2012-07-24: qty 0.6

## 2012-07-24 MED ORDER — METOCLOPRAMIDE HCL 5 MG/ML IJ SOLN: 10.0000 mg | Freq: Three times a day (TID) | INTRAMUSCULAR | Status: DC

## 2012-07-24 MED ORDER — METOPROLOL TARTRATE 1 MG/ML IV SOLN
10.0000 mg | Freq: Four times a day (QID) | INTRAVENOUS | Status: DC
  Administered 2012-07-24 (×3): 10 mg via INTRAVENOUS
  Administered 2012-07-25: 5 mg via INTRAVENOUS
  Administered 2012-07-25 – 2012-07-28 (×11): 10 mg via INTRAVENOUS
  Filled 2012-07-24 (×21): qty 10

## 2012-07-24 MED ORDER — WARFARIN SODIUM 5 MG IV SOLR
12.5000 mg | Freq: Once | INTRAVENOUS | Status: AC
  Administered 2012-07-24: 12.5 mg via INTRAVENOUS
  Filled 2012-07-24: qty 6.25

## 2012-07-24 MED ORDER — METHYLNALTREXONE BROMIDE 12 MG/0.6ML ~~LOC~~ SOLN: 12.0000 mg | Freq: Once | SUBCUTANEOUS | Status: DC

## 2012-07-24 MED ORDER — METOCLOPRAMIDE HCL 5 MG/ML IJ SOLN
10.0000 mg | Freq: Four times a day (QID) | INTRAMUSCULAR | Status: DC
  Administered 2012-07-24 – 2012-07-28 (×16): 10 mg via INTRAVENOUS
  Filled 2012-07-24 (×19): qty 2

## 2012-07-24 NOTE — Progress Notes (Signed)
[facility] Progress Note  Subjective:  CT scan yesterday showed an ileus.  He was having nausea and more abdominal pain last night so Dr. Leone Payor ordered NGT to be placed to LIS.  600cc of bilious material in canister this AM.  Denies abdominal pain since NGT was placed.  One watery BM this AM.  Objective:  Vital signs in last 24 hours: Temp:  [97.4 F (36.3 C)-98.8 F (37.1 C)] 97.4 F (36.3 C) (09/13 0528) Pulse Rate:  [89-107] 107  (09/13 0528) Resp:  [18-20] 18  (09/13 0528) BP: (110-128)/(56-82) 128/82 mmHg (09/13 0528) SpO2:  [96 %-97 %] 97 % (09/13 0528) Weight:  [184 lb (83.462 kg)] 184 lb (83.462 kg) (09/12 1700) Last BM Date: 07/24/12 General:   Alert, Well-developed, in NAD.  600cc of bilious material in NGT canister. Heart:  Regular rate and rhythm; no murmurs Pulm:  Coarse breath sounds B/L. Abdomen:  Soft, but distended.  Bowel sounds present, but very hypoactive.  Minimal TTP. Extremities:  Without edema. Neurologic:  Alert and  oriented x4;  grossly normal neurologically. Psych:  Alert and cooperative. Normal mood and affect.  Intake/Output from previous day: 09/12 0701 - 09/13 0700 In: 1620 [P.O.:720; I.V.:900] Out: 700 [Urine:200; Emesis/NG output:500]  Lab Results:  Basename 07/24/12 0640 07/23/12 0535 07/22/12 0545  WBC 13.1* 12.0* 10.7*  HGB 9.9* 8.8* 9.9*  HCT 28.4* 25.6* 28.8*  PLT 248 168 150   BMET  Basename 07/24/12 0640 07/23/12 0535 07/22/12 0545  NA 132* 130* 130*  K 4.0 4.1 4.4  CL 95* 95* 97  CO2 28 25 24   GLUCOSE 147* 121* 123*  BUN 15 11 8   CREATININE 0.96 0.84 0.87  CALCIUM 9.4 9.0 9.1   PT/INR  Basename 07/24/12 0640 07/23/12 0535  LABPROT 19.3* 18.8*  INR 1.60* 1.54*   Dg Abd 1 View  07/24/2012  *RADIOLOGY REPORT*  Clinical Data: Ileus, abdominal pain, constipation  ABDOMEN - 1 VIEW  Comparison: CT of the abdomen pelvis of 07/23/2012  Findings: A supine film of the abdomen shows both large and small bowel gas to  be present most consistent with ileus.  An NG tube is noted with the tip in the region of the distal antrum.  No opaque calculi is seen.  Some contrast is present within the urinary bladder.  IMPRESSION: Gaseous distention of large and small bowel most consistent with diffuse ileus.   Original Report Authenticated By: Juline Patch, M.D.    Dg Abd 1 View  07/23/2012  *RADIOLOGY REPORT*  Clinical Data: Postop ileus, abdominal distention  ABDOMEN - 1 VIEW  Comparison: Plain film abdomen 07/22/2012, CT 03/15/2009  Findings: Again demonstrated gas filled loops of large and small bowel.  These are not improved in the interval.  Therei s gas and stool in the rectum.  The cecum is elevated in the central abdomen.  IMPRESSION: Progressive gas distention of the large and small bowel.   Original Report Authenticated By: Genevive Bi, M.D.    Dg Abd 1 View  07/22/2012  *RADIOLOGY REPORT*  Clinical Data: Constipation.  Vomiting.  Rule out ileus.  ABDOMEN - 1 VIEW  Comparison: No priors.  Findings: Gas and stool are seen throughout the colon extending to the level of the distal rectum.  Multiple mildly dilated loops of gas-filled small bowel are seen in the abdomen, measuring up to approximately 3.3 cm in diameter.  No gross evidence of pneumoperitoneum.  IMPRESSION: 1.  Bowel gas pattern is nonspecific, but  favored to represent an ileus, as above.   Original Report Authenticated By: Florencia Reasons, M.D.    Ct Abdomen Pelvis W Contrast  07/23/2012  *RADIOLOGY REPORT*  Clinical Data: Increasing abdominal pain and distention after knee surgery.  CT ABDOMEN AND PELVIS WITH CONTRAST  Technique:  Multidetector CT imaging of the abdomen and pelvis was performed following the standard protocol during bolus administration of intravenous contrast.  Contrast: OMNIPAQUE IOHEXOL 300 MG/ML  SOLN  Comparison: None.  Findings: Mild atelectatic change left lung base.  No significant effusion or visible pneumothorax.   Cardiomegaly.  No hepatic defects or dilated ducts. Tiny hepatic granuloma. Normal gallbladder.  The spleen is unremarkable.  Normal pancreas, adrenal glands, and kidneys.  Moderately distended fluid-filled loops of small bowel with air- fluid levels.  Moderately distended colon with air-fluid levels. No evidence for volvulus.  Transverse diameter the cecum 10.5 cm as measured on coronal image 28.  Decompressed rectosigmoid region without visible mass or fixed transition point.  Sigmoid diverticulosis without diverticulitis.  Unremarkable bladder, prostate, seminal vesicles.  No free fluid or free air.  Mild vascular calcification without aneurysmal dilatation.  Moderate degenerative change lumbar spine without worrisome osseous lesions in the spine or pelvis.  IMPRESSION: Findings consistent with postoperative ileus.  No free fluid or free air.   Original Report Authenticated By: Elsie Stain, M.D.     Assessment / Plan: -Post-op ileus: Secondary to anesthesia, narcotics, and little mobility.  Symptoms worsened throughout the day yesterday.  CT scan just showing ileus.  NGT placed. -Post-op day #4 from TKR. -Hyponatremia-repletion per primary team.   *Will give dose of relistor. *Continue to monitor amount of return via NGT. *As before, limit narcotics as much as possible and increase mobility.   LOS: 4 days   Ty Oshima D.  07/24/2012, 8:53 AM  Pager number [phone]   Received call from pharmacy stating that patient only meets two of the three criteria needed to give Relistor so they cannot fill the order.  Spoke with Dr. Arlyce Dice, will increase Reglan to 10mg  IV every 6 hours instead.

## 2012-07-24 NOTE — Progress Notes (Signed)
Physical Therapy Treatment Patient Details Name: [name] MRN: [id number] DOB: 04-10-1946 Today's Date: 07/24/2012 Time: 6962-9528 PT Time Calculation (min): 25 min  PT Assessment / Plan / Recommendation Comments on Treatment Session  Patient appears to be more comfortable this morning. Patient able to transfer and ambulate with no additional assist required.  Ambulation distance limited secondary to equipment (NG tube). Patient preformed ther-ex without difficulty.  ROM and strength in LLE appears to be improving steadily. Will continue to follow until discharge to maximize independence and reduce amount of assist needed.    Follow Up Recommendations  Home health PT    Barriers to Discharge        Equipment Recommendations  None recommended by PT    Recommendations for Other Services    Frequency 7X/week   Plan Discharge plan remains appropriate    Precautions / Restrictions Precautions Precautions: Knee Restrictions Weight Bearing Restrictions: Yes LLE Weight Bearing: Weight bearing as tolerated   Pertinent Vitals/Pain No pain reported at time of treatment    Mobility  Bed Mobility Bed Mobility: Supine to Sit;Sitting - Scoot to Edge of Bed Supine to Sit: 5: Supervision;HOB elevated Sitting - Scoot to Edge of Bed: 5: Supervision Transfers Transfers: Sit to Stand;Stand to Sit Sit to Stand: 5: Supervision;From bed;With upper extremity assist Stand to Sit: 5: Supervision;To chair/3-in-1;With armrests Details for Transfer Assistance: cues for hand placement Ambulation/Gait Ambulation/Gait Assistance: 4: Min guard Ambulation Distance (Feet): 10 Feet Assistive device: Rolling walker Ambulation/Gait Assistance Details: Guard for safety and management of lines and NG tube Gait Pattern: Step-to pattern;Antalgic;Decreased stance time - left;Decreased step length - right Stairs: No Wheelchair Mobility Wheelchair Mobility: No    Exercises Total Joint Exercises Ankle  Circles/Pumps: AROM;Both;10 reps Quad Sets: AROM;10 reps;Left Heel Slides: AROM;Left;10 reps Hip ABduction/ADduction: Strengthening;10 reps;Both Long Arc Quad: Strengthening;Left;10 reps Knee Flexion: AROM;Left;10 reps Goniometric ROM: 0-80   PT Goals Acute Rehab PT Goals Time For Goal Achievement: 07/26/12 Potential to Achieve Goals: Good PT Goal: Perform Home Exercise Program - Progress: Progressing toward goal  Visit Information  Last PT Received On: 07/24/12 Assistance Needed: +1    Subjective Data  Subjective: "Im so sore this morning" Patient Stated Goal: to go home   Cognition  Overall Cognitive Status: Appears within functional limits for tasks assessed/performed Arousal/Alertness: Awake/alert Orientation Level: Appears intact for tasks assessed;Oriented X4 / Intact Behavior During Session: [facility] for tasks performed    Balance  Balance Balance Assessed: No  End of Session PT - End of Session Equipment Utilized During Treatment: Gait belt Activity Tolerance: Patient tolerated treatment well Patient left: in chair;with call bell/phone within reach;with nursing in room (leaving floor for chest x-ray) Nurse Communication: Mobility status CPM Left Knee CPM Left Knee: Off   GP     Fabio Asa 07/24/2012, 11:06 AM Charlotte Crumb, PT DPT  [phone]

## 2012-07-24 NOTE — Progress Notes (Signed)
Pt began getting very uncomfortable r/t pressure in abd. Suppository given around 0330 with no results. Pt sat on BSC for 10 minutes and was so uncomfortable he had to get back to bed. Instructed to at least sit on edge of bed for a while in which he did and stated he felt much better. NG tube continues to put out green bile, currently around 400cc output total since placement. Abd still distended and tender with minimum nausea. Encouraged pt to sit in chair and become more mobile and he agreed to later in the morning. Will continue to monitor.

## 2012-07-24 NOTE — Progress Notes (Addendum)
Patient abdomen remains distended but soft.  Hypoactive bowel sounds.  Patient was given Relistor and suppository today.  Patient had loose stool, mostly water.  NG tube put out 1350 during 12hr shift.  New cannister placed in room for night shift.  Patient states he is feeling better.   Lucindia Lemley N

## 2012-07-24 NOTE — Progress Notes (Signed)
Patient ID: FARD JACEK, male   DOB: Nov 21, 1945, 66 y.o.   MRN: [id number] PATIENT ID: BERTON DRESSER  MRN: [id number]  DOB/AGE:  [birth year] / 66 y.o.  4 Days Post-Op Procedure(s) (LRB): TOTAL KNEE ARTHROPLASTY (Left)    PROGRESS NOTE Subjective: Patient is alert, oriented, yes Nausea, no Vomiting, yes passing gas, liquid stools Bowel Movement. Taking PO NPO. Denies SOB, Chest or Calf Pain. Using Incentive Spirometer, PAS in place. Ambulate well, CPM 0-60 Patient reports pain as 6 on 0-10 scale  .    Objective: Vital signs in last 24 hours: Filed Vitals:   07/23/12 1600 07/23/12 1700 07/23/12 2124 07/24/12 0528  BP:   122/71 128/82  Pulse:   105 107  Temp:   98.8 F (37.1 C) 97.4 F (36.3 C)  TempSrc:      Resp: 18  18 18   Height:  5\' 9"  (1.753 m)    Weight:  83.462 kg (184 lb)    SpO2:   97% 97%      Intake/Output from previous day: I/O last 3 completed shifts: In: 1620 [P.O.:720; I.V.:900] Out: 1200 [Urine:700; Emesis/NG output:500]   Intake/Output this shift:     LABORATORY DATA:  Basename 07/24/12 0640 07/23/12 0535 07/22/12 0545  WBC 13.1* 12.0* --  HGB 9.9* 8.8* --  HCT 28.4* 25.6* --  PLT 248 168 --  NA -- 130* 130*  K -- 4.1 4.4  CL -- 95* 97  CO2 -- 25 24  BUN -- 11 8  CREATININE -- 0.84 0.87  GLUCOSE -- 121* 123*  GLUCAP -- -- --  INR -- 1.54* 1.35  CALCIUM -- 9.0 --    Examination: Epigastric pain and discomfort. Abdomen is distended.  Left knee wound is well approximated and healing.  Pt is ambulating well with a walker.  Very uncomfortable.  Heart has an irregular rate and rhythm due to his afib.   Assessment:   4 Days Post-Op Procedure(s) (LRB): TOTAL KNEE ARTHROPLASTY (Left) ADDITIONAL DIAGNOSIS: Patient Active Problem List  Diagnosis  . CAD, NATIVE VESSEL  . Aortic valve disorders  . Atrial fibrillation  . TACHYCARDIA  . Osteoarthritis  . Encounter for long-term (current) use of anticoagulants  . Left knee DJD  . Upper respiratory  infection  . Ileus, postoperative       Plan: PT/OT WBAT, CPM 5/hrs day until ROM 0-90 degrees, DVT Prophylaxis:  SCDx72hrs, coumadin with lovenox bridge DISCHARGE PLAN: Home DISCHARGE NEEDS: HHPT and HHRN EKG ordered due to epigastric pain and significant cardiac history.  Unchanged when compared to EKG in January.  Waiting on cardiac enzymes.  Getting a new KUB.  Patient is tolerating NG tube.  Will increase his IV lopressor from 5 mg to 10 mg q 6 hrs due to his increased heart rate.  GI to continue to follow this ileus.  Patient just went to Xray for KUB today.    Latrenda Irani J 07/24/2012, 7:06 AM

## 2012-07-24 NOTE — Progress Notes (Signed)
I have personally taken an interval history, reviewed the chart, and examined the patient. Pt is starting to have BMs.  No nausea since NGT.  Abd remains benign Ileus appears to be resolving.  Can probably d/c NGT in the am, try clears.

## 2012-07-24 NOTE — Progress Notes (Signed)
ANTICOAGULATION CONSULT NOTE - Initial Consult  Pharmacy Consult for Coumadin Indication: atrial fibrillation; VTE prophylaxis s/p L TKA  Allergies  Allergen Reactions  . Diclofenac Itching  . Naproxen     itching    Patient Measurements: Height ~ 69 inches Weight ~ 184 lbs  Vital Signs: Temp: 97.4 F (36.3 C) (09/13 0528) BP: 128/82 mmHg (09/13 0528) Pulse Rate: 107  (09/13 0528)  Labs:  Basename 07/24/12 0640 07/23/12 0535 07/22/12 0545  HGB 9.9* 8.8* --  HCT 28.4* 25.6* 28.8*  PLT 248 168 150  APTT -- -- --  LABPROT 19.3* 18.8* 16.9*  INR 1.60* 1.54* 1.35  HEPARINUNFRC -- -- --  CREATININE 0.96 0.84 0.87  CKTOTAL -- -- --  CKMB -- -- --  TROPONINI <0.30 -- --   SCr 1.08 (07/14/12) CrCl ~ 70 ml/min   Assessment: 66 yo M on Coumadin PTA for hx AFib, s/p elective L TKA surgery, coumadin INR=1.6 and trending up slowly. Pt. Is also on lovenox 30mg  sq q 12hrs, Hgb 9.9, plt 248.  Patient now change to IV meds for post-op ileus  Coumadin home dose: 10mg  daily  Goal of Therapy:  INR 2-3   Plan:  - Coumadin 12.5mg  today (IV) - f/u INR daily - D/c Lovenox when INR > 2  Harland German, Pharm D 07/24/2012 8:10 AM

## 2012-07-25 LAB — BASIC METABOLIC PANEL
CO2: 28 mEq/L (ref 19–32)
Chloride: 99 mEq/L (ref 96–112)
Glucose, Bld: 111 mg/dL — ABNORMAL HIGH (ref 70–99)
Potassium: 3.4 mEq/L — ABNORMAL LOW (ref 3.5–5.1)
Sodium: 137 mEq/L (ref 135–145)

## 2012-07-25 LAB — CBC WITH DIFFERENTIAL/PLATELET
Basophils Absolute: 0 10*3/uL (ref 0.0–0.1)
Eosinophils Relative: 3 % (ref 0–5)
Lymphocytes Relative: 11 % — ABNORMAL LOW (ref 12–46)
Lymphs Abs: 0.7 10*3/uL (ref 0.7–4.0)
MCV: 97 fL (ref 78.0–100.0)
Neutro Abs: 4.9 10*3/uL (ref 1.7–7.7)
Platelets: 263 10*3/uL (ref 150–400)
RBC: 2.63 MIL/uL — ABNORMAL LOW (ref 4.22–5.81)
RDW: 12.8 % (ref 11.5–15.5)
WBC: 6.3 10*3/uL (ref 4.0–10.5)

## 2012-07-25 LAB — TROPONIN I: Troponin I: <0.3 ng/mL (ref <0.30)

## 2012-07-25 MED ORDER — WARFARIN SODIUM 5 MG IV SOLR
10.0000 mg | Freq: Once | INTRAVENOUS | Status: AC
  Administered 2012-07-25: 10 mg via INTRAVENOUS
  Filled 2012-07-25: qty 5

## 2012-07-25 MED ORDER — ACETAMINOPHEN 10 MG/ML IV SOLN
1000.0000 mg | Freq: Three times a day (TID) | INTRAVENOUS | Status: DC | PRN
  Administered 2012-07-25: 1000 mg via INTRAVENOUS
  Filled 2012-07-25: qty 100

## 2012-07-25 MED ORDER — ACETAMINOPHEN 10 MG/ML IV SOLN
1000.0000 mg | Freq: Four times a day (QID) | INTRAVENOUS | Status: DC
  Filled 2012-07-25 (×3): qty 100

## 2012-07-25 MED ORDER — ACETAMINOPHEN 10 MG/ML IV SOLN
1000.0000 mg | Freq: Three times a day (TID) | INTRAVENOUS | Status: AC | PRN
  Administered 2012-07-25 (×2): 1000 mg via INTRAVENOUS
  Filled 2012-07-25 (×2): qty 100

## 2012-07-25 MED ORDER — ACETAMINOPHEN 10 MG/ML IV SOLN
1000.0000 mg | Freq: Three times a day (TID) | INTRAVENOUS | Status: DC | PRN
  Filled 2012-07-25: qty 100

## 2012-07-25 NOTE — Progress Notes (Signed)
Subjective: Cross cover [facility] Patient wants to NGT tube out and wants to go home. Had one small volume BM this morning with passage of a small amount of flatus. Denies having any nausea or abdominal pain.   Objective: Vital signs in last 24 hours: Temp:  [97.6 F (36.4 C)-98.5 F (36.9 C)] 97.6 F (36.4 C) (09/14 0552) Pulse Rate:  [104-109] 109  (09/14 0552) Resp:  [18] 18  (09/14 0552) BP: (122-133)/(70-78) 122/70 mmHg (09/14 0552) SpO2:  [98 %] 98 % (09/14 0552) Last BM Date:  (07/24/2012)  Intake/Output from previous day: 09/13 0701 - 09/14 0700 In: 2650 [I.V.:900; NG/GT:1350; IV Piggyback:400] Out: 1575 [Urine:575; Emesis/NG output:1000] Intake/Output this shift: Total I/O In: 250 [IV Piggyback:250] Out: - NG output 300 cc since 7 AM   General appearance: alert, cooperative, appears stated age and mild distress Resp: clear to auscultation bilaterally Cardio: irregular rate and rhythm, S1, S2 irregular, no murmur, click, rub or gallop GI: soft, obese, slightly distended with very hypoactive bowel sounds; no masses,  no organomegaly  Lab Results:  [facility] 07/25/12 0554 07/24/12 0640 07/23/12 0535  WBC 6.3 13.1* 12.0*  HGB 9.2* 9.9* 8.8*  HCT 25.5* 28.4* 25.6*  PLT 263 248 168   BMET  Basename 07/25/12 0554 07/24/12 0640 07/23/12 0535  NA 137 132* 130*  K 3.4* 4.0 4.1  CL 99 95* 95*  CO2 28 28 25   GLUCOSE 111* 147* 121*  BUN 17 15 11   CREATININE 0.95 0.96 0.84  CALCIUM 8.8 9.4 9.0   LFT No results found for this basename: PROT,ALBUMIN,AST,ALT,ALKPHOS,BILITOT,BILIDIR,IBILI in the last 72 hours PT/INR  Basename 07/25/12 0554 07/24/12 0640  LABPROT 21.6* 19.3*  INR 1.84* 1.60*   Studies/Results: Dg Abd 1 View  07/24/2012  *RADIOLOGY REPORT*  Clinical Data: Ileus, abdominal pain, constipation  ABDOMEN - 1 VIEW  Comparison: CT of the abdomen pelvis of 07/23/2012  Findings: A supine film of the abdomen shows both large and small bowel gas to be present most  consistent with ileus.  An NG tube is noted with the tip in the region of the distal antrum.  No opaque calculi is seen.  Some contrast is present within the urinary bladder.  IMPRESSION: Gaseous distention of large and small bowel most consistent with diffuse ileus.   Original Report Authenticated By: Juline Patch, M.D.    Ct Abdomen Pelvis W Contrast  07/23/2012  *RADIOLOGY REPORT*  Clinical Data: Increasing abdominal pain and distention after knee surgery.  CT ABDOMEN AND PELVIS WITH CONTRAST  Technique:  Multidetector CT imaging of the abdomen and pelvis was performed following the standard protocol during bolus administration of intravenous contrast.  Contrast: OMNIPAQUE IOHEXOL 300 MG/ML  SOLN  Comparison: None.  Findings: Mild atelectatic change left lung base.  No significant effusion or visible pneumothorax.  Cardiomegaly.  No hepatic defects or dilated ducts. Tiny hepatic granuloma. Normal gallbladder.  The spleen is unremarkable.  Normal pancreas, adrenal glands, and kidneys.  Moderately distended fluid-filled loops of small bowel with air- fluid levels.  Moderately distended colon with air-fluid levels. No evidence for volvulus.  Transverse diameter the cecum 10.5 cm as measured on coronal image 28.  Decompressed rectosigmoid region without visible mass or fixed transition point.  Sigmoid diverticulosis without diverticulitis.  Unremarkable bladder, prostate, seminal vesicles.  No free fluid or free air.  Mild vascular calcification without aneurysmal dilatation.  Moderate degenerative change lumbar spine without worrisome osseous lesions in the spine or pelvis.  IMPRESSION: Findings  consistent with postoperative ileus.  No free fluid or free air.   Original Report Authenticated By: Elsie Stain, M.D.    Medications: I have reviewed the patient's current medications.  Assessment/Plan: 1) Post-op ileus; continue present care. Check and replete electrolytes as needed.  2) S/P left knee  arthroplasty.  3) Atrial fibrillation. 4) DJD. 5) CAD. 6) Anemia: hemoglobin 9.2 gm/dl.  LOS: 5 days   Shali Vesey 07/25/2012, 3:12 PM

## 2012-07-25 NOTE — Progress Notes (Signed)
ANTICOAGULATION CONSULT NOTE - Follow Up Consult  Pharmacy Consult for Warfarin Indication: atrial fibrillation and VTE prophylaxis  Allergies  Allergen Reactions  . Diclofenac Itching  . Naproxen     itching    Patient Measurements: Height: 5\' 9"  (175.3 cm) Weight: 184 lb (83.462 kg) IBW/kg (Calculated) : 70.7   Vital Signs: Temp: 97.6 F (36.4 C) (09/14 0552) BP: 122/70 mmHg (09/14 0552) Pulse Rate: 109  (09/14 0552)  Labs:  Alvira Philips 07/25/12 0554 07/25/12 0022 07/24/12 1823 07/24/12 1224 07/24/12 0640 07/23/12 0535  HGB 9.2* -- -- -- 9.9* --  HCT 25.5* -- -- -- 28.4* 25.6*  PLT 263 -- -- -- 248 168  APTT -- -- -- -- -- --  LABPROT 21.6* -- -- -- 19.3* 18.8*  INR 1.84* -- -- -- 1.60* 1.54*  HEPARINUNFRC -- -- -- -- -- --  CREATININE 0.95 -- -- -- 0.96 0.84  CKTOTAL -- -- -- -- -- --  CKMB -- -- -- -- -- --  TROPONINI -- <0.30 <0.30 <0.30 -- --    Estimated Creatinine Clearance: 76.5 ml/min (by C-G formula based on Cr of 0.95).   Medications:  Scheduled:    .  ceFAZolin (ANCEF) IV  2 g Intravenous Q6H  . docusate sodium  100 mg Oral BID  . enoxaparin (LOVENOX) injection  30 mg Subcutaneous Q12H  . lisinopril  5 mg Oral Daily  . methylnaltrexone  12 mg Subcutaneous Once  . metoCLOPramide (REGLAN) injection  10 mg Intravenous Q6H  . metoprolol  10 mg Intravenous Q6H  . warfarin  12.5 mg Intravenous ONCE-1800  . Warfarin - Pharmacist Dosing Inpatient   Does not apply q1800  . DISCONTD: acetaminophen  1,000 mg Intravenous Q6H  . DISCONTD: acetaminophen  1,000 mg Intravenous Q6H    Assessment: Pt is a 43 YOM s/p elective L TKA on warfarin and lovenox 30mg  [place] q12h. INR on 9/14 is 1.84 (from 1.60 on 9/13). Warfarin was held on 9/12 and re-started as IV on 9/13 d/t post op ileus making patient NPO. H/H 9.2/25.5 (from 9.9/28.4), Plts 263, no evidence of bleeding reported. Will continue with IV warfarin until patient is taken off NPO status  Goal of Therapy:  INR  2-3 Monitor platelets by anticoagulation protocol: Yes   Plan:  - Warfarin 10mg  IV x 1 at 1800 - Daily PT/INR - D/C lovenox when INR >2 for 24 hours - F/U CBC/signs of bleeding  [name], PharmD Clinical Pharmacist Phone: [phone] Pager: [phone] 07/25/2012 10:50 AM

## 2012-07-25 NOTE — Progress Notes (Signed)
[facility] (OTS)  Subjective: 5 Days Post-Op Procedure(s) (LRB): TOTAL KNEE ARTHROPLASTY (Left)  Pt states he is feeling a whole lot better today Pain controlled, states L knee pain is secondary at this point Still with NGT + flatus, + BM yesterday Pt is really thirsty No nausea  Objective: Current Vitals Blood pressure 122/70, pulse 109, temperature 97.6 F (36.4 C), temperature source Oral, resp. rate 18, height 5\' 9"  (1.753 m), weight 83.462 kg (184 lb), SpO2 98.00%. Vital signs in last 24 hours: Temp:  [97.6 F (36.4 C)-98.5 F (36.9 C)] 97.6 F (36.4 C) (09/14 0552) Pulse Rate:  [104-109] 109  (09/14 0552) Resp:  [18-20] 18  (09/14 0552) BP: (122-133)/(70-78) 122/70 mmHg (09/14 0552) SpO2:  [98 %-100 %] 98 % (09/14 0552)  Intake/Output from previous day: 09/13 0701 - 09/14 0700 In: 2650 [I.V.:900; NG/GT:1350; IV Piggyback:400] Out: 1575 [Urine:575; Emesis/NG output:1000] Intake/Output      09/13 0701 - 09/14 0700 09/14 0701 - 09/15 0700   P.O. 0    I.V. (mL/kg) 900 (10.8)    NG/GT 1350    IV Piggyback 400    Total Intake(mL/kg) 2650 (31.8)    Urine (mL/kg/hr) 575 (0.3)    Emesis/NG output 1000    Total Output 1575    Net +1075            LABS  Basename 07/25/12 0554 07/24/12 0640 07/23/12 0535  HGB 9.2* 9.9* 8.8*    Basename 07/25/12 0554 07/24/12 0640  WBC 6.3 13.1*  RBC 2.63* 2.95*  HCT 25.5* 28.4*  PLT 263 248    Basename 07/25/12 0554 07/24/12 0640  NA 137 132*  K 3.4* 4.0  CL 99 95*  CO2 28 28  BUN 17 15  CREATININE 0.95 0.96  GLUCOSE 111* 147*  CALCIUM 8.8 9.4    Basename 07/25/12 0554 07/24/12 0640  LABPT -- --  INR 1.84* 1.60*    Physical Exam  Gen: Awake and alert Lungs:clear anterior sounds Cardiac:irregular, s1 and s2 Abd: distended, NT, bowel sounds infrequent Ext: Left knee  Incision looks excellent  Distal motor and sensory functions intact  Ext is warm   + DP pulse  Swelling  stable     Imaging Dg Abd 1 View  07/24/2012  *RADIOLOGY REPORT*  Clinical Data: Ileus, abdominal pain, constipation  ABDOMEN - 1 VIEW  Comparison: CT of the abdomen pelvis of 07/23/2012  Findings: A supine film of the abdomen shows both large and small bowel gas to be present most consistent with ileus.  An NG tube is noted with the tip in the region of the distal antrum.  No opaque calculi is seen.  Some contrast is present within the urinary bladder.  IMPRESSION: Gaseous distention of large and small bowel most consistent with diffuse ileus.   Original Report Authenticated By: Juline Patch, M.D.    Ct Abdomen Pelvis W Contrast  07/23/2012  *RADIOLOGY REPORT*  Clinical Data: Increasing abdominal pain and distention after knee surgery.  CT ABDOMEN AND PELVIS WITH CONTRAST  Technique:  Multidetector CT imaging of the abdomen and pelvis was performed following the standard protocol during bolus administration of intravenous contrast.  Contrast: OMNIPAQUE IOHEXOL 300 MG/ML  SOLN  Comparison: None.  Findings: Mild atelectatic change left lung base.  No significant effusion or visible pneumothorax.  Cardiomegaly.  No hepatic defects or dilated ducts. Tiny hepatic granuloma. Normal gallbladder.  The spleen is unremarkable.  Normal pancreas, adrenal glands, and kidneys.  Moderately  distended fluid-filled loops of small bowel with air- fluid levels.  Moderately distended colon with air-fluid levels. No evidence for volvulus.  Transverse diameter the cecum 10.5 cm as measured on coronal image 28.  Decompressed rectosigmoid region without visible mass or fixed transition point.  Sigmoid diverticulosis without diverticulitis.  Unremarkable bladder, prostate, seminal vesicles.  No free fluid or free air.  Mild vascular calcification without aneurysmal dilatation.  Moderate degenerative change lumbar spine without worrisome osseous lesions in the spine or pelvis.  IMPRESSION: Findings consistent with  postoperative ileus.  No free fluid or free air.   Original Report Authenticated By: Elsie Stain, M.D.     Assessment/Plan: 5 Days Post-Op Procedure(s) (LRB): TOTAL KNEE ARTHROPLASTY (Left)  66 y/o male s/p L TKA  1. L TKA pod 5  WBAT  Continue with CPM  Up out of bed on own and with therapies  Encourage as much mobilization as possible  Dressing change prn  Ice and elevate  TED hose 2. Post op ileus  Subjectively improving per pt report  Still with significant NGT output  Defer to GI re: NGT removal  3. Hypokalemia  Mild  Pts ivf contain K+  Monitor  If drops any lower will replace as this can exacerbate ileus  Pt has also not had lisinopril in 2 days which may explain subtle drop but BP stable without 4. Tachycardia  Pt on lopressor IV as not taking po's at current time  Tachycardia likely due to pain  Will give IV tylenol again as that order expired  Limiting narcotics 5. DVT/PE prophylaxis  lovenox bridge to coumadin  INR 1.84 today 6. Activity  OOB as tolerated  Therapies 7. FEN  Continue NPO, defer change of diet to GI  Monitor potassium 8. dispo  Not medically stable for discharge today  Possibly tomorrow but this is unlikely. More probable Monday or tues    Mearl Latin, PA-C [facility] [phone] (P) 07/25/2012, 8:41 AM

## 2012-07-25 NOTE — Progress Notes (Signed)
Physical Therapy Treatment Patient Details Name: CIMARRON PUFAHL MRN: [id number] DOB: 18-Oct-1946 Today's Date: 07/25/2012 Time: 4098-1191 PT Time Calculation (min): 25 min  PT Assessment / Plan / Recommendation Comments on Treatment Session  Pt reports feeling much better today.   Performed stairs again today & pt did well.  Pt on track to d/c home when MD feels medically ready.      Follow Up Recommendations  Home health PT    Barriers to Discharge        Equipment Recommendations  None recommended by PT    Recommendations for Other Services    Frequency 7X/week   Plan Discharge plan remains appropriate    Precautions / Restrictions Precautions Precautions: Knee Restrictions LLE Weight Bearing: Weight bearing as tolerated    Pertinent Vitals/Pain No pain reported    Mobility  Bed Mobility Bed Mobility: Sit to Supine Sit to Supine: 6: Modified independent (Device/Increase time) Details for Bed Mobility Assistance: No cues or physical assist needed.   Transfers Transfers: Sit to Stand;Stand to Sit Sit to Stand: 6: Modified independent (Device/Increase time);With upper extremity assist;With armrests;From chair/3-in-1 Stand to Sit: 6: Modified independent (Device/Increase time);With upper extremity assist;With armrests;To chair/3-in-1;To bed Details for Transfer Assistance: Demonstrates proper & safe technique Ambulation/Gait Ambulation/Gait Assistance: 5: Supervision Ambulation Distance (Feet): 180 Feet Assistive device: Rolling walker Ambulation/Gait Assistance Details: Supervision for safety.  Improvement with step-through pattern but focusing on fluidity & smoothness of sequencing.   Gait Pattern: Step-through pattern Stairs: Yes Stairs Assistance: 4: Min guard Stairs Assistance Details (indicate cue type and reason): Cues for sequencing.   Stair Management Technique: One rail Left;Sideways Number of Stairs: 2  Wheelchair Mobility Wheelchair Mobility: No      PT  Goals Acute Rehab PT Goals Time For Goal Achievement: 07/26/12 Potential to Achieve Goals: Good PT Goal: Ambulate - Progress: Progressing toward goal PT Goal: Up/Down Stairs - Progress: Met  Visit Information  Last PT Received On: 07/25/12 Assistance Needed: +1    Subjective Data  Subjective: "im feeling much better" Patient Stated Goal: to go home   Cognition  Overall Cognitive Status: Appears within functional limits for tasks assessed/performed Arousal/Alertness: Awake/alert Orientation Level: Appears intact for tasks assessed;Oriented X4 / Intact Behavior During Session: [employer] for tasks performed    Balance  Balance Balance Assessed: No  End of Session PT - End of Session Equipment Utilized During Treatment: Gait belt Activity Tolerance: Patient tolerated treatment well Patient left: in bed;in CPM;with call bell/phone within reach Nurse Communication: Mobility status     Verdell Face, [place] [phone] 07/25/2012

## 2012-07-26 LAB — CBC
HCT: 26.8 % — ABNORMAL LOW (ref 39.0–52.0)
Hemoglobin: 9.1 g/dL — ABNORMAL LOW (ref 13.0–17.0)
MCHC: 34 g/dL (ref 30.0–36.0)
RBC: 2.72 MIL/uL — ABNORMAL LOW (ref 4.22–5.81)
WBC: 6.2 10*3/uL (ref 4.0–10.5)

## 2012-07-26 LAB — BASIC METABOLIC PANEL
BUN: 18 mg/dL (ref 6–23)
CO2: 29 mEq/L (ref 19–32)
Chloride: 99 mEq/L (ref 96–112)
GFR calc non Af Amer: 86 mL/min — ABNORMAL LOW (ref >90)
Glucose, Bld: 83 mg/dL (ref 70–99)
Potassium: 3.6 mEq/L (ref 3.5–5.1)
Sodium: 138 mEq/L (ref 135–145)

## 2012-07-26 LAB — PROTIME-INR
INR: 1.86 — ABNORMAL HIGH (ref 0.00–1.49)
Prothrombin Time: 21.8 seconds — ABNORMAL HIGH (ref 11.6–15.2)

## 2012-07-26 MED ORDER — WARFARIN SODIUM 5 MG IV SOLR
12.5000 mg | Freq: Once | INTRAVENOUS | Status: AC
  Administered 2012-07-26: 12.5 mg via INTRAVENOUS
  Filled 2012-07-26: qty 6.25

## 2012-07-26 NOTE — Progress Notes (Signed)
Subjective: Cross cover [facility] Since I last evaluated the patient, he seems to be doing fairly well. Has passed a lot of flatus and had a BM yesterday but not today. He denies having any nausea, vomiting or abdominal pain. Minimal NGT output.  Objective: Vital signs in last 24 hours: Temp:  [97.9 F (36.6 C)-98.5 F (36.9 C)] 97.9 F (36.6 C) (09/15 0643) Pulse Rate:  [91-93] 93  (09/15 0643) Resp:  [16-20] 16  (09/15 0643) BP: (114-137)/(77-86) 132/86 mmHg (09/15 0643) SpO2:  [97 %-99 %] 97 % (09/15 0643) Last BM Date: 07/25/12  Intake/Output from previous day: 09/14 0701 - 09/15 0700 In: 300 [IV Piggyback:300] Out: 1000 [Urine:600; Emesis/NG output:400] Intake/Output this shift:   General appearance: alert, cooperative, appears stated age, no distress and moderately obese Resp: clear to auscultation bilaterally Cardio: irregular irregular pulse, no murmur, click, rub or gallop GI: soft, obese, non-tender with hypoactive bowel sounds normal; no masses,  no organomegaly.  Lab Results:  Basename 07/26/12 0435 07/25/12 0554 07/24/12 0640  WBC 6.2 6.3 13.1*  HGB 9.1* 9.2* 9.9*  HCT 26.8* 25.5* 28.4*  PLT 294 263 248   BMET  Basename 07/26/12 0435 07/25/12 0554 07/24/12 0640  NA 138 137 132*  K 3.6 3.4* 4.0  CL 99 99 95*  CO2 29 28 28   GLUCOSE 83 111* 147*  BUN 18 17 15   CREATININE 0.92 0.95 0.96  CALCIUM 9.1 8.8 9.4   PT/INR  Basename 07/26/12 0435 07/25/12 0554  LABPROT 21.8* 21.6*  INR 1.86* 1.84*   Medications: I have reviewed the patient's current medications.  Assessment/Plan: Post-operative ileus-resolved. NGT removed. Will allow clears for now and advance as tolerated. Ambulation encouraged with liberal fluid intake.  LOS: 6 days   [name] 07/26/2012, 3:35 PM

## 2012-07-26 NOTE — Progress Notes (Signed)
ANTICOAGULATION CONSULT NOTE - Follow Up Consult  Pharmacy Consult for Warfarin Indication: atrial fibrillation and VTE prophylaxis  Allergies  Allergen Reactions  . Diclofenac Itching  . Naproxen     itching    Patient Measurements: Height: 5\' 9"  (175.3 cm) Weight: 184 lb (83.462 kg) IBW/kg (Calculated) : 70.7   Vital Signs: Temp: 97.9 F (36.6 C) (09/15 0643) Temp src: Oral (09/15 0643) BP: 132/86 mmHg (09/15 0643) Pulse Rate: 93  (09/15 0643)  Labs:  [name] 07/26/12 0435 07/25/12 0554 07/25/12 0022 07/24/12 1823 07/24/12 1224 07/24/12 0640  HGB 9.1* 9.2* -- -- -- --  HCT 26.8* 25.5* -- -- -- 28.4*  PLT 294 263 -- -- -- 248  APTT -- -- -- -- -- --  LABPROT 21.8* 21.6* -- -- -- 19.3*  INR 1.86* 1.84* -- -- -- 1.60*  HEPARINUNFRC -- -- -- -- -- --  CREATININE 0.92 0.95 -- -- -- 0.96  CKTOTAL -- -- -- -- -- --  CKMB -- -- -- -- -- --  TROPONINI -- -- <0.30 <0.30 <0.30 --    Estimated Creatinine Clearance: 79 ml/min (by C-G formula based on Cr of 0.92).   Medications:  Scheduled:     .  ceFAZolin (ANCEF) IV  2 g Intravenous Q6H  . docusate sodium  100 mg Oral BID  . enoxaparin (LOVENOX) injection  30 mg Subcutaneous Q12H  . lisinopril  5 mg Oral Daily  . metoCLOPramide (REGLAN) injection  10 mg Intravenous Q6H  . metoprolol  10 mg Intravenous Q6H  . warfarin  10 mg Intravenous ONCE-1800  . Warfarin - Pharmacist Dosing Inpatient   Does not apply q1800    Assessment: Pt is a 29 YOM s/p elective L TKA on warfarin and lovenox 30mg  [place] q12h. INR on 9/15 is 1.86 (from 1.84 on 9/14). Warfarin was held on 9/12 and re-started as IV on 9/13 d/t post op ileus making patient NPO. H/H 9.1/26.8 (from 9.2/25.5), Plts 294, no evidence of bleeding reported. Will continue with IV warfarin until patient is taken off NPO status. Will give an extra 2.5mg  with normal home dose of 10mg (total dose: 12.5mg ) to make up for the missed dose on 9/12.   Goal of Therapy:  INR  2-3 Monitor platelets by anticoagulation protocol: Yes   Plan:  - Warfarin 12.5mg  IV x 1 at 1800 - Daily PT/INR - D/C lovenox when INR >2 for 24 hours - F/U CBC/signs of bleeding  [name], PharmD Clinical Pharmacist Phone: [phone] Pager: [phone] 07/26/2012 10:56 AM

## 2012-07-26 NOTE — Progress Notes (Signed)
Patient has had a small bowel movement that was not liquid post suppository.  NG tube was removed this afternoon and patient has tolerated well.  Patient denies nausea and vomiting.  Abdomen is soft non-tender but bowel sounds remain hypoactive.  Patient advanced to clears per Dr. Charna Elizabeth of GI.  Per her note, patient can advance diet as tolerated and having bowel movements.  Harsha Yusko N

## 2012-07-26 NOTE — Progress Notes (Signed)
Physical Therapy Treatment Patient Details Name: [name] MRN: [id number] DOB: 1946/06/07 Today's Date: 07/26/2012 Time: 7829-5621 PT Time Calculation (min): 20 min  PT Assessment / Plan / Recommendation Comments on Treatment Session  Progressing well with increased acitivity today. Weak quad activation noted with ex's today.    Follow Up Recommendations  Home health PT       Equipment Recommendations  None recommended by PT       Frequency 7X/week   Plan Discharge plan remains appropriate;Frequency remains appropriate    Precautions / Restrictions Precautions Precautions: Knee Restrictions LLE Weight Bearing: Weight bearing as tolerated    Pertinent Vitals/Pain Reports some left knee pain, was premedicated.    Mobility  Bed Mobility Supine to Sit: 6: Modified independent (Device/Increase time);HOB flat Sitting - Scoot to Edge of Bed: 5: Supervision Details for Bed Mobility Assistance: no cues or physical assitance required Transfers Sit to Stand: 6: Modified independent (Device/Increase time);With upper extremity assist;From bed Stand to Sit: 6: Modified independent (Device/Increase time);With upper extremity assist;To chair/3-in-1;With armrests Details for Transfer Assistance: no cues or assistance required Ambulation/Gait Ambulation/Gait Assistance: 5: Supervision Ambulation Distance (Feet): 200 Feet Assistive device: Rolling walker Ambulation/Gait Assistance Details: mod cues for increased left knee flexion with swing phase and toe off phase, to increase right step lenght and for heel strike (heel-toe progression) with left foot for more reciprocal and fluid gait pattern. min cues for walker placment/advancement with this gait pattern. Gait Pattern: Step-through pattern;Step-to pattern;Decreased stance time - left;Decreased step length - right;Antalgic Gait velocity: increased General Gait Details: min cues for posture with gait and three short standing rest  breaks needed due to shortness of breath. cues for pacing as well with gait (slow down and for pursed lip breathing)    Exercises Total Joint Exercises Ankle Circles/Pumps: AROM;Strengthening;Both;10 reps;Supine Quad Sets: AROM;Strengthening;Left;10 reps;Supine (5 second holds) Heel Slides: Strengthening;Left;10 reps;Supine;AROM Straight Leg Raises: AAROM;Strengthening;Left;10 reps;Supine (3 second holds) Marching in Standing: AROM;Strengthening;Left;10 reps;Standing    PT Goals Acute Rehab PT Goals PT Goal: Ambulate - Progress: Progressing toward goal PT Goal: Perform Home Exercise Program - Progress: Progressing toward goal  Visit Information  Last PT Received On: 07/26/12 Assistance Needed: +1    Subjective Data  Subjective: No new complaints, agreeable to and eger for therapy today.   Cognition  Overall Cognitive Status: Appears within functional limits for tasks assessed/performed Arousal/Alertness: Awake/alert Orientation Level: Appears intact for tasks assessed Behavior During Session: [facility] for tasks performed       End of Session PT - End of Session Equipment Utilized During Treatment: Gait belt Activity Tolerance: Patient tolerated treatment well;Patient limited by fatigue Patient left: in chair;with call bell/phone within reach;with family/visitor present Nurse Communication: Mobility status   GP     Sallyanne Kuster 07/26/2012, 12:09 PM  Sallyanne Kuster, PTA Office- [phone]

## 2012-07-26 NOTE — Progress Notes (Signed)
[facility] ([facility])  Subjective: 6 Days Post-Op Procedure(s) (LRB): TOTAL KNEE ARTHROPLASTY (Left)  Doing ok this am  Feeling in better  Denies nausea No CP or SOB Sitting in bedside chair this am  Objective: Current Vitals Blood pressure 132/86, pulse 93, temperature 97.9 F (36.6 C), temperature source Oral, resp. rate 16, height 5\' 9"  (1.753 m), weight 83.462 kg (184 lb), SpO2 97.00%. Vital signs in last 24 hours: Temp:  [97.9 F (36.6 C)-98.5 F (36.9 C)] 97.9 F (36.6 C) (09/15 0643) Pulse Rate:  [91-93] 93  (09/15 0643) Resp:  [16-20] 16  (09/15 0643) BP: (114-137)/(77-86) 132/86 mmHg (09/15 0643) SpO2:  [97 %-99 %] 97 % (09/15 0643)  Intake/Output from previous day: 09/14 0701 - 09/15 0700 In: 300 [IV Piggyback:300] Out: 1000 [Urine:600; Emesis/NG output:400] Intake/Output      09/14 0701 - 09/15 0700 09/15 0701 - 09/16 0700   I.V. (mL/kg)     NG/GT     IV Piggyback 300    Total Intake(mL/kg) 300 (3.6)    Urine (mL/kg/hr) 600 (0.3)    Emesis/NG output 400    Total Output 1000    Net -700          NGT output from 1900-0700: 50cc  LABS  Basename 07/26/12 0435 07/25/12 0554 07/24/12 0640  HGB 9.1* 9.2* 9.9*    Basename 07/26/12 0435 07/25/12 0554  WBC 6.2 6.3  RBC 2.72* 2.63*  HCT 26.8* 25.5*  PLT 294 263    Basename 07/26/12 0435 07/25/12 0554  NA 138 137  K 3.6 3.4*  CL 99 99  CO2 29 28  BUN 18 17  CREATININE 0.92 0.95  GLUCOSE 83 111*  CALCIUM 9.1 8.8    Basename 07/26/12 0435 07/25/12 0554  LABPT -- --  INR 1.86* 1.84*     Physical Exam  Gen: Appears well, NAD, seems to be frustrated with current situation Lungs:clear Cardiac:irreg, s1 and s2 Abd: hypoactive BS, distended but soft, NT Ext:     Left Lower Extremity  Incision looks stable  No signs of infection  Moderate ecchymosis to L leg  Distal motor and sensory functions intact  Ext is warm  + DP pulse noted  No DCT     Imaging No results  found.  Assessment/Plan: 6 Days Post-Op Procedure(s) (LRB): TOTAL KNEE ARTHROPLASTY (Left)  66 y/o male s/p L TKA   1. L TKA pod 6  WBAT   Continue with CPM   Up out of bed on own and with therapies   Encourage as much mobilization as possible   Dressing change prn   Ice and elevate   TED hose  2. Post op ileus   Subjectively improving per pt report   Less output over last 12 hours  Possible removal of NGT today with initiation of clears, but will defer to GI    3. Hypokalemia   resolved 4. Tachycardia   Improved  BP's have been stable as well 5. DVT/PE prophylaxis   lovenox bridge to coumadin   INR 1.86 today  6. Activity   OOB as tolerated   Therapies  7. FEN  Continue NPO, defer change of diet to GI   Monitor potassium  8. dispo   Not medically stable for discharge today   Possibly tomorrow but this is unlikely. Probable d/c tuesday     Mearl Latin, PA-C [facility] [phone] (P) 07/26/2012, 9:14 AM

## 2012-07-27 ENCOUNTER — Inpatient Hospital Stay (HOSPITAL_COMMUNITY): Payer: Medicare Other

## 2012-07-27 LAB — PROTIME-INR: INR: 2.22 — ABNORMAL HIGH (ref 0.00–1.49)

## 2012-07-27 MED ORDER — CEPHALEXIN 500 MG PO CAPS: 500.0000 mg | ORAL_CAPSULE | Freq: Four times a day (QID) | ORAL | Status: AC

## 2012-07-27 MED ORDER — WARFARIN SODIUM 10 MG PO TABS
10.0000 mg | ORAL_TABLET | Freq: Once | ORAL | Status: AC
  Administered 2012-07-27: 10 mg via ORAL
  Filled 2012-07-27: qty 1

## 2012-07-27 MED ORDER — FLEET ENEMA 7-19 GM/118ML RE ENEM
2.0000 | ENEMA | Freq: Once | RECTAL | Status: AC
  Administered 2012-07-27: 2 via RECTAL
  Filled 2012-07-27: qty 2

## 2012-07-27 MED ORDER — ENOXAPARIN SODIUM 30 MG/0.3ML ~~LOC~~ SOLN: 30.0000 mg | Freq: Two times a day (BID) | SUBCUTANEOUS | Status: DC

## 2012-07-27 NOTE — Progress Notes (Signed)
Physical Therapy Treatment Patient Details Name: [name] MRN: [id number] DOB: [birth year] Today's Date: 07/27/2012 Time: 7829-5621 PT Time Calculation (min): 31 min  PT Assessment / Plan / Recommendation Comments on Treatment Session  Making steady progress towards physical therapy goals at this time.    Follow Up Recommendations  Home health PT    Barriers to Discharge        Equipment Recommendations  None recommended by PT    Recommendations for Other Services    Frequency 7X/week   Plan Discharge plan remains appropriate;Frequency remains appropriate    Precautions / Restrictions Precautions Precautions: Knee Restrictions LLE Weight Bearing: Weight bearing as tolerated   Pertinent Vitals/Pain 2/10    Mobility  Bed Mobility Supine to Sit: 6: Modified independent (Device/Increase time);HOB flat Sitting - Scoot to Edge of Bed: 6: Modified independent (Device/Increase time) Details for Bed Mobility Assistance: no cues or physical assitance required Transfers Sit to Stand: 6: Modified independent (Device/Increase time);With upper extremity assist;From bed Stand to Sit: 6: Modified independent (Device/Increase time);With upper extremity assist;To chair/3-in-1;With armrests Details for Transfer Assistance: no cues or assistance required Ambulation/Gait Ambulation/Gait Assistance: 5: Supervision Ambulation Distance (Feet): 300 Feet Assistive device: Rolling walker Gait Pattern: Step-through pattern;Step-to pattern;Decreased stance time - left;Decreased step length - right;Antalgic Gait velocity: increased General Gait Details: min cues for posture with gait and three short standing rest breaks needed due to shortness of breath. cues for pacing as well with gait (slow down and for pursed lip breathing)    Exercises Total Joint Exercises Ankle Circles/Pumps: AROM;Strengthening;Both;10 reps;Supine Quad Sets: AROM;Strengthening;Left;10 reps;Supine (5 second holds) Heel  Slides: Strengthening;Left;10 reps;Supine;AROM Hip ABduction/ADduction: Strengthening;Left;10 reps;Supine;AROM Straight Leg Raises: Strengthening;Left;10 reps;Supine Long Arc Quad: Strengthening;Left;10 reps;Supine;AROM Knee Flexion: AROM;Left;10 reps;Standing Marching in Standing: AROM;Strengthening;Left;10 reps;Standing    PT Goals Acute Rehab PT Goals PT Goal: Ambulate - Progress: Met PT Goal: Perform Home Exercise Program - Progress: Progressing toward goal  Visit Information  Last PT Received On: 07/27/12 Assistance Needed: +1    Subjective Data  Subjective: I'm feeling good, ready to go home Patient Stated Goal: to go home   Cognition  Overall Cognitive Status: Appears within functional limits for tasks assessed/performed Arousal/Alertness: Awake/alert Orientation Level: Appears intact for tasks assessed Behavior During Session: [facility] for tasks performed    Balance     End of Session PT - End of Session Equipment Utilized During Treatment: Gait belt Activity Tolerance: Patient tolerated treatment well;Patient limited by fatigue Patient left: in chair;with call bell/phone within reach;with family/visitor present Nurse Communication: Mobility status   GP     Fabio Asa 07/27/2012, 12:52 PM Charlotte Crumb, PT DPT  [phone]

## 2012-07-27 NOTE — Progress Notes (Signed)
[facility] Progress Note  Subjective:  Asked to come back and see patient for "large stool bolus".  Patient states that he feels well.  NGT removed and has no nausea or vomiting with liquids.  No abdominal pain.  Had a decent BM yesterday per the patient and his wife.  Passing a lot of gas this AM.  Ambulating hallways.  Repeat abdominal xray this AM shows increasing gaseous distention despite patient's resolving symptoms.  Patient is no longer receiving narcotics.  Has been getting reglan 10 mg IV four times a day.  Had one dose of relistor on 9/13, bottle of mag citrate early last week, and several dulcolax suppositories.    Objective:  Vital signs in last 24 hours: Temp:  [97.9 F (36.6 C)-98 F (36.7 C)] 98 F (36.7 C) (09/16 0111) Pulse Rate:  [112-114] 112  (09/16 0111) Resp:  [18-20] 18  (09/16 0400) BP: (109-135)/(65-81) 109/65 mmHg (09/16 0111) SpO2:  [99 %] 99 % (09/16 0111) Last BM Date: 07/25/12 General:   Alert, Well-developed, in NAD. Heart:  Tachy but regular rhythm; no murmurs. Pulm:  Mild expiratory wheezing heard B/L. Abdomen:  Soft, slightly distended.  Active bowel sounds.  Non-tender. Extremities:  Without edema. Neurologic:  Alert and  oriented x4;  grossly normal neurologically. Psych:  Alert and cooperative. Normal mood and affect.  Intake/Output from previous day: 09/15 0701 - 09/16 0700 In: 200 [IV Piggyback:200] Out: 551 [Urine:550; Stool:1]  Lab Results:  [facility] 07/26/12 0435 07/25/12 0554  WBC 6.2 6.3  HGB 9.1* 9.2*  HCT 26.8* 25.5*  PLT 294 263   BMET  Basename 07/26/12 0435 07/25/12 0554  NA 138 137  K 3.6 3.4*  CL 99 99  CO2 29 28  GLUCOSE 83 111*  BUN 18 17  CREATININE 0.92 0.95  CALCIUM 9.1 8.8   PT/INR  Basename 07/27/12 0700 07/26/12 0435  LABPROT 25.0* 21.8*  INR 2.22* 1.86*   Dg Abd 1 View  07/27/2012  *RADIOLOGY REPORT*  Clinical Data: Postop ileus  ABDOMEN - 1 VIEW  Comparison: Abdomen film of 07/24/2012   Findings: Both large and small bowel gas remains most consistent with ileus.  Gaseous distention of the cecum has increased somewhat measuring approximately 14.3 cm in transverse diameter.  IMPRESSION: Increasing gaseous distention of the cecum.  Otherwise little change in apparent ileus pattern.   Original Report Authenticated By: Juline Patch, M.D.     Assessment / Plan: -Post-op ileus-clinically looks well despite worsening distention on xray. -S/P left TKR 7 days ago  *Will give two fleets enemas. *Advance diet to soft.   LOS: 7 days   ZEHR, JESSICA D.  07/27/2012, 9:43 AM  Pager number [phone]    GI ATTENDING  PATIENT SEEN AND EXAMINED. X-RAYS AND LABS PERSONALLY REVIEWED. AGREE WITH ABOVE AS OUTLINED. THE PATIENT HAD POST-OP ILEUS. HE CONTINUES TO MAKE GOOD CLINICAL PROGRESS. TOLERATING DIET, MOVING BOWELS, AND PAIN FREE. POTASSIUM CORRECTED, NOT USING NARCOTICS, AND AMBULATING. X-RAYS SHOW FOCAL DILATION OF RIGHT COLON BUT OTHERWISE OK. EXAM IS BENIGN. RECOMMEND ADVANCING DIET AND FOLLOWING HIM CLINICALLY. ADMINISTERED ENEMAS NOTED.Marland Kitchen X-RAYS ARE OF LITTLE VALUE AT THIS POINT. WE WILL HAPPILY FOLLOW. THANKS.  Wilhemina Bonito. Eda Keys., M.D. [facility] Division of Gastroenterology

## 2012-07-27 NOTE — Progress Notes (Signed)
ANTICOAGULATION CONSULT NOTE - Follow Up Consult  Pharmacy Consult for Warfarin Indication: atrial fibrillation and VTE prophylaxis  Allergies  Allergen Reactions  . Diclofenac Itching  . Naproxen     itching    Patient Measurements: Height: 5\' 9"  (175.3 cm) Weight: 184 lb (83.462 kg) IBW/kg (Calculated) : 70.7   Vital Signs: Temp: 98 F (36.7 C) (09/16 0111) Temp src: Oral (09/16 0111) BP: 109/65 mmHg (09/16 0111) Pulse Rate: 112  (09/16 0111)  Labs:  Basename 07/27/12 0700 07/26/12 0435 07/25/12 0554 07/25/12 0022 07/24/12 1823 07/24/12 1224  HGB -- 9.1* 9.2* -- -- --  HCT -- 26.8* 25.5* -- -- --  PLT -- 294 263 -- -- --  APTT -- -- -- -- -- --  LABPROT 25.0* 21.8* 21.6* -- -- --  INR 2.22* 1.86* 1.84* -- -- --  HEPARINUNFRC -- -- -- -- -- --  CREATININE -- 0.92 0.95 -- -- --  CKTOTAL -- -- -- -- -- --  CKMB -- -- -- -- -- --  TROPONINI -- -- -- <0.30 <0.30 <0.30    Estimated Creatinine Clearance: 79 ml/min (by C-G formula based on Cr of 0.92).   Medications:  Scheduled:     .  ceFAZolin (ANCEF) IV  2 g Intravenous Q6H  . docusate sodium  100 mg Oral BID  . enoxaparin (LOVENOX) injection  30 mg Subcutaneous Q12H  . lisinopril  5 mg Oral Daily  . metoCLOPramide (REGLAN) injection  10 mg Intravenous Q6H  . metoprolol  10 mg Intravenous Q6H  . warfarin  12.5 mg Intravenous ONCE-1800  . Warfarin - Pharmacist Dosing Inpatient   Does not apply q1800    Assessment: Pt is a 87 YOM s/p elective L TKA on warfarin and lovenox 30mg   q12h. INR on 9/15 is 1.86 (from 1.84 on 9/14). Warfarin was held on 9/12 and re-started as IV on 9/13 d/t post op ileus making patient NPO. H/H 9.1/26.8 (from 9.2/25.5), Plts 294, no evidence of bleeding reported. INR therapeutic today. Ok to d/c lovenox and attempt to put patient on home coumadin regimen. Patient on clear liquid diet. Will give coumadin po.   Goal of Therapy:  INR 2-3 Monitor platelets by anticoagulation protocol:  Yes   Plan:  Coumadin 10mg  today, d/c lovenox and f/u daily protime.  Verlene Mayer, PharmD, BCPS Pager [phone] 07/27/2012 9:09 AM

## 2012-07-28 LAB — PROTIME-INR
INR: 2.09 — ABNORMAL HIGH (ref 0.00–1.49)
Prothrombin Time: 23.8 seconds — ABNORMAL HIGH (ref 11.6–15.2)

## 2012-07-28 NOTE — Progress Notes (Signed)
[facility] Progress Note  Subjective:  Doing well.  Tolerating soft diet all day yesterday and this AM.  BM this morning.  No abdominal pain.  Abdominal distention improving.  Objective:  Vital signs in last 24 hours: Temp:  [97.9 F (36.6 C)-98.8 F (37.1 C)] 98.7 F (37.1 C) (09/17 0619) Pulse Rate:  [88-112] 88  (09/17 0619) Resp:  [16-18] 18  (09/17 0619) BP: (116-124)/(67-77) 123/77 mmHg (09/17 0619) SpO2:  [95 %-100 %] 98 % (09/17 0619) Last BM Date: 07/27/12 General:  Alert, Well-developed, in NAD Heart:  Regular rate and rhythm; no murmurs Pulm:  CTAB. No W/R/R. Abdomen:  Soft, minimally distended. Normal bowel sounds.  Nontender.   Neurologic:  Alert and oriented x4;  grossly normal neurologically. Psych:  Alert and cooperative. Normal mood and affect.  Intake/Output from previous day: 09/16 0701 - 09/17 0700 In: 1800 [I.V.:1800] Out: -   PT/INR  Basename 07/28/12 0615 07/27/12 0700  LABPROT 23.8* 25.0*  INR 2.09* 2.22*   Dg Abd 1 View  07/27/2012  *RADIOLOGY REPORT*  Clinical Data: Postop ileus  ABDOMEN - 1 VIEW  Comparison: Abdomen film of 07/24/2012  Findings: Both large and small bowel gas remains most consistent with ileus.  Gaseous distention of the cecum has increased somewhat measuring approximately 14.3 cm in transverse diameter.  IMPRESSION: Increasing gaseous distention of the cecum.  Otherwise little change in apparent ileus pattern.   Original Report Authenticated By: Juline Patch, M.D.     Assessment / Plan: -Post-op ileus-clinically looks very well.  I imagine that he will be discharged home today.  No further input from a GI standpoint.  -S/P left TKR 8 days ago    LOS: 8 days   ZEHR, JESSICA D.  07/28/2012, 8:45 AM  Pager number [phone]  GI ATTENDING  Seen and examined. Agree with above. Ready for discharge.  Wilhemina Bonito. Eda Keys., M.D. [facility] Division of Gastroenterology

## 2012-07-28 NOTE — Progress Notes (Signed)
Utilization review completed. Brittain Smithey, RN, BSN. 

## 2012-07-28 NOTE — Discharge Summary (Signed)
Patient ID: [name] MRN: [id number] DOB/AGE: [birth year] 67 y.o.  Admit date: 07/20/2012 Discharge date: 07/28/2012  Admission Diagnoses:  CAD, NATIVE VESSEL  Aortic valve disorders  Atrial fibrillation  Osteoarthritis  Encounter for long-term (current) use of anticoagulants  Left knee DJD   Discharge Diagnoses:  CAD, NATIVE VESSEL  Aortic valve disorders  Atrial fibrillation  Osteoarthritis  Encounter for long-term (current) use of anticoagulants  Left knee DJD  Upper respiratory infection  Ileus, postoperative Acute postoperative blood loss anemia  Past Medical History  Diagnosis Date  . Myocardial infarction   . Aortic stenosis   . Gout   . History of pneumonia   . Diverticulosis   . CAD (coronary artery disease)   . Left knee DJD   . Ventricular fibrillation   . Atrial fibrillation or flutter   . COPD (chronic obstructive pulmonary disease)     bronchitis HX    Surgeries: Procedure(s): TOTAL KNEE ARTHROPLASTY on 07/20/2012   Consultants: Treatment Team:  Hilarie Fredrickson, MD  Discharged Condition: Improved  Hospital Course: PATRICKJAMES HACKER is an 66 y.o. male who was admitted 07/20/2012 for operative treatment ofLeft knee DJD. Patient has severe unremitting pain that affects sleep, daily activities, and work/hobbies. After pre-op clearance the patient was taken to the operating room on 07/20/2012 and underwent  Procedure(s): TOTAL KNEE ARTHROPLASTY.    Patient was given perioperative antibiotics:     Anti-infectives     Start     Dose/Rate Route Frequency Ordered Stop   07/27/12 0000   cephALEXin (KEFLEX) 500 MG capsule        500 mg Oral Every 6 hours 07/27/12 0743 08/06/12 2359   07/23/12 1900   ceFAZolin (ANCEF) IVPB 2 g/50 mL premix        2 g 100 mL/hr over 30 Minutes Intravenous 4 times per day 07/23/12 1802     07/22/12 1000   cephALEXin (KEFLEX) capsule 500 mg  Status:  Discontinued        500 mg Oral 4 times daily 07/22/12 0811 07/23/12 1802   07/22/12 0000   cephALEXin (KEFLEX) 500 MG capsule  Status:  Discontinued        500 mg Oral Every 6 hours 07/22/12 0817 07/27/12    07/20/12 1700   ceFAZolin (ANCEF) IVPB 2 g/50 mL premix        2 g 100 mL/hr over 30 Minutes Intravenous Every 6 hours 07/20/12 1550 07/20/12 2336   07/20/12 1209   cefUROXime (ZINACEF) injection  Status:  Discontinued          As needed 07/20/12 1210 07/20/12 1251   07/19/12 1502   ceFAZolin (ANCEF) IVPB 2 g/50 mL premix        2 g 100 mL/hr over 30 Minutes Intravenous 60 min pre-op 07/19/12 1502 07/20/12 1057           Patient was given sequential compression devices, early ambulation, and chemoprophylaxis to prevent DVT.  Post op day 2.  Patient had hypoactive bowel sounds and a large distended abdomen.  Dulcolax suppository and 1/2 bottle of mag citrate were given with liquid stool results.  KUB showed an ileus.  GI was consulted on Thursday because the ileus would not resolve with laxitives, Reglan, and suppositories.  Relistor was ordered.  Patient passed gas but still had nausea.  NG was placed due to significant nausea and vomitting.  Patient had multiple suppositories over the weekend and NG tube drainage was decreasing.  NG  tube was removed on Sunday.  Monday abdomen was still distended so GI was once again called due to persistant ileus on Xray.  Fleets enema times 2 gave a solid bowel movement times one.  He then had another liquid bowel movement.  Today,  Patient feels better.  Abdomen is still distended but no pain or nausea.  Bowel sounds are present.  Patient is given 1/2 bottle of mag citrate with the plan on discharge this afternoon.  Patient benefited maximally from hospital stay.   Recent vital signs:  Patient Vitals for the past 24 hrs:  BP Temp Temp src Pulse Resp SpO2  07/28/12 0619 123/77 mmHg 98.7 F (37.1 C) Oral 88  18  98 %  07/28/12 0447 - - - - 18  100 %  07/28/12 0018 - - - - 18  98 %  07/27/12 2104 124/71 mmHg 98.8 F  (37.1 C) - 100  16  100 %  07/27/12 2000 - - - - 18  95 %  07/27/12 1600 - - - - 16  -  07/27/12 1400 116/67 mmHg 97.9 F (36.6 C) - 112  18  100 %  07/27/12 1200 - - - - 16  -     Recent laboratory studies:   Basename 07/28/12 0615 07/27/12 0700 07/26/12 0435  WBC -- -- 6.2  HGB -- -- 9.1*  HCT -- -- 26.8*  PLT -- -- 294  NA -- -- 138  K -- -- 3.6  CL -- -- 99  CO2 -- -- 29  BUN -- -- 18  CREATININE -- -- 0.92  GLUCOSE -- -- 83  INR 2.09* 2.22* --  CALCIUM -- -- 9.1     Discharge Medications:     Medication List     As of 07/28/2012  8:03 AM    STOP taking these medications         enoxaparin 120 MG/0.8ML injection   Commonly known as: LOVENOX      TAKE these medications         acetaminophen 500 MG tablet   Commonly known as: TYLENOL   Take 500 mg by mouth every 6 (six) hours as needed. For knee pain.      bisacodyl 5 MG EC tablet   Commonly known as: DULCOLAX   Take 2 tablets (10 mg total) by mouth daily as needed for constipation.      cephALEXin 500 MG capsule   Commonly known as: KEFLEX   Take 1 capsule (500 mg total) by mouth every 6 (six) hours.      COLCRYS 0.6 MG tablet   Generic drug: colchicine   Take 1 tablet by mouth daily as needed. For gout flare up      DSS 100 MG Caps   Take 100 mg by mouth 2 (two) times daily.      lisinopril 5 MG tablet   Commonly known as: PRINIVIL,ZESTRIL   Take 1 tablet (5 mg total) by mouth daily.      metoprolol succinate 50 MG 24 hr tablet   Commonly known as: TOPROL-XL   Take 1 tablet (50 mg total) by mouth 2 (two) times daily.      warfarin 5 MG tablet   Commonly known as: COUMADIN   Take 10 mg by mouth daily.         Diagnostic Studies: Dg Chest 2 View  07/22/2012  *RADIOLOGY REPORT*  Clinical Data: Cough and congestion.  Evaluate for atelectasis or pneumonia.  CHEST - 2 VIEW  Comparison: Chest x-ray 07/14/2012.  Findings: Lung volumes are normal.  No consolidative airspace disease.  Minimal  blunting of the left costophrenic sulcus noted only on the lateral view may represent some chronic pleural scarring or a trace left-sided pleural effusion.  No right pleural effusion.  Pulmonary vasculature is normal.  Heart size is within normal limits.  Mediastinal contours are unremarkable. Atherosclerosis in the thoracic aorta.  Status post median sternotomy for aortic valve replacement (a stented bioprosthesis valve is noted).  IMPRESSION: 1.  Possible trace left pleural effusion versus left pleural scarring. 2.  No other radiographic evidence of acute cardiopulmonary disease. 3.  Atherosclerosis. 4.  Status post median sternotomy for aortic valve replacement.   Original Report Authenticated By: Florencia Reasons, M.D.    Dg Chest 2 View  07/14/2012  *RADIOLOGY REPORT*  Clinical Data: Preop for total knee replacement surgery.  CHEST - 2 VIEW  Comparison: 11/19/2011.  Findings: The cardiac silhouette, mediastinal and hilar contours are within normal limits and stable.  There is slightly tortuous thoracic aorta.  Stable surgical changes related to aortic valve placement surgery.  The lungs are clear.  No pleural effusion or pulmonary edema. Bilateral nipple shadows are noted.  The bony thorax is intact. Stable degenerative changes involving the thoracic spine.  IMPRESSION:  No acute cardiopulmonary findings.   Original Report Authenticated By: P. Loralie Champagne, M.D.    Dg Cervical Spine Complete  07/15/2012  *RADIOLOGY REPORT*  Clinical Data: Neck pain.  CERVICAL SPINE - COMPLETE 4+ VIEW  Comparison: None  Findings: Mild degenerative cervical spondylosis with disc disease and facet disease most notable at C5-6 and C6-7.  There is mild degenerative anterior subluxation of C7 compared with T1 likely due to facet disease.  Moderate uncinate spurring changes are noted at C5-6 and C6-7 bilaterally contributing to bony foraminal narrowing. No acute bony findings or abnormal prevertebral soft tissue swelling.  The  C1-2 articulations are maintained.  The lung apices are clear.  IMPRESSION:  1.  Degenerative cervical spondylosis with disc disease and facet disease most notable in the lower cervical spine. 2.  Degenerative anterior subluxation of C7 compared with T1. 3.  Bilateral bony foraminal narrowing at C5-6 and C6-7.   Original Report Authenticated By: P. Loralie Champagne, M.D.    Dg Abd 1 View  07/27/2012  *RADIOLOGY REPORT*  Clinical Data: Postop ileus  ABDOMEN - 1 VIEW  Comparison: Abdomen film of 07/24/2012  Findings: Both large and small bowel gas remains most consistent with ileus.  Gaseous distention of the cecum has increased somewhat measuring approximately 14.3 cm in transverse diameter.  IMPRESSION: Increasing gaseous distention of the cecum.  Otherwise little change in apparent ileus pattern.   Original Report Authenticated By: Juline Patch, M.D.    Dg Abd 1 View  07/24/2012  *RADIOLOGY REPORT*  Clinical Data: Ileus, abdominal pain, constipation  ABDOMEN - 1 VIEW  Comparison: CT of the abdomen pelvis of 07/23/2012  Findings: A supine film of the abdomen shows both large and small bowel gas to be present most consistent with ileus.  An NG tube is noted with the tip in the region of the distal antrum.  No opaque calculi is seen.  Some contrast is present within the urinary bladder.  IMPRESSION: Gaseous distention of large and small bowel most consistent with diffuse ileus.   Original Report Authenticated By: Juline Patch, M.D.    Dg Abd 1 View  07/23/2012  *RADIOLOGY  REPORT*  Clinical Data: Postop ileus, abdominal distention  ABDOMEN - 1 VIEW  Comparison: Plain film abdomen 07/22/2012, CT 03/15/2009  Findings: Again demonstrated gas filled loops of large and small bowel.  These are not improved in the interval.  Therei s gas and stool in the rectum.  The cecum is elevated in the central abdomen.  IMPRESSION: Progressive gas distention of the large and small bowel.   Original Report Authenticated By:  Genevive Bi, M.D.    Dg Abd 1 View  07/22/2012  *RADIOLOGY REPORT*  Clinical Data: Constipation.  Vomiting.  Rule out ileus.  ABDOMEN - 1 VIEW  Comparison: No priors.  Findings: Gas and stool are seen throughout the colon extending to the level of the distal rectum.  Multiple mildly dilated loops of gas-filled small bowel are seen in the abdomen, measuring up to approximately 3.3 cm in diameter.  No gross evidence of pneumoperitoneum.  IMPRESSION: 1.  Bowel gas pattern is nonspecific, but favored to represent an ileus, as above.   Original Report Authenticated By: Florencia Reasons, M.D.    Ct Abdomen Pelvis W Contrast  07/23/2012  *RADIOLOGY REPORT*  Clinical Data: Increasing abdominal pain and distention after knee surgery.  CT ABDOMEN AND PELVIS WITH CONTRAST  Technique:  Multidetector CT imaging of the abdomen and pelvis was performed following the standard protocol during bolus administration of intravenous contrast.  Contrast: OMNIPAQUE IOHEXOL 300 MG/ML  SOLN  Comparison: None.  Findings: Mild atelectatic change left lung base.  No significant effusion or visible pneumothorax.  Cardiomegaly.  No hepatic defects or dilated ducts. Tiny hepatic granuloma. Normal gallbladder.  The spleen is unremarkable.  Normal pancreas, adrenal glands, and kidneys.  Moderately distended fluid-filled loops of small bowel with air- fluid levels.  Moderately distended colon with air-fluid levels. No evidence for volvulus.  Transverse diameter the cecum 10.5 cm as measured on coronal image 28.  Decompressed rectosigmoid region without visible mass or fixed transition point.  Sigmoid diverticulosis without diverticulitis.  Unremarkable bladder, prostate, seminal vesicles.  No free fluid or free air.  Mild vascular calcification without aneurysmal dilatation.  Moderate degenerative change lumbar spine without worrisome osseous lesions in the spine or pelvis.  IMPRESSION: Findings consistent with postoperative ileus.   No free fluid or free air.   Original Report Authenticated By: Elsie Stain, M.D.     Disposition: 06-Home-Health Care Svc  Discharge Orders    Future Appointments: Provider: Department: Dept Phone: Center:   06/14/2013 8:15 AM Sherrie George, MD [facility] [phone] None     Future Orders Please Complete By Expires   Diet - low sodium heart healthy      Call MD / Call 911      Comments:   If you experience chest pain or shortness of breath, CALL 911 and be transported to the hospital emergency room.  If you develope a fever above 101 F, pus (white drainage) or increased drainage or redness at the wound, or calf pain, call your surgeon's office.   Constipation Prevention      Comments:   Drink plenty of fluids.  Prune juice may be helpful.  You may use a stool softener, such as Colace (over the counter) 100 mg twice a day.  Use MiraLax (over the counter) for constipation as needed.   Increase activity slowly as tolerated      Discharge instructions      Comments:   Total Knee Replacement Care After Refer to this  sheet in the next few weeks. These discharge instructions provide you with general information on caring for yourself after you leave the hospital. Your caregiver may also give you specific instructions. Your treatment has been planned according to the most current medical practices available, but unavoidable complications sometimes occur. If you have any problems or questions after discharge, please call your caregiver. Regaining a near full range of motion of your knee within the first 3 to 6 weeks after surgery is critical. HOME CARE INSTRUCTIONS  You may resume a normal diet and activities as directed.  Perform exercises as directed.  Place yellow foam block, yellow side up under heel at all times except when in CPM or when walking.  DO NOT modify, tear, cut, or change in any way. You will receive physical therapy daily  Take showers instead of baths until  informed otherwise.  Change bandages (dressings)daily Do not take over-the-counter or prescription medicines for pain, discomfort, or fever. Eat a well-balanced diet.  Avoid lifting or driving until you are instructed otherwise.  Make an appointment to see your caregiver for stitches (suture) or staple removal as directed.  If you have been sent home with a continuous passive motion machine (CPM machine), 0-90 degrees 6 hrs a day   2 hrs a shift SEEK MEDICAL CARE IF: You have swelling of your calf or leg.  You develop shortness of breath or chest pain.  You have redness, swelling, or increasing pain in the wound.  There is pus or any unusual drainage coming from the surgical site.  You notice a bad smell coming from the surgical site or dressing.  The surgical site breaks open after sutures or staples have been removed.  There is persistent bleeding from the suture or staple line.  You are getting worse or are not improving.  You have any other questions or concerns.  SEEK IMMEDIATE MEDICAL CARE IF:  You have a fever.  You develop a rash.  You have difficulty breathing.  You develop any reaction or side effects to medicines given.  Your knee motion is decreasing rather than improving.  MAKE SURE YOU:  Understand these instructions.  Will watch your condition.  Will get help right away if you are not doing well or get worse.   CPM      Comments:   Continuous passive motion machine (CPM):      Use the CPM from 0 to 90 for 6 hours per day.       You may break it up into 2 or 3 sessions per day.      Use CPM for 2 weeks or until you are told to stop.   TED hose      Comments:   Use stockings (TED hose) for 2 weeks on both leg(s).  You may remove them at night for sleeping.   Change dressing      Comments:   Change the dressing daily with sterile 4 x 4 inch gauze dressing and apply TED hose.  You may clean the incision with alcohol prior to redressing.   Do not put a pillow under  the knee. Place it under the heel.      Comments:   Place yellow foam block, yellow side up under heel at all times except when in CPM or when walking.  DO NOT modify, tear, cut, or change in any way the yellow foam block.      Follow-up Information    Follow up with Salvatore Marvel  A, MD. On 08/03/2012. (appt 2 pm)    Contact information:   [facility] [address] [place] [address] [phone]           Signed: Pascal Lux 07/28/2012, 8:03 AM

## 2012-07-28 NOTE — Progress Notes (Signed)
Patient was discharged in stable condition via wheelchair. Discharge instructions and prescriptions were given and explained.

## 2012-07-28 NOTE — Progress Notes (Signed)
Physical Therapy Treatment Patient Details Name: [name] MRN: [id number] DOB: October 17, 1946 Today's Date: 07/28/2012 Time: 5621-3086 PT Time Calculation (min): 23 min  PT Assessment / Plan / Recommendation Comments on Treatment Session  Pt admitted s/p left TKA with post-op ileus.  Pt progressing well and is ready for safe d/c home once medically cleared by MD.    Follow Up Recommendations  Home health PT    Barriers to Discharge        Equipment Recommendations  None recommended by PT    Recommendations for Other Services    Frequency 7X/week   Plan Discharge plan remains appropriate;Frequency remains appropriate    Precautions / Restrictions Precautions Precautions: Knee Precaution Booklet Issued: No Restrictions Weight Bearing Restrictions: Yes LLE Weight Bearing: Weight bearing as tolerated   Pertinent Vitals/Pain None    Mobility  Bed Mobility Bed Mobility: Sit to Supine Sit to Supine: 4: Min assist;HOB flat Details for Bed Mobility Assistance: Assist for left LE due to weakness.  Cues for sequence. Transfers Transfers: Sit to Stand;Stand to Sit Sit to Stand: 6: Modified independent (Device/Increase time);With upper extremity assist;From bed Stand to Sit: 6: Modified independent (Device/Increase time);With upper extremity assist;To bed Ambulation/Gait Ambulation/Gait Assistance: 5: Supervision Ambulation Distance (Feet): 200 Feet Assistive device: Rolling walker Ambulation/Gait Assistance Details: Verbal cues for sequence for step-through. Gait Pattern: Step-to pattern;Decreased step length - left;Decreased stance time - left Stairs: Yes Stairs Assistance: 5: Supervision Stairs Assistance Details (indicate cue type and reason): Supervision for safety. Stair Management Technique: One rail Left;Step to pattern;Sideways Number of Stairs: 2  Wheelchair Mobility Wheelchair Mobility: No    Exercises Total Joint Exercises Long Arc Quad: AROM;Left;10  reps;Seated Knee Flexion: AROM;Left;10 reps;Seated Goniometric ROM: A/ROM left knee 0-100 degrees.   PT Diagnosis:    PT Problem List:   PT Treatment Interventions:     PT Goals Acute Rehab PT Goals PT Goal Formulation: With patient Time For Goal Achievement: 07/26/12 Potential to Achieve Goals: Good PT Goal: Ambulate - Progress: Progressing toward goal PT Goal: Up/Down Stairs - Progress: Progressing toward goal PT Goal: Perform Home Exercise Program - Progress: Progressing toward goal  Visit Information  Last PT Received On: 07/28/12 Assistance Needed: +1    Subjective Data  Subjective: "I get to go home today!" Patient Stated Goal: to go home   Cognition  Overall Cognitive Status: Appears within functional limits for tasks assessed/performed Arousal/Alertness: Awake/alert Orientation Level: Appears intact for tasks assessed Behavior During Session: [facility] for tasks performed    Balance  Balance Balance Assessed: No  End of Session PT - End of Session Equipment Utilized During Treatment: Gait belt Activity Tolerance: Patient tolerated treatment well Patient left: in bed;with call bell/phone within reach Nurse Communication: Mobility status CPM Left Knee CPM Left Knee: Off   GP     Cephus Shelling 07/28/2012, 10:39 AM  07/28/2012 Cephus Shelling, PT, DPT [phone]

## 2012-07-29 ENCOUNTER — Telehealth: Payer: Self-pay | Admitting: *Deleted

## 2012-07-29 ENCOUNTER — Ambulatory Visit (INDEPENDENT_AMBULATORY_CARE_PROVIDER_SITE_OTHER): Payer: Medicare Other | Admitting: *Deleted

## 2012-07-29 DIAGNOSIS — I359 Nonrheumatic aortic valve disorder, unspecified: Secondary | ICD-10-CM

## 2012-07-29 DIAGNOSIS — I4891 Unspecified atrial fibrillation: Secondary | ICD-10-CM

## 2012-07-29 DIAGNOSIS — Z7901 Long term (current) use of anticoagulants: Secondary | ICD-10-CM

## 2012-07-29 LAB — POCT INR: INR: 1.8

## 2012-07-29 NOTE — Telephone Encounter (Signed)
INR - 1.2 and PT - 21.2 / states that patient did not take Coumadin or Lovenox on 9/17 and will start back taking both today / please call with instructions / tg

## 2012-07-29 NOTE — Telephone Encounter (Signed)
Spoke with [facility].  Pt INR was 1.8 not 1.2.  See coumadin note for orders.

## 2012-07-30 ENCOUNTER — Ambulatory Visit (INDEPENDENT_AMBULATORY_CARE_PROVIDER_SITE_OTHER): Payer: Medicare Other | Admitting: *Deleted

## 2012-07-30 DIAGNOSIS — I4891 Unspecified atrial fibrillation: Secondary | ICD-10-CM

## 2012-07-30 DIAGNOSIS — Z7901 Long term (current) use of anticoagulants: Secondary | ICD-10-CM

## 2012-07-30 DIAGNOSIS — I359 Nonrheumatic aortic valve disorder, unspecified: Secondary | ICD-10-CM

## 2012-07-30 LAB — POCT INR: INR: 1.5

## 2012-08-03 ENCOUNTER — Ambulatory Visit (INDEPENDENT_AMBULATORY_CARE_PROVIDER_SITE_OTHER): Payer: Medicare Other | Admitting: *Deleted

## 2012-08-03 DIAGNOSIS — I359 Nonrheumatic aortic valve disorder, unspecified: Secondary | ICD-10-CM

## 2012-08-03 DIAGNOSIS — I4891 Unspecified atrial fibrillation: Secondary | ICD-10-CM

## 2012-08-03 DIAGNOSIS — Z7901 Long term (current) use of anticoagulants: Secondary | ICD-10-CM

## 2012-08-06 ENCOUNTER — Ambulatory Visit (INDEPENDENT_AMBULATORY_CARE_PROVIDER_SITE_OTHER): Payer: Medicare Other | Admitting: *Deleted

## 2012-08-06 DIAGNOSIS — Z7901 Long term (current) use of anticoagulants: Secondary | ICD-10-CM

## 2012-08-06 DIAGNOSIS — I359 Nonrheumatic aortic valve disorder, unspecified: Secondary | ICD-10-CM

## 2012-08-06 DIAGNOSIS — I4891 Unspecified atrial fibrillation: Secondary | ICD-10-CM

## 2012-08-13 ENCOUNTER — Ambulatory Visit (INDEPENDENT_AMBULATORY_CARE_PROVIDER_SITE_OTHER): Payer: Medicare Other | Admitting: *Deleted

## 2012-08-13 DIAGNOSIS — I359 Nonrheumatic aortic valve disorder, unspecified: Secondary | ICD-10-CM

## 2012-08-13 DIAGNOSIS — Z7901 Long term (current) use of anticoagulants: Secondary | ICD-10-CM

## 2012-08-13 DIAGNOSIS — I4891 Unspecified atrial fibrillation: Secondary | ICD-10-CM

## 2012-08-20 ENCOUNTER — Ambulatory Visit (INDEPENDENT_AMBULATORY_CARE_PROVIDER_SITE_OTHER): Payer: Medicare Other | Admitting: *Deleted

## 2012-08-20 DIAGNOSIS — I4891 Unspecified atrial fibrillation: Secondary | ICD-10-CM

## 2012-08-20 DIAGNOSIS — I359 Nonrheumatic aortic valve disorder, unspecified: Secondary | ICD-10-CM

## 2012-08-20 DIAGNOSIS — Z7901 Long term (current) use of anticoagulants: Secondary | ICD-10-CM

## 2012-09-03 ENCOUNTER — Ambulatory Visit (INDEPENDENT_AMBULATORY_CARE_PROVIDER_SITE_OTHER): Payer: Medicare Other | Admitting: *Deleted

## 2012-09-03 DIAGNOSIS — I4891 Unspecified atrial fibrillation: Secondary | ICD-10-CM

## 2012-09-03 DIAGNOSIS — Z7901 Long term (current) use of anticoagulants: Secondary | ICD-10-CM

## 2012-09-03 DIAGNOSIS — I359 Nonrheumatic aortic valve disorder, unspecified: Secondary | ICD-10-CM

## 2012-09-21 ENCOUNTER — Ambulatory Visit (INDEPENDENT_AMBULATORY_CARE_PROVIDER_SITE_OTHER): Payer: Medicare Other | Admitting: *Deleted

## 2012-09-21 DIAGNOSIS — Z7901 Long term (current) use of anticoagulants: Secondary | ICD-10-CM

## 2012-09-21 DIAGNOSIS — I4891 Unspecified atrial fibrillation: Secondary | ICD-10-CM

## 2012-09-21 DIAGNOSIS — I359 Nonrheumatic aortic valve disorder, unspecified: Secondary | ICD-10-CM

## 2012-09-21 LAB — POCT INR: INR: 3

## 2012-10-19 ENCOUNTER — Ambulatory Visit (INDEPENDENT_AMBULATORY_CARE_PROVIDER_SITE_OTHER): Payer: Medicare Other | Admitting: *Deleted

## 2012-10-19 DIAGNOSIS — I359 Nonrheumatic aortic valve disorder, unspecified: Secondary | ICD-10-CM

## 2012-10-19 DIAGNOSIS — Z7901 Long term (current) use of anticoagulants: Secondary | ICD-10-CM

## 2012-10-19 DIAGNOSIS — I4891 Unspecified atrial fibrillation: Secondary | ICD-10-CM

## 2012-10-19 LAB — POCT INR: INR: 5.7

## 2012-10-26 ENCOUNTER — Ambulatory Visit (INDEPENDENT_AMBULATORY_CARE_PROVIDER_SITE_OTHER): Payer: Medicare Other | Admitting: *Deleted

## 2012-10-26 DIAGNOSIS — I359 Nonrheumatic aortic valve disorder, unspecified: Secondary | ICD-10-CM

## 2012-10-26 DIAGNOSIS — I4891 Unspecified atrial fibrillation: Secondary | ICD-10-CM

## 2012-10-26 DIAGNOSIS — Z7901 Long term (current) use of anticoagulants: Secondary | ICD-10-CM

## 2012-10-26 LAB — POCT INR: INR: 2.5

## 2012-11-07 ENCOUNTER — Other Ambulatory Visit: Payer: Self-pay | Admitting: Cardiology

## 2012-11-09 ENCOUNTER — Ambulatory Visit (INDEPENDENT_AMBULATORY_CARE_PROVIDER_SITE_OTHER): Payer: Medicare Other | Admitting: *Deleted

## 2012-11-09 DIAGNOSIS — I4891 Unspecified atrial fibrillation: Secondary | ICD-10-CM

## 2012-11-09 DIAGNOSIS — Z7901 Long term (current) use of anticoagulants: Secondary | ICD-10-CM

## 2012-11-09 DIAGNOSIS — I359 Nonrheumatic aortic valve disorder, unspecified: Secondary | ICD-10-CM

## 2012-11-09 LAB — POCT INR: INR: 3.2

## 2012-11-30 ENCOUNTER — Ambulatory Visit (INDEPENDENT_AMBULATORY_CARE_PROVIDER_SITE_OTHER): Payer: Medicare Other | Admitting: *Deleted

## 2012-11-30 DIAGNOSIS — Z7901 Long term (current) use of anticoagulants: Secondary | ICD-10-CM

## 2012-11-30 DIAGNOSIS — I359 Nonrheumatic aortic valve disorder, unspecified: Secondary | ICD-10-CM

## 2012-11-30 DIAGNOSIS — I4891 Unspecified atrial fibrillation: Secondary | ICD-10-CM

## 2012-11-30 LAB — POCT INR: INR: 2.6

## 2012-12-07 ENCOUNTER — Encounter: Payer: Self-pay | Admitting: Nurse Practitioner

## 2012-12-07 ENCOUNTER — Ambulatory Visit (INDEPENDENT_AMBULATORY_CARE_PROVIDER_SITE_OTHER): Payer: Medicare Other | Admitting: Nurse Practitioner

## 2012-12-07 ENCOUNTER — Other Ambulatory Visit (INDEPENDENT_AMBULATORY_CARE_PROVIDER_SITE_OTHER): Payer: Medicare Other

## 2012-12-07 ENCOUNTER — Ambulatory Visit: Payer: Medicare Other | Admitting: Cardiology

## 2012-12-07 VITALS — BP 118/62 | HR 72 | Ht 70.5 in | Wt 186.0 lb

## 2012-12-07 DIAGNOSIS — I350 Nonrheumatic aortic (valve) stenosis: Secondary | ICD-10-CM

## 2012-12-07 DIAGNOSIS — I10 Essential (primary) hypertension: Secondary | ICD-10-CM

## 2012-12-07 DIAGNOSIS — Z952 Presence of prosthetic heart valve: Secondary | ICD-10-CM

## 2012-12-07 DIAGNOSIS — I359 Nonrheumatic aortic valve disorder, unspecified: Secondary | ICD-10-CM

## 2012-12-07 DIAGNOSIS — Z954 Presence of other heart-valve replacement: Secondary | ICD-10-CM

## 2012-12-07 DIAGNOSIS — N529 Male erectile dysfunction, unspecified: Secondary | ICD-10-CM

## 2012-12-07 DIAGNOSIS — I2510 Atherosclerotic heart disease of native coronary artery without angina pectoris: Secondary | ICD-10-CM

## 2012-12-07 DIAGNOSIS — I4891 Unspecified atrial fibrillation: Secondary | ICD-10-CM

## 2012-12-07 LAB — LIPID PANEL
Cholesterol: 140 mg/dL (ref 0–200)
VLDL: 9.4 mg/dL (ref 0.0–40.0)

## 2012-12-07 LAB — BASIC METABOLIC PANEL
BUN: 21 mg/dL (ref 6–23)
Chloride: 103 mEq/L (ref 96–112)
GFR: 81 mL/min (ref >60.00)
Glucose, Bld: 89 mg/dL (ref 70–99)
Potassium: 4.5 mEq/L (ref 3.5–5.1)
Sodium: 137 mEq/L (ref 135–145)

## 2012-12-07 MED ORDER — SILDENAFIL CITRATE 25 MG PO TABS: 25.0000 mg | ORAL_TABLET | Freq: Every day | ORAL | Status: DC | PRN

## 2012-12-07 NOTE — Patient Instructions (Addendum)
Your physician wants you to follow-up in: 6 months with Dr Shirlee Latch.   You will receive a reminder letter in the mail two months in advance. If you don't receive a letter, please call our office to schedule the follow-up appointment.  Your physician has recommended you make the following change in your medication: START Viagra once daily as needed, otherwise, continue on your current medications as directed. Please refer to the Current Medication list given to you today.

## 2012-12-07 NOTE — Progress Notes (Signed)
Patient Name: [name] Date of Encounter: 12/07/2012  Primary Care Provider:  Alice Reichert, MD Primary Cardiologist:  Golden Circle, MD  Patient Profile  67 year old male with history of atrial fibrillation, bioprosthetic aVR, and thrombotic MI, who presents for followup.  Problem List   Past Medical History  Diagnosis Date  . Myocardial infarction   . Aortic stenosis     a. s/p bioprosth AVR 04/2010;  b. 05/2012 Echo: EF 50-55%, nl bioprosth fxn w/ gradient, mold dil LA.  Marland Kitchen Gout   . History of pneumonia   . Diverticulosis   . CAD (coronary artery disease)     a. s/p MI/VF arrest 04/2010 w/ cath showing OM1 and LPDA a occlusions 2/2 thrombus in.  Cors otw nonobs.  . DJD (degenerative joint disease)     a. s/p R TKA 2010;  b. s/p L TKA 2013.  Marland Kitchen Ventricular fibrillation     a. VR arrest in setting of thrombotic MI 04/2010.  . Atrial fibrillation or flutter     a. s/p Cox-Maze @ time of AVR;  b. Previously on Amio and s/p DCCV x 2 ->failed->now rate controlled and on chronic coumadin.  Marland Kitchen COPD (chronic obstructive pulmonary disease)     bronchitis HX  . Thrombus of left atrial appendage Hx of -     a. 04/2010   Past Surgical History  Procedure Date  . Right ankle pinning     Fell from tree w/fracture several years ago.  . 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 Retinal laser procedure     right eye   . Total knee arthroplasty 07/20/2012    Procedure: TOTAL KNEE ARTHROPLASTY;  Surgeon: Nilda Simmer, MD;  Location: [facility] OR;  Service: Orthopedics;  Laterality: Left;  left total knee arthroplasty    Allergies  Allergies  Allergen Reactions  . Diclofenac Itching  . Naproxen     itching    HPI  67 year old male the above problem list.  He was last seen in clinic over the summer at which time he underwent echocardiogram showing normal LV function with stable bioprosthetic aortic valve  function.  Following that visit, he underwent left total knee arthroplasty, which he tolerated well.  He remains quite active, playing golf when the weather is warm and of more riding his stationary bike.  He denies chest pain, dyspnea exertion, PND, orthopnea, dizziness, syncope, edema, or early satiety.  He is chronically in atrial fibrillation and this has been well rate controlled.  Home Medications  Prior to Admission medications   Medication Sig Start Date End Date Taking? Authorizing Provider  acetaminophen (TYLENOL) 500 MG tablet Take 500 mg by mouth every 6 (six) hours as needed. For knee pain.   Yes Historical Provider, MD  amoxicillin (AMOXIL) 500 MG capsule Take 500 mg by mouth as needed. Takes prior to dental visits   Yes Historical Provider, MD  COLCRYS 0.6 MG tablet Take 1 tablet by mouth daily as needed. For gout flare up 04/30/12  Yes Historical Provider, MD  lisinopril (PRINIVIL,ZESTRIL) 5 MG tablet Take 1 tablet (5 mg total) by mouth daily. 07/06/12 07/06/13 Yes Laurey Morale, MD  metoprolol succinate (TOPROL-XL) 50 MG 24 hr tablet Take 1 tablet (50 mg total) by mouth 2 (two) times daily. 06/08/12  Yes Laurey Morale, MD  warfarin (COUMADIN) 5 MG tablet Take as directed by coumadin clinic 11/09/12  Yes Dalton  Alford Highland, MD  sildenafil (VIAGRA) 25 MG tablet Take 1 tablet (25 mg total) by mouth daily as needed for erectile dysfunction. 12/07/12   Ok Anis, NP   Review of Systems  C/o erectile dysfxn.  Otherwise doing well.  He denies chest pain, palpitations, dyspnea, pnd, orthopnea, n, v, dizziness, syncope, edema, weight gain, or early satiety. All other systems reviewed and are otherwise negative except as noted above.  Physical Exam  Blood pressure 118/62, pulse 72, height 5' 10.5" (1.791 m), weight 186 lb (84.369 kg).  General: Pleasant, NAD Psych: Normal affect. Neuro: Alert and oriented X 3. Moves all extremities spontaneously. HEENT: Normal  Neck: Supple  without bruits or JVD. Lungs:  Resp regular and unlabored, CTA. Heart: IR, IR, no s3, s4, or murmurs. Abdomen: Soft, non-tender, non-distended, BS + x 4.  Extremities: No clubbing, cyanosis or edema. DP/PT/Radials 2+ and equal bilaterally.  Assessment & Plan  1.  Aortic stenosis s/p bioprosthetic AVR:  Pt is doing well.  Last echo was summer of 2013 showing nl LV fxn and normally fxn'ing Ao valve bioprosthesis.  No doe/chest pain/syncope.  Cont medical therapy.  2.  Afib:  Rate controlled on bb therapy.  He is followed closely in coumadin clinic.  3.  Erectile dysfxn:  His biggest complaint today is ED it's impact on his sex life.  He had previously been Rx viagra but never filled it and is again interested in trying it.  We discussed the importance of avoiding nitrates if her were to develop chest pain after using Viagra.  He has not had any chest pain with usual activities.  I have Rx viagra 25mg  daily prn , #5 with 3 refills.  4.  HTN:  Stable.  5.  H/O thrombotic MI:  No chest pain.  Active w/o limitations.  Cont bb.  He is not on asa since he is on warfarin.  6.  Dispo:  F/u 1 yr.  Nicolasa Ducking, NP 12/07/2012, 1:13 PM

## 2012-12-08 ENCOUNTER — Other Ambulatory Visit: Payer: Self-pay | Admitting: Cardiology

## 2012-12-09 ENCOUNTER — Telehealth: Payer: Self-pay | Admitting: Cardiology

## 2012-12-09 MED ORDER — SILDENAFIL CITRATE 50 MG PO TABS: 50.0000 mg | ORAL_TABLET | Freq: Every day | ORAL | Status: DC | PRN

## 2012-12-09 NOTE — Telephone Encounter (Signed)
Pt aware of change from 25mg  to 50mg .

## 2012-12-09 NOTE — Telephone Encounter (Signed)
Pt given rx for viagra 25mg , pharmacy only has 50mg , pills are diamond shaped and not scored however, can it be changed to 50mg ?

## 2012-12-23 ENCOUNTER — Ambulatory Visit (INDEPENDENT_AMBULATORY_CARE_PROVIDER_SITE_OTHER): Payer: Medicare Other | Admitting: *Deleted

## 2012-12-23 DIAGNOSIS — I359 Nonrheumatic aortic valve disorder, unspecified: Secondary | ICD-10-CM

## 2012-12-23 DIAGNOSIS — Z7901 Long term (current) use of anticoagulants: Secondary | ICD-10-CM

## 2012-12-23 DIAGNOSIS — I4891 Unspecified atrial fibrillation: Secondary | ICD-10-CM

## 2013-01-08 ENCOUNTER — Other Ambulatory Visit: Payer: Self-pay | Admitting: Cardiology

## 2013-01-08 NOTE — Telephone Encounter (Signed)
rx sent to pharmacy by e-script  

## 2013-01-13 ENCOUNTER — Ambulatory Visit (INDEPENDENT_AMBULATORY_CARE_PROVIDER_SITE_OTHER): Payer: Medicare Other | Admitting: *Deleted

## 2013-01-13 DIAGNOSIS — I4891 Unspecified atrial fibrillation: Secondary | ICD-10-CM

## 2013-01-13 DIAGNOSIS — I359 Nonrheumatic aortic valve disorder, unspecified: Secondary | ICD-10-CM

## 2013-01-27 ENCOUNTER — Ambulatory Visit (INDEPENDENT_AMBULATORY_CARE_PROVIDER_SITE_OTHER): Payer: Medicare Other | Admitting: *Deleted

## 2013-01-27 ENCOUNTER — Telehealth: Payer: Self-pay | Admitting: Cardiology

## 2013-01-27 DIAGNOSIS — I4891 Unspecified atrial fibrillation: Secondary | ICD-10-CM

## 2013-01-27 LAB — POCT INR: INR: 2

## 2013-01-27 NOTE — Telephone Encounter (Signed)
error 

## 2013-02-10 ENCOUNTER — Ambulatory Visit (INDEPENDENT_AMBULATORY_CARE_PROVIDER_SITE_OTHER): Payer: Medicare Other | Admitting: *Deleted

## 2013-02-10 DIAGNOSIS — Z7901 Long term (current) use of anticoagulants: Secondary | ICD-10-CM

## 2013-02-10 DIAGNOSIS — I4891 Unspecified atrial fibrillation: Secondary | ICD-10-CM

## 2013-02-10 DIAGNOSIS — I359 Nonrheumatic aortic valve disorder, unspecified: Secondary | ICD-10-CM

## 2013-02-10 LAB — POCT INR: INR: 2.7

## 2013-03-10 ENCOUNTER — Ambulatory Visit (INDEPENDENT_AMBULATORY_CARE_PROVIDER_SITE_OTHER): Payer: Medicare Other | Admitting: *Deleted

## 2013-03-10 DIAGNOSIS — I4891 Unspecified atrial fibrillation: Secondary | ICD-10-CM

## 2013-03-10 DIAGNOSIS — Z7901 Long term (current) use of anticoagulants: Secondary | ICD-10-CM

## 2013-03-10 DIAGNOSIS — I359 Nonrheumatic aortic valve disorder, unspecified: Secondary | ICD-10-CM

## 2013-03-10 LAB — POCT INR: INR: 1.7

## 2013-03-24 ENCOUNTER — Ambulatory Visit (INDEPENDENT_AMBULATORY_CARE_PROVIDER_SITE_OTHER): Payer: Medicare Other | Admitting: *Deleted

## 2013-03-24 DIAGNOSIS — I4891 Unspecified atrial fibrillation: Secondary | ICD-10-CM

## 2013-03-24 DIAGNOSIS — Z7901 Long term (current) use of anticoagulants: Secondary | ICD-10-CM

## 2013-03-24 DIAGNOSIS — I359 Nonrheumatic aortic valve disorder, unspecified: Secondary | ICD-10-CM

## 2013-03-31 ENCOUNTER — Ambulatory Visit (INDEPENDENT_AMBULATORY_CARE_PROVIDER_SITE_OTHER): Payer: Medicare Other | Admitting: *Deleted

## 2013-03-31 DIAGNOSIS — Z7901 Long term (current) use of anticoagulants: Secondary | ICD-10-CM

## 2013-03-31 DIAGNOSIS — I4891 Unspecified atrial fibrillation: Secondary | ICD-10-CM

## 2013-03-31 DIAGNOSIS — I359 Nonrheumatic aortic valve disorder, unspecified: Secondary | ICD-10-CM

## 2013-03-31 LAB — POCT INR: INR: 3.7

## 2013-04-14 ENCOUNTER — Ambulatory Visit (INDEPENDENT_AMBULATORY_CARE_PROVIDER_SITE_OTHER): Payer: Medicare Other | Admitting: *Deleted

## 2013-04-14 DIAGNOSIS — Z7901 Long term (current) use of anticoagulants: Secondary | ICD-10-CM

## 2013-04-14 DIAGNOSIS — I4891 Unspecified atrial fibrillation: Secondary | ICD-10-CM

## 2013-04-14 DIAGNOSIS — I359 Nonrheumatic aortic valve disorder, unspecified: Secondary | ICD-10-CM

## 2013-04-14 LAB — POCT INR: INR: 2.8

## 2013-04-30 ENCOUNTER — Telehealth: Payer: Self-pay | Admitting: Cardiology

## 2013-04-30 NOTE — Telephone Encounter (Signed)
Put him in to see me on one of the opened days in July.

## 2013-04-30 NOTE — Telephone Encounter (Signed)
New Problem:    Patient called in wanting to schedule an appointment to seen Dr. Shirlee Latch from his recall and there were no available slots.  Please call back.

## 2013-04-30 NOTE — Telephone Encounter (Signed)
Patient requesting appt in July (patient sees MD every six months). Patient requesting to be put on a waiting list. Informed patient he will be contacted next week to confirm appt, as Dr. Shirlee Latch and his nurse are not in today to provide changes or "add on's" to the schedule.  Routed to Dr. Shirlee Latch and Katina Dung, RN.

## 2013-05-03 NOTE — Telephone Encounter (Signed)
Is there a spot on July 28 or July 31 so  you could give him an appt one of these days?

## 2013-05-10 ENCOUNTER — Other Ambulatory Visit: Payer: Self-pay | Admitting: *Deleted

## 2013-05-10 MED ORDER — WARFARIN SODIUM 5 MG PO TABS: ORAL_TABLET | ORAL | Status: DC

## 2013-05-12 ENCOUNTER — Ambulatory Visit (INDEPENDENT_AMBULATORY_CARE_PROVIDER_SITE_OTHER): Payer: Medicare Other | Admitting: *Deleted

## 2013-05-12 DIAGNOSIS — I4891 Unspecified atrial fibrillation: Secondary | ICD-10-CM

## 2013-05-12 DIAGNOSIS — Z7901 Long term (current) use of anticoagulants: Secondary | ICD-10-CM

## 2013-05-12 DIAGNOSIS — I359 Nonrheumatic aortic valve disorder, unspecified: Secondary | ICD-10-CM

## 2013-06-09 ENCOUNTER — Ambulatory Visit (INDEPENDENT_AMBULATORY_CARE_PROVIDER_SITE_OTHER): Payer: Medicare Other | Admitting: *Deleted

## 2013-06-09 DIAGNOSIS — I4891 Unspecified atrial fibrillation: Secondary | ICD-10-CM

## 2013-06-09 DIAGNOSIS — Z7901 Long term (current) use of anticoagulants: Secondary | ICD-10-CM

## 2013-06-09 DIAGNOSIS — I359 Nonrheumatic aortic valve disorder, unspecified: Secondary | ICD-10-CM

## 2013-06-10 ENCOUNTER — Ambulatory Visit: Payer: Medicare Other | Admitting: Cardiology

## 2013-06-10 ENCOUNTER — Encounter: Payer: Self-pay | Admitting: Cardiology

## 2013-06-10 ENCOUNTER — Ambulatory Visit (INDEPENDENT_AMBULATORY_CARE_PROVIDER_SITE_OTHER): Payer: Medicare Other | Admitting: Cardiology

## 2013-06-10 VITALS — BP 122/64 | HR 68 | Ht 70.5 in | Wt 180.0 lb

## 2013-06-10 DIAGNOSIS — I2510 Atherosclerotic heart disease of native coronary artery without angina pectoris: Secondary | ICD-10-CM

## 2013-06-10 DIAGNOSIS — I4891 Unspecified atrial fibrillation: Secondary | ICD-10-CM

## 2013-06-10 DIAGNOSIS — I359 Nonrheumatic aortic valve disorder, unspecified: Secondary | ICD-10-CM

## 2013-06-10 MED ORDER — METOPROLOL SUCCINATE ER 50 MG PO TB24: 50.0000 mg | ORAL_TABLET | Freq: Two times a day (BID) | ORAL | Status: DC

## 2013-06-10 NOTE — Progress Notes (Signed)
Patient ID: [name], male   DOB: 09/27/1946, 67 y.o.   MRN: [id number] PCP: Dr. Renard Matter [name])  67 yo with history of atrial fibrillation, cardioembolic MI, and aortic stenosis s/p aortic valve replacement presents for cardiology followup.  He had his right knee replacement in 5/12 and left knee in 8/13.  Last echo in 7/13 showed EF 50-55% with well-seated bioprosthetic aortic valve.  No chest pain, no exertional dyspnea.  He remains in atrial fibrillation.  He has good exercise tolerance.  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  He golfs twice a week.   ECG: atrial fibrillation, inferior T wave flattening  Labs (1/12): K 4.7, creatinine 1.1, HDL 71, LDL 72 Labs (5/12): K 4.3, creatinine 0.81 Labs (7/12): LDL 47, HDL 55 Labs (8/12): K 4.5, creatinine 0.78 Labs (1/13): LDL 66, HDL 55, K 4.6, creatinine 1.1 Labs (1/14): K 4.5, creatinine 1.0, LDL 81, HDL 49  Allergies (verified):  1)  ! * Diclofenac  Past Medical History: 1. Myocardial infarction: Patient had an inferoposterior MI in 6/11 complicated by ventricular fibrillation arrest.  LHC showed occlusion of OM1 and and left-sided PDA.  Other vessels showed no angiographic CAD.  This was thought to be embolic and was treated with heparin gtt.  He was actually found soon thereafter to have extensive left atrial thrombus. TEE (8/11) showed EF 50%, well-seated bioprosthetic aortic valve.  Echo (7/12) showed moderate biatrial enlargement, EF 45-50% with severe inferoposterior hypokinesis, normal RV size and systolic function, normal functioning bioprosthetic aortic valve. Echo (7/13) with EF 50-55%, bioprosthetic aortic valve with mean gradient 8 mmHg.  2. Ventricular fibrillation arrest in setting of acute MI.  3. Aortic stenosis: Severe AS.  Patient had AV replacement with bioprosthetic aortic valve in 6/11. 4. Atrial fibrillation/flutter: Complicated by cardioembolic MI.  At time of aortic valve  replacement in 6/11, was noted to have extensive left atrial thrombus.  Thrombus was removed and patient had Maze procedure and left atrial appendage ligation.  Atrial fibrillation recurred and patient had 2 cardioversions while on amiodarone, neither succeeded.  He is now off amiodarone and rate controlled with coumadin anticoagulation.  5. Gout 6. History of pneumonia.  7. Diverticulosis.  8. OA: Right TKR (5/12)    Family History: No premature CAD  Social History: Lives in [place] with his wife.  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  Rare ETOH.  Tobacco Use - Former. pt no longer smokes cigars quite in April 24, 2010 Smoking Status:  Quit   Current Outpatient Prescriptions  Medication Sig Dispense Refill  . acetaminophen (TYLENOL) 500 MG tablet Take 500 mg by mouth every 6 (six) hours as needed. For knee pain.      Marland Kitchen amoxicillin (AMOXIL) 500 MG capsule Take 500 mg by mouth as needed. Takes prior to dental visits      . COLCRYS 0.6 MG tablet Take 1 tablet by mouth daily as needed. For gout flare up      . lisinopril (PRINIVIL,ZESTRIL) 5 MG tablet TAKE ONE (1) TABLET BY MOUTH EVERY DAY  30 tablet  5  . sildenafil (VIAGRA) 50 MG tablet Take 1 tablet (50 mg total) by mouth daily as needed for erectile dysfunction.  10 tablet  0  . warfarin (COUMADIN) 5 MG tablet Take 2 tablets daily or as directed  60 tablet  3  . metoprolol succinate (TOPROL-XL) 50 MG 24 hr tablet Take 1 tablet (50 mg total) by mouth  2 (two) times daily. Take with or immediately following a meal.  60 tablet  11   No current facility-administered medications for this visit.    BP 122/64  Pulse 68  Ht 5' 10.5" (1.791 m)  Wt 81.647 kg (180 lb)  BMI 25.45 kg/m2 General:  Well developed, well nourished, in no acute distress. Neck:  Neck supple, no JVD. No masses, thyromegaly or abnormal cervical nodes. Lungs:  Clear bilaterally to auscultation and percussion. Heart:  Non-displaced PMI, chest non-tender;  irregular rate and rhythm, S1, S2 without rubs or gallops. 1/6 early systolic murmur RUSB.  Carotid upstroke normal, no bruit. Pedals normal pulses. No ankle edema.  Abdomen:  Bowel sounds positive; abdomen soft and non-tender without masses, organomegaly, or hernias noted. No hepatosplenomegaly. Extremities:  No clubbing or cyanosis. Neurologic:  Alert and oriented x 3. Psych:  Normal affect.  Assessment/Plan: 1. Atrial fibrillation: Chronic.  Continue warfarin and Toprol XL.  2. Bioprosthetic aortic valve: Stable on 7/13 echo.  Would consider repeating echo in 7/15.  3. MI: Prior MI thought to be cardioembolic in setting of atrial fibrillation (not on anticoagulation).  He is on warfarin, should get bridging Lovenox if he has to come off warfarin.   Marca Ancona 06/10/2013

## 2013-06-10 NOTE — Patient Instructions (Addendum)
Your physician wants you to follow-up in: 6 months with Dr McLean. (January 2015).  You will receive a reminder letter in the mail two months in advance. If you don't receive a letter, please call our office to schedule the follow-up appointment.  

## 2013-06-14 ENCOUNTER — Ambulatory Visit (INDEPENDENT_AMBULATORY_CARE_PROVIDER_SITE_OTHER): Payer: Medicare Other | Admitting: Ophthalmology

## 2013-06-21 ENCOUNTER — Other Ambulatory Visit: Payer: Self-pay | Admitting: Cardiology

## 2013-07-01 ENCOUNTER — Ambulatory Visit (INDEPENDENT_AMBULATORY_CARE_PROVIDER_SITE_OTHER): Payer: Medicare Other | Admitting: Ophthalmology

## 2013-07-01 DIAGNOSIS — H33309 Unspecified retinal break, unspecified eye: Secondary | ICD-10-CM

## 2013-07-01 DIAGNOSIS — I10 Essential (primary) hypertension: Secondary | ICD-10-CM

## 2013-07-01 DIAGNOSIS — H43819 Vitreous degeneration, unspecified eye: Secondary | ICD-10-CM

## 2013-07-01 DIAGNOSIS — H35039 Hypertensive retinopathy, unspecified eye: Secondary | ICD-10-CM

## 2013-07-08 ENCOUNTER — Ambulatory Visit (INDEPENDENT_AMBULATORY_CARE_PROVIDER_SITE_OTHER): Payer: Medicare Other | Admitting: *Deleted

## 2013-07-08 DIAGNOSIS — Z7901 Long term (current) use of anticoagulants: Secondary | ICD-10-CM

## 2013-07-08 DIAGNOSIS — I4891 Unspecified atrial fibrillation: Secondary | ICD-10-CM

## 2013-07-08 DIAGNOSIS — I359 Nonrheumatic aortic valve disorder, unspecified: Secondary | ICD-10-CM

## 2013-07-08 LAB — POCT INR: INR: 3.1

## 2013-08-10 ENCOUNTER — Ambulatory Visit (INDEPENDENT_AMBULATORY_CARE_PROVIDER_SITE_OTHER): Payer: Medicare Other | Admitting: Surgery

## 2013-08-10 ENCOUNTER — Encounter (INDEPENDENT_AMBULATORY_CARE_PROVIDER_SITE_OTHER): Payer: Self-pay | Admitting: Surgery

## 2013-08-10 ENCOUNTER — Encounter (INDEPENDENT_AMBULATORY_CARE_PROVIDER_SITE_OTHER): Payer: Self-pay | Admitting: General Surgery

## 2013-08-10 VITALS — BP 130/76 | HR 72 | Temp 97.6°F | Resp 14 | Ht 70.0 in | Wt 182.6 lb

## 2013-08-10 DIAGNOSIS — K4090 Unilateral inguinal hernia, without obstruction or gangrene, not specified as recurrent: Secondary | ICD-10-CM | POA: Insufficient documentation

## 2013-08-10 NOTE — Progress Notes (Signed)
Patient ID: [name], male   DOB: 04-09-1946, 67 y.o.   MRN: [id number]  Chief Complaint  Patient presents with  . New Evaluation    eval RIH    HPI [name] is a 67 y.o. male.   HPI This pleasant gentleman is a self-referral for evaluation of a possible right inguinal hernia. He has had intermittent right groin pain and a bulge for several weeks. The pain is mild and refers to the hip. It is described as a burning pain. He has no nausea or vomiting or obstructive symptoms. He is otherwise without complaints. He is followed closely by his cardiologist Dr. Shirlee Latch regarding his Coumadin. He has been stable from a cardiac standpoint Past Medical History  Diagnosis Date  . Myocardial infarction   . Aortic stenosis     a. s/p bioprosth AVR 04/2010;  b. 05/2012 Echo: EF 50-55%, nl bioprosth fxn w/ gradient, mold dil LA.  Marland Kitchen Gout   . History of pneumonia   . Diverticulosis   . CAD (coronary artery disease)     a. s/p MI/VF arrest 04/2010 w/ cath showing OM1 and LPDA a occlusions 2/2 thrombus in.  Cors otw nonobs.  . DJD (degenerative joint disease)     a. s/p R TKA 2010;  b. s/p L TKA 2013.  Marland Kitchen Ventricular fibrillation     a. VR arrest in setting of thrombotic MI 04/2010.  . Atrial fibrillation or flutter     a. s/p Cox-Maze @ time of AVR;  b. Previously on Amio and s/p DCCV x 2 ->failed->now rate controlled and on chronic coumadin.  Marland Kitchen COPD (chronic obstructive pulmonary disease)     bronchitis HX  . Thrombus of left atrial appendage Hx of -     a. 04/2010    Past Surgical History  Procedure Laterality Date  . Right ankle pinning      Fell from tree w/fracture several years ago.  . Right total knee replacement  04/02/2011  . 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 Retinal laser procedure      right eye   . Total knee arthroplasty  07/20/2012    Procedure: TOTAL KNEE ARTHROPLASTY;  Surgeon: Nilda Simmer, MD;  Location: [facility] OR;  Service:  Orthopedics;  Laterality: Left;  left total knee arthroplasty    History reviewed. No pertinent family history.  Social History History  Substance Use Topics  . Smoking status: Former Smoker    Types: Cigars    Quit date: 04/24/2010  . Smokeless tobacco: Never Used  . Alcohol Use: Yes     Comment: occasional    Allergies  Allergen Reactions  . Diclofenac Itching  . Naproxen     itching    Current Outpatient Prescriptions  Medication Sig Dispense Refill  . acetaminophen (TYLENOL) 500 MG tablet Take 500 mg by mouth every 6 (six) hours as needed. For knee pain.      Marland Kitchen allopurinol (ZYLOPRIM) 100 MG tablet       . amoxicillin (AMOXIL) 500 MG capsule Take 500 mg by mouth as needed. Takes prior to dental visits      . COLCRYS 0.6 MG tablet Take 1 tablet by mouth daily as needed. For gout flare up      . lisinopril (PRINIVIL,ZESTRIL) 5 MG tablet TAKE ONE (1) TABLET BY MOUTH EVERY DAY  90 tablet  3  . methylPREDNIsolone (MEDROL DOSPACK) 4 MG tablet       .  metoprolol succinate (TOPROL-XL) 50 MG 24 hr tablet Take 1 tablet (50 mg total) by mouth 2 (two) times daily. Take with or immediately following a meal.  60 tablet  11  . sildenafil (VIAGRA) 50 MG tablet Take 1 tablet (50 mg total) by mouth daily as needed for erectile dysfunction.  10 tablet  0  . warfarin (COUMADIN) 5 MG tablet Take 2 tablets daily or as directed  60 tablet  3   No current facility-administered medications for this visit.    Review of Systems Review of Systems  Constitutional: Negative for fever, chills and unexpected weight change.  HENT: Negative for hearing loss, congestion, sore throat, trouble swallowing and voice change.   Eyes: Negative for visual disturbance.  Respiratory: Negative for cough and wheezing.   Cardiovascular: Negative for chest pain, palpitations and leg swelling.  Gastrointestinal: Negative for nausea, vomiting, abdominal pain, diarrhea, constipation, blood in stool, abdominal  distention, anal bleeding and rectal pain.  Genitourinary: Negative for hematuria and difficulty urinating.  Musculoskeletal: Negative for arthralgias.  Skin: Negative for rash and wound.  Neurological: Negative for seizures, syncope, weakness and headaches.  Hematological: Negative for adenopathy. Does not bruise/bleed easily.  Psychiatric/Behavioral: Negative for confusion.    Blood pressure 130/76, pulse 72, temperature 97.6 F (36.4 C), temperature source Temporal, resp. rate 14, height 5\' 10"  (1.778 m), weight 182 lb 9.6 oz (82.827 kg).  Physical Exam Physical Exam  Constitutional: He is oriented to person, place, and time. He appears well-developed and well-nourished. No distress.  HENT:  Head: Normocephalic and atraumatic.  Right Ear: External ear normal.  Left Ear: External ear normal.  Nose: Nose normal.  Mouth/Throat: Oropharynx is clear and moist. No oropharyngeal exudate.  Eyes: Conjunctivae are normal. Pupils are equal, round, and reactive to light. Right eye exhibits no discharge. Left eye exhibits no discharge. No scleral icterus.  Neck: Normal range of motion. Neck supple. No tracheal deviation present. No thyromegaly present.  Cardiovascular: Normal rate and intact distal pulses.   Murmur heard. IRR  Pulmonary/Chest: Breath sounds normal. No respiratory distress. He has no wheezes. He has no rales.  Abdominal: Soft. Bowel sounds are normal. There is no tenderness. There is no rebound and no guarding.  Easily reducible, mildly tender right inguinal hernia  Musculoskeletal: Normal range of motion. He exhibits no edema and no tenderness.  Lymphadenopathy:    He has no cervical adenopathy.  Neurological: He is alert and oriented to person, place, and time.  Skin: Skin is warm and dry. No rash noted. He is not diaphoretic. No erythema.  Psychiatric: His behavior is normal.    Data Reviewed   Assessment    Right inguinal hernia     Plan    Because he is  symptomatic and on chronic anticoagulation, elective repair of the right inguinal hernia with mesh was recommended. I discussed the diagnosis with him. I also discussed the surgical procedure. I discussed the risks of the surgery which includes but is not limited to bleeding, infection, nerve entrapment, chronic pain, recurrence, use of mesh, etc. I also discussed postoperative recovery. We will notify Dr. Alford Highland office. He will need to be bridged with Lovenox and come off his Coumadin preoperatively       Shallen Luedke A 08/10/2013, 11:07 AM

## 2013-08-11 ENCOUNTER — Telehealth: Payer: Self-pay | Admitting: Cardiology

## 2013-08-11 ENCOUNTER — Telehealth (INDEPENDENT_AMBULATORY_CARE_PROVIDER_SITE_OTHER): Payer: Self-pay | Admitting: *Deleted

## 2013-08-11 ENCOUNTER — Telehealth (INDEPENDENT_AMBULATORY_CARE_PROVIDER_SITE_OTHER): Payer: Self-pay | Admitting: General Surgery

## 2013-08-11 NOTE — Telephone Encounter (Signed)
Will forward to Dr McLean 

## 2013-08-11 NOTE — Telephone Encounter (Signed)
New Problem  Pt request surgical clearance for a hernia conducted by Dr. Magnus Ivan. Please call Dr. Eliberto Ivory office with the [facility]. Pt request a confirmation call that it has been completed.   Dr. Magnus Ivan Office @ [facility] [place] [facility] [phone]

## 2013-08-11 NOTE — Telephone Encounter (Signed)
Patient called to ask for an update.  He asked if Pattricia Boss had received everything so he could go ahead and get his surgery scheduled.  Explained that Pattricia Boss is working with patients right now however a message will be sent to her to ask. Patient states understanding at this time.

## 2013-08-11 NOTE — Telephone Encounter (Signed)
Can come off coumadin for surgery but will need Lovenox bridge.

## 2013-08-11 NOTE — Telephone Encounter (Signed)
Called patient back to let him know that I have not received a cardiac clearance yet from Dr Shirlee Latch and I told the patient that he was just seen yesterday and Dr Shirlee Latch may or may not be in the office, but once I received the clearance I will call him and his orders will go to the schedulers

## 2013-08-12 ENCOUNTER — Telehealth: Payer: Self-pay

## 2013-08-12 NOTE — Telephone Encounter (Signed)
Will fax note to Dr Magnus Ivan and forward note to Roderick Pee in [place] [facility]

## 2013-08-12 NOTE — Telephone Encounter (Signed)
Spoke with pt.  He is pending hernia surgery.  He will need to be bridged with Lovenox.  Told pt to let me know surgical date as soon as he finds out.

## 2013-08-12 NOTE — Telephone Encounter (Signed)
Pt.notified

## 2013-08-12 NOTE — Telephone Encounter (Signed)
Pt calling to schedule his surgery.

## 2013-08-12 NOTE — Telephone Encounter (Signed)
Call patient please 

## 2013-08-13 NOTE — Telephone Encounter (Signed)
Follow up     Patient calling stating that Dr. Magnus Ivan office has not receive the fax.   Office phone #  [phone] .

## 2013-08-13 NOTE — Telephone Encounter (Signed)
Message sent to Katina Dung RN Dr.McLean's nurse and to Vashti Hey RN in [place] [facility].

## 2013-08-13 NOTE — Telephone Encounter (Signed)
Returned call to patient no answer.Left message on personal voice mail Dr.Mclean's nurse not in office today will sent message

## 2013-08-13 NOTE — Telephone Encounter (Signed)
Pt calling again to check on his cardiac clearance.  Was told message would be passed on to the nurse.

## 2013-08-16 ENCOUNTER — Encounter (INDEPENDENT_AMBULATORY_CARE_PROVIDER_SITE_OTHER): Payer: Self-pay

## 2013-08-16 ENCOUNTER — Telehealth (INDEPENDENT_AMBULATORY_CARE_PROVIDER_SITE_OTHER): Payer: Self-pay | Admitting: General Surgery

## 2013-08-16 ENCOUNTER — Telehealth: Payer: Self-pay | Admitting: *Deleted

## 2013-08-16 NOTE — Telephone Encounter (Signed)
Called patient wife and told her that I have received cardiac clearance from Dr Shirlee Latch office today and I will let Dr Magnus Ivan review and sign off the clearance and put patient orders up to the scheduler today

## 2013-08-16 NOTE — Telephone Encounter (Signed)
Called Dr. Shirlee Latch office informed MR of fax  [phone]

## 2013-08-16 NOTE — Telephone Encounter (Signed)
Hernia surgery is scheduled for 08/25/13.  Has an appt for INR check on 10/9.  He will be given instructions for stopping coumadin and bridging with lovenox at that appt.  Pt verbalizes understanding.

## 2013-08-16 NOTE — Telephone Encounter (Signed)
Note refaxed to Dr Magnus Ivan

## 2013-08-16 NOTE — Telephone Encounter (Signed)
Please call patient in regards to needing to be started on Lovenox / tgs

## 2013-08-19 ENCOUNTER — Encounter (HOSPITAL_COMMUNITY): Payer: Self-pay | Admitting: Pharmacist

## 2013-08-19 ENCOUNTER — Ambulatory Visit (INDEPENDENT_AMBULATORY_CARE_PROVIDER_SITE_OTHER): Payer: Medicare Other | Admitting: *Deleted

## 2013-08-19 DIAGNOSIS — Z7901 Long term (current) use of anticoagulants: Secondary | ICD-10-CM

## 2013-08-19 DIAGNOSIS — I4891 Unspecified atrial fibrillation: Secondary | ICD-10-CM

## 2013-08-19 DIAGNOSIS — I359 Nonrheumatic aortic valve disorder, unspecified: Secondary | ICD-10-CM

## 2013-08-19 LAB — POCT INR: INR: 2.9

## 2013-08-19 MED ORDER — ENOXAPARIN SODIUM 120 MG/0.8ML ~~LOC~~ SOLN: 120.0000 mg | Freq: Every day | SUBCUTANEOUS | Status: DC

## 2013-08-19 NOTE — Patient Instructions (Signed)
10/9  Last dose of coumadin 10/10  No lovenox or coumadin 10/11  Lovenox 120mg  sq 8am 10/12  Lovenox 120mg  sq 8am 10/13  Lovenox 120mg  sq 8am 10/14  Lovenox 120mg  sq 8am 10/15  NO Lovenox in am ------surgery----- coumadin 2 tablets pm and Lovenox 120mg  8pm 10/16  coumadin 2 tablets pm and Lovenox 120mg  8pm 10/17  coumadin 2 tablets pm and Lovenox 120mg  8pm 10/18  coumadin 2 tablets pm and Lovenox 120mg  8pm 10/19  coumadin 2 tablets pm and Lovenox 120mg  8pm 10/20  INR appt  11:40 am

## 2013-08-23 ENCOUNTER — Encounter (HOSPITAL_COMMUNITY)
Admission: RE | Admit: 2013-08-23 | Discharge: 2013-08-23 | Disposition: A | Discharge status: Home / Self Care | Payer: Medicare Other | Source: Ambulatory Visit | Attending: Surgery | Admitting: Surgery

## 2013-08-23 ENCOUNTER — Encounter (HOSPITAL_COMMUNITY): Payer: Self-pay

## 2013-08-23 ENCOUNTER — Ambulatory Visit (HOSPITAL_COMMUNITY)
Admission: RE | Admit: 2013-08-23 | Discharge: 2013-08-23 | Disposition: A | Discharge status: Home / Self Care | Payer: Medicare Other | Source: Ambulatory Visit | Attending: Surgery | Admitting: Surgery

## 2013-08-23 ENCOUNTER — Other Ambulatory Visit (INDEPENDENT_AMBULATORY_CARE_PROVIDER_SITE_OTHER): Payer: Self-pay | Admitting: Surgery

## 2013-08-23 DIAGNOSIS — I4891 Unspecified atrial fibrillation: Secondary | ICD-10-CM | POA: Insufficient documentation

## 2013-08-23 DIAGNOSIS — I252 Old myocardial infarction: Secondary | ICD-10-CM | POA: Insufficient documentation

## 2013-08-23 DIAGNOSIS — Z01818 Encounter for other preprocedural examination: Secondary | ICD-10-CM | POA: Insufficient documentation

## 2013-08-23 DIAGNOSIS — Z01812 Encounter for preprocedural laboratory examination: Secondary | ICD-10-CM | POA: Insufficient documentation

## 2013-08-23 DIAGNOSIS — K4090 Unilateral inguinal hernia, without obstruction or gangrene, not specified as recurrent: Secondary | ICD-10-CM | POA: Insufficient documentation

## 2013-08-23 LAB — CBC
Hemoglobin: 12.2 g/dL — ABNORMAL LOW (ref 13.0–17.0)
MCV: 92.8 fL (ref 78.0–100.0)
Platelets: 297 10*3/uL (ref 150–400)
RBC: 3.89 MIL/uL — ABNORMAL LOW (ref 4.22–5.81)
RDW: 13.5 % (ref 11.5–15.5)
WBC: 8.2 10*3/uL (ref 4.0–10.5)

## 2013-08-23 LAB — PROTIME-INR
INR: 1.36 (ref 0.00–1.49)
Prothrombin Time: 16.4 seconds — ABNORMAL HIGH (ref 11.6–15.2)

## 2013-08-23 LAB — BASIC METABOLIC PANEL
CO2: 25 mEq/L (ref 19–32)
Chloride: 99 mEq/L (ref 96–112)
Creatinine, Ser: 0.9 mg/dL (ref 0.50–1.35)
GFR calc Af Amer: >90 mL/min (ref >90)
Potassium: 4.2 mEq/L (ref 3.5–5.1)

## 2013-08-23 LAB — APTT: aPTT: 46 seconds — ABNORMAL HIGH (ref 24–37)

## 2013-08-23 NOTE — Pre-Procedure Instructions (Signed)
[name] [facility]  08/23/2013   Your procedure is scheduled on:   Wednesday  08/25/13   Report to [facility]  2 * 3 at 630 AM.  Call this number if you have problems the morning of surgery: [phone]   Remember:   Do not eat food or drink liquids after midnight.   Take these medicines the morning of surgery with A SIP OF WATER:  Tylenol if needed, allopurinol, metoprolol (toprol xl)  (STOP ASPIRIN, COUMADIN, PLAVIX, EFFIENT, HERBAL MEDICINES)   Do not wear jewelry, make-up or nail polish.  Do not wear lotions, powders, or perfumes. You may wear deodorant.  Do not shave 48 hours prior to surgery. Men may shave face and neck.  Do not bring valuables to the hospital.  [facility] is not responsible                  for any belongings or valuables.               Contacts, dentures or bridgework may not be worn into surgery.  Leave suitcase in the car. After surgery it may be brought to your room.  For patients admitted to the hospital, discharge time is determined by your                treatment team.               Patients discharged the day of surgery will not be allowed to drive  home.  Name and phone number of your driver:  Special Instructions: Shower using CHG 2 nights before surgery and the night before surgery.  If you shower the day of surgery use CHG.  Use special wash - you have one bottle of CHG for all showers.  You should use approximately 1/3 of the bottle for each shower.   Please read over the following fact sheets that you were given: Pain Booklet, Coughing and Deep Breathing and Surgical Site Infection Prevention

## 2013-08-24 MED ORDER — CEFAZOLIN SODIUM-DEXTROSE 2-3 GM-% IV SOLR
2.0000 g | INTRAVENOUS | Status: AC
  Administered 2013-08-25: 2 g via INTRAVENOUS
  Filled 2013-08-24: qty 50

## 2013-08-24 NOTE — Progress Notes (Signed)
Anesthesia chart review: Patient is a 67 year old male scheduled for right inguinal hernia repair on 08/25/13 by Dr. Magnus Ivan.    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 Echo showed extensive LA thrombus and severe AS. MI was thought to be embolic and was treated with heparin gtt. He underwent AVR (bioprosthetic) on 05/01/10. Other history includes afib s/p unsuccessful cardioversion, former smoker, gout, COPD, PNA, diverticulosis, right TKR '12, left TKR '13. PCP is Dr. Renard Matter.   Cardiologist is Dr. Shirlee Latch. Telephone notes in Epic indicate that Dr. Shirlee Latch did clear patient for this procedure.  He is suppose to bridge with Lovenox while off Coumadin.  EKG on 06/10/13 showed afib @ 68 bpm, low voltage QRS, non-specific T wave abnormality.   Echo on 05/22/12 showed:  - Left ventricle: The cavity size was normal. Wall thickness was normal. Systolic function was normal. The estimated ejection fraction was in the range of 50% to 55%. - Aortic valve: A bioprosthesis was present. Mean gradient: 8mm Hg (S). Peak gradient: 14mm Hg (S). - Left atrium: The atrium was moderately dilated. - Right atrium: The atrium was mildly dilated.   His last cardiac cath was on in June 2011, prior to his AVR.  CXR on 08/23/13 showed:  Likely nipple shadow overlying the right lung base. Followup exam with nipple markers is recommended. No other focal abnormality is noted. (This was noted as reviewed in Epic by Dr. Magnus Ivan, so will defer additional orders to him.)  Labs noted. Repeat PT/PTT on the day of surgery.  Anticipate that if no acute changes then he can proceed as planned.  Velna Ochs [facility] Stay [facility]/Anesthesiology Phone [phone] 08/24/2013 9:46 AM

## 2013-08-24 NOTE — H&P (Signed)
Patient ID: [name], male DOB: 04-Dec-1945, 67 y.o. MRN: [id number]  Chief Complaint   Patient presents with   .  New Evaluation     eval RIH   HPI  [name] is a 67 y.o. male.  HPI  This pleasant gentleman is a self-referral for evaluation of a possible right inguinal hernia. He has had intermittent right groin pain and a bulge for several weeks. The pain is mild and refers to the hip. It is described as a burning pain. He has no nausea or vomiting or obstructive symptoms. He is otherwise without complaints. He is followed closely by his cardiologist Dr. Shirlee Latch regarding his Coumadin. He has been stable from a cardiac standpoint  Past Medical History   Diagnosis  Date   .  Myocardial infarction    .  Aortic stenosis      a. s/p bioprosth AVR 04/2010; b. 05/2012 Echo: EF 50-55%, nl bioprosth fxn w/ gradient, mold dil LA.   Marland Kitchen  Gout    .  History of pneumonia    .  Diverticulosis    .  CAD (coronary artery disease)      a. s/p MI/VF arrest 04/2010 w/ cath showing OM1 and LPDA a occlusions 2/2 thrombus in. Cors otw nonobs.   .  DJD (degenerative joint disease)      a. s/p R TKA 2010; b. s/p L TKA 2013.   Marland Kitchen  Ventricular fibrillation      a. VR arrest in setting of thrombotic MI 04/2010.   .  Atrial fibrillation or flutter      a. s/p Cox-Maze @ time of AVR; b. Previously on Amio and s/p DCCV x 2 ->failed->now rate controlled and on chronic coumadin.   Marland Kitchen  COPD (chronic obstructive pulmonary disease)      bronchitis HX   .  Thrombus of left atrial appendage  Hx of -     a. 04/2010    Past Surgical History   Procedure  Laterality  Date   .  Right ankle pinning       Fell from tree w/fracture several years ago.   .  Right total knee replacement   04/02/2011   .  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  Retinal laser procedure       right eye   .  Total knee arthroplasty   07/20/2012     Procedure: TOTAL KNEE ARTHROPLASTY; Surgeon:  Nilda Simmer, MD; Location: [facility] OR; Service: Orthopedics; Laterality: Left; left total knee arthroplasty   History reviewed. No pertinent family history.  Social History  History   Substance Use Topics   .  Smoking status:  Former Smoker     Types:  Cigars     Quit date:  04/24/2010   .  Smokeless tobacco:  Never Used   .  Alcohol Use:  Yes      Comment: occasional    Allergies   Allergen  Reactions   .  Diclofenac  Itching   .  Naproxen      itching    Current Outpatient Prescriptions   Medication  Sig  Dispense  Refill   .  acetaminophen (TYLENOL) 500 MG tablet  Take 500 mg by mouth every 6 (six) hours as needed. For knee pain.     Marland Kitchen  allopurinol (ZYLOPRIM) 100 MG tablet      .  amoxicillin (AMOXIL) 500 MG capsule  Take 500 mg by mouth as needed. Takes prior to dental visits     .  COLCRYS 0.6 MG tablet  Take 1 tablet by mouth daily as needed. For gout flare up     .  lisinopril (PRINIVIL,ZESTRIL) 5 MG tablet  TAKE ONE (1) TABLET BY MOUTH EVERY DAY  90 tablet  3   .  methylPREDNIsolone (MEDROL DOSPACK) 4 MG tablet      .  metoprolol succinate (TOPROL-XL) 50 MG 24 hr tablet  Take 1 tablet (50 mg total) by mouth 2 (two) times daily. Take with or immediately following a meal.  60 tablet  11   .  sildenafil (VIAGRA) 50 MG tablet  Take 1 tablet (50 mg total) by mouth daily as needed for erectile dysfunction.  10 tablet  0   .  warfarin (COUMADIN) 5 MG tablet  Take 2 tablets daily or as directed  60 tablet  3    No current facility-administered medications for this visit.   Review of Systems  Review of Systems  Constitutional: Negative for fever, chills and unexpected weight change.  HENT: Negative for hearing loss, congestion, sore throat, trouble swallowing and voice change.  Eyes: Negative for visual disturbance.  Respiratory: Negative for cough and wheezing.  Cardiovascular: Negative for chest pain, palpitations and leg swelling.  Gastrointestinal: Negative for nausea,  vomiting, abdominal pain, diarrhea, constipation, blood in stool, abdominal distention, anal bleeding and rectal pain.  Genitourinary: Negative for hematuria and difficulty urinating.  Musculoskeletal: Negative for arthralgias.  Skin: Negative for rash and wound.  Neurological: Negative for seizures, syncope, weakness and headaches.  Hematological: Negative for adenopathy. Does not bruise/bleed easily.  Psychiatric/Behavioral: Negative for confusion.  Blood pressure 130/76, pulse 72, temperature 97.6 F (36.4 C), temperature source Temporal, resp. rate 14, height 5\' 10"  (1.778 m), weight 182 lb 9.6 oz (82.827 kg).  Physical Exam  Physical Exam  Constitutional: He is oriented to person, place, and time. He appears well-developed and well-nourished. No distress.  HENT:  Head: Normocephalic and atraumatic.  Right Ear: External ear normal.  Left Ear: External ear normal.  Nose: Nose normal.  Mouth/Throat: Oropharynx is clear and moist. No oropharyngeal exudate.  Eyes: Conjunctivae are normal. Pupils are equal, round, and reactive to light. Right eye exhibits no discharge. Left eye exhibits no discharge. No scleral icterus.  Neck: Normal range of motion. Neck supple. No tracheal deviation present. No thyromegaly present.  Cardiovascular: Normal rate and intact distal pulses.  Murmur heard. IRR  Pulmonary/Chest: Breath sounds normal. No respiratory distress. He has no wheezes. He has no rales.  Abdominal: Soft. Bowel sounds are normal. There is no tenderness. There is no rebound and no guarding.  Easily reducible, mildly tender right inguinal hernia  Musculoskeletal: Normal range of motion. He exhibits no edema and no tenderness.  Lymphadenopathy:  He has no cervical adenopathy.  Neurological: He is alert and oriented to person, place, and time.  Skin: Skin is warm and dry. No rash noted. He is not diaphoretic. No erythema.  Psychiatric: His behavior is normal.  Data Reviewed  Assessment   Right inguinal hernia  Plan  Because he is symptomatic and on chronic anticoagulation, elective repair of the right inguinal hernia with mesh was recommended. I discussed the diagnosis with him. I also discussed the surgical procedure. I discussed the risks of the surgery which includes but is not limited to bleeding, infection, nerve entrapment, chronic pain, recurrence, use of mesh, etc. I also discussed postoperative recovery.  We will notify Dr. Alford Highland office. He will need to be bridged with Lovenox and come off his Coumadin preoperatively

## 2013-08-25 ENCOUNTER — Telehealth (INDEPENDENT_AMBULATORY_CARE_PROVIDER_SITE_OTHER): Payer: Self-pay

## 2013-08-25 ENCOUNTER — Encounter (HOSPITAL_COMMUNITY): Payer: Self-pay | Admitting: *Deleted

## 2013-08-25 ENCOUNTER — Encounter (HOSPITAL_COMMUNITY)
Admission: RE | Disposition: A | Discharge status: Home / Self Care | Payer: Self-pay | Source: Ambulatory Visit | Attending: Surgery

## 2013-08-25 ENCOUNTER — Encounter (HOSPITAL_COMMUNITY): Payer: Medicare Other | Admitting: Vascular Surgery

## 2013-08-25 ENCOUNTER — Ambulatory Visit (HOSPITAL_COMMUNITY): Payer: Medicare Other | Admitting: Certified Registered"

## 2013-08-25 ENCOUNTER — Ambulatory Visit (HOSPITAL_COMMUNITY)
Admission: RE | Admit: 2013-08-25 | Discharge: 2013-08-25 | Disposition: A | Discharge status: Home / Self Care | Payer: Medicare Other | Source: Ambulatory Visit | Attending: Surgery | Admitting: Surgery

## 2013-08-25 DIAGNOSIS — Z952 Presence of prosthetic heart valve: Secondary | ICD-10-CM | POA: Insufficient documentation

## 2013-08-25 DIAGNOSIS — Z96659 Presence of unspecified artificial knee joint: Secondary | ICD-10-CM | POA: Insufficient documentation

## 2013-08-25 DIAGNOSIS — K4090 Unilateral inguinal hernia, without obstruction or gangrene, not specified as recurrent: Secondary | ICD-10-CM | POA: Insufficient documentation

## 2013-08-25 DIAGNOSIS — Z87891 Personal history of nicotine dependence: Secondary | ICD-10-CM | POA: Insufficient documentation

## 2013-08-25 DIAGNOSIS — M1990 Unspecified osteoarthritis, unspecified site: Secondary | ICD-10-CM | POA: Insufficient documentation

## 2013-08-25 DIAGNOSIS — K5730 Diverticulosis of large intestine without perforation or abscess without bleeding: Secondary | ICD-10-CM | POA: Insufficient documentation

## 2013-08-25 DIAGNOSIS — Z79899 Other long term (current) drug therapy: Secondary | ICD-10-CM | POA: Insufficient documentation

## 2013-08-25 DIAGNOSIS — J4489 Other specified chronic obstructive pulmonary disease: Secondary | ICD-10-CM | POA: Insufficient documentation

## 2013-08-25 DIAGNOSIS — I4891 Unspecified atrial fibrillation: Secondary | ICD-10-CM | POA: Insufficient documentation

## 2013-08-25 DIAGNOSIS — M109 Gout, unspecified: Secondary | ICD-10-CM | POA: Insufficient documentation

## 2013-08-25 DIAGNOSIS — I252 Old myocardial infarction: Secondary | ICD-10-CM | POA: Insufficient documentation

## 2013-08-25 DIAGNOSIS — J449 Chronic obstructive pulmonary disease, unspecified: Secondary | ICD-10-CM | POA: Insufficient documentation

## 2013-08-25 HISTORY — PX: INGUINAL HERNIA REPAIR: SHX194

## 2013-08-25 HISTORY — PX: INSERTION OF MESH: SHX5868

## 2013-08-25 LAB — PROTIME-INR
INR: 1.12 (ref 0.00–1.49)
Prothrombin Time: 14.2 seconds (ref 11.6–15.2)

## 2013-08-25 LAB — APTT: aPTT: 39 seconds — ABNORMAL HIGH (ref 24–37)

## 2013-08-25 SURGERY — REPAIR, HERNIA, INGUINAL, ADULT
Anesthesia: General | Site: Groin | Laterality: Right | Wound class: Clean

## 2013-08-25 MED ORDER — MIDAZOLAM HCL 5 MG/5ML IJ SOLN
INTRAMUSCULAR | Status: DC | PRN
  Administered 2013-08-25: 1 mg via INTRAVENOUS

## 2013-08-25 MED ORDER — OXYCODONE HCL 5 MG PO TABS
5.0000 mg | ORAL_TABLET | ORAL | Status: DC | PRN
  Administered 2013-08-25: 10 mg via ORAL

## 2013-08-25 MED ORDER — SUFENTANIL CITRATE 50 MCG/ML IV SOLN
INTRAVENOUS | Status: DC | PRN
  Administered 2013-08-25: 10 ug via INTRAVENOUS

## 2013-08-25 MED ORDER — LACTATED RINGERS IV SOLN
INTRAVENOUS | Status: DC | PRN
  Administered 2013-08-25: 08:00:00 via INTRAVENOUS

## 2013-08-25 MED ORDER — 0.9 % SODIUM CHLORIDE (POUR BTL) OPTIME
TOPICAL | Status: DC | PRN
  Administered 2013-08-25: 1000 mL

## 2013-08-25 MED ORDER — LIDOCAINE HCL (CARDIAC) 20 MG/ML IV SOLN
INTRAVENOUS | Status: DC | PRN
  Administered 2013-08-25: 40 mg via INTRAVENOUS

## 2013-08-25 MED ORDER — BUPIVACAINE-EPINEPHRINE 0.5% -1:200000 IJ SOLN
INTRAMUSCULAR | Status: DC | PRN
  Administered 2013-08-25: 20 mL

## 2013-08-25 MED ORDER — OXYCODONE HCL 5 MG PO TABS
ORAL_TABLET | ORAL | Status: AC
  Administered 2013-08-25: 10 mg via ORAL
  Filled 2013-08-25: qty 2

## 2013-08-25 MED ORDER — ONDANSETRON HCL 4 MG/2ML IJ SOLN: 4.0000 mg | Freq: Once | INTRAMUSCULAR | Status: DC | PRN

## 2013-08-25 MED ORDER — OXYCODONE-ACETAMINOPHEN 5-325 MG PO TABS: 1.0000 | ORAL_TABLET | ORAL | Status: DC | PRN

## 2013-08-25 MED ORDER — PHENYLEPHRINE HCL 10 MG/ML IJ SOLN
INTRAMUSCULAR | Status: DC | PRN
  Administered 2013-08-25: 80 ug via INTRAVENOUS

## 2013-08-25 MED ORDER — HYDROMORPHONE HCL PF 1 MG/ML IJ SOLN
0.2500 mg | INTRAMUSCULAR | Status: DC | PRN
  Administered 2013-08-25 (×2): 0.5 mg via INTRAVENOUS

## 2013-08-25 MED ORDER — PROPOFOL 10 MG/ML IV BOLUS
INTRAVENOUS | Status: DC | PRN
  Administered 2013-08-25: 110 mg via INTRAVENOUS

## 2013-08-25 MED ORDER — ONDANSETRON HCL 4 MG/2ML IJ SOLN
INTRAMUSCULAR | Status: DC | PRN
  Administered 2013-08-25: 4 mg via INTRAMUSCULAR

## 2013-08-25 MED ORDER — HYDROMORPHONE HCL PF 1 MG/ML IJ SOLN
INTRAMUSCULAR | Status: AC
  Administered 2013-08-25: 0.5 mg via INTRAVENOUS
  Filled 2013-08-25: qty 1

## 2013-08-25 MED ORDER — BUPIVACAINE-EPINEPHRINE (PF) 0.5% -1:200000 IJ SOLN
INTRAMUSCULAR | Status: AC
  Filled 2013-08-25: qty 10

## 2013-08-25 MED ORDER — EPHEDRINE SULFATE 50 MG/ML IJ SOLN
INTRAMUSCULAR | Status: DC | PRN
  Administered 2013-08-25 (×2): 10 mg via INTRAVENOUS

## 2013-08-25 SURGICAL SUPPLY — 45 items
APL SKNCLS STERI-STRIP NONHPOA (GAUZE/BANDAGES/DRESSINGS) ×1
BENZOIN TINCTURE PRP APPL 2/3 (GAUZE/BANDAGES/DRESSINGS) ×2 IMPLANT
BLADE SURG 10 STRL SS (BLADE) ×2 IMPLANT
BLADE SURG 15 STRL LF DISP TIS (BLADE) ×1 IMPLANT
BLADE SURG 15 STRL SS (BLADE) ×2
BLADE SURG ROTATE 9660 (MISCELLANEOUS) ×2 IMPLANT
CHLORAPREP W/TINT 26ML (MISCELLANEOUS) ×2 IMPLANT
COVER SURGICAL LIGHT HANDLE (MISCELLANEOUS) ×2 IMPLANT
DRAIN PENROSE 1/2X12 LTX STRL (WOUND CARE) ×2 IMPLANT
DRAPE LAPAROTOMY TRNSV 102X78 (DRAPE) ×2 IMPLANT
DRESSING TELFA 8X3 (GAUZE/BANDAGES/DRESSINGS) ×2 IMPLANT
DRSG TEGADERM 4X4.75 (GAUZE/BANDAGES/DRESSINGS) ×2 IMPLANT
ELECT CAUTERY BLADE 6.4 (BLADE) ×2 IMPLANT
ELECT REM PT RETURN 9FT ADLT (ELECTROSURGICAL) ×2
ELECTRODE REM PT RTRN 9FT ADLT (ELECTROSURGICAL) ×1 IMPLANT
GAUZE SPONGE 2X2 8PLY STRL LF (GAUZE/BANDAGES/DRESSINGS) ×1 IMPLANT
GLOVE BIO SURGEON STRL SZ7.5 (GLOVE) ×2 IMPLANT
GLOVE BIOGEL PI IND STRL 7.0 (GLOVE) ×1 IMPLANT
GLOVE BIOGEL PI IND STRL 7.5 (GLOVE) ×1 IMPLANT
GLOVE BIOGEL PI INDICATOR 7.0 (GLOVE) ×1
GLOVE BIOGEL PI INDICATOR 7.5 (GLOVE) ×1
GLOVE SURG SIGNA 7.5 PF LTX (GLOVE) ×2 IMPLANT
GOWN STRL NON-REIN LRG LVL3 (GOWN DISPOSABLE) ×2 IMPLANT
GOWN STRL REIN XL XLG (GOWN DISPOSABLE) ×2 IMPLANT
KIT BASIN OR (CUSTOM PROCEDURE TRAY) ×2 IMPLANT
KIT ROOM TURNOVER OR (KITS) ×2 IMPLANT
MESH PARIETEX PROGRIP RIGHT (Mesh General) ×2 IMPLANT
NEEDLE HYPO 25GX1X1/2 BEV (NEEDLE) ×2 IMPLANT
NS IRRIG 1000ML POUR BTL (IV SOLUTION) ×2 IMPLANT
PACK SURGICAL SETUP 50X90 (CUSTOM PROCEDURE TRAY) ×2 IMPLANT
PAD ARMBOARD 7.5X6 YLW CONV (MISCELLANEOUS) ×4 IMPLANT
PENCIL BUTTON HOLSTER BLD 10FT (ELECTRODE) ×2 IMPLANT
SPECIMEN JAR SMALL (MISCELLANEOUS) IMPLANT
SPONGE GAUZE 2X2 STER 10/PKG (GAUZE/BANDAGES/DRESSINGS) ×1
SPONGE LAP 18X18 X RAY DECT (DISPOSABLE) ×2 IMPLANT
STRIP CLOSURE SKIN 1/2X4 (GAUZE/BANDAGES/DRESSINGS) ×2 IMPLANT
SUT MON AB 4-0 PC3 18 (SUTURE) ×2 IMPLANT
SUT SILK 2 0 SH (SUTURE) ×2 IMPLANT
SUT VIC AB 2-0 CT1 27 (SUTURE) ×4
SUT VIC AB 2-0 CT1 TAPERPNT 27 (SUTURE) ×2 IMPLANT
SUT VIC AB 3-0 CT1 27 (SUTURE) ×2
SUT VIC AB 3-0 CT1 TAPERPNT 27 (SUTURE) ×1 IMPLANT
SYR CONTROL 10ML LL (SYRINGE) ×2 IMPLANT
TOWEL OR 17X24 6PK STRL BLUE (TOWEL DISPOSABLE) ×2 IMPLANT
TOWEL OR 17X26 10 PK STRL BLUE (TOWEL DISPOSABLE) ×2 IMPLANT

## 2013-08-25 NOTE — Op Note (Signed)
NAMEJAIREN, GOLDFARB NO.:  [id number]  MEDICAL RECORD NO.:  [id number]  LOCATION:  [facility]                         FACILITY:  [facility]  PHYSICIAN:  Abigail Miyamoto, M.D. DATE OF BIRTH:  28-Jul-1946  DATE OF PROCEDURE:  08/25/2013 DATE OF DISCHARGE:  08/25/2013                              OPERATIVE REPORT   PREOPERATIVE DIAGNOSIS:  Right inguinal hernia.  POSTOPERATIVE DIAGNOSIS:  Right inguinal hernia.  PROCEDURE: Right inguinal hernia repair with mesh.  SURGEON:  Abigail Miyamoto, M.D.  ANESTHESIA:  General and 0.5% Marcaine.  ESTIMATED BLOOD LOSS:  Minimal.  FINDINGS:  The patient was found to have a moderate-sized indirect inguinal hernia.  It was repaired with a piece of Parietex ProGrip mesh.  PROCEDURE IN DETAIL:  The patient was brought to the operating room, identified as [name].  He was placed supine on the operating table, and general anesthesia was induced.  His right lower quadrant and groin were then prepped and draped in the usual sterile fashion.  I performed an ilioinguinal nerve block on the right side with Marcaine.  I then anesthetized the skin further with Marcaine.  I made a longitudinal incision with a scalpel.  I took this down through Scarpa fascia with the electrocautery.  External oblique fascia was then identified and opened towards internal and external rings.  The testicular cord and structures were controlled.  There was a large lipoma of the cord, which I excised with the cautery.  I then was able to separate the large indirect hernia sac from the surrounding cord structures.  There was no evidence of direct hernia sac.  I dissected the sac down to the base at the internal ring.  I then ligated the sac with a 2-0 silk suture after the contents were reduced back into the abdominal cavity.  I then excised the redundant sac.  A piece of Parietex ProGrip mesh was brought into the field.  I placed it as an onlay on the  inguinal floor and then brought around the cord structures.  I then secured it to the pubic tubercle with a 2-0 Vicryl suture.  Good coverage of the inguinal floor and internal ring appeared to be achieved.  I then closed the external oblique fascia over the top of this with a running 2-0 Vicryl suture.  I anesthetized the fascia further with Marcaine.  I closed the Scarpa fascia with interrupted 3-0 Vicryl sutures and closed the skin with a running 4-0 Monocryl.  Steri-Strips, Telfa, and Tegaderm were then applied.  The patient tolerated the procedure well.  All the counts were correct at the end of the procedure.  The patient was then extubated in the operating room and taken in stable condition to recovery room.     Abigail Miyamoto, M.D.     DB/MEDQ  D:  08/25/2013  T:  08/25/2013  Job:  [id number]

## 2013-08-25 NOTE — Transfer of Care (Signed)
Immediate Anesthesia Transfer of Care Note  Patient: [name]  Procedure(s) Performed: Procedure(s): HERNIA REPAIR INGUINAL ADULT (Right) INSERTION OF MESH (Right)  Patient Location: PACU  Anesthesia Type:General  Level of Consciousness: awake, alert  and oriented  Airway & Oxygen Therapy: Patient Spontanous Breathing and Patient connected to nasal cannula oxygen  Post-op Assessment: Report given to PACU RN, Post -op Vital signs reviewed and stable and Patient moving all extremities  Post vital signs: Reviewed and stable  Complications: No apparent anesthesia complications

## 2013-08-25 NOTE — Anesthesia Preprocedure Evaluation (Addendum)
Anesthesia Evaluation  Patient identified by MRN, date of birth, ID band Patient awake    Reviewed: Allergy & Precautions, H&P , NPO status , Patient's Chart, lab work & pertinent test results, reviewed documented beta blocker date and time   Airway Mallampati: I TM Distance: >3 FB Neck ROM: Full    Dental  (+) Teeth Intact and Dental Advisory Given   Pulmonary COPD breath sounds clear to auscultation        Cardiovascular + CAD and + Past MI Rhythm:Irregular Rate:Normal     Neuro/Psych    GI/Hepatic   Endo/Other    Renal/GU      Musculoskeletal   Abdominal   Peds  Hematology   Anesthesia Other Findings   Reproductive/Obstetrics                          Anesthesia Physical Anesthesia Plan  ASA: III  Anesthesia Plan: General   Post-op Pain Management:    Induction: Intravenous  Airway Management Planned: LMA  Additional Equipment:   Intra-op Plan:   Post-operative Plan: Extubation in OR  Informed Consent: I have reviewed the patients History and Physical, chart, labs and discussed the procedure including the risks, benefits and alternatives for the proposed anesthesia with the patient or authorized representative who has indicated his/her understanding and acceptance.   Dental advisory given  Plan Discussed with: CRNA, Anesthesiologist and Surgeon  Anesthesia Plan Comments: (R. Inguinal Hernia CAD/AS S/P inferior wall MI 6/11 CABG/AVR 04/30/10 EF 55% Chronic Afib on coumadin, now on lovenox bridge, INR 1.36 Htn  Plan GA with LMA  Kipp Brood, MD)       Anesthesia Quick Evaluation

## 2013-08-25 NOTE — Anesthesia Procedure Notes (Signed)
Procedure Name: LMA Insertion Date/Time: 08/25/2013 8:39 AM Performed by: Charm Barges, Rhyen Mazariego R Pre-anesthesia Checklist: Patient identified, Emergency Drugs available, Suction available, Patient being monitored and Timeout performed Patient Re-evaluated:Patient Re-evaluated prior to inductionOxygen Delivery Method: Circle system utilized Preoxygenation: Pre-oxygenation with 100% oxygen Intubation Type: IV induction Ventilation: Mask ventilation without difficulty LMA: LMA inserted LMA Size: 5.0 Tube secured with: Tape Dental Injury: Teeth and Oropharynx as per pre-operative assessment

## 2013-08-25 NOTE — Interval H&P Note (Signed)
History and Physical Interval Note: no change in H and P  08/25/2013 7:48 AM  [name]  has presented today for surgery, with the diagnosis of inguinal hernia   The various methods of treatment have been discussed with the patient and family. After consideration of risks, benefits and other options for treatment, the patient has consented to  Procedure(s): HERNIA REPAIR INGUINAL ADULT (Right) INSERTION OF MESH (Right) as a surgical intervention .  The patient's history has been reviewed, patient examined, no change in status, stable for surgery.  I have reviewed the patient's chart and labs.  Questions were answered to the patient's satisfaction.     Duell Holdren A

## 2013-08-25 NOTE — Op Note (Signed)
HERNIA REPAIR INGUINAL ADULT, INSERTION OF MESH  Procedure Note  [name] 08/25/2013   Pre-op Diagnosis: RIGHT INGUINAL HERNIA     Post-op Diagnosis: same  Procedure(s): HERNIA REPAIR INGUINAL ADULT INSERTION OF MESH  Surgeon(s): Shelly Rubenstein, MD  Anesthesia: General  Staff:  Circulator: Gerre Pebbles Sipsis, RN Scrub Person: Lina Sayre, RN; Denese Killings, CST Circulator Assistant: Lina Sayre, RN; Jani Files, RN  Estimated Blood Loss: Minimal                         [name]   Date: 08/25/2013  Time: 9:16 AM

## 2013-08-25 NOTE — Telephone Encounter (Signed)
The pharmacy called to clarify the instructions for Oxycodone.  Dr Magnus Ivan wrote for 1 tablet but in parenthesis it had 1-2.  I told her to authorize 1-2 tablets q 4prn.

## 2013-08-25 NOTE — Anesthesia Postprocedure Evaluation (Signed)
  Anesthesia Post-op Note  Patient: [name]  Procedure(s) Performed: Procedure(s): HERNIA REPAIR INGUINAL ADULT (Right) INSERTION OF MESH (Right)  Patient Location: PACU  Anesthesia Type:General  Level of Consciousness: awake, alert  and oriented  Airway and Oxygen Therapy: Patient Spontanous Breathing and Patient connected to nasal cannula oxygen  Post-op Pain: mild  Post-op Assessment: Post-op Vital signs reviewed, Patient's Cardiovascular Status Stable, Respiratory Function Stable and Pain level controlled  Post-op Vital Signs: stable  Complications: No apparent anesthesia complications

## 2013-08-25 NOTE — Preoperative (Signed)
Beta Blockers   Reason not to administer Beta Blockers:Metoprolol given at 0530 hrs on 08/25/2013

## 2013-08-27 ENCOUNTER — Encounter (HOSPITAL_COMMUNITY): Payer: Self-pay | Admitting: Surgery

## 2013-08-30 ENCOUNTER — Ambulatory Visit (INDEPENDENT_AMBULATORY_CARE_PROVIDER_SITE_OTHER): Payer: Medicare Other | Admitting: *Deleted

## 2013-08-30 DIAGNOSIS — I4891 Unspecified atrial fibrillation: Secondary | ICD-10-CM

## 2013-08-30 DIAGNOSIS — Z7901 Long term (current) use of anticoagulants: Secondary | ICD-10-CM

## 2013-08-30 DIAGNOSIS — I359 Nonrheumatic aortic valve disorder, unspecified: Secondary | ICD-10-CM

## 2013-09-06 ENCOUNTER — Ambulatory Visit (INDEPENDENT_AMBULATORY_CARE_PROVIDER_SITE_OTHER): Payer: Medicare Other | Admitting: Surgery

## 2013-09-06 ENCOUNTER — Encounter (INDEPENDENT_AMBULATORY_CARE_PROVIDER_SITE_OTHER): Payer: Self-pay | Admitting: Surgery

## 2013-09-06 VITALS — BP 110/68 | HR 60 | Resp 16 | Ht 70.0 in | Wt 179.2 lb

## 2013-09-06 DIAGNOSIS — Z09 Encounter for follow-up examination after completed treatment for conditions other than malignant neoplasm: Secondary | ICD-10-CM

## 2013-09-06 NOTE — Progress Notes (Signed)
Subjective:     Patient ID: [name], male   DOB: 07-Oct-1946, 67 y.o.   MRN: [id number]  HPI He is here for his first postop visit status post inguinal hernia repair with mesh. He is doing well has no complaints.  Review of Systems     Objective:   Physical Exam On exam, his incision is feeling well with no evidence of recurrence.    Assessment:     Patient stable postop     Plan:     He may slowly resume his activities.  He will refrain from heavy lifting for 2 more weeks. I will see him back as needed

## 2013-09-13 ENCOUNTER — Encounter: Payer: Self-pay | Admitting: *Deleted

## 2013-09-15 ENCOUNTER — Ambulatory Visit (INDEPENDENT_AMBULATORY_CARE_PROVIDER_SITE_OTHER): Payer: Medicare Other | Admitting: *Deleted

## 2013-09-15 DIAGNOSIS — I4891 Unspecified atrial fibrillation: Secondary | ICD-10-CM

## 2013-09-15 DIAGNOSIS — I359 Nonrheumatic aortic valve disorder, unspecified: Secondary | ICD-10-CM

## 2013-09-15 DIAGNOSIS — Z7901 Long term (current) use of anticoagulants: Secondary | ICD-10-CM

## 2013-09-29 ENCOUNTER — Ambulatory Visit: Payer: Medicare Other | Admitting: Podiatry

## 2013-10-05 ENCOUNTER — Other Ambulatory Visit: Payer: Self-pay | Admitting: *Deleted

## 2013-10-05 MED ORDER — WARFARIN SODIUM 5 MG PO TABS: 5.0000 mg | ORAL_TABLET | Freq: Every day | ORAL | Status: DC

## 2013-10-06 ENCOUNTER — Ambulatory Visit (INDEPENDENT_AMBULATORY_CARE_PROVIDER_SITE_OTHER): Payer: Medicare Other | Admitting: *Deleted

## 2013-10-06 DIAGNOSIS — I359 Nonrheumatic aortic valve disorder, unspecified: Secondary | ICD-10-CM

## 2013-10-06 DIAGNOSIS — I4891 Unspecified atrial fibrillation: Secondary | ICD-10-CM

## 2013-10-06 DIAGNOSIS — Z7901 Long term (current) use of anticoagulants: Secondary | ICD-10-CM

## 2013-10-06 LAB — POCT INR: INR: 2.9

## 2013-10-12 ENCOUNTER — Telehealth: Payer: Self-pay | Admitting: Cardiology

## 2013-10-12 ENCOUNTER — Telehealth: Payer: Self-pay | Admitting: *Deleted

## 2013-10-12 NOTE — Telephone Encounter (Signed)
Called pt.  He called surgeon's office this afternoon and they will let him know tomorrow if he needs to come off coumadin.  He will call me back tomorrow and let me know.  Will schedule Lovenox bridge if pt has to stop coumadin.

## 2013-10-12 NOTE — Telephone Encounter (Signed)
LMOM for pt to call surgeon and see if he needs pt to come off coumadin for procedure.  Instructed pt to call me back after talking with surgeon's office

## 2013-10-12 NOTE — Telephone Encounter (Signed)
New Problem:  Pt states he is having elbow surgery on 10/18/13 and wants to know about his coumadin and levanox medication. Pt is requesting a call back.

## 2013-10-12 NOTE — Telephone Encounter (Signed)
PT states that he is scheduled to have a nerve repaired in his arm on Monday 10/18/13 and needs to know if he needs to adjust coumadin for it. States that it is minor and will just require a couple stitches.

## 2013-10-13 ENCOUNTER — Encounter (HOSPITAL_BASED_OUTPATIENT_CLINIC_OR_DEPARTMENT_OTHER): Payer: Self-pay | Admitting: *Deleted

## 2013-10-13 NOTE — Progress Notes (Signed)
Called and talked to lynn-stop coumadin-go on lovenox-pt will call to get lovenox-he has done this 10/14 for hernia surgery. Will ck labs 12/5

## 2013-10-13 NOTE — Progress Notes (Signed)
Waiting to find out about coumadin hold for surgery-will call dr Carmela Rima go to [facility] 12/5/ for bmet-pt ptt Will see if pt needs to be repeated Had hernia surgery 10/14-back playing golf

## 2013-10-14 ENCOUNTER — Encounter (HOSPITAL_COMMUNITY)
Admission: RE | Admit: 2013-10-14 | Discharge: 2013-10-14 | Disposition: A | Discharge status: Home / Self Care | Payer: Medicare Other | Source: Ambulatory Visit | Attending: Orthopedic Surgery | Admitting: Orthopedic Surgery

## 2013-10-14 ENCOUNTER — Ambulatory Visit (INDEPENDENT_AMBULATORY_CARE_PROVIDER_SITE_OTHER): Payer: Medicare Other | Admitting: *Deleted

## 2013-10-14 DIAGNOSIS — Z7901 Long term (current) use of anticoagulants: Secondary | ICD-10-CM

## 2013-10-14 DIAGNOSIS — I359 Nonrheumatic aortic valve disorder, unspecified: Secondary | ICD-10-CM

## 2013-10-14 DIAGNOSIS — Z01818 Encounter for other preprocedural examination: Secondary | ICD-10-CM | POA: Insufficient documentation

## 2013-10-14 DIAGNOSIS — Z01812 Encounter for preprocedural laboratory examination: Secondary | ICD-10-CM | POA: Insufficient documentation

## 2013-10-14 DIAGNOSIS — I4891 Unspecified atrial fibrillation: Secondary | ICD-10-CM

## 2013-10-14 LAB — APTT: aPTT: 60 seconds — ABNORMAL HIGH (ref 24–37)

## 2013-10-14 LAB — BASIC METABOLIC PANEL
BUN: 20 mg/dL (ref 6–23)
CO2: 26 mEq/L (ref 19–32)
Chloride: 95 mEq/L — ABNORMAL LOW (ref 96–112)
Creatinine, Ser: 1 mg/dL (ref 0.50–1.35)
GFR calc non Af Amer: 75 mL/min — ABNORMAL LOW (ref >90)
Glucose, Bld: 127 mg/dL — ABNORMAL HIGH (ref 70–99)
Potassium: 4.1 mEq/L (ref 3.5–5.1)
Sodium: 131 mEq/L — ABNORMAL LOW (ref 135–145)

## 2013-10-14 LAB — POCT INR: INR: 3.1

## 2013-10-14 LAB — PROTIME-INR: INR: 2.82 — ABNORMAL HIGH (ref 0.00–1.49)

## 2013-10-14 NOTE — Patient Instructions (Signed)
12/3  Last dose coumadin 12/4  No lovenox or coumadin 12/5  lovenox 120mg  8am 12/6   lovenox 120mg  8am 12/7   lovenox 120mg  8am 12/8   No lovenox in am------surgery-----lovenox 120mg  8pm and coumadin 10mg  12/9   lovenox 120mg  8pm and coumadin 10mg  12/10  lovenox 120mg  8pm and coumadin 10mg  12/11  lovenox 120mg  8pm and coumadin 10mg  12/12  lovenox 120mg  8pm and coumadin 5mg  12/13  lovenox 120mg  8pm and coumadin 10mg  12/14  lovenox 120mg  8pm and coumadin 10mg  12/15  INR appt

## 2013-10-15 ENCOUNTER — Encounter (HOSPITAL_COMMUNITY): Admission: RE | Admit: 2013-10-15 | Payer: Medicare Other | Source: Ambulatory Visit

## 2013-10-15 ENCOUNTER — Other Ambulatory Visit: Payer: Self-pay | Admitting: Orthopedic Surgery

## 2013-10-15 NOTE — Progress Notes (Signed)
Pt was to do labs 10/15/13 for inr-went 10/14/13-inr was 3.1 Will need to re-ck Monday-pt told to come in 1245 to do stat inr

## 2013-10-15 NOTE — Progress Notes (Signed)
Chart reviewed with Dr Ivin Booty. Ok for [facility]

## 2013-10-18 ENCOUNTER — Encounter (HOSPITAL_BASED_OUTPATIENT_CLINIC_OR_DEPARTMENT_OTHER)
Admission: RE | Disposition: A | Discharge status: Home / Self Care | Payer: Self-pay | Source: Ambulatory Visit | Attending: Orthopedic Surgery

## 2013-10-18 ENCOUNTER — Ambulatory Visit (HOSPITAL_BASED_OUTPATIENT_CLINIC_OR_DEPARTMENT_OTHER)
Admission: RE | Admit: 2013-10-18 | Discharge: 2013-10-18 | Disposition: A | Discharge status: Home / Self Care | Payer: Medicare Other | Source: Ambulatory Visit | Attending: Orthopedic Surgery | Admitting: Orthopedic Surgery

## 2013-10-18 ENCOUNTER — Ambulatory Visit (HOSPITAL_BASED_OUTPATIENT_CLINIC_OR_DEPARTMENT_OTHER): Payer: Medicare Other | Admitting: Anesthesiology

## 2013-10-18 ENCOUNTER — Encounter (HOSPITAL_BASED_OUTPATIENT_CLINIC_OR_DEPARTMENT_OTHER): Payer: Self-pay | Admitting: *Deleted

## 2013-10-18 ENCOUNTER — Encounter (HOSPITAL_BASED_OUTPATIENT_CLINIC_OR_DEPARTMENT_OTHER): Payer: Medicare Other | Admitting: Anesthesiology

## 2013-10-18 DIAGNOSIS — I252 Old myocardial infarction: Secondary | ICD-10-CM | POA: Insufficient documentation

## 2013-10-18 DIAGNOSIS — K5730 Diverticulosis of large intestine without perforation or abscess without bleeding: Secondary | ICD-10-CM | POA: Insufficient documentation

## 2013-10-18 DIAGNOSIS — I2510 Atherosclerotic heart disease of native coronary artery without angina pectoris: Secondary | ICD-10-CM | POA: Insufficient documentation

## 2013-10-18 DIAGNOSIS — Z87891 Personal history of nicotine dependence: Secondary | ICD-10-CM | POA: Insufficient documentation

## 2013-10-18 DIAGNOSIS — G5600 Carpal tunnel syndrome, unspecified upper limb: Secondary | ICD-10-CM | POA: Insufficient documentation

## 2013-10-18 DIAGNOSIS — M109 Gout, unspecified: Secondary | ICD-10-CM | POA: Insufficient documentation

## 2013-10-18 DIAGNOSIS — I4891 Unspecified atrial fibrillation: Secondary | ICD-10-CM | POA: Insufficient documentation

## 2013-10-18 DIAGNOSIS — Z79899 Other long term (current) drug therapy: Secondary | ICD-10-CM | POA: Insufficient documentation

## 2013-10-18 DIAGNOSIS — J449 Chronic obstructive pulmonary disease, unspecified: Secondary | ICD-10-CM | POA: Insufficient documentation

## 2013-10-18 DIAGNOSIS — Z952 Presence of prosthetic heart valve: Secondary | ICD-10-CM | POA: Insufficient documentation

## 2013-10-18 DIAGNOSIS — J4489 Other specified chronic obstructive pulmonary disease: Secondary | ICD-10-CM | POA: Insufficient documentation

## 2013-10-18 DIAGNOSIS — M1990 Unspecified osteoarthritis, unspecified site: Secondary | ICD-10-CM | POA: Insufficient documentation

## 2013-10-18 DIAGNOSIS — Z96659 Presence of unspecified artificial knee joint: Secondary | ICD-10-CM | POA: Insufficient documentation

## 2013-10-18 DIAGNOSIS — G5620 Lesion of ulnar nerve, unspecified upper limb: Secondary | ICD-10-CM | POA: Insufficient documentation

## 2013-10-18 HISTORY — DX: Presence of spectacles and contact lenses: Z97.3

## 2013-10-18 HISTORY — PX: ULNAR NERVE TRANSPOSITION: SHX2595

## 2013-10-18 HISTORY — PX: CARPAL TUNNEL RELEASE: SHX101

## 2013-10-18 LAB — PROTIME-INR
INR: 1.02 (ref 0.00–1.49)
Prothrombin Time: 13.2 seconds (ref 11.6–15.2)

## 2013-10-18 LAB — POCT HEMOGLOBIN-HEMACUE: Hemoglobin: 10.9 g/dL — ABNORMAL LOW (ref 13.0–17.0)

## 2013-10-18 SURGERY — CARPAL TUNNEL RELEASE
Anesthesia: Regional | Site: Wrist | Laterality: Right

## 2013-10-18 MED ORDER — MIDAZOLAM HCL 2 MG/2ML IJ SOLN
INTRAMUSCULAR | Status: AC
  Filled 2013-10-18: qty 2

## 2013-10-18 MED ORDER — FENTANYL CITRATE 0.05 MG/ML IJ SOLN
INTRAMUSCULAR | Status: AC
  Filled 2013-10-18: qty 4

## 2013-10-18 MED ORDER — DEXAMETHASONE SODIUM PHOSPHATE 10 MG/ML IJ SOLN
INTRAMUSCULAR | Status: DC | PRN
  Administered 2013-10-18: 10 mg via INTRAVENOUS

## 2013-10-18 MED ORDER — CEFAZOLIN SODIUM-DEXTROSE 2-3 GM-% IV SOLR
2.0000 g | INTRAVENOUS | Status: AC
  Administered 2013-10-18: 2 g via INTRAVENOUS

## 2013-10-18 MED ORDER — LIDOCAINE HCL (CARDIAC) 20 MG/ML IV SOLN
INTRAVENOUS | Status: DC | PRN
  Administered 2013-10-18: 50 mg via INTRAVENOUS

## 2013-10-18 MED ORDER — PROPOFOL INFUSION 10 MG/ML OPTIME
INTRAVENOUS | Status: DC | PRN
  Administered 2013-10-18: 100 ug/kg/min via INTRAVENOUS

## 2013-10-18 MED ORDER — FENTANYL CITRATE 0.05 MG/ML IJ SOLN
INTRAMUSCULAR | Status: AC
  Filled 2013-10-18: qty 2

## 2013-10-18 MED ORDER — HYDROCODONE-ACETAMINOPHEN 5-325 MG PO TABS: 1.0000 | ORAL_TABLET | Freq: Four times a day (QID) | ORAL | Status: DC | PRN

## 2013-10-18 MED ORDER — CHLORHEXIDINE GLUCONATE 4 % EX LIQD: 60.0000 mL | Freq: Once | CUTANEOUS | Status: DC

## 2013-10-18 MED ORDER — LIDOCAINE HCL (PF) 1 % IJ SOLN
INTRAMUSCULAR | Status: AC
  Filled 2013-10-18: qty 30

## 2013-10-18 MED ORDER — BUPIVACAINE HCL (PF) 0.25 % IJ SOLN
INTRAMUSCULAR | Status: AC
  Filled 2013-10-18: qty 30

## 2013-10-18 MED ORDER — BUPIVACAINE-EPINEPHRINE PF 0.5-1:200000 % IJ SOLN
INTRAMUSCULAR | Status: DC | PRN
  Administered 2013-10-18: 30 mL via PERINEURAL

## 2013-10-18 MED ORDER — HYDROMORPHONE HCL PF 1 MG/ML IJ SOLN: 0.2500 mg | INTRAMUSCULAR | Status: DC | PRN

## 2013-10-18 MED ORDER — FENTANYL CITRATE 0.05 MG/ML IJ SOLN
50.0000 ug | INTRAMUSCULAR | Status: DC | PRN
  Administered 2013-10-18: 50 ug via INTRAVENOUS

## 2013-10-18 MED ORDER — PROPOFOL 10 MG/ML IV BOLUS
INTRAVENOUS | Status: AC
  Filled 2013-10-18: qty 20

## 2013-10-18 MED ORDER — BUPIVACAINE HCL (PF) 0.5 % IJ SOLN
INTRAMUSCULAR | Status: DC | PRN
  Administered 2013-10-18: 8 mL

## 2013-10-18 MED ORDER — LACTATED RINGERS IV SOLN
INTRAVENOUS | Status: DC
  Administered 2013-10-18: 13:00:00 via INTRAVENOUS

## 2013-10-18 MED ORDER — FENTANYL CITRATE 0.05 MG/ML IJ SOLN
INTRAMUSCULAR | Status: DC | PRN
  Administered 2013-10-18 (×2): 50 ug via INTRAVENOUS

## 2013-10-18 MED ORDER — MIDAZOLAM HCL 5 MG/5ML IJ SOLN
INTRAMUSCULAR | Status: DC | PRN
  Administered 2013-10-18 (×2): 1 mg via INTRAVENOUS

## 2013-10-18 SURGICAL SUPPLY — 53 items
BLADE MINI RND TIP GREEN BEAV (BLADE) IMPLANT
BLADE SURG 15 STRL LF DISP TIS (BLADE) ×2 IMPLANT
BLADE SURG 15 STRL SS (BLADE) ×3
BNDG CMPR 9X4 STRL LF SNTH (GAUZE/BANDAGES/DRESSINGS) ×1
BNDG COHESIVE 3X5 TAN STRL LF (GAUZE/BANDAGES/DRESSINGS) ×3 IMPLANT
BNDG ESMARK 4X9 LF (GAUZE/BANDAGES/DRESSINGS) ×3 IMPLANT
BNDG GAUZE ELAST 4 BULKY (GAUZE/BANDAGES/DRESSINGS) ×3 IMPLANT
CHLORAPREP W/TINT 26ML (MISCELLANEOUS) ×3 IMPLANT
CORDS BIPOLAR (ELECTRODE) ×3 IMPLANT
COVER MAYO STAND STRL (DRAPES) ×3 IMPLANT
COVER TABLE BACK 60X90 (DRAPES) ×3 IMPLANT
CUFF TOURNIQUET SINGLE 18IN (TOURNIQUET CUFF) ×3 IMPLANT
DECANTER SPIKE VIAL GLASS SM (MISCELLANEOUS) IMPLANT
DRAPE EXTREMITY T 121X128X90 (DRAPE) ×3 IMPLANT
DRAPE SURG 17X23 STRL (DRAPES) ×3 IMPLANT
DRSG KUZMA FLUFF (GAUZE/BANDAGES/DRESSINGS) ×3 IMPLANT
GAUZE SPONGE 4X4 16PLY XRAY LF (GAUZE/BANDAGES/DRESSINGS) IMPLANT
GAUZE XEROFORM 1X8 LF (GAUZE/BANDAGES/DRESSINGS) ×3 IMPLANT
GLOVE BIO SURGEON STRL SZ 6.5 (GLOVE) IMPLANT
GLOVE BIOGEL PI IND STRL 7.0 (GLOVE) ×2 IMPLANT
GLOVE BIOGEL PI IND STRL 8.5 (GLOVE) ×2 IMPLANT
GLOVE BIOGEL PI INDICATOR 7.0 (GLOVE) ×1
GLOVE BIOGEL PI INDICATOR 8.5 (GLOVE) ×1
GLOVE ECLIPSE 6.5 STRL STRAW (GLOVE) ×3 IMPLANT
GLOVE SURG ORTHO 8.0 STRL STRW (GLOVE) ×3 IMPLANT
GOWN BRE IMP PREV XXLGXLNG (GOWN DISPOSABLE) ×3 IMPLANT
GOWN PREVENTION PLUS XLARGE (GOWN DISPOSABLE) ×3 IMPLANT
LOOP VESSEL MAXI BLUE (MISCELLANEOUS) IMPLANT
NDL SUT 6 .5 CRC .975X.05 MAYO (NEEDLE) IMPLANT
NEEDLE 27GAX1X1/2 (NEEDLE) IMPLANT
NEEDLE MAYO TAPER (NEEDLE)
NS IRRIG 1000ML POUR BTL (IV SOLUTION) ×3 IMPLANT
PACK BASIN DAY SURGERY FS (CUSTOM PROCEDURE TRAY) ×3 IMPLANT
PAD ABD 8X10 STRL (GAUZE/BANDAGES/DRESSINGS) ×3 IMPLANT
PAD CAST 3X4 CTTN HI CHSV (CAST SUPPLIES) ×2 IMPLANT
PAD CAST 4YDX4 CTTN HI CHSV (CAST SUPPLIES) ×2 IMPLANT
PADDING CAST ABS 4INX4YD NS (CAST SUPPLIES) ×1
PADDING CAST ABS COTTON 4X4 ST (CAST SUPPLIES) ×2 IMPLANT
PADDING CAST COTTON 3X4 STRL (CAST SUPPLIES) ×2
PADDING CAST COTTON 4X4 STRL (CAST SUPPLIES) ×3
SLEEVE SCD COMPRESS KNEE MED (MISCELLANEOUS) ×3 IMPLANT
SPLINT PLASTER CAST XFAST 3X15 (CAST SUPPLIES) IMPLANT
SPLINT PLASTER XTRA FASTSET 3X (CAST SUPPLIES)
SPONGE GAUZE 4X4 12PLY (GAUZE/BANDAGES/DRESSINGS) ×3 IMPLANT
STOCKINETTE 4X48 STRL (DRAPES) ×3 IMPLANT
SUT VIC AB 2-0 SH 27 (SUTURE) ×3
SUT VIC AB 2-0 SH 27XBRD (SUTURE) ×2 IMPLANT
SUT VICRYL 4-0 PS2 18IN ABS (SUTURE) IMPLANT
SUT VICRYL RAPIDE 4/0 PS 2 (SUTURE) ×3 IMPLANT
SYR BULB 3OZ (MISCELLANEOUS) ×3 IMPLANT
SYR CONTROL 10ML LL (SYRINGE) IMPLANT
TOWEL OR 17X24 6PK STRL BLUE (TOWEL DISPOSABLE) ×3 IMPLANT
UNDERPAD 30X30 INCONTINENT (UNDERPADS AND DIAPERS) ×3 IMPLANT

## 2013-10-18 NOTE — Anesthesia Preprocedure Evaluation (Addendum)
Anesthesia Evaluation  Patient identified by MRN, date of birth, ID band Patient awake    Reviewed: Allergy & Precautions, H&P , NPO status , Patient's Chart, lab work & pertinent test results, reviewed documented beta blocker date and time   Airway Mallampati: II TM Distance: >3 FB Neck ROM: Full    Dental no notable dental hx. (+) Teeth Intact and Dental Advisory Given   Pulmonary COPDformer smoker,  breath sounds clear to auscultation  Pulmonary exam normal       Cardiovascular + CAD and + Past MI + dysrhythmias Atrial Fibrillation Rhythm:Regular Rate:Normal  S/p AVR   Neuro/Psych negative neurological ROS  negative psych ROS   GI/Hepatic negative GI ROS, Neg liver ROS,   Endo/Other  negative endocrine ROS  Renal/GU negative Renal ROS  negative genitourinary   Musculoskeletal   Abdominal   Peds  Hematology negative hematology ROS (+)   Anesthesia Other Findings   Reproductive/Obstetrics negative OB ROS                          Anesthesia Physical Anesthesia Plan  ASA: III  Anesthesia Plan: Regional and MAC   Post-op Pain Management:    Induction: Intravenous  Airway Management Planned: LMA  Additional Equipment:   Intra-op Plan:   Post-operative Plan: Extubation in OR  Informed Consent: I have reviewed the patients History and Physical, chart, labs and discussed the procedure including the risks, benefits and alternatives for the proposed anesthesia with the patient or authorized representative who has indicated his/her understanding and acceptance.   Dental advisory given  Plan Discussed with: CRNA  Anesthesia Plan Comments:        Anesthesia Quick Evaluation

## 2013-10-18 NOTE — Transfer of Care (Signed)
Immediate Anesthesia Transfer of Care Note  Patient: [name]  Procedure(s) Performed: Procedure(s): RIGHT CARPAL TUNNEL RELEASE (Right) RIGHT ULNAR NERVE DECOMPRESSION/POSSIBLE TRANSPOSITION (Right)  Patient Location: PACU  Anesthesia Type:MAC and Regional  Level of Consciousness: awake, alert  and oriented  Airway & Oxygen Therapy: Patient Spontanous Breathing and Patient connected to face mask oxygen  Post-op Assessment: Report given to PACU RN and Post -op Vital signs reviewed and stable  Post vital signs: Reviewed and stable  Complications: No apparent anesthesia complications

## 2013-10-18 NOTE — Op Note (Signed)
Dictation Number [id number]

## 2013-10-18 NOTE — Anesthesia Procedure Notes (Signed)
Anesthesia Regional Block:  Supraclavicular block  Pre-Anesthetic Checklist: ,, timeout performed, Correct Patient, Correct Site, Correct Laterality, Correct Procedure, Correct Position, site marked, Risks and benefits discussed, pre-op evaluation, post-op pain management  Laterality: Right  Prep: Maximum Sterile Barrier Precautions used and chloraprep       Needles:  Injection technique: Single-shot  Needle Type: Echogenic Stimulator Needle     Needle Length: 5cm 5 cm Needle Gauge: 22 and 22 G    Additional Needles:  Procedures: ultrasound guided (picture in chart) Supraclavicular block Narrative:  Start time: 10/18/2013 2:10 PM End time: 10/18/2013 2:21 PM Injection made incrementally with aspirations every 5 mL. Anesthesiologist: Shivon Hackel,MD  Additional Notes: 2% Lidocaine skin wheel. Intercostobrachial block with 8cc of 0.5% Bupivicaine plain.

## 2013-10-18 NOTE — Anesthesia Postprocedure Evaluation (Signed)
  Anesthesia Post-op Note  Patient: [name]  Procedure(s) Performed: Procedure(s): RIGHT CARPAL TUNNEL RELEASE (Right) RIGHT ULNAR NERVE DECOMPRESSION/POSSIBLE TRANSPOSITION (Right)  Patient Location: PACU  Anesthesia Type: MAC and Block  Level of Consciousness: awake and alert   Airway and Oxygen Therapy: Patient Spontanous Breathing  Post-op Pain: none  Post-op Assessment: Post-op Vital signs reviewed, Patient's Cardiovascular Status Stable and Respiratory Function Stable  Post-op Vital Signs: Reviewed  Filed Vitals:   10/18/13 1553  BP: 98/64  Pulse: 79  Temp: 36.6 C  Resp: 13    Complications: No apparent anesthesia complications

## 2013-10-18 NOTE — Progress Notes (Signed)
chl Assisted Dr. Sampson Goon with right, ultrasound guided, supraclavicular block. Side rails up, monitors on throughout procedure. See vital signs in flow sheet. Tolerated Procedure well.

## 2013-10-18 NOTE — Brief Op Note (Signed)
10/18/2013  3:49 PM  PATIENT:  [name]  67 y.o. 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  POST-OPERATIVE DIAGNOSIS:  Right Carpal and Cubital Tunnel Syndrome  PROCEDURE:  Procedure(s): RIGHT CARPAL TUNNEL RELEASE (Right) RIGHT ULNAR NERVE DECOMPRESSION/POSSIBLE TRANSPOSITION (Right)  SURGEON:  Surgeon(s) and Role:    * Nicki Reaper, MD - Primary  PHYSICIAN ASSISTANT:   ASSISTANTS: none   ANESTHESIA:   regional and general  EBL:  Total I/O In: 600 [I.V.:600] Out: -   BLOOD ADMINISTERED:none  DRAINS: none   LOCAL MEDICATIONS USED:  NONE  SPECIMEN:  No Specimen  DISPOSITION OF SPECIMEN:  N/A  COUNTS:  YES  TOURNIQUET:   Total Tourniquet Time Documented: Upper Arm (Right) - 28 minutes Total: Upper Arm (Right) - 28 minutes   DICTATION: .Other Dictation: Dictation Number [id number]  PLAN OF CARE: Discharge to home after PACU  PATIENT DISPOSITION:  PACU - hemodynamically stable.

## 2013-10-18 NOTE — H&P (Signed)
[name] is a 67 year old right hand dominant male with pain and swelling in his right hand, decreased strength. He has no history of injury. He complains of discomfort at the ulnar side of his wrist. He has a history of arthritis and gout. He is on Allopurinol and Colcrys which he has not been taking. He has no history of new injury. 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 He complains of constant, severe, throbbing, aching pain with a feeling of swelling and numbness in his hand. He has been taking extra strength Tylenol. He recently had a Medrol Dosepak which gave him good relief but it flared afterwards. He was treated by Dr. Renard Matter.He has had his nerve conductions done and this reveals both carpal tunnel and cubital tunnel syndrome bilaterally, slightly greater right than left.  He does show moderate atrophy of the intrinsics on his left side and as such we would recommend treating his right side.    PAST MEDICAL HISTORY: He has no known drug allergies. He is on the following medications: Tylenol, allopurinol, amoxicillin, Colcrys, enoxaparin, lisinopril, metoprolol and warfarin. He has had a heart valve replacement in 2011, knee surgery by Dr. Thurston Hole in 2012 and 2013, and a hernia repair in 2014.  FAMILY H ISTORY: Positive for arthritis.  SOCIAL HISTORY: He does not smoke. He drinks socially. He is married and retired.   REVIEW OF SYSTEMS: Positive for heart attack with heart valve replacement, easy bruising, otherwise negative. OLEN Galloway is an 67 y.o. male.   Chief Complaint: rt cts and cubital tunnel  HPI: see above  Past Medical History  Diagnosis Date  . Aortic stenosis     a. s/p bioprosth AVR 04/2010;  b. 05/2012 Echo: EF 50-55%, nl bioprosth fxn w/ gradient, mold dil LA.  Marland Kitchen Gout   . History of pneumonia   . Diverticulosis   . CAD (coronary artery disease)     a. s/p MI/VF arrest 04/2010 w/ cath showing OM1 and LPDA a occlusions 2/2 thrombus in.  Cors otw nonobs.  .  Ventricular fibrillation     a. VR arrest in setting of thrombotic MI 04/2010.  . Atrial fibrillation or flutter     a. s/p Cox-Maze @ time of AVR;  b. Previously on Amio and s/p DCCV x 2 ->failed->now rate controlled and on chronic coumadin.  . Thrombus of left atrial appendage Hx of -     a. 04/2010  . Myocardial infarction   . COPD (chronic obstructive pulmonary disease)     bronchitis HX  . DJD (degenerative joint disease)     a. s/p R TKA 2010;  b. s/p L TKA 2013.  . Wears glasses     reading    Past Surgical History  Procedure Laterality Date  . Right ankle pinning      Fell from tree w/fracture several years ago.  . Right total knee replacement  04/02/2011  . 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 Retinal laser procedure      right eye   . Total knee arthroplasty  07/20/2012    Procedure: TOTAL KNEE ARTHROPLASTY;  Surgeon: Nilda Simmer, MD;  Location: [facility] OR;  Service: Orthopedics;  Laterality: Left;  left total knee arthroplasty  . Inguinal hernia repair Right 08/25/2013    Procedure: HERNIA REPAIR INGUINAL ADULT;  Surgeon: Shelly Rubenstein, MD;  Location: [facility] OR;  Service: General;  Laterality: Right;  . Insertion of mesh  Right 08/25/2013    Procedure: INSERTION OF MESH;  Surgeon: Shelly Rubenstein, MD;  Location: [facility] OR;  Service: General;  Laterality: Right;  . Colonoscopy      History reviewed. No pertinent family history. Social History:  reports that he quit smoking about 3 years ago. His smoking use included Cigars. He has never used smokeless tobacco. He reports that he drinks alcohol. He reports that he does not use illicit drugs.  Allergies:  Allergies  Allergen Reactions  . Diclofenac Itching  . Naproxen     itching    Medications Prior to Admission  Medication Sig Dispense Refill  . acetaminophen (TYLENOL) 500 MG tablet Take 1,000 mg by mouth daily as needed (arthritis pain). For knee pain.      Marland Kitchen allopurinol  (ZYLOPRIM) 100 MG tablet Take 100 mg by mouth daily as needed (for gout).       . COLCRYS 0.6 MG tablet Take 0.6 mg by mouth daily as needed. For gout flare up      . lisinopril (PRINIVIL,ZESTRIL) 5 MG tablet Take 5 mg by mouth daily.      . metoprolol succinate (TOPROL-XL) 50 MG 24 hr tablet Take 50 mg by mouth 2 (two) times daily. Take with or immediately following a meal.      . warfarin (COUMADIN) 5 MG tablet Take 1-2 tablets (5-10 mg total) by mouth daily. Take 10mg  daily except 5mg  on mondays and fridays  60 tablet  3    No results found for this or any previous visit (from the past 48 hour(s)).  No results found.   Pertinent items are noted in HPI.  Height 5\' 10"  (1.778 m), weight 179 lb (81.194 kg).  General appearance: alert, cooperative and appears stated age Head: Normocephalic, without obvious abnormality Neck: no JVD Resp: clear to auscultation bilaterally Cardio: regular rate and rhythm, S1, S2 normal, no murmur, click, rub or gallop GI: soft, non-tender; bowel sounds normal; no masses,  no organomegaly Extremities: extremities normal, atraumatic, no cyanosis or edema Pulses: 2+ and symmetric Skin: Skin color, texture, turgor normal. No rashes or lesions Neurologic: Grossly normal Incision/Wound: na  Assessment/Plan DX: CTS and Cubital tunnel rt.   We discussed the possibility of surgical decompression of the nerve.  The pre, peri and postoperative course were discussed along with the risks and complications.  The patient is aware there is no guarantee with the surgery, possibility of infection, recurrence, injury to arteries, nerves, tendons, incomplete relief of symptoms and dystrophy.    He is scheduled for carpal tunnel syndrome release right side, cubital tunnel decompression possible transposition right side.  Noted uric acid level was normal.  He has decided to proceed to have this done.  This will be done as an outpatient under regional anesthesia.  Rennee Coyne  R 10/18/2013, 1:04 PM

## 2013-10-20 ENCOUNTER — Encounter (HOSPITAL_BASED_OUTPATIENT_CLINIC_OR_DEPARTMENT_OTHER): Payer: Self-pay | Admitting: Orthopedic Surgery

## 2013-10-21 NOTE — Op Note (Signed)
NAMEHARLESS, MOLINARI NO.:  [id number]  MEDICAL RECORD NO.:  [id number]  LOCATION:                               FACILITY:  [facility]  PHYSICIAN:  Cindee Salt, M.D.       DATE OF BIRTH:  [birth year]  DATE OF PROCEDURE:  10/18/2013 DATE OF DISCHARGE:  10/18/2013                              OPERATIVE REPORT   PREOPERATIVE DIAGNOSES:  Carpal tunnel syndrome, right hand; cubital tunnel syndrome, right elbow.  POSTOPERATIVE DIAGNOSES:  Carpal tunnel syndrome, right hand; cubital tunnel syndrome, right elbow.  OPERATION:  Decompression of median nerve at the wrist, ulnar nerve at the elbow, right side.  SURGEON:  Cindee Salt, M.D.  ANESTHESIA:  Supraclavicular block and general.  ANESTHESIOLOGIST:  W. Autumn Patty, MD.  HISTORY:  The patient is a 67 year old male with a history of numbness and tingling in his right hand.  Nerve conductions reveal a carpal tunnel syndrome on his right side.  He also has atrophy.  The intrinsics nerve conductions are positive for both carpal tunnel and cubital tunnel syndrome on his right side.  He has elected to undergo surgical decompression in that conservative treatment has not resolved any symptoms.  He is aware of risks and complications including infection, recurrence of injury to arteries, nerves, tendons, incomplete relief of symptoms, dystrophy.  In the preoperative area, the patient is seen, the extremity marked by both the patient and surgeon.  Antibiotic given.  PROCEDURE IN DETAIL:  The patient was brought to the operating room where a supraclavicular block and general anesthetic were carried out under the direction of Dr. Sampson Goon.  He was prepped using ChloraPrep in supine position, right arm free.  A 3-minute dry time was allowed. Time-out taken confirming the patient and procedure.  The limb was exsanguinated with an Esmarch bandage.  Tourniquet placed high on the arm and was inflated to 250 mmHg.  The carpal  tunnel was addressed first.  A longitudinal incision made in the right palm, carried down through subcutaneous tissue.  Bleeders were electrocauterized.  Palmar fascia was split.  Superficial palmar arch was identified.  The flexor tendon to the ring little finger identified to the ulnar side of median nerve.  Carpal retinaculum was incised with sharp dissection.  Right angle and Sewall retractor were placed between skin and forearm fascia. The fascia released for approximately 2 cm proximal to the wrist crease under direct vision.  A very significant tenosynovitis was present with significant amount of synovial fluid.  The median nerve was then visualized.  Area compression to the nerve was apparent.  The motor branch entered into muscle.  The wound was irrigated and closed with interrupted 4-0 Vicryl Rapide sutures.  Separate incision was then made over the medial aspect of the right elbow, carried down through subcutaneous tissue.  Bleeders again electrocauterized.  Dissection carried down to Osborne's fascia.  The posterior branches of the medial antebrachial cutaneous nerve of the forearm were identified and protected.  The Osborne fascia was opened on its most posterior aspect. The subcutaneous tissue was then dissected free from the underlying flexor carpi ulnaris tendon muscle belly.  Knee retractors were placed  and a fasciotomy performed.  The muscle belly split, the deep fascia was then released with angled ENT scissors, using a KMI carpal tunnel release guide.  This was done for approximately 5-6 cm distally.  The nerve was then addressed proximally.  The subcutaneous tissue again dissected from the brachial fascia.  The knee retractors placed along with the [facility] guide and the fascia was released proximally for again approximately 5-6 cm.  The nerve did not subluxate with flexion and extension of his elbow.  The wound was copiously irrigated with saline. The Osborne fascia was  then sutured to the posterior skin flap with figure-of-eight 2-0 Vicryl sutures.  Subcutaneous tissue with interrupted 2-0 Vicryl and the skin with a subcuticular 4-0 Vicryl Rapide suture.  A sterile compressive dressing on the wrist and forearm was applied.  On deflation of the tourniquet, all fingers immediately pinked.  He was taken to the recovery room for observation in satisfactory condition.  He will be discharged home to return in 1 week on Norco.          ______________________________ Cindee Salt, M.D.     GK/MEDQ  D:  10/18/2013  T:  10/19/2013  Job:  [id number]

## 2013-10-25 ENCOUNTER — Ambulatory Visit (INDEPENDENT_AMBULATORY_CARE_PROVIDER_SITE_OTHER): Payer: Medicare Other | Admitting: *Deleted

## 2013-10-25 DIAGNOSIS — I4891 Unspecified atrial fibrillation: Secondary | ICD-10-CM

## 2013-10-25 DIAGNOSIS — I359 Nonrheumatic aortic valve disorder, unspecified: Secondary | ICD-10-CM

## 2013-10-25 DIAGNOSIS — Z7901 Long term (current) use of anticoagulants: Secondary | ICD-10-CM

## 2013-10-25 LAB — POCT INR: INR: 1.9

## 2013-11-09 ENCOUNTER — Other Ambulatory Visit: Payer: Self-pay | Admitting: *Deleted

## 2013-11-10 ENCOUNTER — Ambulatory Visit (INDEPENDENT_AMBULATORY_CARE_PROVIDER_SITE_OTHER): Payer: Medicare Other | Admitting: *Deleted

## 2013-11-10 DIAGNOSIS — Z7901 Long term (current) use of anticoagulants: Secondary | ICD-10-CM

## 2013-11-10 DIAGNOSIS — I359 Nonrheumatic aortic valve disorder, unspecified: Secondary | ICD-10-CM

## 2013-11-10 DIAGNOSIS — Z5181 Encounter for therapeutic drug level monitoring: Secondary | ICD-10-CM

## 2013-11-10 DIAGNOSIS — I4891 Unspecified atrial fibrillation: Secondary | ICD-10-CM

## 2013-11-10 LAB — POCT INR: INR: 2.2

## 2013-11-17 ENCOUNTER — Encounter: Payer: Self-pay | Admitting: *Deleted

## 2013-11-17 ENCOUNTER — Encounter: Payer: Self-pay | Admitting: Cardiology

## 2013-11-17 ENCOUNTER — Ambulatory Visit (INDEPENDENT_AMBULATORY_CARE_PROVIDER_SITE_OTHER): Payer: Medicare Other | Admitting: Cardiology

## 2013-11-17 VITALS — BP 116/62 | HR 73 | Ht 70.0 in | Wt 185.0 lb

## 2013-11-17 DIAGNOSIS — I4891 Unspecified atrial fibrillation: Secondary | ICD-10-CM

## 2013-11-17 DIAGNOSIS — I359 Nonrheumatic aortic valve disorder, unspecified: Secondary | ICD-10-CM

## 2013-11-17 MED ORDER — SILDENAFIL CITRATE 50 MG PO TABS: 50.0000 mg | ORAL_TABLET | Freq: Every day | ORAL | Status: DC | PRN

## 2013-11-17 NOTE — Patient Instructions (Signed)
Your physician wants you to follow-up in: July 2015 with Dr Shirlee LatchMcLean.  You will receive a reminder letter in the mail two months in advance. If you don't receive a letter, please call our office to schedule the follow-up appointment.  Your physician has requested that you have an echocardiogram. Echocardiography is a painless test that uses sound waves to create images of your heart. It provides your doctor with information about the size and shape of your heart and how well your heart's chambers and valves are working. This procedure takes approximately one hour. There are no restrictions for this procedure. July 2015 a few days before the appt with Dr Shirlee LatchMcLean.

## 2013-11-17 NOTE — Progress Notes (Signed)
Patient ID: [name], male   DOB: Jun 14, 1946, 68 y.o.   MRN: [id number] PCP: Dr. Renard MatterMcInnis [name]([place])  68 yo with history of atrial fibrillation, cardioembolic MI, and aortic stenosis s/p aortic valve replacement presents for cardiology followup.  Since last appt, he had a right inguinal hernia repair and carpal tunnel release with no complications.  Last echo in 7/13 showed EF 50-55% with well-seated bioprosthetic aortic valve.  No chest pain, no exertional dyspnea.  He remains in atrial fibrillation.  He has good exercise tolerance.  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  He golfs twice a week when the weather is good.   ECG: atrial fibrillation, inferior T wave flattening  Labs (1/12): K 4.7, creatinine 1.1, HDL 71, LDL 72 Labs (5/12): K 4.3, creatinine 0.81 Labs (7/12): LDL 47, HDL 55 Labs (8/12): K 4.5, creatinine 0.78 Labs (1/13): LDL 66, HDL 55, K 4.6, creatinine 1.1 Labs (1/14): K 4.5, creatinine 1.0, LDL 81, HDL 49 Labs (12/14): K 4.1, creatinine 1.0, hgb 10.9  Allergies (verified):  1)  ! * Diclofenac  Past Medical History: 1. Myocardial infarction: Patient had an inferoposterior MI in 6/11 complicated by ventricular fibrillation arrest.  LHC showed occlusion of OM1 and and left-sided PDA.  Other vessels showed no angiographic CAD.  This was thought to be embolic and was treated with heparin gtt.  He was actually found soon thereafter to have extensive left atrial thrombus. TEE (8/11) showed EF 50%, well-seated bioprosthetic aortic valve.  Echo (7/12) showed moderate biatrial enlargement, EF 45-50% with severe inferoposterior hypokinesis, normal RV size and systolic function, normal functioning bioprosthetic aortic valve. Echo (7/13) with EF 50-55%, bioprosthetic aortic valve with mean gradient 8 mmHg.  2. Ventricular fibrillation arrest in setting of acute MI.  3. Aortic stenosis: Severe AS.  Patient had AV replacement with bioprosthetic aortic  valve in 6/11. 4. Atrial fibrillation/flutter: Complicated by cardioembolic MI.  At time of aortic valve replacement in 6/11, was noted to have extensive left atrial thrombus.  Thrombus was removed and patient had Maze procedure and left atrial appendage ligation.  Atrial fibrillation recurred and patient had 2 cardioversions while on amiodarone, neither succeeded.  He is now off amiodarone and rate controlled with coumadin anticoagulation.  5. Gout 6. History of pneumonia.  7. Diverticulosis.  8. 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 Right inguinal hernia repair 2014 10. Carpal tunnel release 2014    Family History: No premature CAD  Social History: Lives in [place] with his wife.  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  Rare ETOH.  Tobacco Use - Former. pt no longer smokes cigars quite in April 24, 2010 Smoking Status:  Quit   Current Outpatient Prescriptions  Medication Sig Dispense Refill  . acetaminophen (TYLENOL) 500 MG tablet Take 1,000 mg by mouth daily as needed (arthritis pain). For knee pain.      Marland Kitchen. allopurinol (ZYLOPRIM) 100 MG tablet Take 100 mg by mouth daily as needed (for gout).       . COLCRYS 0.6 MG tablet Take 0.6 mg by mouth daily as needed. For gout flare up      . lisinopril (PRINIVIL,ZESTRIL) 5 MG tablet Take 5 mg by mouth daily.      . metoprolol succinate (TOPROL-XL) 50 MG 24 hr tablet Take 50 mg by mouth 2 (two) times daily. Take with or immediately following a meal.      . warfarin (COUMADIN) 5 MG tablet Take 1-2 tablets (  5-10 mg total) by mouth daily. Take 10mg  daily except 5mg  on mondays and fridays  60 tablet  3  . sildenafil (VIAGRA) 50 MG tablet Take 1 tablet (50 mg total) by mouth daily as needed for erectile dysfunction.  10 tablet  0   No current facility-administered medications for this visit.    BP 116/62  Pulse 73  Ht 5\' 10"  (1.778 m)  Wt 83.915 kg (185 lb)  BMI 26.54 kg/m2 General:  Well developed, well nourished, in no acute distress. Neck:  Neck  supple, no JVD. No masses, thyromegaly or abnormal cervical nodes. Lungs:  Clear bilaterally to auscultation and percussion. Heart:  Non-displaced PMI, chest non-tender; irregular rate and rhythm, S1, S2 without rubs or gallops. 1/6 early systolic murmur RUSB.  Carotid upstroke normal, no bruit. Pedals normal pulses. No ankle edema.  Abdomen:  Bowel sounds positive; abdomen soft and non-tender without masses, organomegaly, or hernias noted. No hepatosplenomegaly. Extremities:  No clubbing or cyanosis. Neurologic:  Alert and oriented x 3. Psych:  Normal affect.  Assessment/Plan: 1. Atrial fibrillation: Chronic.  Continue warfarin and Toprol XL.  Will not use NOAC as he has valvular atrial fibrillation.  2. Bioprosthetic aortic valve: Stable on 7/13 echo.  Will repeat  echo in 7/15 to follow valve.  3. MI: Prior MI thought to be cardioembolic in setting of atrial fibrillation (not on anticoagulation).  He is on warfarin, should get bridging Lovenox if he has to come off warfarin.   Marca Ancona 11/17/2013

## 2013-12-03 ENCOUNTER — Encounter: Payer: Self-pay | Admitting: Cardiology

## 2013-12-03 ENCOUNTER — Ambulatory Visit (HOSPITAL_COMMUNITY): Payer: Medicare Other | Attending: Cardiology | Admitting: Radiology

## 2013-12-03 DIAGNOSIS — I359 Nonrheumatic aortic valve disorder, unspecified: Secondary | ICD-10-CM | POA: Insufficient documentation

## 2013-12-03 DIAGNOSIS — I079 Rheumatic tricuspid valve disease, unspecified: Secondary | ICD-10-CM | POA: Insufficient documentation

## 2013-12-03 DIAGNOSIS — I4891 Unspecified atrial fibrillation: Secondary | ICD-10-CM

## 2013-12-03 DIAGNOSIS — Z954 Presence of other heart-valve replacement: Secondary | ICD-10-CM | POA: Insufficient documentation

## 2013-12-03 DIAGNOSIS — I252 Old myocardial infarction: Secondary | ICD-10-CM | POA: Insufficient documentation

## 2013-12-03 DIAGNOSIS — I517 Cardiomegaly: Secondary | ICD-10-CM | POA: Insufficient documentation

## 2013-12-03 DIAGNOSIS — I2510 Atherosclerotic heart disease of native coronary artery without angina pectoris: Secondary | ICD-10-CM | POA: Insufficient documentation

## 2013-12-03 NOTE — Progress Notes (Signed)
Echocardiogram performed.  

## 2013-12-08 ENCOUNTER — Encounter (INDEPENDENT_AMBULATORY_CARE_PROVIDER_SITE_OTHER): Payer: Self-pay

## 2013-12-08 ENCOUNTER — Ambulatory Visit (INDEPENDENT_AMBULATORY_CARE_PROVIDER_SITE_OTHER): Payer: Medicare Other | Admitting: *Deleted

## 2013-12-08 ENCOUNTER — Telehealth: Payer: Self-pay | Admitting: Cardiology

## 2013-12-08 DIAGNOSIS — Z5181 Encounter for therapeutic drug level monitoring: Secondary | ICD-10-CM

## 2013-12-08 DIAGNOSIS — I359 Nonrheumatic aortic valve disorder, unspecified: Secondary | ICD-10-CM

## 2013-12-08 DIAGNOSIS — Z7901 Long term (current) use of anticoagulants: Secondary | ICD-10-CM

## 2013-12-08 DIAGNOSIS — I4891 Unspecified atrial fibrillation: Secondary | ICD-10-CM

## 2013-12-08 DIAGNOSIS — Z01810 Encounter for preprocedural cardiovascular examination: Secondary | ICD-10-CM | POA: Insufficient documentation

## 2013-12-08 LAB — POCT INR: INR: 1.6

## 2013-12-08 NOTE — Progress Notes (Signed)
Quick Note:  Spoke with [name]. Provided him test results from Echo and notes from Dr. Shirlee LatchMcLean. Patient verbalizes understanding and agreement to continue current treatment plan. ______

## 2013-12-08 NOTE — Telephone Encounter (Signed)
New message  ° ° °Test results  °

## 2013-12-08 NOTE — Telephone Encounter (Signed)
Spoke with [name]. Provided him test results from Echo and notes from Dr. Shirlee LatchMcLean. Patient verbalizes understanding and agreement to continue current treatment plan.

## 2013-12-22 ENCOUNTER — Encounter (INDEPENDENT_AMBULATORY_CARE_PROVIDER_SITE_OTHER): Payer: Medicare Other | Admitting: Ophthalmology

## 2013-12-22 ENCOUNTER — Ambulatory Visit (INDEPENDENT_AMBULATORY_CARE_PROVIDER_SITE_OTHER): Payer: Medicare Other | Admitting: *Deleted

## 2013-12-22 DIAGNOSIS — H2510 Age-related nuclear cataract, unspecified eye: Secondary | ICD-10-CM

## 2013-12-22 DIAGNOSIS — Z5181 Encounter for therapeutic drug level monitoring: Secondary | ICD-10-CM

## 2013-12-22 DIAGNOSIS — I4891 Unspecified atrial fibrillation: Secondary | ICD-10-CM

## 2013-12-22 DIAGNOSIS — Z7901 Long term (current) use of anticoagulants: Secondary | ICD-10-CM

## 2013-12-22 DIAGNOSIS — I359 Nonrheumatic aortic valve disorder, unspecified: Secondary | ICD-10-CM

## 2013-12-22 DIAGNOSIS — H35039 Hypertensive retinopathy, unspecified eye: Secondary | ICD-10-CM

## 2013-12-22 DIAGNOSIS — H43819 Vitreous degeneration, unspecified eye: Secondary | ICD-10-CM

## 2013-12-22 DIAGNOSIS — H33309 Unspecified retinal break, unspecified eye: Secondary | ICD-10-CM

## 2013-12-22 DIAGNOSIS — I10 Essential (primary) hypertension: Secondary | ICD-10-CM

## 2013-12-22 LAB — POCT INR: INR: 2.2

## 2014-01-12 ENCOUNTER — Ambulatory Visit (INDEPENDENT_AMBULATORY_CARE_PROVIDER_SITE_OTHER): Payer: Medicare Other | Admitting: *Deleted

## 2014-01-12 DIAGNOSIS — I4891 Unspecified atrial fibrillation: Secondary | ICD-10-CM

## 2014-01-12 DIAGNOSIS — Z7901 Long term (current) use of anticoagulants: Secondary | ICD-10-CM

## 2014-01-12 DIAGNOSIS — I359 Nonrheumatic aortic valve disorder, unspecified: Secondary | ICD-10-CM

## 2014-01-12 DIAGNOSIS — Z5181 Encounter for therapeutic drug level monitoring: Secondary | ICD-10-CM

## 2014-01-12 LAB — POCT INR: INR: 3

## 2014-01-19 ENCOUNTER — Ambulatory Visit (INDEPENDENT_AMBULATORY_CARE_PROVIDER_SITE_OTHER): Payer: Medicare Other | Admitting: *Deleted

## 2014-01-19 DIAGNOSIS — Z7901 Long term (current) use of anticoagulants: Secondary | ICD-10-CM

## 2014-01-19 DIAGNOSIS — I359 Nonrheumatic aortic valve disorder, unspecified: Secondary | ICD-10-CM

## 2014-01-19 DIAGNOSIS — I4891 Unspecified atrial fibrillation: Secondary | ICD-10-CM

## 2014-01-19 DIAGNOSIS — Z5181 Encounter for therapeutic drug level monitoring: Secondary | ICD-10-CM

## 2014-01-19 LAB — POCT INR: INR: 1.9

## 2014-01-27 ENCOUNTER — Telehealth: Payer: Self-pay | Admitting: Cardiology

## 2014-01-27 NOTE — Telephone Encounter (Signed)
LMTCB

## 2014-01-27 NOTE — Telephone Encounter (Signed)
New problem,   Pt needs a surgical clearance  for him to have his other hand the left done Carpule Tunell by  Dr Nicki ReaperGary R Kuzma  4/1 # [phone] fax [phone] as soon as possible please.  Today please pt would like a call when done leave message on his answering machine.

## 2014-01-27 NOTE — Telephone Encounter (Signed)
OK for surgery if no new symptoms.  Will need Lovenox bridge while off warfarin.

## 2014-01-27 NOTE — Telephone Encounter (Signed)
Spoke with patient and he states no new cardiac symptoms. He is arranging lovenox bridge while off coumadin  with Misty StanleyLisa R/CVRR.

## 2014-01-27 NOTE — Telephone Encounter (Signed)
Will forward to Dr McLean 

## 2014-01-27 NOTE — Telephone Encounter (Signed)
Follow up  ° ° ° °Returning call back to nurse  °

## 2014-01-31 ENCOUNTER — Ambulatory Visit (INDEPENDENT_AMBULATORY_CARE_PROVIDER_SITE_OTHER): Payer: Medicare Other | Admitting: *Deleted

## 2014-01-31 DIAGNOSIS — I359 Nonrheumatic aortic valve disorder, unspecified: Secondary | ICD-10-CM

## 2014-01-31 DIAGNOSIS — Z7901 Long term (current) use of anticoagulants: Secondary | ICD-10-CM

## 2014-01-31 DIAGNOSIS — I4891 Unspecified atrial fibrillation: Secondary | ICD-10-CM

## 2014-01-31 DIAGNOSIS — Z5181 Encounter for therapeutic drug level monitoring: Secondary | ICD-10-CM

## 2014-01-31 LAB — POCT INR: INR: 3.1

## 2014-01-31 MED ORDER — ENOXAPARIN SODIUM 120 MG/0.8ML ~~LOC~~ SOLN: 120.0000 mg | SUBCUTANEOUS | Status: DC

## 2014-01-31 NOTE — Patient Instructions (Signed)
3/26  Last dose of coumadin 3/27  No lovenox or coumadin 3/28  Lovenox 120mg  sq 9am 3/29  Lovenox 120mg  sq 9am 3/30  Lovenox 120mg  sq 9am 3/31  Lovenox 120mg  sq 9am 4/1   No lovenox -------surgery-------coumadin 10mg  pm 4/2   Lovenox 120mg  sq 9am & coumadin 10mg  pm 4/3   Lovenox 120mg  sq 9am & coumadin 10mg  pm 4/4   Lovenox 120mg  sq 9am & coumadin 10mg  pm 4/5   Lovenox 120mg  sq 9am & coumadin 10mg  pm 4/6   Lovenox 120mg  sq 9am ----- INR appt 9:50am

## 2014-02-01 ENCOUNTER — Other Ambulatory Visit: Payer: Self-pay | Admitting: Orthopedic Surgery

## 2014-02-02 ENCOUNTER — Encounter (HOSPITAL_BASED_OUTPATIENT_CLINIC_OR_DEPARTMENT_OTHER): Payer: Self-pay | Admitting: *Deleted

## 2014-02-02 NOTE — Progress Notes (Signed)
Bring all medications. [facility]  [facility]. PT PTT done there Tuesday before surgery here on Wednesday.

## 2014-02-03 ENCOUNTER — Telehealth: Payer: Self-pay | Admitting: Cardiology

## 2014-02-03 NOTE — Telephone Encounter (Signed)
New Message  Sarah with Dr. Cline CoolsGary kuzma's office called.. States that the pt is in need of a cardiac clearance// has not received clearance as of yet. Please assist   Fax # [phone]

## 2014-02-03 NOTE — Telephone Encounter (Signed)
Telephone note 01/27/14 faxed to Dr Merlyn LotKuzma [phone]. Sarah notified.

## 2014-02-03 NOTE — Progress Notes (Signed)
Called pt to confirm he is aware of the lovenox inj preop surgery here 02/09/14-he was here 12/14-he DOES have his instructions from the coumadin clinic in [place]-he will go to [facility] 02/08/14 for his bmet and pt ptt

## 2014-02-08 ENCOUNTER — Encounter (HOSPITAL_COMMUNITY)
Admission: RE | Admit: 2014-02-08 | Discharge: 2014-02-08 | Disposition: A | Discharge status: Home / Self Care | Payer: Medicare Other | Source: Ambulatory Visit | Attending: Orthopedic Surgery | Admitting: Orthopedic Surgery

## 2014-02-08 LAB — BASIC METABOLIC PANEL
BUN: 24 mg/dL — ABNORMAL HIGH (ref 6–23)
CALCIUM: 9.8 mg/dL (ref 8.4–10.5)
CO2: 27 meq/L (ref 19–32)
CREATININE: 1.09 mg/dL (ref 0.50–1.35)
Chloride: 100 mEq/L (ref 96–112)
GFR calc non Af Amer: 68 mL/min — ABNORMAL LOW (ref >90)
GFR, EST AFRICAN AMERICAN: 79 mL/min — AB (ref >90)
Glucose, Bld: 106 mg/dL — ABNORMAL HIGH (ref 70–99)
Potassium: 4.6 mEq/L (ref 3.7–5.3)
SODIUM: 138 meq/L (ref 137–147)

## 2014-02-08 LAB — APTT: aPTT: 39 seconds — ABNORMAL HIGH (ref 24–37)

## 2014-02-08 LAB — PROTIME-INR
INR: 1.12 (ref 0.00–1.49)
PROTHROMBIN TIME: 14.2 s (ref 11.6–15.2)

## 2014-02-09 ENCOUNTER — Encounter (HOSPITAL_BASED_OUTPATIENT_CLINIC_OR_DEPARTMENT_OTHER)
Admission: RE | Disposition: A | Discharge status: Home / Self Care | Payer: Self-pay | Source: Ambulatory Visit | Attending: Orthopedic Surgery

## 2014-02-09 ENCOUNTER — Ambulatory Visit (HOSPITAL_BASED_OUTPATIENT_CLINIC_OR_DEPARTMENT_OTHER)
Admission: RE | Admit: 2014-02-09 | Discharge: 2014-02-09 | Disposition: A | Discharge status: Home / Self Care | Payer: Medicare Other | Source: Ambulatory Visit | Attending: Orthopedic Surgery | Admitting: Orthopedic Surgery

## 2014-02-09 ENCOUNTER — Encounter (HOSPITAL_BASED_OUTPATIENT_CLINIC_OR_DEPARTMENT_OTHER): Payer: Self-pay | Admitting: Certified Registered"

## 2014-02-09 ENCOUNTER — Encounter (HOSPITAL_BASED_OUTPATIENT_CLINIC_OR_DEPARTMENT_OTHER): Payer: Medicare Other | Admitting: Certified Registered"

## 2014-02-09 ENCOUNTER — Ambulatory Visit (HOSPITAL_BASED_OUTPATIENT_CLINIC_OR_DEPARTMENT_OTHER): Payer: Medicare Other | Admitting: Certified Registered"

## 2014-02-09 DIAGNOSIS — I2510 Atherosclerotic heart disease of native coronary artery without angina pectoris: Secondary | ICD-10-CM | POA: Insufficient documentation

## 2014-02-09 DIAGNOSIS — M6530 Trigger finger, unspecified finger: Secondary | ICD-10-CM | POA: Insufficient documentation

## 2014-02-09 DIAGNOSIS — Z954 Presence of other heart-valve replacement: Secondary | ICD-10-CM | POA: Insufficient documentation

## 2014-02-09 DIAGNOSIS — G5620 Lesion of ulnar nerve, unspecified upper limb: Secondary | ICD-10-CM | POA: Insufficient documentation

## 2014-02-09 DIAGNOSIS — M109 Gout, unspecified: Secondary | ICD-10-CM | POA: Insufficient documentation

## 2014-02-09 DIAGNOSIS — J449 Chronic obstructive pulmonary disease, unspecified: Secondary | ICD-10-CM | POA: Insufficient documentation

## 2014-02-09 DIAGNOSIS — I4891 Unspecified atrial fibrillation: Secondary | ICD-10-CM | POA: Insufficient documentation

## 2014-02-09 DIAGNOSIS — I252 Old myocardial infarction: Secondary | ICD-10-CM | POA: Insufficient documentation

## 2014-02-09 DIAGNOSIS — K5730 Diverticulosis of large intestine without perforation or abscess without bleeding: Secondary | ICD-10-CM | POA: Insufficient documentation

## 2014-02-09 DIAGNOSIS — I10 Essential (primary) hypertension: Secondary | ICD-10-CM | POA: Insufficient documentation

## 2014-02-09 DIAGNOSIS — J4489 Other specified chronic obstructive pulmonary disease: Secondary | ICD-10-CM | POA: Insufficient documentation

## 2014-02-09 DIAGNOSIS — Z96659 Presence of unspecified artificial knee joint: Secondary | ICD-10-CM | POA: Insufficient documentation

## 2014-02-09 DIAGNOSIS — Z01812 Encounter for preprocedural laboratory examination: Secondary | ICD-10-CM | POA: Insufficient documentation

## 2014-02-09 DIAGNOSIS — G5600 Carpal tunnel syndrome, unspecified upper limb: Secondary | ICD-10-CM | POA: Insufficient documentation

## 2014-02-09 DIAGNOSIS — Z7901 Long term (current) use of anticoagulants: Secondary | ICD-10-CM | POA: Insufficient documentation

## 2014-02-09 HISTORY — PX: ULNAR NERVE TRANSPOSITION: SHX2595

## 2014-02-09 HISTORY — PX: TRIGGER FINGER RELEASE: SHX641

## 2014-02-09 HISTORY — PX: CARPAL TUNNEL RELEASE: SHX101

## 2014-02-09 SURGERY — CARPAL TUNNEL RELEASE
Anesthesia: Monitor Anesthesia Care | Site: Finger | Laterality: Left

## 2014-02-09 MED ORDER — LACTATED RINGERS IV SOLN
INTRAVENOUS | Status: DC
  Administered 2014-02-09: 10:00:00 via INTRAVENOUS

## 2014-02-09 MED ORDER — BUPIVACAINE HCL (PF) 0.5 % IJ SOLN
INTRAMUSCULAR | Status: DC | PRN
  Administered 2014-02-09: 10 mL

## 2014-02-09 MED ORDER — FENTANYL CITRATE 0.05 MG/ML IJ SOLN
INTRAMUSCULAR | Status: AC
  Filled 2014-02-09: qty 2

## 2014-02-09 MED ORDER — CEFAZOLIN SODIUM-DEXTROSE 2-3 GM-% IV SOLR
INTRAVENOUS | Status: AC
  Filled 2014-02-09: qty 50

## 2014-02-09 MED ORDER — BUPIVACAINE-EPINEPHRINE PF 0.5-1:200000 % IJ SOLN
INTRAMUSCULAR | Status: DC | PRN
  Administered 2014-02-09: 30 mL via PERINEURAL

## 2014-02-09 MED ORDER — CHLORHEXIDINE GLUCONATE 4 % EX LIQD: 60.0000 mL | Freq: Once | CUTANEOUS | Status: DC

## 2014-02-09 MED ORDER — PROPOFOL INFUSION 10 MG/ML OPTIME
INTRAVENOUS | Status: DC | PRN
  Administered 2014-02-09: 100 ug/kg/min via INTRAVENOUS

## 2014-02-09 MED ORDER — HYDROCODONE-ACETAMINOPHEN 5-325 MG PO TABS: 1.0000 | ORAL_TABLET | Freq: Four times a day (QID) | ORAL | Status: DC | PRN

## 2014-02-09 MED ORDER — MIDAZOLAM HCL 2 MG/2ML IJ SOLN
INTRAMUSCULAR | Status: AC
  Filled 2014-02-09: qty 2

## 2014-02-09 MED ORDER — CEFAZOLIN SODIUM-DEXTROSE 2-3 GM-% IV SOLR
2.0000 g | INTRAVENOUS | Status: AC
  Administered 2014-02-09: 2 g via INTRAVENOUS

## 2014-02-09 MED ORDER — FENTANYL CITRATE 0.05 MG/ML IJ SOLN
50.0000 ug | INTRAMUSCULAR | Status: DC | PRN
  Administered 2014-02-09: 100 ug via INTRAVENOUS

## 2014-02-09 MED ORDER — PROPOFOL 10 MG/ML IV EMUL
INTRAVENOUS | Status: AC
  Filled 2014-02-09: qty 50

## 2014-02-09 MED ORDER — EPHEDRINE SULFATE 50 MG/ML IJ SOLN
INTRAMUSCULAR | Status: DC | PRN
  Administered 2014-02-09: 10 mg via INTRAVENOUS

## 2014-02-09 MED ORDER — FENTANYL CITRATE 0.05 MG/ML IJ SOLN
INTRAMUSCULAR | Status: AC
  Filled 2014-02-09: qty 6

## 2014-02-09 MED ORDER — LIDOCAINE HCL (CARDIAC) 20 MG/ML IV SOLN
INTRAVENOUS | Status: DC | PRN
  Administered 2014-02-09: 50 mg via INTRAVENOUS

## 2014-02-09 MED ORDER — CEFAZOLIN SODIUM-DEXTROSE 2-3 GM-% IV SOLR: 2.0000 g | INTRAVENOUS | Status: DC

## 2014-02-09 MED ORDER — BUPIVACAINE HCL (PF) 0.25 % IJ SOLN
INTRAMUSCULAR | Status: AC
  Filled 2014-02-09: qty 30

## 2014-02-09 MED ORDER — MIDAZOLAM HCL 2 MG/2ML IJ SOLN
1.0000 mg | INTRAMUSCULAR | Status: DC | PRN
  Administered 2014-02-09: 1 mg via INTRAVENOUS

## 2014-02-09 SURGICAL SUPPLY — 56 items
BANDAGE COBAN STERILE 2 (GAUZE/BANDAGES/DRESSINGS) IMPLANT
BLADE MINI RND TIP GREEN BEAV (BLADE) IMPLANT
BLADE SURG 15 STRL LF DISP TIS (BLADE) ×1 IMPLANT
BLADE SURG 15 STRL SS (BLADE) ×3
BNDG CMPR 9X4 STRL LF SNTH (GAUZE/BANDAGES/DRESSINGS) ×1
BNDG COHESIVE 3X5 TAN STRL LF (GAUZE/BANDAGES/DRESSINGS) ×3 IMPLANT
BNDG ESMARK 4X9 LF (GAUZE/BANDAGES/DRESSINGS) ×3 IMPLANT
BNDG GAUZE ELAST 4 BULKY (GAUZE/BANDAGES/DRESSINGS) ×3 IMPLANT
CHLORAPREP W/TINT 26ML (MISCELLANEOUS) ×3 IMPLANT
CORDS BIPOLAR (ELECTRODE) ×3 IMPLANT
COVER MAYO STAND STRL (DRAPES) ×3 IMPLANT
COVER TABLE BACK 60X90 (DRAPES) ×3 IMPLANT
CUFF TOURNIQUET SINGLE 18IN (TOURNIQUET CUFF) ×3 IMPLANT
DECANTER SPIKE VIAL GLASS SM (MISCELLANEOUS) IMPLANT
DRAPE EXTREMITY T 121X128X90 (DRAPE) ×3 IMPLANT
DRAPE SURG 17X23 STRL (DRAPES) ×3 IMPLANT
DRSG KUZMA FLUFF (GAUZE/BANDAGES/DRESSINGS) IMPLANT
DRSG PAD ABDOMINAL 8X10 ST (GAUZE/BANDAGES/DRESSINGS) ×3 IMPLANT
GAUZE SPONGE 4X4 16PLY XRAY LF (GAUZE/BANDAGES/DRESSINGS) IMPLANT
GAUZE XEROFORM 1X8 LF (GAUZE/BANDAGES/DRESSINGS) ×3 IMPLANT
GLOVE BIOGEL PI IND STRL 7.0 (GLOVE) ×1 IMPLANT
GLOVE BIOGEL PI IND STRL 8.5 (GLOVE) ×1 IMPLANT
GLOVE BIOGEL PI INDICATOR 7.0 (GLOVE) ×2
GLOVE BIOGEL PI INDICATOR 8.5 (GLOVE) ×2
GLOVE ECLIPSE 6.5 STRL STRAW (GLOVE) ×3 IMPLANT
GLOVE EXAM NITRILE MD LF STRL (GLOVE) ×3 IMPLANT
GLOVE SURG ORTHO 8.0 STRL STRW (GLOVE) ×3 IMPLANT
GOWN STRL REUS W/ TWL LRG LVL3 (GOWN DISPOSABLE) ×1 IMPLANT
GOWN STRL REUS W/TWL LRG LVL3 (GOWN DISPOSABLE) ×3
GOWN STRL REUS W/TWL XL LVL3 (GOWN DISPOSABLE) ×3 IMPLANT
LOOP VESSEL MAXI BLUE (MISCELLANEOUS) IMPLANT
NDL SUT 6 .5 CRC .975X.05 MAYO (NEEDLE) IMPLANT
NEEDLE 27GAX1X1/2 (NEEDLE) IMPLANT
NEEDLE MAYO TAPER (NEEDLE)
NS IRRIG 1000ML POUR BTL (IV SOLUTION) ×3 IMPLANT
PACK BASIN DAY SURGERY FS (CUSTOM PROCEDURE TRAY) ×3 IMPLANT
PAD CAST 3X4 CTTN HI CHSV (CAST SUPPLIES) IMPLANT
PAD CAST 4YDX4 CTTN HI CHSV (CAST SUPPLIES) IMPLANT
PADDING CAST ABS 4INX4YD NS (CAST SUPPLIES)
PADDING CAST ABS COTTON 4X4 ST (CAST SUPPLIES) IMPLANT
PADDING CAST COTTON 3X4 STRL (CAST SUPPLIES)
PADDING CAST COTTON 4X4 STRL (CAST SUPPLIES)
SLEEVE SCD COMPRESS KNEE MED (MISCELLANEOUS) ×3 IMPLANT
SLING ARM LRG ADULT FOAM STRAP (SOFTGOODS) ×3 IMPLANT
SPLINT PLASTER CAST XFAST 3X15 (CAST SUPPLIES) IMPLANT
SPLINT PLASTER XTRA FASTSET 3X (CAST SUPPLIES)
SPONGE GAUZE 4X4 12PLY (GAUZE/BANDAGES/DRESSINGS) ×3 IMPLANT
STOCKINETTE 4X48 STRL (DRAPES) ×3 IMPLANT
SUT VIC AB 2-0 SH 27 (SUTURE) ×3
SUT VIC AB 2-0 SH 27XBRD (SUTURE) ×1 IMPLANT
SUT VICRYL 4-0 PS2 18IN ABS (SUTURE) IMPLANT
SUT VICRYL RAPIDE 4/0 PS 2 (SUTURE) ×6 IMPLANT
SYR BULB 3OZ (MISCELLANEOUS) ×3 IMPLANT
SYR CONTROL 10ML LL (SYRINGE) IMPLANT
TOWEL OR 17X24 6PK STRL BLUE (TOWEL DISPOSABLE) ×6 IMPLANT
UNDERPAD 30X30 INCONTINENT (UNDERPADS AND DIAPERS) IMPLANT

## 2014-02-09 NOTE — Discharge Instructions (Addendum)
[facility] Instructions °Hand Surgery ° °Wound Care: °Keep your hand elevated above the level of your heart.  Do not allow it to dangle by your side.  Keep the dressing dry and do not remove it unless your doctor advises you to do so.  He will usually change it at the time of your post-op visit.  Moving your fingers is advised to stimulate circulation but will depend on the site of your surgery.  If you have a splint applied, your doctor will advise you regarding movement. ° °Activity: °Do not drive or operate machinery today.  Rest today and then you may return to your normal activity and work as indicated by your physician. ° °Diet:  °Drink liquids today or eat a light diet.  You may resume a regular diet tomorrow.   ° °General expectations: °Pain for two to three days. °Fingers may become slightly swollen. ° °Call your doctor if any of the following occur: °Severe pain not relieved by pain medication. °Elevated temperature. °Dressing soaked with blood. °Inability to move fingers. °White or bluish color to fingers. ° ° °Post Anesthesia Home Care Instructions ° °Activity: °Get plenty of rest for the remainder of the day. A responsible adult should stay with you for 24 hours following the procedure.  °For the next 24 hours, DO NOT: °-Drive a car °-Operate machinery °-Drink alcoholic beverages °-Take any medication unless instructed by your physician °-Make any legal decisions or sign important papers. ° °Meals: °Start with liquid foods such as gelatin or soup. Progress to regular foods as tolerated. Avoid greasy, spicy, heavy foods. If nausea and/or vomiting occur, drink only clear liquids until the nausea and/or vomiting subsides. Call your physician if vomiting continues. ° °Special Instructions/Symptoms: °Your throat may feel dry or sore from the anesthesia or the breathing tube placed in your throat during surgery. If this causes discomfort, gargle with warm salt water. The discomfort should disappear within 24  hours. ° ° °Regional Anesthesia Blocks ° °1. Numbness or the inability to move the "blocked" extremity may last from 3-48 hours after placement. The length of time depends on the medication injected and your individual response to the medication. If the numbness is not going away after 48 hours, call your surgeon. ° °2. The extremity that is blocked will need to be protected until the numbness is gone and the  Strength has returned. Because you cannot feel it, you will need to take extra care to avoid injury. Because it may be weak, you may have difficulty moving it or using it. You may not know what position it is in without looking at it while the block is in effect. ° °3. For blocks in the legs and feet, returning to weight bearing and walking needs to be done carefully. You will need to wait until the numbness is entirely gone and the strength has returned. You should be able to move your leg and foot normally before you try and bear weight or walk. You will need someone to be with you when you first try to ensure you do not fall and possibly risk injury. ° °4. Bruising and tenderness at the needle site are common side effects and will resolve in a few days. ° °5. Persistent numbness or new problems with movement should be communicated to the surgeon or the [facility] ([phone])/ [facility] ([phone]). °

## 2014-02-09 NOTE — Anesthesia Procedure Notes (Addendum)
Anesthesia Regional Block:  Supraclavicular block  Pre-Anesthetic Checklist: ,, timeout performed, Correct Patient, Correct Site, Correct Laterality, Correct Procedure, Correct Position, site marked, Risks and benefits discussed, pre-op evaluation, post-op pain management  Laterality: Left  Prep: Maximum Sterile Barrier Precautions used and chloraprep       Needles:  Injection technique: Single-shot  Needle Type: Echogenic Stimulator Needle     Needle Length: 5cm 5 cm Needle Gauge: 22 and 22 G    Additional Needles:  Procedures: ultrasound guided (picture in chart) Supraclavicular block Narrative:  Start time: 02/09/2014 9:30 AM End time: 02/09/2014 9:36 AM Injection made incrementally with aspirations every 5 mL. Anesthesiologist: Fitzgerald,MD  Additional Notes: 2% Lidocaine skin wheel. Intercostobrachial block with 5cc of 0.5% Bupivicaine plain.   Procedure Name: MAC Performed by: [name], [name] Pre-anesthesia Checklist: Patient identified, Emergency Drugs available, Suction available and Patient being monitored Patient Re-evaluated:Patient Re-evaluated prior to inductionOxygen Delivery Method: Simple face mask Preoxygenation: Pre-oxygenation with 100% oxygen

## 2014-02-09 NOTE — Brief Op Note (Signed)
02/09/2014  11:06 AM  PATIENT:  [name]  68 y.o. 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  PROCEDURE:  Procedure(s): CARPAL TUNNEL RELEASE LEFT (Left) DECOMPRESSION LEFT ULNAR NERVE  (Left) RELEASE A-1 PULLEY LEFT SMALL FINGER (Left)  SURGEON:  Surgeon(s) and Role:    * Nicki ReaperGary R Khamora Karan, MD - Primary  PHYSICIAN ASSISTANT:   ASSISTANTS: none   ANESTHESIA:   regional  EBL:  Total I/O In: 300 [I.V.:300] Out: -   BLOOD ADMINISTERED:none  DRAINS: none   LOCAL MEDICATIONS USED:  NONE  SPECIMEN:  No Specimen  DISPOSITION OF SPECIMEN:  N/A  COUNTS:  YES  TOURNIQUET:   Total Tourniquet Time Documented: Upper Arm (Left) - 29 minutes Total: Upper Arm (Left) - 29 minutes   DICTATION: .Other Dictation: Dictation Number [id number]  PLAN OF CARE: Discharge to home after PACU  PATIENT DISPOSITION:  PACU - hemodynamically stable.

## 2014-02-09 NOTE — Transfer of Care (Signed)
Immediate Anesthesia Transfer of Care Note  Patient: [name]  Procedure(s) Performed: Procedure(s): CARPAL TUNNEL RELEASE LEFT (Left) DECOMPRESSION LEFT ULNAR NERVE  (Left) RELEASE A-1 PULLEY LEFT SMALL FINGER (Left)  Patient Location: PACU  Anesthesia Type:MAC combined with regional for post-op pain  Level of Consciousness: awake, alert , oriented and patient cooperative  Airway & Oxygen Therapy: Patient Spontanous Breathing and Patient connected to face mask oxygen  Post-op Assessment: Report given to PACU RN, Post -op Vital signs reviewed and stable and Patient moving all extremities  Post vital signs: Reviewed and stable  Complications: No apparent anesthesia complications

## 2014-02-09 NOTE — H&P (Signed)
[name] is a 68 year-old male right-hand dominant complaining of numbness and tingling of his bilateral hands.  He has undergone carpal tunnel release, cubital tunnel release on his right side with excellent result.  He has developed triggering of his left ring finger.  He has positive nerve conductions on his left side for cubital tunnel and carpal tunnel syndrome.  He has atrophy of his intrinsics of his hand.  He is desirous of proceeding to have his left side done.  Nerve conductions are positive for carpal tunnel, cubital tunnel syndrome on his left side  ALLERGIES:    None.  MEDICATIONS:  He is on Tylenol, allopurinol, amoxicillin, Colcrys, Lovenox, Lisinopril, Toprol XL and warfarin.       SURGICAL HISTORY:    He has had heart valve replacement, knee surgery and hernia repair.  FAMILY MEDICAL HISTORY:  Positive for arthritis.     SOCIAL HISTORY:   He does not smoke, he drinks socially.  He is married, retired.    REVIEW OF SYSTEMS:    Positive for heart attacks, heart valve replacement, easy bruising, otherwise negative.   [name] is an 68 y.o. male.   Chief Complaint: CTS, Cubital tunnel left and STS LSF HPI: see above  Past Medical History  Diagnosis Date  . Aortic stenosis     a. s/p bioprosth AVR 04/2010;  b. 05/2012 Echo: EF 50-55%, nl bioprosth fxn w/ 80mHg gradient, mold dil LA.  .Marland KitchenGout   . History of pneumonia   . Diverticulosis   . Ventricular fibrillation     a. VR arrest in setting of thrombotic MI 04/2010.  . Atrial fibrillation or flutter     a. s/p Cox-Maze @ time of AVR;  b. Previously on Amio and s/p DCCV x 2 ->failed->now rate controlled and on chronic coumadin.  . Thrombus of left atrial appendage Hx of -     a. 04/2010  . Myocardial infarction   . COPD (chronic obstructive pulmonary disease)     bronchitis HX  . DJD (degenerative joint disease)     a. s/p R TKA 2010;  b. s/p L TKA 2013.  . Wears glasses     reading  . Hypertension   . CAD  (coronary artery disease)     a. s/p MI/VF arrest 04/2010 w/ cath showing OM1 and LPDA a occlusions 2/2 thrombus in.  Cors otw nonobs.    Past Surgical History  Procedure Laterality Date  . Right ankle pinning      Fell from tree w/fracture several years ago.  . Right total knee replacement  04/02/2011  . 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 Retinal laser procedure      right eye   . Total knee arthroplasty  07/20/2012    Procedure: TOTAL KNEE ARTHROPLASTY;  Surgeon: RLorn Junes MD;  [facility]  Service: Orthopedics;  Laterality: Left;  left total knee arthroplasty  . Inguinal hernia repair Right 08/25/2013    Procedure: HERNIA REPAIR INGUINAL ADULT;  Surgeon: DHarl Bowie MD;  [facility]  Service: General;  Laterality: Right;  . Insertion of mesh Right 08/25/2013    Procedure: INSERTION OF MESH;  Surgeon: DHarl Bowie MD;  [facility]  Service: General;  Laterality: Right;  . Colonoscopy    . Carpal tunnel release Right 10/18/2013    Procedure: RIGHT CARPAL TUNNEL RELEASE;  Surgeon: GWynonia Sours MD;  Location: [facility];  Service: Orthopedics;  Laterality: Right;  . Ulnar nerve transposition Right 10/18/2013    Procedure: RIGHT ULNAR NERVE DECOMPRESSION/POSSIBLE TRANSPOSITION;  Surgeon: Wynonia Sours, MD;  Location: [facility];  Service: Orthopedics;  Laterality: Right;    History reviewed. No pertinent family history. Social History:  reports that he quit smoking about 3 years ago. His smoking use included Cigars. He has never used smokeless tobacco. He reports that he drinks alcohol. He reports that he does not use illicit drugs.  Allergies:  Allergies  Allergen Reactions  . Diclofenac Itching  . Naproxen     itching    Medications Prior to Admission  Medication Sig Dispense Refill  . acetaminophen (TYLENOL) 500 MG tablet Take 1,000 mg by mouth daily as needed (arthritis  pain). For knee pain.      Marland Kitchen allopurinol (ZYLOPRIM) 100 MG tablet Take 100 mg by mouth daily as needed (for gout).       . COLCRYS 0.6 MG tablet Take 0.6 mg by mouth daily as needed. For gout flare up      . enoxaparin (LOVENOX) 120 MG/0.8ML injection Inject 0.8 mLs (120 mg total) into the skin daily.  10 Syringe  1  . lisinopril (PRINIVIL,ZESTRIL) 5 MG tablet Take 5 mg by mouth daily.      . metoprolol succinate (TOPROL-XL) 50 MG 24 hr tablet Take 50 mg by mouth 2 (two) times daily. Take with or immediately following a meal.      . sildenafil (VIAGRA) 50 MG tablet Take 1 tablet (50 mg total) by mouth daily as needed for erectile dysfunction.  10 tablet  0  . warfarin (COUMADIN) 5 MG tablet Take 1-2 tablets (5-10 mg total) by mouth daily. Take 65m daily except 570mon mondays and fridays  60 tablet  3    Results for orders placed during the hospital encounter of 02/08/14 (from the past 48 hour(s))  BASIC METABOLIC PANEL     Status: Abnormal   Collection Time    02/08/14  8:18 AM      Result Value Ref Range   Sodium 138  137 - 147 mEq/L   Potassium 4.6  3.7 - 5.3 mEq/L   Chloride 100  96 - 112 mEq/L   CO2 27  19 - 32 mEq/L   Glucose, Bld 106 (*) 70 - 99 mg/dL   BUN 24 (*) 6 - 23 mg/dL   Creatinine, Ser 1.09  0.50 - 1.35 mg/dL   Calcium 9.8  8.4 - 10.5 mg/dL   GFR calc non Af Amer 68 (*) >90 mL/min   GFR calc Af Amer 79 (*) >90 mL/min   Comment: (NOTE)     The eGFR has been calculated using the CKD EPI equation.     This calculation has not been validated in all clinical situations.     eGFR's persistently <90 mL/min signify possible Chronic Kidney     Disease.  PROTIME-INR     Status: None   Collection Time    02/08/14  8:18 AM      Result Value Ref Range   Prothrombin Time 14.2  11.6 - 15.2 seconds   INR 1.12  0.00 - 1.49  APTT     Status: Abnormal   Collection Time    02/08/14  8:18 AM      Result Value Ref Range   aPTT 39 (*) 24 - 37 seconds   Comment:  IF  BASELINE aPTT IS ELEVATED,     SUGGEST PATIENT RISK ASSESSMENT     BE USED TO DETERMINE APPROPRIATE     ANTICOAGULANT THERAPY.    No results found.   Pertinent items are noted in HPI.  Blood pressure 97/66, pulse 68, temperature 97.7 F (36.5 C), temperature source Oral, height _0  (1.778 m), weight 181 lb (82.101 kg), SpO2 99.00%.  General appearance: alert, cooperative and appears stated age Head: Normocephalic, without obvious abnormality Neck: no JVD Resp: clear to auscultation bilaterally Cardio: regular rate and rhythm, S1, S2 normal, no murmur, click, rub or gallop GI: soft, non-tender; bowel sounds normal; no masses,  no organomegaly Extremities: extremities normal, atraumatic, no cyanosis or edema Pulses: 2+ and symmetric Skin: Skin color, texture, turgor normal. No rashes or lesions Neurologic: Grossly normal Incision/Wound: na  Assessment/Plan RECOMMENDATIONS/PLAN:    He is desirous of proceeding to have his left side done.  He is aware there is no guarantee with the surgery, possibility of infection, recurrence, injury to arteries, nerves, tendons, incomplete relief of symptoms and dystrophy.    He is scheduled for carpal tunnel release, cubital tunnel decompression, possible transposition left side with release of A-1 pulley left small finger as an outpatient under regional anesthesia.  Rosio Weiss R 02/09/2014, 9:12 AM

## 2014-02-09 NOTE — Op Note (Signed)
Dictation Number [id number]

## 2014-02-09 NOTE — Anesthesia Postprocedure Evaluation (Signed)
  Anesthesia Post-op Note  Patient: Tyler CuffAlbert J Galloway  Procedure(s) Performed: Procedure(s): CARPAL TUNNEL RELEASE LEFT (Left) DECOMPRESSION LEFT ULNAR NERVE  (Left) RELEASE A-1 PULLEY LEFT SMALL FINGER (Left)  Patient Location: PACU  Anesthesia Type: MAC and Block  Level of Consciousness: awake and alert   Airway and Oxygen Therapy: Patient Spontanous Breathing  Post-op Pain: none  Post-op Assessment: Post-op Vital signs reviewed, Patient's Cardiovascular Status Stable and Respiratory Function Stable  Post-op Vital Signs: Reviewed  Filed Vitals:   02/09/14 1115  BP: 91/60  Pulse: 54  Temp:   Resp: 16    Complications: No apparent anesthesia complications

## 2014-02-09 NOTE — Anesthesia Preprocedure Evaluation (Signed)
Anesthesia Evaluation  Patient identified by MRN, date of birth, ID band Patient awake    Reviewed: Allergy & Precautions, H&P , NPO status , Patient's Chart, lab work & pertinent test results  Airway Mallampati: II TM Distance: >3 FB Neck ROM: Full    Dental no notable dental hx. (+) Teeth Intact, Dental Advisory Given   Pulmonary neg pulmonary ROS, former smoker,  breath sounds clear to auscultation  Pulmonary exam normal       Cardiovascular hypertension, On Medications and On Home Beta Blockers + CAD and + Past MI Atrial Fibrillation Rhythm:Regular Rate:Normal  S/p AVR   Neuro/Psych negative neurological ROS  negative psych ROS   GI/Hepatic negative GI ROS, Neg liver ROS,   Endo/Other  negative endocrine ROS  Renal/GU negative Renal ROS  negative genitourinary   Musculoskeletal   Abdominal   Peds  Hematology negative hematology ROS (+)   Anesthesia Other Findings   Reproductive/Obstetrics negative OB ROS                           Anesthesia Physical Anesthesia Plan  ASA: III  Anesthesia Plan: MAC and Regional   Post-op Pain Management: MAC Combined w/ Regional for Post-op pain   Induction: Intravenous  Airway Management Planned: Simple Face Mask  Additional Equipment:   Intra-op Plan:   Post-operative Plan:   Informed Consent: I have reviewed the patients History and Physical, chart, labs and discussed the procedure including the risks, benefits and alternatives for the proposed anesthesia with the patient or authorized representative who has indicated his/her understanding and acceptance.   Dental advisory given  Plan Discussed with: CRNA  Anesthesia Plan Comments:         Anesthesia Quick Evaluation

## 2014-02-09 NOTE — Progress Notes (Signed)
Assisted Dr. Fitzgerald with left, ultrasound guided, supraclavicular block. Side rails up, monitors on throughout procedure. See vital signs in flow sheet. Tolerated Procedure well. 

## 2014-02-10 NOTE — Op Note (Signed)
[name], HUR NO.:  [id number]  MEDICAL RECORD NO.:  [id number]  LOCATION:                                 FACILITY:  PHYSICIAN:  Cindee Salt, M.D.       DATE OF BIRTH:  Jun 29, 1946  DATE OF PROCEDURE:  02/09/2014 DATE OF DISCHARGE:  02/09/2014                              OPERATIVE REPORT   PREOPERATIVE DIAGNOSES:  Carpal tunnel syndrome left hand, stenosing tenosynovitis left small finger, cubital tunnel left elbow.  POSTOPERATIVE DIAGNOSES:  Carpal tunnel syndrome left hand, stenosing tenosynovitis left small finger, cubital tunnel left elbow.  OPERATION:  Decompression of ulnar nerve left elbow, decompression of median nerve, left wrist, release of A1 pulley left small finger.  SURGEON:  Cindee Salt, M.D.  ANESTHESIA:  Supraclavicular block.  ANESTHESIOLOGIST:  Zenon Mayo, MD  HISTORY:  The patient is a 68 year old male with a history of bilateral carpal tunnel syndrome, cubital tunnel syndrome.  He has developed triggering of his left small finger.  He has undergone a carpal tunnel and cubital tunnel release on his right side with excellent results.  He is admitted now for release of his left side.  Pre, para, postoperative course have been discussed along with risks and complications.  He is aware that there is no guarantee with the surgery, possibility of infection, recurrence of injury to arteries, nerves, tendons, incomplete relief of symptoms, dystrophy.  In preoperative area, the patient was seen, the extremity marked by both patient and surgeon.  Antibiotic given.  PROCEDURE IN DETAIL:  The patient was brought to the operating room, where a supraclavicular block was carried out under the direction of Dr. Sampson Goon.  He was prepped using ChloraPrep, supine position, left arm free.  A 3-minute dry time was allowed.  Time-out taken, confirming the patient and procedure.  The limb was exsanguinated with an Esmarch bandage.   Tourniquet placed high and the arm was inflated to 250 mmHg. The left small finger A1 pulley was addressed first.  An oblique incision was made over the metacarpophalangeal joint carried down through subcutaneous tissue.  Bleeders were electrocauterized with bipolar.  Retractors placed protecting the radial and ulnar neurovascular bundles.  The A1 pulley was identified.  A cyst was present on the A1 pulley.  This was excised and a release of the A1 pulley was performed on its radial aspect.  With sharp blunt dissection, a small incision was made centrally in A2.  Partial tenosynovectomy performed proximally.  The 2 tendons separated to be certain there was no adhesions between the tenosynovial tissue which was relieved.  The finger placed through a full range motion, no further triggering was noted.  The wound was irrigated and closed with interrupted 4-0 Vicryl Rapide sutures.  A longitudinal incision was then made in the left palm, now for carpal tunnel release carried down through subcutaneous tissue. Bleeders were electrocauterized.  Palmar fascia was split, superficial palmar arch identified.  Flexor tendon to the ring little finger identified to the ulnar side of the median nerve of the carpal retinaculum was incised with sharp dissection.  Right angle and Sewall retractor were placed between skin and forearm  fascia.  The fascia released for approximately a 1.5 cm proximal to the wrist crease under direct vision.  Canal was explored.  Air compression to the nerve was apparent.  Motor branch entered into muscle.  No further lesions were identified.  The wound was irrigated and the skin closed with interrupted 4-0 Vicryl Rapide.  A separate incision was then made on the medial epicondyle of the elbow, approximately 2.5 to 3 cm in length. Carried down through subcutaneous tissue.  Blunt sharp dissection was then carried down to American Financialsborne fascia.  This was released on its  posterior aspect.  The ulnar nerve was identified.  The proximal fascia was dissected from the overlying skin and subcutaneous tissue proximally.  A knee retractor placed.  The brachial fascia was then identified.  A KMI carpal tunnel release guide was then placed between the ulnar nerve and the fascia, and the fascia released with an angled ENT straight scissors for approximately 8 cm proximally.  The attention was then turned distally.  The subcutaneous tissue was dissected free from the overlying skin and subcutaneous tissue, the flexor carpi ulnaris.  Fascia was then split, the muscle was significantly atrophic.  This did easily split allowing KMI guide to be placed between the deep fascia and the ulnar nerve, and the deep fascia was then released again for approximately 6-8 cm distally.  The nerve was entirely free.  This allowed flexion of the elbow which showed no subluxation.  Wound was copiously irrigated proximally and distally.  Osborne fascia was then sutured to the posterior skin flap with interrupted 2-0 Vicryl sutures.  Subcutaneous tissue was closed with interrupted 2-0 Vicryl and skin with interrupted 4-0 Vicryl Rapide sutures.  A sterile compressive dressing over the entire arm was applied.  On deflation of the tourniquet, all fingers immediately pinked.  He was taken to the recovery room for observation in satisfactory condition.  [facility] be [facility] [place].    ______________________________ Cindee SaltGary Panhia Karl, M.D.   ______________________________ Cindee SaltGary Krishon Adkison, M.D.    GK/MEDQ  D:  02/09/2014  T:  02/09/2014  Job:  [id number]

## 2014-02-14 ENCOUNTER — Ambulatory Visit (INDEPENDENT_AMBULATORY_CARE_PROVIDER_SITE_OTHER): Payer: Medicare Other | Admitting: *Deleted

## 2014-02-14 ENCOUNTER — Encounter (HOSPITAL_BASED_OUTPATIENT_CLINIC_OR_DEPARTMENT_OTHER): Payer: Self-pay | Admitting: Orthopedic Surgery

## 2014-02-14 DIAGNOSIS — Z7901 Long term (current) use of anticoagulants: Secondary | ICD-10-CM

## 2014-02-14 DIAGNOSIS — I359 Nonrheumatic aortic valve disorder, unspecified: Secondary | ICD-10-CM

## 2014-02-14 DIAGNOSIS — I4891 Unspecified atrial fibrillation: Secondary | ICD-10-CM

## 2014-02-14 DIAGNOSIS — Z5181 Encounter for therapeutic drug level monitoring: Secondary | ICD-10-CM

## 2014-02-14 LAB — POCT INR: INR: 1.7

## 2014-02-14 MED ORDER — WARFARIN SODIUM 5 MG PO TABS: ORAL_TABLET | ORAL | Status: DC

## 2014-02-17 ENCOUNTER — Ambulatory Visit (INDEPENDENT_AMBULATORY_CARE_PROVIDER_SITE_OTHER): Payer: Medicare Other | Admitting: *Deleted

## 2014-02-17 DIAGNOSIS — Z5181 Encounter for therapeutic drug level monitoring: Secondary | ICD-10-CM

## 2014-02-17 DIAGNOSIS — Z7901 Long term (current) use of anticoagulants: Secondary | ICD-10-CM

## 2014-02-17 DIAGNOSIS — I4891 Unspecified atrial fibrillation: Secondary | ICD-10-CM

## 2014-02-17 DIAGNOSIS — I359 Nonrheumatic aortic valve disorder, unspecified: Secondary | ICD-10-CM

## 2014-02-17 LAB — POCT INR: INR: 1.9

## 2014-02-23 ENCOUNTER — Ambulatory Visit (INDEPENDENT_AMBULATORY_CARE_PROVIDER_SITE_OTHER): Payer: Medicare Other | Admitting: *Deleted

## 2014-02-23 DIAGNOSIS — I4891 Unspecified atrial fibrillation: Secondary | ICD-10-CM

## 2014-02-23 DIAGNOSIS — I359 Nonrheumatic aortic valve disorder, unspecified: Secondary | ICD-10-CM

## 2014-02-23 DIAGNOSIS — Z5181 Encounter for therapeutic drug level monitoring: Secondary | ICD-10-CM

## 2014-02-23 DIAGNOSIS — Z7901 Long term (current) use of anticoagulants: Secondary | ICD-10-CM

## 2014-02-23 LAB — POCT INR: INR: 3.2

## 2014-03-16 ENCOUNTER — Ambulatory Visit (INDEPENDENT_AMBULATORY_CARE_PROVIDER_SITE_OTHER): Payer: Medicare Other | Admitting: *Deleted

## 2014-03-16 DIAGNOSIS — I4891 Unspecified atrial fibrillation: Secondary | ICD-10-CM

## 2014-03-16 DIAGNOSIS — I359 Nonrheumatic aortic valve disorder, unspecified: Secondary | ICD-10-CM

## 2014-03-16 DIAGNOSIS — Z5181 Encounter for therapeutic drug level monitoring: Secondary | ICD-10-CM

## 2014-03-16 DIAGNOSIS — Z7901 Long term (current) use of anticoagulants: Secondary | ICD-10-CM

## 2014-03-16 LAB — POCT INR: INR: 2.7

## 2014-03-18 ENCOUNTER — Telehealth: Payer: Self-pay | Admitting: Cardiology

## 2014-03-18 NOTE — Telephone Encounter (Signed)
New problem   Pt need to know if he need to have Echo in July since  He already had one done in January 2015. Please advise.

## 2014-03-18 NOTE — Telephone Encounter (Signed)
lmtcb Debbie Jahnya Trindade RN  

## 2014-03-21 NOTE — Telephone Encounter (Signed)
Left message informing patient that reviewing his chart, it does look like Dr. Shirlee LatchMcLean wants the repeat echo in July 2015.  I advised patient that Dr. Alford HighlandMcLean's primary nurse, Katina DungAnne Lankford, RN is out of the office until next week and that I am forwarding message to her.  I advised patient that he may call the office this week with questions.

## 2014-03-28 ENCOUNTER — Telehealth: Payer: Self-pay | Admitting: Cardiology

## 2014-03-28 DIAGNOSIS — I4891 Unspecified atrial fibrillation: Secondary | ICD-10-CM

## 2014-03-28 DIAGNOSIS — I359 Nonrheumatic aortic valve disorder, unspecified: Secondary | ICD-10-CM

## 2014-03-28 NOTE — Telephone Encounter (Signed)
Spoke to patient regarding Dr. Alford HighlandMcLean's recommendation that a repeat ECHO be done in July, 2015. Patient stated this was "fine with him", although he is unsure of their July vacation plans, so it would be better to check in with his wife prior to scheduling it. Wife is [name] and her work number is: [phone].   Spoke to patient's wife, [name]. She states she will need to discuss with her husband regarding available days in July and August. She is also requesting that the appt on 07/01/14 be changed to something later in the day (after school hours, if at all possible).  She can be reached at work during day hours - [phone].  Routed to Katina DungAnne Lankford, Charity fundraiserN.

## 2014-03-28 NOTE — Telephone Encounter (Signed)
Following up    Pt 's wife called wanting to know if pt needs another Echo per Dr. Shirlee LatchMclean.   Please give them a call back.

## 2014-03-30 ENCOUNTER — Encounter: Payer: Self-pay | Admitting: *Deleted

## 2014-03-30 NOTE — Telephone Encounter (Signed)
Dr Shirlee LatchMcLean did recommend a repeat echo in July 2015. I have placed an order in Epic for this

## 2014-03-30 NOTE — Telephone Encounter (Signed)
New problem   Pt stated he need an echo but no order in system. Please advise,

## 2014-03-30 NOTE — Telephone Encounter (Signed)
Will forward to Erin Sonsebra Moore, Dr Alford HighlandMcLean's scheduler to follow up with pt/pt's wife.

## 2014-04-13 ENCOUNTER — Ambulatory Visit (INDEPENDENT_AMBULATORY_CARE_PROVIDER_SITE_OTHER): Payer: Medicare Other | Admitting: *Deleted

## 2014-04-13 DIAGNOSIS — Z7901 Long term (current) use of anticoagulants: Secondary | ICD-10-CM

## 2014-04-13 DIAGNOSIS — I4891 Unspecified atrial fibrillation: Secondary | ICD-10-CM

## 2014-04-13 DIAGNOSIS — Z5181 Encounter for therapeutic drug level monitoring: Secondary | ICD-10-CM

## 2014-04-13 DIAGNOSIS — I359 Nonrheumatic aortic valve disorder, unspecified: Secondary | ICD-10-CM

## 2014-04-13 LAB — POCT INR: INR: 2

## 2014-05-11 ENCOUNTER — Ambulatory Visit (INDEPENDENT_AMBULATORY_CARE_PROVIDER_SITE_OTHER): Payer: Medicare Other | Admitting: *Deleted

## 2014-05-11 DIAGNOSIS — Z5181 Encounter for therapeutic drug level monitoring: Secondary | ICD-10-CM

## 2014-05-11 DIAGNOSIS — I359 Nonrheumatic aortic valve disorder, unspecified: Secondary | ICD-10-CM

## 2014-05-11 DIAGNOSIS — Z7901 Long term (current) use of anticoagulants: Secondary | ICD-10-CM

## 2014-05-11 DIAGNOSIS — I4891 Unspecified atrial fibrillation: Secondary | ICD-10-CM

## 2014-05-11 DIAGNOSIS — I482 Chronic atrial fibrillation, unspecified: Secondary | ICD-10-CM

## 2014-05-11 LAB — POCT INR: INR: 1.7

## 2014-05-19 ENCOUNTER — Ambulatory Visit (INDEPENDENT_AMBULATORY_CARE_PROVIDER_SITE_OTHER): Payer: Medicare Other | Admitting: *Deleted

## 2014-05-19 DIAGNOSIS — I359 Nonrheumatic aortic valve disorder, unspecified: Secondary | ICD-10-CM

## 2014-05-19 DIAGNOSIS — I4891 Unspecified atrial fibrillation: Secondary | ICD-10-CM

## 2014-05-19 DIAGNOSIS — Z5181 Encounter for therapeutic drug level monitoring: Secondary | ICD-10-CM

## 2014-05-19 DIAGNOSIS — Z7901 Long term (current) use of anticoagulants: Secondary | ICD-10-CM

## 2014-05-19 LAB — POCT INR: INR: 1.8

## 2014-05-26 NOTE — Telephone Encounter (Signed)
Close encounter 

## 2014-06-02 ENCOUNTER — Ambulatory Visit (INDEPENDENT_AMBULATORY_CARE_PROVIDER_SITE_OTHER): Payer: Medicare Other | Admitting: *Deleted

## 2014-06-02 DIAGNOSIS — Z5181 Encounter for therapeutic drug level monitoring: Secondary | ICD-10-CM

## 2014-06-02 DIAGNOSIS — Z7901 Long term (current) use of anticoagulants: Secondary | ICD-10-CM

## 2014-06-02 DIAGNOSIS — I4891 Unspecified atrial fibrillation: Secondary | ICD-10-CM

## 2014-06-02 DIAGNOSIS — I359 Nonrheumatic aortic valve disorder, unspecified: Secondary | ICD-10-CM

## 2014-06-02 LAB — POCT INR: INR: 1.9

## 2014-06-03 ENCOUNTER — Other Ambulatory Visit: Payer: Self-pay | Admitting: Cardiology

## 2014-06-22 ENCOUNTER — Ambulatory Visit (HOSPITAL_COMMUNITY): Payer: Medicare Other | Attending: Cardiology

## 2014-06-22 ENCOUNTER — Ambulatory Visit (INDEPENDENT_AMBULATORY_CARE_PROVIDER_SITE_OTHER): Payer: Medicare Other | Admitting: *Deleted

## 2014-06-22 DIAGNOSIS — Z87891 Personal history of nicotine dependence: Secondary | ICD-10-CM | POA: Diagnosis not present

## 2014-06-22 DIAGNOSIS — I359 Nonrheumatic aortic valve disorder, unspecified: Secondary | ICD-10-CM | POA: Diagnosis not present

## 2014-06-22 DIAGNOSIS — I517 Cardiomegaly: Secondary | ICD-10-CM | POA: Diagnosis not present

## 2014-06-22 DIAGNOSIS — I2510 Atherosclerotic heart disease of native coronary artery without angina pectoris: Secondary | ICD-10-CM | POA: Insufficient documentation

## 2014-06-22 DIAGNOSIS — K4090 Unilateral inguinal hernia, without obstruction or gangrene, not specified as recurrent: Secondary | ICD-10-CM | POA: Insufficient documentation

## 2014-06-22 DIAGNOSIS — I4891 Unspecified atrial fibrillation: Secondary | ICD-10-CM

## 2014-06-22 DIAGNOSIS — R000 Tachycardia, unspecified: Secondary | ICD-10-CM | POA: Insufficient documentation

## 2014-06-22 DIAGNOSIS — M1990 Unspecified osteoarthritis, unspecified site: Secondary | ICD-10-CM | POA: Diagnosis not present

## 2014-06-22 DIAGNOSIS — I079 Rheumatic tricuspid valve disease, unspecified: Secondary | ICD-10-CM | POA: Diagnosis not present

## 2014-06-22 DIAGNOSIS — Z5181 Encounter for therapeutic drug level monitoring: Secondary | ICD-10-CM

## 2014-06-22 DIAGNOSIS — I059 Rheumatic mitral valve disease, unspecified: Secondary | ICD-10-CM | POA: Diagnosis not present

## 2014-06-22 DIAGNOSIS — I379 Nonrheumatic pulmonary valve disorder, unspecified: Secondary | ICD-10-CM | POA: Insufficient documentation

## 2014-06-22 DIAGNOSIS — Z7901 Long term (current) use of anticoagulants: Secondary | ICD-10-CM

## 2014-06-22 LAB — POCT INR: INR: 2.6

## 2014-06-22 NOTE — Progress Notes (Signed)
2D Echo completed. 06/22/2014 

## 2014-06-26 ENCOUNTER — Other Ambulatory Visit: Payer: Self-pay | Admitting: Cardiology

## 2014-07-01 ENCOUNTER — Ambulatory Visit (INDEPENDENT_AMBULATORY_CARE_PROVIDER_SITE_OTHER): Payer: Medicare Other | Admitting: Cardiology

## 2014-07-01 VITALS — BP 118/74 | HR 74 | Ht 70.0 in | Wt 183.0 lb

## 2014-07-01 DIAGNOSIS — I2510 Atherosclerotic heart disease of native coronary artery without angina pectoris: Secondary | ICD-10-CM

## 2014-07-01 DIAGNOSIS — I482 Chronic atrial fibrillation, unspecified: Secondary | ICD-10-CM

## 2014-07-01 DIAGNOSIS — I4891 Unspecified atrial fibrillation: Secondary | ICD-10-CM

## 2014-07-01 DIAGNOSIS — I359 Nonrheumatic aortic valve disorder, unspecified: Secondary | ICD-10-CM

## 2014-07-01 LAB — CBC WITH DIFFERENTIAL/PLATELET
BASOS ABS: 0.1 10*3/uL (ref 0.0–0.1)
Basophils Relative: 1 % (ref 0.0–3.0)
EOS ABS: 0.5 10*3/uL (ref 0.0–0.7)
Eosinophils Relative: 7 % — ABNORMAL HIGH (ref 0.0–5.0)
HCT: 40.9 % (ref 39.0–52.0)
Hemoglobin: 13.6 g/dL (ref 13.0–17.0)
LYMPHS PCT: 17.4 % (ref 12.0–46.0)
Lymphs Abs: 1.1 10*3/uL (ref 0.7–4.0)
MCHC: 33.4 g/dL (ref 30.0–36.0)
MCV: 98.8 fl (ref 78.0–100.0)
Monocytes Absolute: 0.7 10*3/uL (ref 0.1–1.0)
Monocytes Relative: 11.4 % (ref 3.0–12.0)
NEUTROS PCT: 63.2 % (ref 43.0–77.0)
Neutro Abs: 4.1 10*3/uL (ref 1.4–7.7)
PLATELETS: 254 10*3/uL (ref 150.0–400.0)
RBC: 4.14 Mil/uL — AB (ref 4.22–5.81)
RDW: 14.7 % (ref 11.5–15.5)
WBC: 6.5 10*3/uL (ref 4.0–10.5)

## 2014-07-01 NOTE — Patient Instructions (Addendum)
Your physician recommends that you have  lab work today--Lipid profile/CBDd.  Your physician wants you to follow-up in: 1 year with Dr Shirlee LatchMcLean. (August 2016).  You will receive a reminder letter in the mail two months in advance. If you don't receive a letter, please call our office to schedule the follow-up appointment.

## 2014-07-02 ENCOUNTER — Encounter: Payer: Self-pay | Admitting: Cardiology

## 2014-07-02 LAB — LIPID PANEL
CHOL/HDL RATIO: 2
Cholesterol: 128 mg/dL (ref 0–200)
HDL: 56.9 mg/dL (ref >39.00)
LDL Cholesterol: 57 mg/dL (ref 0–99)
NonHDL: 71.1
Triglycerides: 70 mg/dL (ref 0.0–149.0)
VLDL: 14 mg/dL (ref 0.0–40.0)

## 2014-07-02 NOTE — Progress Notes (Signed)
Patient ID: [name], male   DOB: 04/15/1946, 68 y.o.   MRN: [id number] PCP: Dr. Renard Matter [name])  68 yo with history of atrial fibrillation, cardioembolic MI, and aortic stenosis s/p aortic valve replacement presents for cardiology followup.  Last echo in 8/15 showed EF 50-55% with well-seated bioprosthetic aortic valve.  No chest pain, no exertional dyspnea.  He remains in atrial fibrillation.  He has good exercise tolerance.  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  He continues to golf at least once a week.    ECG: atrial fibrillation, low voltage  Labs (1/12): K 4.7, creatinine 1.1, HDL 71, LDL 72 Labs (5/12): K 4.3, creatinine 0.81 Labs (7/12): LDL 47, HDL 55 Labs (8/12): K 4.5, creatinine 0.78 Labs (1/13): LDL 66, HDL 55, K 4.6, creatinine 1.1 Labs (1/14): K 4.5, creatinine 1.0, LDL 81, HDL 49 Labs (12/14): K 4.1, creatinine 1.0, hgb 10.9 Labs (3/15): K 4.6, creatinine 1.09  Allergies (verified):  1)  ! * Diclofenac  Past Medical History: 1. Myocardial infarction: Patient had an inferoposterior MI in 6/11 complicated by ventricular fibrillation arrest.  LHC showed occlusion of OM1 and and left-sided PDA.  Other vessels showed no angiographic CAD.  This was thought to be embolic and was treated with heparin gtt.  He was actually found soon thereafter to have extensive left atrial thrombus. TEE (8/11) showed EF 50%, well-seated bioprosthetic aortic valve.  Echo (7/12) showed moderate biatrial enlargement, EF 45-50% with severe inferoposterior hypokinesis, normal RV size and systolic function, normal functioning bioprosthetic aortic valve. Echo (7/13) with EF 50-55%, bioprosthetic aortic valve with mean gradient 8 mmHg. Echo (8/15) with EF 50-55%, inferior hypokinesis, normal bioprosthetic aortic valve.  2. Ventricular fibrillation arrest in setting of acute MI.  3. Aortic stenosis: Severe AS.  Patient had AV replacement with bioprosthetic aortic  valve in 6/11. 4. Atrial fibrillation/flutter: Complicated by cardioembolic MI.  At time of aortic valve replacement in 6/11, was noted to have extensive left atrial thrombus.  Thrombus was removed and patient had Maze procedure and left atrial appendage ligation.  Atrial fibrillation recurred and patient had 2 cardioversions while on amiodarone, neither succeeded.  He is now off amiodarone and rate controlled with coumadin anticoagulation.  5. Gout 6. History of pneumonia.  7. Diverticulosis.  8. 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 Right inguinal hernia repair 2014 10. Carpal tunnel release 2014    Family History: No premature CAD  Social History: Lives in [place] with his wife.  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  Rare ETOH.  Tobacco Use - Former. pt no longer smokes cigars quite in April 24, 2010 Smoking Status:  Quit   Current Outpatient Prescriptions  Medication Sig Dispense Refill  . acetaminophen (TYLENOL) 500 MG tablet Take 1,000 mg by mouth daily as needed (arthritis pain). For knee pain.      Marland Kitchen allopurinol (ZYLOPRIM) 100 MG tablet Take 100 mg by mouth daily as needed (for gout).       . COLCRYS 0.6 MG tablet Take 0.6 mg by mouth daily as needed. For gout flare up      . HYDROcodone-acetaminophen (NORCO) 5-325 MG per tablet Take 1 tablet by mouth every 6 (six) hours as needed for moderate pain.  30 tablet  0  . lisinopril (PRINIVIL,ZESTRIL) 5 MG tablet Take 5 mg by mouth daily.      . metoprolol succinate (TOPROL-XL) 50 MG 24 hr tablet Take 50 mg by mouth 2 (  two) times daily. Take with or immediately following a meal.      . metoprolol succinate (TOPROL-XL) 50 MG 24 hr tablet TAKE ONE TABLET TWICE DAILY  60 tablet  1  . sildenafil (VIAGRA) 50 MG tablet Take 1 tablet (50 mg total) by mouth daily as needed for erectile dysfunction.  10 tablet  0  . warfarin (COUMADIN) 5 MG tablet Take 10mg  daily except 5mg  on Mondays  60 tablet  6   No current facility-administered medications for  this visit.    BP 118/74  Pulse 74  Ht 5\' 10"  (1.778 m)  Wt 183 lb (83.008 kg)  BMI 26.26 kg/m2 General:  Well developed, well nourished, in no acute distress. Neck:  Neck supple, no JVD. No masses, thyromegaly or abnormal cervical nodes. Lungs:  Clear bilaterally to auscultation and percussion. Heart:  Non-displaced PMI, chest non-tender; irregular rate and rhythm, S1, S2 without rubs or gallops. 1/6 early systolic murmur RUSB.  Carotid upstroke normal, no bruit. Pedals normal pulses. No ankle edema.  Abdomen:  Bowel sounds positive; abdomen soft and non-tender without masses, organomegaly, or hernias noted. No hepatosplenomegaly. Extremities:  No clubbing or cyanosis. Neurologic:  Alert and oriented x 3. Psych:  Normal affect.  Assessment/Plan: 1. Atrial fibrillation: Chronic.  Continue warfarin and Toprol XL.  Will not use NOAC as he has valvular atrial fibrillation. Check CBC today.  2. Bioprosthetic aortic valve: Stable on 8/15 echo.    3. MI: Prior MI thought to be cardioembolic in setting of atrial fibrillation (not on anticoagulation).  He is on warfarin, should get bridging Lovenox if he has to come off warfarin.   Will check lipids today as well.   Marca AnconaDalton Ranesha Val 07/02/2014

## 2014-07-04 ENCOUNTER — Telehealth: Payer: Self-pay | Admitting: Cardiology

## 2014-07-04 NOTE — Telephone Encounter (Signed)
Follow up:    Pt called for his results from 8/21 appt please give him a call back.

## 2014-07-04 NOTE — Telephone Encounter (Signed)
Spoke with patient about recent lab results 

## 2014-07-20 ENCOUNTER — Ambulatory Visit (INDEPENDENT_AMBULATORY_CARE_PROVIDER_SITE_OTHER): Payer: Medicare Other | Admitting: *Deleted

## 2014-07-20 DIAGNOSIS — Z5181 Encounter for therapeutic drug level monitoring: Secondary | ICD-10-CM

## 2014-07-20 DIAGNOSIS — I4891 Unspecified atrial fibrillation: Secondary | ICD-10-CM

## 2014-07-20 DIAGNOSIS — I359 Nonrheumatic aortic valve disorder, unspecified: Secondary | ICD-10-CM

## 2014-07-20 DIAGNOSIS — Z7901 Long term (current) use of anticoagulants: Secondary | ICD-10-CM

## 2014-07-20 LAB — POCT INR: INR: 2.7

## 2014-08-01 ENCOUNTER — Other Ambulatory Visit: Payer: Self-pay | Admitting: Cardiology

## 2014-08-17 ENCOUNTER — Ambulatory Visit (INDEPENDENT_AMBULATORY_CARE_PROVIDER_SITE_OTHER): Payer: Medicare Other | Admitting: *Deleted

## 2014-08-17 DIAGNOSIS — Z5181 Encounter for therapeutic drug level monitoring: Secondary | ICD-10-CM

## 2014-08-17 DIAGNOSIS — I4891 Unspecified atrial fibrillation: Secondary | ICD-10-CM

## 2014-08-17 DIAGNOSIS — I359 Nonrheumatic aortic valve disorder, unspecified: Secondary | ICD-10-CM

## 2014-08-17 DIAGNOSIS — Z7901 Long term (current) use of anticoagulants: Secondary | ICD-10-CM

## 2014-08-17 LAB — POCT INR: INR: 2.9

## 2014-09-05 ENCOUNTER — Other Ambulatory Visit: Payer: Self-pay | Admitting: Cardiology

## 2014-09-19 ENCOUNTER — Other Ambulatory Visit: Payer: Self-pay | Admitting: Cardiology

## 2014-09-26 ENCOUNTER — Other Ambulatory Visit (HOSPITAL_COMMUNITY): Payer: Self-pay | Admitting: Family Medicine

## 2014-09-26 ENCOUNTER — Ambulatory Visit (HOSPITAL_COMMUNITY)
Admission: RE | Admit: 2014-09-26 | Discharge: 2014-09-26 | Disposition: A | Discharge status: Home / Self Care | Payer: Medicare Other | Source: Ambulatory Visit | Attending: Family Medicine | Admitting: Family Medicine

## 2014-09-26 DIAGNOSIS — R05 Cough: Secondary | ICD-10-CM

## 2014-09-26 DIAGNOSIS — Z952 Presence of prosthetic heart valve: Secondary | ICD-10-CM | POA: Insufficient documentation

## 2014-09-26 DIAGNOSIS — R067 Sneezing: Secondary | ICD-10-CM | POA: Diagnosis not present

## 2014-09-26 DIAGNOSIS — J449 Chronic obstructive pulmonary disease, unspecified: Secondary | ICD-10-CM | POA: Diagnosis not present

## 2014-09-26 DIAGNOSIS — R058 Other specified cough: Secondary | ICD-10-CM

## 2014-09-28 ENCOUNTER — Ambulatory Visit (INDEPENDENT_AMBULATORY_CARE_PROVIDER_SITE_OTHER): Payer: Medicare Other | Admitting: *Deleted

## 2014-09-28 DIAGNOSIS — I359 Nonrheumatic aortic valve disorder, unspecified: Secondary | ICD-10-CM

## 2014-09-28 DIAGNOSIS — Z5181 Encounter for therapeutic drug level monitoring: Secondary | ICD-10-CM

## 2014-09-28 DIAGNOSIS — Z7901 Long term (current) use of anticoagulants: Secondary | ICD-10-CM

## 2014-09-28 DIAGNOSIS — I4891 Unspecified atrial fibrillation: Secondary | ICD-10-CM

## 2014-09-28 LAB — POCT INR: INR: 4.5

## 2014-10-12 ENCOUNTER — Ambulatory Visit (INDEPENDENT_AMBULATORY_CARE_PROVIDER_SITE_OTHER): Payer: Medicare Other | Admitting: *Deleted

## 2014-10-12 DIAGNOSIS — Z7901 Long term (current) use of anticoagulants: Secondary | ICD-10-CM

## 2014-10-12 DIAGNOSIS — I4891 Unspecified atrial fibrillation: Secondary | ICD-10-CM

## 2014-10-12 DIAGNOSIS — I359 Nonrheumatic aortic valve disorder, unspecified: Secondary | ICD-10-CM

## 2014-10-12 DIAGNOSIS — Z5181 Encounter for therapeutic drug level monitoring: Secondary | ICD-10-CM

## 2014-10-12 LAB — POCT INR: INR: 1.5

## 2014-10-26 ENCOUNTER — Ambulatory Visit (INDEPENDENT_AMBULATORY_CARE_PROVIDER_SITE_OTHER): Payer: Medicare Other | Admitting: *Deleted

## 2014-10-26 DIAGNOSIS — I359 Nonrheumatic aortic valve disorder, unspecified: Secondary | ICD-10-CM

## 2014-10-26 DIAGNOSIS — Z5181 Encounter for therapeutic drug level monitoring: Secondary | ICD-10-CM

## 2014-10-26 DIAGNOSIS — Z7901 Long term (current) use of anticoagulants: Secondary | ICD-10-CM

## 2014-10-26 DIAGNOSIS — I4891 Unspecified atrial fibrillation: Secondary | ICD-10-CM

## 2014-10-26 LAB — POCT INR: INR: 1.6

## 2014-11-02 ENCOUNTER — Ambulatory Visit (INDEPENDENT_AMBULATORY_CARE_PROVIDER_SITE_OTHER): Payer: Medicare Other | Admitting: *Deleted

## 2014-11-02 DIAGNOSIS — Z7901 Long term (current) use of anticoagulants: Secondary | ICD-10-CM

## 2014-11-02 DIAGNOSIS — I4891 Unspecified atrial fibrillation: Secondary | ICD-10-CM

## 2014-11-02 DIAGNOSIS — I359 Nonrheumatic aortic valve disorder, unspecified: Secondary | ICD-10-CM

## 2014-11-02 DIAGNOSIS — Z5181 Encounter for therapeutic drug level monitoring: Secondary | ICD-10-CM

## 2014-11-02 LAB — POCT INR: INR: 2.8

## 2014-11-02 MED ORDER — WARFARIN SODIUM 5 MG PO TABS: ORAL_TABLET | ORAL | Status: DC

## 2014-11-16 ENCOUNTER — Ambulatory Visit (INDEPENDENT_AMBULATORY_CARE_PROVIDER_SITE_OTHER): Payer: Medicare Other | Admitting: *Deleted

## 2014-11-16 DIAGNOSIS — Z5181 Encounter for therapeutic drug level monitoring: Secondary | ICD-10-CM

## 2014-11-16 DIAGNOSIS — Z7901 Long term (current) use of anticoagulants: Secondary | ICD-10-CM

## 2014-11-16 DIAGNOSIS — I359 Nonrheumatic aortic valve disorder, unspecified: Secondary | ICD-10-CM

## 2014-11-16 DIAGNOSIS — I4891 Unspecified atrial fibrillation: Secondary | ICD-10-CM

## 2014-11-16 LAB — POCT INR: INR: 2.7

## 2014-12-07 ENCOUNTER — Ambulatory Visit (INDEPENDENT_AMBULATORY_CARE_PROVIDER_SITE_OTHER): Payer: Medicare Other | Admitting: *Deleted

## 2014-12-07 DIAGNOSIS — Z7901 Long term (current) use of anticoagulants: Secondary | ICD-10-CM

## 2014-12-07 DIAGNOSIS — I4891 Unspecified atrial fibrillation: Secondary | ICD-10-CM

## 2014-12-07 DIAGNOSIS — I359 Nonrheumatic aortic valve disorder, unspecified: Secondary | ICD-10-CM

## 2014-12-07 DIAGNOSIS — Z5181 Encounter for therapeutic drug level monitoring: Secondary | ICD-10-CM

## 2014-12-07 LAB — POCT INR: INR: 4.5

## 2014-12-14 ENCOUNTER — Ambulatory Visit (INDEPENDENT_AMBULATORY_CARE_PROVIDER_SITE_OTHER): Payer: Medicare Other | Admitting: *Deleted

## 2014-12-14 DIAGNOSIS — I359 Nonrheumatic aortic valve disorder, unspecified: Secondary | ICD-10-CM

## 2014-12-14 DIAGNOSIS — Z5181 Encounter for therapeutic drug level monitoring: Secondary | ICD-10-CM

## 2014-12-14 DIAGNOSIS — I4891 Unspecified atrial fibrillation: Secondary | ICD-10-CM

## 2014-12-14 DIAGNOSIS — I480 Paroxysmal atrial fibrillation: Secondary | ICD-10-CM

## 2014-12-14 DIAGNOSIS — Z7901 Long term (current) use of anticoagulants: Secondary | ICD-10-CM

## 2014-12-14 LAB — POCT INR: INR: 3.6

## 2014-12-26 ENCOUNTER — Ambulatory Visit (INDEPENDENT_AMBULATORY_CARE_PROVIDER_SITE_OTHER): Payer: Medicare Other | Admitting: *Deleted

## 2014-12-26 DIAGNOSIS — I359 Nonrheumatic aortic valve disorder, unspecified: Secondary | ICD-10-CM

## 2014-12-26 DIAGNOSIS — I480 Paroxysmal atrial fibrillation: Secondary | ICD-10-CM

## 2014-12-26 DIAGNOSIS — Z7901 Long term (current) use of anticoagulants: Secondary | ICD-10-CM

## 2014-12-26 DIAGNOSIS — Z5181 Encounter for therapeutic drug level monitoring: Secondary | ICD-10-CM

## 2014-12-26 DIAGNOSIS — I4891 Unspecified atrial fibrillation: Secondary | ICD-10-CM

## 2014-12-26 LAB — POCT INR: INR: 4.2

## 2015-01-04 ENCOUNTER — Ambulatory Visit (INDEPENDENT_AMBULATORY_CARE_PROVIDER_SITE_OTHER): Payer: Medicare Other | Admitting: *Deleted

## 2015-01-04 DIAGNOSIS — I480 Paroxysmal atrial fibrillation: Secondary | ICD-10-CM

## 2015-01-04 DIAGNOSIS — I4891 Unspecified atrial fibrillation: Secondary | ICD-10-CM

## 2015-01-04 DIAGNOSIS — Z7901 Long term (current) use of anticoagulants: Secondary | ICD-10-CM

## 2015-01-04 DIAGNOSIS — I359 Nonrheumatic aortic valve disorder, unspecified: Secondary | ICD-10-CM

## 2015-01-04 DIAGNOSIS — Z5181 Encounter for therapeutic drug level monitoring: Secondary | ICD-10-CM

## 2015-01-04 LAB — POCT INR: INR: 2.9

## 2015-01-25 ENCOUNTER — Ambulatory Visit (INDEPENDENT_AMBULATORY_CARE_PROVIDER_SITE_OTHER): Payer: Medicare Other | Admitting: *Deleted

## 2015-01-25 DIAGNOSIS — Z5181 Encounter for therapeutic drug level monitoring: Secondary | ICD-10-CM | POA: Diagnosis not present

## 2015-01-25 DIAGNOSIS — I4891 Unspecified atrial fibrillation: Secondary | ICD-10-CM

## 2015-01-25 DIAGNOSIS — I480 Paroxysmal atrial fibrillation: Secondary | ICD-10-CM | POA: Diagnosis not present

## 2015-01-25 DIAGNOSIS — Z7901 Long term (current) use of anticoagulants: Secondary | ICD-10-CM | POA: Diagnosis not present

## 2015-01-25 DIAGNOSIS — I359 Nonrheumatic aortic valve disorder, unspecified: Secondary | ICD-10-CM

## 2015-01-25 LAB — POCT INR: INR: 3

## 2015-02-27 ENCOUNTER — Ambulatory Visit (INDEPENDENT_AMBULATORY_CARE_PROVIDER_SITE_OTHER): Payer: Medicare Other | Admitting: *Deleted

## 2015-02-27 DIAGNOSIS — Z5181 Encounter for therapeutic drug level monitoring: Secondary | ICD-10-CM | POA: Diagnosis not present

## 2015-02-27 DIAGNOSIS — I4891 Unspecified atrial fibrillation: Secondary | ICD-10-CM | POA: Diagnosis not present

## 2015-02-27 DIAGNOSIS — I480 Paroxysmal atrial fibrillation: Secondary | ICD-10-CM

## 2015-02-27 DIAGNOSIS — I359 Nonrheumatic aortic valve disorder, unspecified: Secondary | ICD-10-CM | POA: Diagnosis not present

## 2015-02-27 DIAGNOSIS — Z7901 Long term (current) use of anticoagulants: Secondary | ICD-10-CM | POA: Diagnosis not present

## 2015-02-27 LAB — POCT INR: INR: 2

## 2015-03-27 ENCOUNTER — Ambulatory Visit (INDEPENDENT_AMBULATORY_CARE_PROVIDER_SITE_OTHER): Payer: Medicare Other | Admitting: *Deleted

## 2015-03-27 DIAGNOSIS — Z5181 Encounter for therapeutic drug level monitoring: Secondary | ICD-10-CM | POA: Diagnosis not present

## 2015-03-27 DIAGNOSIS — I4891 Unspecified atrial fibrillation: Secondary | ICD-10-CM

## 2015-03-27 DIAGNOSIS — Z7901 Long term (current) use of anticoagulants: Secondary | ICD-10-CM | POA: Diagnosis not present

## 2015-03-27 DIAGNOSIS — I359 Nonrheumatic aortic valve disorder, unspecified: Secondary | ICD-10-CM | POA: Diagnosis not present

## 2015-03-27 LAB — POCT INR: INR: 2.4

## 2015-04-24 ENCOUNTER — Ambulatory Visit (HOSPITAL_COMMUNITY)
Admission: RE | Admit: 2015-04-24 | Discharge: 2015-04-24 | Disposition: A | Discharge status: Home / Self Care | Payer: Medicare Other | Source: Ambulatory Visit | Attending: Family Medicine | Admitting: Family Medicine

## 2015-04-24 ENCOUNTER — Other Ambulatory Visit (HOSPITAL_COMMUNITY): Payer: Self-pay | Admitting: Family Medicine

## 2015-04-24 DIAGNOSIS — R52 Pain, unspecified: Secondary | ICD-10-CM

## 2015-04-24 DIAGNOSIS — R937 Abnormal findings on diagnostic imaging of other parts of musculoskeletal system: Secondary | ICD-10-CM | POA: Diagnosis not present

## 2015-04-24 DIAGNOSIS — R0781 Pleurodynia: Secondary | ICD-10-CM | POA: Diagnosis present

## 2015-04-24 DIAGNOSIS — W010XXA Fall on same level from slipping, tripping and stumbling without subsequent striking against object, initial encounter: Secondary | ICD-10-CM | POA: Diagnosis not present

## 2015-04-24 DIAGNOSIS — Z952 Presence of prosthetic heart valve: Secondary | ICD-10-CM | POA: Insufficient documentation

## 2015-05-08 ENCOUNTER — Ambulatory Visit (INDEPENDENT_AMBULATORY_CARE_PROVIDER_SITE_OTHER): Payer: Medicare Other | Admitting: *Deleted

## 2015-05-08 DIAGNOSIS — Z5181 Encounter for therapeutic drug level monitoring: Secondary | ICD-10-CM | POA: Diagnosis not present

## 2015-05-08 DIAGNOSIS — I359 Nonrheumatic aortic valve disorder, unspecified: Secondary | ICD-10-CM | POA: Diagnosis not present

## 2015-05-08 DIAGNOSIS — I4891 Unspecified atrial fibrillation: Secondary | ICD-10-CM

## 2015-05-08 DIAGNOSIS — Z7901 Long term (current) use of anticoagulants: Secondary | ICD-10-CM

## 2015-05-08 LAB — POCT INR: INR: 2.3

## 2015-06-19 ENCOUNTER — Ambulatory Visit (INDEPENDENT_AMBULATORY_CARE_PROVIDER_SITE_OTHER): Payer: Medicare Other | Admitting: *Deleted

## 2015-06-19 DIAGNOSIS — Z5181 Encounter for therapeutic drug level monitoring: Secondary | ICD-10-CM | POA: Diagnosis not present

## 2015-06-19 DIAGNOSIS — Z7901 Long term (current) use of anticoagulants: Secondary | ICD-10-CM | POA: Diagnosis not present

## 2015-06-19 DIAGNOSIS — I359 Nonrheumatic aortic valve disorder, unspecified: Secondary | ICD-10-CM

## 2015-06-19 DIAGNOSIS — I4891 Unspecified atrial fibrillation: Secondary | ICD-10-CM | POA: Diagnosis not present

## 2015-06-19 LAB — POCT INR: INR: 6.6

## 2015-06-21 ENCOUNTER — Ambulatory Visit (INDEPENDENT_AMBULATORY_CARE_PROVIDER_SITE_OTHER): Payer: Medicare Other | Admitting: *Deleted

## 2015-06-21 DIAGNOSIS — Z5181 Encounter for therapeutic drug level monitoring: Secondary | ICD-10-CM

## 2015-06-21 DIAGNOSIS — I4891 Unspecified atrial fibrillation: Secondary | ICD-10-CM | POA: Diagnosis not present

## 2015-06-21 DIAGNOSIS — Z7901 Long term (current) use of anticoagulants: Secondary | ICD-10-CM | POA: Diagnosis not present

## 2015-06-21 DIAGNOSIS — I359 Nonrheumatic aortic valve disorder, unspecified: Secondary | ICD-10-CM

## 2015-06-21 LAB — POCT INR: INR: 4.1

## 2015-06-28 ENCOUNTER — Ambulatory Visit (INDEPENDENT_AMBULATORY_CARE_PROVIDER_SITE_OTHER): Payer: Medicare Other | Admitting: *Deleted

## 2015-06-28 DIAGNOSIS — I4891 Unspecified atrial fibrillation: Secondary | ICD-10-CM | POA: Diagnosis not present

## 2015-06-28 DIAGNOSIS — Z7901 Long term (current) use of anticoagulants: Secondary | ICD-10-CM | POA: Diagnosis not present

## 2015-06-28 DIAGNOSIS — Z5181 Encounter for therapeutic drug level monitoring: Secondary | ICD-10-CM

## 2015-06-28 DIAGNOSIS — I359 Nonrheumatic aortic valve disorder, unspecified: Secondary | ICD-10-CM

## 2015-06-28 LAB — POCT INR: INR: 4.5

## 2015-07-05 ENCOUNTER — Ambulatory Visit (INDEPENDENT_AMBULATORY_CARE_PROVIDER_SITE_OTHER): Payer: Medicare Other | Admitting: *Deleted

## 2015-07-05 DIAGNOSIS — Z7901 Long term (current) use of anticoagulants: Secondary | ICD-10-CM | POA: Diagnosis not present

## 2015-07-05 DIAGNOSIS — I359 Nonrheumatic aortic valve disorder, unspecified: Secondary | ICD-10-CM

## 2015-07-05 DIAGNOSIS — I4891 Unspecified atrial fibrillation: Secondary | ICD-10-CM

## 2015-07-05 DIAGNOSIS — Z5181 Encounter for therapeutic drug level monitoring: Secondary | ICD-10-CM | POA: Diagnosis not present

## 2015-07-05 LAB — POCT INR: INR: 2.4

## 2015-07-21 ENCOUNTER — Ambulatory Visit (INDEPENDENT_AMBULATORY_CARE_PROVIDER_SITE_OTHER): Payer: Medicare Other | Admitting: *Deleted

## 2015-07-21 DIAGNOSIS — I4891 Unspecified atrial fibrillation: Secondary | ICD-10-CM | POA: Diagnosis not present

## 2015-07-21 DIAGNOSIS — I359 Nonrheumatic aortic valve disorder, unspecified: Secondary | ICD-10-CM | POA: Diagnosis not present

## 2015-07-21 DIAGNOSIS — Z7901 Long term (current) use of anticoagulants: Secondary | ICD-10-CM

## 2015-07-21 DIAGNOSIS — Z5181 Encounter for therapeutic drug level monitoring: Secondary | ICD-10-CM | POA: Diagnosis not present

## 2015-07-21 LAB — POCT INR: INR: 2.1

## 2015-07-24 ENCOUNTER — Other Ambulatory Visit: Payer: Self-pay | Admitting: Cardiology

## 2015-07-25 ENCOUNTER — Other Ambulatory Visit: Payer: Self-pay | Admitting: Cardiology

## 2015-07-28 ENCOUNTER — Encounter: Payer: Self-pay | Admitting: Cardiology

## 2015-07-28 ENCOUNTER — Ambulatory Visit (INDEPENDENT_AMBULATORY_CARE_PROVIDER_SITE_OTHER): Payer: Medicare Other | Admitting: Cardiology

## 2015-07-28 VITALS — BP 108/76 | HR 75 | Ht 71.0 in | Wt 184.4 lb

## 2015-07-28 DIAGNOSIS — Z952 Presence of prosthetic heart valve: Secondary | ICD-10-CM

## 2015-07-28 DIAGNOSIS — I359 Nonrheumatic aortic valve disorder, unspecified: Secondary | ICD-10-CM

## 2015-07-28 DIAGNOSIS — I2510 Atherosclerotic heart disease of native coronary artery without angina pectoris: Secondary | ICD-10-CM | POA: Diagnosis not present

## 2015-07-28 DIAGNOSIS — Z954 Presence of other heart-valve replacement: Secondary | ICD-10-CM

## 2015-07-28 DIAGNOSIS — I213 ST elevation (STEMI) myocardial infarction of unspecified site: Secondary | ICD-10-CM | POA: Diagnosis not present

## 2015-07-28 DIAGNOSIS — I482 Chronic atrial fibrillation, unspecified: Secondary | ICD-10-CM

## 2015-07-28 LAB — BASIC METABOLIC PANEL
BUN: 19 mg/dL (ref 6–23)
CALCIUM: 9.3 mg/dL (ref 8.4–10.5)
CO2: 27 mEq/L (ref 19–32)
CREATININE: 1.07 mg/dL (ref 0.40–1.50)
Chloride: 97 mEq/L (ref 96–112)
GFR: 72.63 mL/min (ref >60.00)
Glucose, Bld: 87 mg/dL (ref 70–99)
Potassium: 4 mEq/L (ref 3.5–5.1)
Sodium: 132 mEq/L — ABNORMAL LOW (ref 135–145)

## 2015-07-28 LAB — CBC WITH DIFFERENTIAL/PLATELET
BASOS ABS: 0 10*3/uL (ref 0.0–0.1)
Basophils Relative: 0.6 % (ref 0.0–3.0)
EOS ABS: 0.2 10*3/uL (ref 0.0–0.7)
Eosinophils Relative: 3.4 % (ref 0.0–5.0)
HCT: 36.3 % — ABNORMAL LOW (ref 39.0–52.0)
Hemoglobin: 12.4 g/dL — ABNORMAL LOW (ref 13.0–17.0)
LYMPHS PCT: 15.1 % (ref 12.0–46.0)
Lymphs Abs: 0.9 10*3/uL (ref 0.7–4.0)
MCHC: 34.1 g/dL (ref 30.0–36.0)
MCV: 100.9 fl — ABNORMAL HIGH (ref 78.0–100.0)
Monocytes Absolute: 0.7 10*3/uL (ref 0.1–1.0)
Monocytes Relative: 11.1 % (ref 3.0–12.0)
NEUTROS ABS: 4.4 10*3/uL (ref 1.4–7.7)
NEUTROS PCT: 69.8 % (ref 43.0–77.0)
PLATELETS: 241 10*3/uL (ref 150.0–400.0)
RBC: 3.59 Mil/uL — ABNORMAL LOW (ref 4.22–5.81)
RDW: 13.5 % (ref 11.5–15.5)
WBC: 6.2 10*3/uL (ref 4.0–10.5)

## 2015-07-28 LAB — LIPID PANEL
CHOL/HDL RATIO: 2
Cholesterol: 118 mg/dL (ref 0–200)
HDL: 52 mg/dL (ref >39.00)
LDL Cholesterol: 39 mg/dL (ref 0–99)
NONHDL: 65.9
Triglycerides: 136 mg/dL (ref 0.0–149.0)
VLDL: 27.2 mg/dL (ref 0.0–40.0)

## 2015-07-28 MED ORDER — TADALAFIL 10 MG PO TABS: 10.0000 mg | ORAL_TABLET | Freq: Every day | ORAL | Status: DC | PRN

## 2015-07-28 NOTE — Patient Instructions (Signed)
Medication Instructions:  Use Cialis as needed.  Labwork: Lipid profile/BMET/CBCd today  Testing/Procedures: Your physician has requested that you have an echocardiogram. Echocardiography is a painless test that uses sound waves to create images of your heart. It provides your doctor with information about the size and shape of your heart and how well your heart's chambers and valves are working. This procedure takes approximately one hour. There are no restrictions for this procedure. September 2017    Follow-Up: Your physician wants you to follow-up in: 1 year with Dr Shirlee Latch. (September 2017). You will receive a reminder letter in the mail two months in advance. If you don't receive a letter, please call our office to schedule the follow-up appointment.

## 2015-07-30 NOTE — Progress Notes (Signed)
Patient ID: [name], male   DOB: 26-Aug-1946, 69 y.o.   MRN: [id number] PCP: Dr. Renard Matter [name])  69 yo with history of atrial fibrillation, cardioembolic MI, and aortic stenosis s/p aortic valve replacement presents for cardiology followup.  Last echo in 8/15 showed EF 50-55% with well-seated bioprosthetic aortic valve.  No chest pain, no exertional dyspnea.  He remains in atrial fibrillation.  He has good exercise tolerance.  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  He continues to golf at least once a week.  Weight is stable.   ECG: atrial fibrillation, PVC  Labs (1/12): K 4.7, creatinine 1.1, HDL 71, LDL 72 Labs (5/12): K 4.3, creatinine 0.81 Labs (7/12): LDL 47, HDL 55 Labs (8/12): K 4.5, creatinine 0.78 Labs (1/13): LDL 66, HDL 55, K 4.6, creatinine 1.1 Labs (1/14): K 4.5, creatinine 1.0, LDL 81, HDL 49 Labs (12/14): K 4.1, creatinine 1.0, hgb 10.9 Labs (3/15): K 4.6, creatinine 1.09 Labs (8/15): LDL 57, HDL 57, HCT 40.9  Allergies (verified):  1)  ! * Diclofenac  Past Medical History: 1. Myocardial infarction: Patient had an inferoposterior MI in 6/11 complicated by ventricular fibrillation arrest.  LHC showed occlusion of OM1 and and left-sided PDA.  Other vessels showed no angiographic CAD.  This was thought to be embolic and was treated with heparin gtt.  He was actually found soon thereafter to have extensive left atrial thrombus. TEE (8/11) showed EF 50%, well-seated bioprosthetic aortic valve.  Echo (7/12) showed moderate biatrial enlargement, EF 45-50% with severe inferoposterior hypokinesis, normal RV size and systolic function, normal functioning bioprosthetic aortic valve. Echo (7/13) with EF 50-55%, bioprosthetic aortic valve with mean gradient 8 mmHg. Echo (8/15) with EF 50-55%, inferior hypokinesis, normal bioprosthetic aortic valve.  2. Ventricular fibrillation arrest in setting of acute MI.  3. Aortic stenosis: Severe AS.   Patient had AV replacement with bioprosthetic aortic valve in 6/11. 4. Atrial fibrillation/flutter: Complicated by cardioembolic MI.  At time of aortic valve replacement in 6/11, was noted to have extensive left atrial thrombus.  Thrombus was removed and patient had Maze procedure and left atrial appendage ligation.  Atrial fibrillation recurred and patient had 2 cardioversions while on amiodarone, neither succeeded.  He is now off amiodarone and rate controlled with coumadin anticoagulation.  5. Gout 6. History of pneumonia.  7. Diverticulosis.  8. 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 Right inguinal hernia repair 2014 10. Carpal tunnel release 2014    Family History: No premature CAD  Social History: Lives in [place] with his wife.  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  Rare ETOH.  Tobacco Use - Former. pt no longer smokes cigars quite in April 24, 2010 Smoking Status:  Quit   Current Outpatient Prescriptions  Medication Sig Dispense Refill  . acetaminophen (TYLENOL) 500 MG tablet Take 1,000 mg by mouth daily as needed (arthritis pain). For knee pain.    Marland Kitchen HYDROcodone-acetaminophen (NORCO) 5-325 MG per tablet Take 1 tablet by mouth every 6 (six) hours as needed for moderate pain. 30 tablet 0  . lisinopril (PRINIVIL,ZESTRIL) 5 MG tablet Take 5 mg by mouth daily.    Marland Kitchen lisinopril (PRINIVIL,ZESTRIL) 5 MG tablet TAKE ONE (1) TABLET EACH DAY 90 tablet 3  . metoprolol succinate (TOPROL-XL) 50 MG 24 hr tablet TAKE ONE TABLET TWICE DAILY 60 tablet 0  . warfarin (COUMADIN) 5 MG tablet Take 2 tablets daily except 1 tablet on Mondays and Fridays 75 tablet 3  .  tadalafil (CIALIS) 10 MG tablet Take 1 tablet (10 mg total) by mouth daily as needed for erectile dysfunction. 10 tablet 0   No current facility-administered medications for this visit.    BP 108/76 mmHg  Pulse 75  Ht  (1.803 m)  Wt 184 lb 6.4 oz (83.643 kg)  BMI 25.73 kg/m2 General:  Well developed, well nourished, in no acute  distress. Neck:  Neck supple, no JVD. No masses, thyromegaly or abnormal cervical nodes. Lungs:  Clear bilaterally to auscultation and percussion. Heart:  Non-displaced PMI, chest non-tender; irregular rate and rhythm, S1, S2 without rubs or gallops. 1/6 early systolic murmur RUSB.  Carotid upstroke normal, no bruit. Pedals normal pulses. No ankle edema.  Abdomen:  Bowel sounds positive; abdomen soft and non-tender without masses, organomegaly, or hernias noted. No hepatosplenomegaly. Extremities:  No clubbing or cyanosis. Neurologic:  Alert and oriented x 3. Psych:  Normal affect.  Assessment/Plan: 1. Atrial fibrillation: Chronic.  Continue warfarin and Toprol XL.  Check CBC today.  2. Bioprosthetic aortic valve: Stable on 8/15 echo.   Repeat echo at followup in 1 year.  3. MI: Prior MI thought to be cardioembolic in setting of atrial fibrillation (not on anticoagulation).  He is on warfarin, should get bridging Lovenox if he has to come off warfarin.  Check lipids today.  4. Erectile dysfunction: He will try Cialis.  He does not use nitrates.   Marca Ancona 07/30/2015

## 2015-08-01 ENCOUNTER — Telehealth: Payer: Self-pay | Admitting: Cardiology

## 2015-08-01 NOTE — Telephone Encounter (Signed)
New Message  Pt called returned call from nurse. Pt believes the call was about lab results. Please call back to discuss.

## 2015-08-01 NOTE — Telephone Encounter (Signed)
Informed pt of lab results. Pt verbalized understanding. 

## 2015-08-09 ENCOUNTER — Ambulatory Visit (INDEPENDENT_AMBULATORY_CARE_PROVIDER_SITE_OTHER): Payer: Medicare Other | Admitting: *Deleted

## 2015-08-09 DIAGNOSIS — I4891 Unspecified atrial fibrillation: Secondary | ICD-10-CM

## 2015-08-09 DIAGNOSIS — I359 Nonrheumatic aortic valve disorder, unspecified: Secondary | ICD-10-CM | POA: Diagnosis not present

## 2015-08-09 DIAGNOSIS — Z5181 Encounter for therapeutic drug level monitoring: Secondary | ICD-10-CM | POA: Diagnosis not present

## 2015-08-09 LAB — POCT INR: INR: 1.6

## 2015-08-21 ENCOUNTER — Ambulatory Visit (INDEPENDENT_AMBULATORY_CARE_PROVIDER_SITE_OTHER): Payer: Medicare Other | Admitting: *Deleted

## 2015-08-21 DIAGNOSIS — Z5181 Encounter for therapeutic drug level monitoring: Secondary | ICD-10-CM

## 2015-08-21 DIAGNOSIS — I359 Nonrheumatic aortic valve disorder, unspecified: Secondary | ICD-10-CM | POA: Diagnosis not present

## 2015-08-21 DIAGNOSIS — I4891 Unspecified atrial fibrillation: Secondary | ICD-10-CM | POA: Diagnosis not present

## 2015-08-21 LAB — POCT INR: INR: 3

## 2015-08-24 ENCOUNTER — Other Ambulatory Visit: Payer: Self-pay | Admitting: Cardiology

## 2015-08-25 ENCOUNTER — Other Ambulatory Visit: Payer: Self-pay | Admitting: Cardiology

## 2015-09-07 ENCOUNTER — Telehealth: Payer: Self-pay | Admitting: *Deleted

## 2015-09-07 NOTE — Telephone Encounter (Signed)
Verlon AuLeslie with home health called to let us know the patient has a pelvic fracture and cannot make his appointment on 09/11/15. Verlon AuLeslie requested Vashti HeyLisa Reid call her back and let her know when they need to check his PT/INR.   Please call Verlon AuLeslie directly at [phone].

## 2015-09-07 NOTE — Telephone Encounter (Signed)
Spoke with Verlon [facility]Leslie RN with [facility].  Pt has a non-operable pelvic Fx.  Order given for her to check his INR on Monday 09/11/15.

## 2015-09-11 ENCOUNTER — Ambulatory Visit (INDEPENDENT_AMBULATORY_CARE_PROVIDER_SITE_OTHER): Payer: Medicare Other | Admitting: *Deleted

## 2015-09-11 DIAGNOSIS — Z5181 Encounter for therapeutic drug level monitoring: Secondary | ICD-10-CM

## 2015-09-11 DIAGNOSIS — I4891 Unspecified atrial fibrillation: Secondary | ICD-10-CM

## 2015-09-11 DIAGNOSIS — I359 Nonrheumatic aortic valve disorder, unspecified: Secondary | ICD-10-CM

## 2015-09-11 LAB — POCT INR: INR: 2.9

## 2015-09-22 ENCOUNTER — Telehealth: Payer: Self-pay | Admitting: Cardiology

## 2015-09-22 NOTE — Telephone Encounter (Signed)
New Message  [facility] nurse call states that the pt INR wasn't checked. Please call back with verbal orders.

## 2015-09-22 NOTE — Telephone Encounter (Signed)
Pt gets PT/INR checked in [place], will forward.

## 2015-09-23 LAB — POCT INR: INR: 2.2

## 2015-09-25 ENCOUNTER — Ambulatory Visit (INDEPENDENT_AMBULATORY_CARE_PROVIDER_SITE_OTHER): Payer: Medicare Other | Admitting: *Deleted

## 2015-09-25 DIAGNOSIS — I480 Paroxysmal atrial fibrillation: Secondary | ICD-10-CM

## 2015-09-25 DIAGNOSIS — I359 Nonrheumatic aortic valve disorder, unspecified: Secondary | ICD-10-CM

## 2015-09-25 DIAGNOSIS — Z5181 Encounter for therapeutic drug level monitoring: Secondary | ICD-10-CM

## 2015-09-25 NOTE — Telephone Encounter (Signed)
Spoke with Crystal.  Order given to check INR today or tomorrow with results to me.

## 2015-10-02 ENCOUNTER — Ambulatory Visit (INDEPENDENT_AMBULATORY_CARE_PROVIDER_SITE_OTHER): Payer: Medicare Other | Admitting: *Deleted

## 2015-10-02 DIAGNOSIS — Z5181 Encounter for therapeutic drug level monitoring: Secondary | ICD-10-CM | POA: Diagnosis not present

## 2015-10-02 DIAGNOSIS — I359 Nonrheumatic aortic valve disorder, unspecified: Secondary | ICD-10-CM

## 2015-10-02 DIAGNOSIS — I4891 Unspecified atrial fibrillation: Secondary | ICD-10-CM | POA: Diagnosis not present

## 2015-10-02 LAB — POCT INR: INR: 2.7

## 2015-10-16 ENCOUNTER — Ambulatory Visit (INDEPENDENT_AMBULATORY_CARE_PROVIDER_SITE_OTHER): Payer: Medicare Other | Admitting: *Deleted

## 2015-10-16 DIAGNOSIS — Z5181 Encounter for therapeutic drug level monitoring: Secondary | ICD-10-CM

## 2015-10-16 DIAGNOSIS — I359 Nonrheumatic aortic valve disorder, unspecified: Secondary | ICD-10-CM | POA: Diagnosis not present

## 2015-10-16 DIAGNOSIS — I4891 Unspecified atrial fibrillation: Secondary | ICD-10-CM | POA: Diagnosis not present

## 2015-10-16 LAB — POCT INR: INR: 3.1

## 2015-10-24 ENCOUNTER — Other Ambulatory Visit: Payer: Self-pay | Admitting: Cardiology

## 2015-11-08 ENCOUNTER — Ambulatory Visit (INDEPENDENT_AMBULATORY_CARE_PROVIDER_SITE_OTHER): Payer: Medicare Other | Admitting: *Deleted

## 2015-11-08 DIAGNOSIS — I4891 Unspecified atrial fibrillation: Secondary | ICD-10-CM

## 2015-11-08 DIAGNOSIS — Z5181 Encounter for therapeutic drug level monitoring: Secondary | ICD-10-CM

## 2015-11-08 DIAGNOSIS — I359 Nonrheumatic aortic valve disorder, unspecified: Secondary | ICD-10-CM

## 2015-11-08 LAB — POCT INR: INR: 3.1

## 2015-12-06 ENCOUNTER — Ambulatory Visit (INDEPENDENT_AMBULATORY_CARE_PROVIDER_SITE_OTHER): Payer: Medicare Other | Admitting: *Deleted

## 2015-12-06 DIAGNOSIS — Z5181 Encounter for therapeutic drug level monitoring: Secondary | ICD-10-CM

## 2015-12-06 DIAGNOSIS — I359 Nonrheumatic aortic valve disorder, unspecified: Secondary | ICD-10-CM | POA: Diagnosis not present

## 2015-12-06 DIAGNOSIS — I4891 Unspecified atrial fibrillation: Secondary | ICD-10-CM

## 2015-12-06 LAB — POCT INR: INR: 1.5

## 2015-12-23 ENCOUNTER — Other Ambulatory Visit: Payer: Self-pay | Admitting: Cardiology

## 2015-12-25 ENCOUNTER — Ambulatory Visit (INDEPENDENT_AMBULATORY_CARE_PROVIDER_SITE_OTHER): Payer: Medicare Other | Admitting: *Deleted

## 2015-12-25 DIAGNOSIS — I359 Nonrheumatic aortic valve disorder, unspecified: Secondary | ICD-10-CM

## 2015-12-25 DIAGNOSIS — Z5181 Encounter for therapeutic drug level monitoring: Secondary | ICD-10-CM | POA: Diagnosis not present

## 2015-12-25 DIAGNOSIS — I4891 Unspecified atrial fibrillation: Secondary | ICD-10-CM | POA: Diagnosis not present

## 2015-12-25 LAB — POCT INR: INR: 2.1

## 2016-01-15 ENCOUNTER — Ambulatory Visit (INDEPENDENT_AMBULATORY_CARE_PROVIDER_SITE_OTHER): Payer: Medicare Other | Admitting: *Deleted

## 2016-01-15 DIAGNOSIS — I359 Nonrheumatic aortic valve disorder, unspecified: Secondary | ICD-10-CM | POA: Diagnosis not present

## 2016-01-15 DIAGNOSIS — Z5181 Encounter for therapeutic drug level monitoring: Secondary | ICD-10-CM

## 2016-01-15 DIAGNOSIS — I4891 Unspecified atrial fibrillation: Secondary | ICD-10-CM

## 2016-01-15 LAB — POCT INR: INR: 1.7

## 2016-01-29 ENCOUNTER — Ambulatory Visit (INDEPENDENT_AMBULATORY_CARE_PROVIDER_SITE_OTHER): Payer: Medicare Other | Admitting: *Deleted

## 2016-01-29 DIAGNOSIS — I359 Nonrheumatic aortic valve disorder, unspecified: Secondary | ICD-10-CM

## 2016-01-29 DIAGNOSIS — I4891 Unspecified atrial fibrillation: Secondary | ICD-10-CM

## 2016-01-29 DIAGNOSIS — Z5181 Encounter for therapeutic drug level monitoring: Secondary | ICD-10-CM

## 2016-01-29 LAB — POCT INR: INR: 2.4

## 2016-02-26 ENCOUNTER — Ambulatory Visit (INDEPENDENT_AMBULATORY_CARE_PROVIDER_SITE_OTHER): Payer: Medicare Other | Admitting: *Deleted

## 2016-02-26 DIAGNOSIS — Z5181 Encounter for therapeutic drug level monitoring: Secondary | ICD-10-CM | POA: Diagnosis not present

## 2016-02-26 DIAGNOSIS — I4891 Unspecified atrial fibrillation: Secondary | ICD-10-CM

## 2016-02-26 DIAGNOSIS — I359 Nonrheumatic aortic valve disorder, unspecified: Secondary | ICD-10-CM | POA: Diagnosis not present

## 2016-02-26 LAB — POCT INR: INR: 1.7

## 2016-02-28 NOTE — Patient Instructions (Signed)
Your procedure is scheduled on: 03/04/2016  Report to [facility] at  1230  PM.  Call this number if you have problems the morning of surgery: [phone]   Do not eat food or drink liquids :After Midnight.      Take these medicines the morning of surgery with A SIP OF WATER: allopurinol, lisinopril, toprol.   Do not wear jewelry, make-up or nail polish.  Do not wear lotions, powders, or perfumes. You may wear deodorant.  Do not shave 48 hours prior to surgery.  Do not bring valuables to the hospital.  Contacts, dentures or bridgework may not be worn into surgery.  Leave suitcase in the car. After surgery it may be brought to your room.  For patients admitted to the hospital, checkout time is 11:00 AM the day of discharge.   Patients discharged the day of surgery will not be allowed to drive home.  :     Please read over the following fact sheets that you were given: Coughing and Deep Breathing, Surgical Site Infection Prevention, Anesthesia Post-op Instructions and Care and Recovery After Surgery    Cataract A cataract is a clouding of the lens of the eye. When a lens becomes cloudy, vision is reduced based on the degree and nature of the clouding. Many cataracts reduce vision to some degree. Some cataracts make people more near-sighted as they develop. Other cataracts increase glare. Cataracts that are ignored and become worse can sometimes look white. The white color can be seen through the pupil. CAUSES   Aging. However, cataracts may occur at any age, even in newborns.   Certain drugs.   Trauma to the eye.   Certain diseases such as diabetes.   Specific eye diseases such as chronic inflammation inside the eye or a sudden attack of a rare form of glaucoma.   Inherited or acquired medical problems.  SYMPTOMS   Gradual, progressive drop in vision in the affected eye.   Severe, rapid visual loss. This most often happens when trauma is the cause.  DIAGNOSIS  To detect a  cataract, an eye doctor examines the lens. Cataracts are best diagnosed with an exam of the eyes with the pupils enlarged (dilated) by drops.  TREATMENT  For an early cataract, vision may improve by using different eyeglasses or stronger lighting. If that does not help your vision, surgery is the only effective treatment. A cataract needs to be surgically removed when vision loss interferes with your everyday activities, such as driving, reading, or watching TV. A cataract may also have to be removed if it prevents examination or treatment of another eye problem. Surgery removes the cloudy lens and usually replaces it with a substitute lens (intraocular lens, IOL).  At a time when both you and your doctor agree, the cataract will be surgically removed. If you have cataracts in both eyes, only one is usually removed at a time. This allows the operated eye to heal and be out of danger from any possible problems after surgery (such as infection or poor wound healing). In rare cases, a cataract may be doing damage to your eye. In these cases, your caregiver may advise surgical removal right away. The vast majority of people who have cataract surgery have better vision afterward. HOME CARE INSTRUCTIONS  If you are not planning surgery, you may be asked to do the following:  Use different eyeglasses.   Use stronger or brighter lighting.   Ask your eye doctor about reducing your medicine  dose or changing medicines if it is thought that a medicine caused your cataract. Changing medicines does not make the cataract go away on its own.   Become familiar with your surroundings. Poor vision can lead to injury. Avoid bumping into things on the affected side. You are at a higher risk for tripping or falling.   Exercise extreme care when driving or operating machinery.   Wear sunglasses if you are sensitive to bright light or experiencing problems with glare.  SEEK IMMEDIATE MEDICAL CARE IF:   You have a  worsening or sudden vision loss.   You notice redness, swelling, or increasing pain in the eye.   You have a fever.  Document Released: 10/28/2005 Document Revised: 10/17/2011 Document Reviewed: 06/21/2011 [facility] Patient Information 2012 [place].PATIENT INSTRUCTIONS POST-ANESTHESIA  IMMEDIATELY FOLLOWING SURGERY:  Do not drive or operate machinery for the first twenty four hours after surgery.  Do not make any important decisions for twenty four hours after surgery or while taking narcotic pain medications or sedatives.  If you develop intractable nausea and vomiting or a severe headache please notify your doctor immediately.  FOLLOW-UP:  Please make an appointment with your surgeon as instructed. You do not need to follow up with anesthesia unless specifically instructed to do so.  WOUND CARE INSTRUCTIONS (if applicable):  Keep a dry clean dressing on the anesthesia/puncture wound site if there is drainage.  Once the wound has quit draining you may leave it open to air.  Generally you should leave the bandage intact for twenty four hours unless there is drainage.  If the epidural site drains for more than 36-48 hours please call the anesthesia department.  QUESTIONS?:  Please feel free to call your physician or the hospital operator if you have any questions, and they will be happy to assist you.

## 2016-02-29 ENCOUNTER — Encounter (HOSPITAL_COMMUNITY)
Admission: RE | Admit: 2016-02-29 | Discharge: 2016-02-29 | Disposition: A | Discharge status: Home / Self Care | Payer: Medicare Other | Source: Ambulatory Visit | Attending: Ophthalmology | Admitting: Ophthalmology

## 2016-02-29 ENCOUNTER — Encounter (HOSPITAL_COMMUNITY): Payer: Self-pay

## 2016-02-29 DIAGNOSIS — Z01810 Encounter for preprocedural cardiovascular examination: Secondary | ICD-10-CM | POA: Insufficient documentation

## 2016-02-29 DIAGNOSIS — Z01812 Encounter for preprocedural laboratory examination: Secondary | ICD-10-CM | POA: Insufficient documentation

## 2016-02-29 LAB — BASIC METABOLIC PANEL
Anion gap: 7 (ref 5–15)
BUN: 16 mg/dL (ref 6–20)
CO2: 28 mmol/L (ref 22–32)
Calcium: 9.4 mg/dL (ref 8.9–10.3)
Chloride: 102 mmol/L (ref 101–111)
Creatinine, Ser: 0.96 mg/dL (ref 0.61–1.24)
GFR calc Af Amer: >60 mL/min (ref >60)
GLUCOSE: 103 mg/dL — AB (ref 65–99)
POTASSIUM: 4.5 mmol/L (ref 3.5–5.1)
Sodium: 137 mmol/L (ref 135–145)

## 2016-02-29 LAB — CBC WITH DIFFERENTIAL/PLATELET
BASOS ABS: 0 10*3/uL (ref 0.0–0.1)
BASOS PCT: 0 %
Eosinophils Absolute: 0.3 10*3/uL (ref 0.0–0.7)
Eosinophils Relative: 6 %
HCT: 38.1 % — ABNORMAL LOW (ref 39.0–52.0)
Hemoglobin: 12.7 g/dL — ABNORMAL LOW (ref 13.0–17.0)
Lymphocytes Relative: 16 %
Lymphs Abs: 0.8 10*3/uL (ref 0.7–4.0)
MCH: 32.9 pg (ref 26.0–34.0)
MCHC: 33.3 g/dL (ref 30.0–36.0)
MCV: 98.7 fL (ref 78.0–100.0)
Monocytes Absolute: 0.7 10*3/uL (ref 0.1–1.0)
Monocytes Relative: 13 %
NEUTROS ABS: 3.5 10*3/uL (ref 1.7–7.7)
Neutrophils Relative %: 65 %
PLATELETS: 200 10*3/uL (ref 150–400)
RBC: 3.86 MIL/uL — AB (ref 4.22–5.81)
RDW: 13 % (ref 11.5–15.5)
WBC: 5.4 10*3/uL (ref 4.0–10.5)

## 2016-03-04 ENCOUNTER — Encounter (HOSPITAL_COMMUNITY): Payer: Self-pay | Admitting: *Deleted

## 2016-03-04 ENCOUNTER — Encounter (HOSPITAL_COMMUNITY)
Admission: RE | Disposition: A | Discharge status: Home / Self Care | Payer: Self-pay | Source: Ambulatory Visit | Attending: Ophthalmology

## 2016-03-04 ENCOUNTER — Ambulatory Visit (HOSPITAL_COMMUNITY)
Admission: RE | Admit: 2016-03-04 | Discharge: 2016-03-04 | Disposition: A | Discharge status: Home / Self Care | Payer: Medicare Other | Source: Ambulatory Visit | Attending: Ophthalmology | Admitting: Ophthalmology

## 2016-03-04 ENCOUNTER — Ambulatory Visit (HOSPITAL_COMMUNITY): Payer: Medicare Other | Admitting: Anesthesiology

## 2016-03-04 DIAGNOSIS — I10 Essential (primary) hypertension: Secondary | ICD-10-CM | POA: Insufficient documentation

## 2016-03-04 DIAGNOSIS — Z87891 Personal history of nicotine dependence: Secondary | ICD-10-CM | POA: Diagnosis not present

## 2016-03-04 DIAGNOSIS — J449 Chronic obstructive pulmonary disease, unspecified: Secondary | ICD-10-CM | POA: Diagnosis not present

## 2016-03-04 DIAGNOSIS — H26101 Unspecified traumatic cataract, right eye: Secondary | ICD-10-CM | POA: Diagnosis present

## 2016-03-04 DIAGNOSIS — Z79899 Other long term (current) drug therapy: Secondary | ICD-10-CM | POA: Insufficient documentation

## 2016-03-04 DIAGNOSIS — I4891 Unspecified atrial fibrillation: Secondary | ICD-10-CM | POA: Diagnosis not present

## 2016-03-04 DIAGNOSIS — Z7901 Long term (current) use of anticoagulants: Secondary | ICD-10-CM | POA: Insufficient documentation

## 2016-03-04 DIAGNOSIS — I2510 Atherosclerotic heart disease of native coronary artery without angina pectoris: Secondary | ICD-10-CM | POA: Diagnosis not present

## 2016-03-04 HISTORY — PX: CATARACT EXTRACTION W/PHACO: SHX586

## 2016-03-04 SURGERY — PHACOEMULSIFICATION, CATARACT, WITH IOL INSERTION
Anesthesia: Monitor Anesthesia Care | Site: Eye | Laterality: Right

## 2016-03-04 MED ORDER — NEOMYCIN-POLYMYXIN-DEXAMETH 3.5-10000-0.1 OP SUSP
OPHTHALMIC | Status: DC | PRN
  Administered 2016-03-04: 2 [drp] via OPHTHALMIC

## 2016-03-04 MED ORDER — PHENYLEPHRINE HCL 2.5 % OP SOLN
1.0000 [drp] | OPHTHALMIC | Status: AC
  Administered 2016-03-04 (×3): 1 [drp] via OPHTHALMIC

## 2016-03-04 MED ORDER — LIDOCAINE 3.5 % OP GEL OPTIME - NO CHARGE
OPHTHALMIC | Status: DC | PRN
  Administered 2016-03-04: 1 [drp] via OPHTHALMIC

## 2016-03-04 MED ORDER — TETRACAINE HCL 0.5 % OP SOLN
OPHTHALMIC | Status: AC
  Filled 2016-03-04: qty 4

## 2016-03-04 MED ORDER — MIDAZOLAM HCL 2 MG/2ML IJ SOLN
1.0000 mg | INTRAMUSCULAR | Status: DC | PRN
  Administered 2016-03-04 (×2): 2 mg via INTRAVENOUS
  Filled 2016-03-04: qty 2

## 2016-03-04 MED ORDER — POVIDONE-IODINE 5 % OP SOLN
OPHTHALMIC | Status: DC | PRN
  Administered 2016-03-04: 1 via OPHTHALMIC

## 2016-03-04 MED ORDER — CYCLOPENTOLATE-PHENYLEPHRINE 0.2-1 % OP SOLN
1.0000 [drp] | OPHTHALMIC | Status: AC
Start: 2016-03-04 — End: 2016-03-04
  Administered 2016-03-04 (×3): 1 [drp] via OPHTHALMIC

## 2016-03-04 MED ORDER — TETRACAINE HCL 0.5 % OP SOLN
1.0000 [drp] | OPHTHALMIC | Status: AC | PRN
  Administered 2016-03-04 (×3): 1 [drp] via OPHTHALMIC

## 2016-03-04 MED ORDER — BSS IO SOLN
INTRAOCULAR | Status: DC | PRN
  Administered 2016-03-04: 15 mL

## 2016-03-04 MED ORDER — LIDOCAINE HCL (PF) 1 % IJ SOLN
INTRAMUSCULAR | Status: DC | PRN
  Administered 2016-03-04: .5 mL

## 2016-03-04 MED ORDER — FENTANYL CITRATE (PF) 100 MCG/2ML IJ SOLN
INTRAMUSCULAR | Status: AC
  Filled 2016-03-04: qty 2

## 2016-03-04 MED ORDER — PROVISC 10 MG/ML IO SOLN
INTRAOCULAR | Status: DC | PRN
  Administered 2016-03-04: 0.85 mL via INTRAOCULAR

## 2016-03-04 MED ORDER — FENTANYL CITRATE (PF) 100 MCG/2ML IJ SOLN
25.0000 ug | INTRAMUSCULAR | Status: AC
  Administered 2016-03-04 (×2): 25 ug via INTRAVENOUS

## 2016-03-04 MED ORDER — MIDAZOLAM HCL 2 MG/2ML IJ SOLN
INTRAMUSCULAR | Status: AC
  Filled 2016-03-04: qty 2

## 2016-03-04 MED ORDER — EPINEPHRINE HCL 1 MG/ML IJ SOLN
INTRAOCULAR | Status: DC | PRN
  Administered 2016-03-04: 500 mL

## 2016-03-04 MED ORDER — EPINEPHRINE HCL 1 MG/ML IJ SOLN
INTRAMUSCULAR | Status: AC
  Filled 2016-03-04: qty 1

## 2016-03-04 MED ORDER — LACTATED RINGERS IV SOLN
INTRAVENOUS | Status: DC
  Administered 2016-03-04: 13:00:00 via INTRAVENOUS

## 2016-03-04 MED ORDER — LIDOCAINE HCL 3.5 % OP GEL
1.0000 "application " | Freq: Once | OPHTHALMIC | Status: AC
  Administered 2016-03-04: 1 via OPHTHALMIC
  Filled 2016-03-04: qty 1

## 2016-03-04 SURGICAL SUPPLY — 11 items
CLOTH BEACON ORANGE TIMEOUT ST (SAFETY) ×3 IMPLANT
EYE SHIELD UNIVERSAL CLEAR (GAUZE/BANDAGES/DRESSINGS) ×3 IMPLANT
GLOVE BIOGEL PI IND STRL 6.5 (GLOVE) ×1 IMPLANT
GLOVE BIOGEL PI INDICATOR 6.5 (GLOVE) ×2
GLOVE EXAM NITRILE MD LF STRL (GLOVE) ×3 IMPLANT
PAD ARMBOARD 7.5X6 YLW CONV (MISCELLANEOUS) ×3 IMPLANT
SIGHTPATH CAT PROC W REG LENS (Ophthalmic Related) ×3 IMPLANT
SYRINGE LUER LOK 1CC (MISCELLANEOUS) ×3 IMPLANT
TAPE SURG TRANSPORE 1 IN (GAUZE/BANDAGES/DRESSINGS) ×1 IMPLANT
TAPE SURGICAL TRANSPORE 1 IN (GAUZE/BANDAGES/DRESSINGS) ×2
WATER STERILE IRR 250ML POUR (IV SOLUTION) ×3 IMPLANT

## 2016-03-04 NOTE — Op Note (Signed)
Date of Admission: 03/04/2016  Date of Surgery: 03/04/2016   Pre-Op Dx: Cataract Right Eye  Post-Op Dx: Traumatic Cataract Right  Eye,  Dx Code H25.101  Surgeon: Gemma PayorKerry Faye Strohman, M.D.  Assistants: None  Anesthesia: Topical with MAC  Indications: Painless, progressive loss of vision with compromise of daily activities.  Surgery: Cataract Extraction with Intraocular lens Implant Right Eye  Discription: The patient had dilating drops and viscous lidocaine placed into the Right eye in the pre-op holding area. After transfer to the operating room, a time out was performed. The patient was then prepped and draped. Beginning with a 75 degree blade a paracentesis port was made at the surgeon's 2 o'clock position. The anterior chamber was then filled with 1% non-preserved lidocaine. This was followed by filling the anterior chamber with Provisc.  A 2.774mm keratome blade was used to make a clear corneal incision at the temporal limbus.  A bent cystatome needle was used to create a continuous tear capsulotomy. Hydrodissection was performed with balanced salt solution on a Fine canula. The lens nucleus was then removed using the phacoemulsification handpiece. Residual cortex was removed with the I&A handpiece. The anterior chamber and capsular bag were refilled with Provisc. A posterior chamber intraocular lens was placed into the capsular bag with it's injector. The implant was positioned with the Kuglan hook. The Provisc was then removed from the anterior chamber and capsular bag with the I&A handpiece. Stromal hydration of the main incision and paracentesis port was performed with BSS on a Fine canula. The wounds were tested for leak which was negative. The patient tolerated the procedure well. There were no operative complications. The patient was then transferred to the recovery room in stable condition.  Complications: None  Specimen: None  EBL: None  Prosthetic device: Hoya iSert 250, power 18.5 D, SN  [id number].

## 2016-03-04 NOTE — Anesthesia Procedure Notes (Signed)
Procedure Name: MAC Date/Time: 03/04/2016 1:28 PM Performed by: Pernell DupreADAMS, AMY A Pre-anesthesia Checklist: Patient identified, Timeout performed, Emergency Drugs available, Suction available and Patient being monitored Oxygen Delivery Method: Nasal cannula

## 2016-03-04 NOTE — Transfer of Care (Signed)
Immediate Anesthesia Transfer of Care Note  Patient: Tyler CuffAlbert J Galloway  Procedure(s) Performed: Procedure(s) with comments: CATARACT EXTRACTION PHACO AND INTRAOCULAR LENS PLACEMENT (IOC) (Right) - CDE:5.43  Patient Location: Short Stay  Anesthesia Type:MAC  Level of Consciousness: awake, alert , oriented and patient cooperative  Airway & Oxygen Therapy: Patient Spontanous Breathing  Post-op Assessment: Report given to RN and Post -op Vital signs reviewed and stable  Post vital signs: Reviewed and stable  Last Vitals:  Filed Vitals:   03/04/16 1320 03/04/16 1325  BP: 104/64 109/72  Pulse:    Temp:    Resp: 22 20    Complications: No apparent anesthesia complications

## 2016-03-04 NOTE — Anesthesia Postprocedure Evaluation (Signed)
Anesthesia Post Note  Patient: [name]  Procedure(s) Performed: Procedure(s) (LRB): CATARACT EXTRACTION PHACO AND INTRAOCULAR LENS PLACEMENT (IOC) (Right)  Patient location during evaluation: Short Stay Anesthesia Type: MAC Level of consciousness: awake and alert and oriented Pain management: pain level controlled Vital Signs Assessment: post-procedure vital signs reviewed and stable Respiratory status: spontaneous breathing and respiratory function stable Cardiovascular status: stable Postop Assessment: no signs of nausea or vomiting Anesthetic complications: no    Last Vitals:  Filed Vitals:   03/04/16 1320 03/04/16 1325  BP: 104/64 109/72  Pulse:    Temp:    Resp: 22 20    Last Pain: There were no vitals filed for this visit.               ADAMS, AMY A

## 2016-03-04 NOTE — H&P (Signed)
I have reviewed the H&P, the patient was re-examined, and I have identified no interval changes in medical condition and plan of care since the history and physical of record  

## 2016-03-04 NOTE — Anesthesia Preprocedure Evaluation (Addendum)
Anesthesia Evaluation  Patient identified by MRN, date of birth, ID band Patient awake    Reviewed: Allergy & Precautions, H&P , NPO status , Patient's Chart, lab work & pertinent test results, reviewed documented beta blocker date and time   Airway Mallampati: II  TM Distance: >3 FB Neck ROM: Full    Dental no notable dental hx. (+) Teeth Intact, Dental Advisory Given   Pulmonary neg pulmonary ROS, COPD, former smoker,    Pulmonary exam normal breath sounds clear to auscultation       Cardiovascular hypertension, Pt. on home beta blockers and Pt. on medications + CAD and + Past MI  + dysrhythmias Atrial Fibrillation  Rhythm:Regular Rate:Normal  S/p AVR   Neuro/Psych negative neurological ROS  negative psych ROS   GI/Hepatic negative GI ROS, Neg liver ROS,   Endo/Other  negative endocrine ROS  Renal/GU negative Renal ROS  negative genitourinary   Musculoskeletal   Abdominal   Peds  Hematology negative hematology ROS (+)   Anesthesia Other Findings   Reproductive/Obstetrics negative OB ROS                            Anesthesia Physical Anesthesia Plan  ASA: III  Anesthesia Plan: MAC   Post-op Pain Management:    Induction: Intravenous  Airway Management Planned: Nasal Cannula  Additional Equipment:   Intra-op Plan:   Post-operative Plan:   Informed Consent: I have reviewed the patients History and Physical, chart, labs and discussed the procedure including the risks, benefits and alternatives for the proposed anesthesia with the patient or authorized representative who has indicated his/her understanding and acceptance.     Plan Discussed with:   Anesthesia Plan Comments:         Anesthesia Quick Evaluation

## 2016-03-04 NOTE — Discharge Instructions (Signed)

## 2016-03-05 ENCOUNTER — Encounter (HOSPITAL_COMMUNITY): Payer: Self-pay | Admitting: Ophthalmology

## 2016-03-11 ENCOUNTER — Ambulatory Visit (INDEPENDENT_AMBULATORY_CARE_PROVIDER_SITE_OTHER): Payer: Medicare Other | Admitting: *Deleted

## 2016-03-11 DIAGNOSIS — Z5181 Encounter for therapeutic drug level monitoring: Secondary | ICD-10-CM

## 2016-03-11 DIAGNOSIS — I359 Nonrheumatic aortic valve disorder, unspecified: Secondary | ICD-10-CM | POA: Diagnosis not present

## 2016-03-11 DIAGNOSIS — I4891 Unspecified atrial fibrillation: Secondary | ICD-10-CM

## 2016-03-11 LAB — POCT INR: INR: 2.4

## 2016-04-03 ENCOUNTER — Ambulatory Visit (INDEPENDENT_AMBULATORY_CARE_PROVIDER_SITE_OTHER): Payer: Medicare Other | Admitting: *Deleted

## 2016-04-03 DIAGNOSIS — I4891 Unspecified atrial fibrillation: Secondary | ICD-10-CM

## 2016-04-03 DIAGNOSIS — Z5181 Encounter for therapeutic drug level monitoring: Secondary | ICD-10-CM | POA: Diagnosis not present

## 2016-04-03 DIAGNOSIS — I359 Nonrheumatic aortic valve disorder, unspecified: Secondary | ICD-10-CM

## 2016-04-03 LAB — POCT INR: INR: 2

## 2016-05-01 ENCOUNTER — Ambulatory Visit (INDEPENDENT_AMBULATORY_CARE_PROVIDER_SITE_OTHER): Payer: Medicare Other | Admitting: *Deleted

## 2016-05-01 DIAGNOSIS — Z5181 Encounter for therapeutic drug level monitoring: Secondary | ICD-10-CM | POA: Diagnosis not present

## 2016-05-01 DIAGNOSIS — I4891 Unspecified atrial fibrillation: Secondary | ICD-10-CM

## 2016-05-01 DIAGNOSIS — I359 Nonrheumatic aortic valve disorder, unspecified: Secondary | ICD-10-CM

## 2016-05-01 LAB — POCT INR: INR: 3.6

## 2016-05-27 ENCOUNTER — Ambulatory Visit (INDEPENDENT_AMBULATORY_CARE_PROVIDER_SITE_OTHER): Payer: Medicare Other | Admitting: *Deleted

## 2016-05-27 DIAGNOSIS — Z5181 Encounter for therapeutic drug level monitoring: Secondary | ICD-10-CM

## 2016-05-27 DIAGNOSIS — I4891 Unspecified atrial fibrillation: Secondary | ICD-10-CM

## 2016-05-27 DIAGNOSIS — I359 Nonrheumatic aortic valve disorder, unspecified: Secondary | ICD-10-CM

## 2016-05-27 LAB — POCT INR: INR: 2.5

## 2016-06-24 ENCOUNTER — Ambulatory Visit (INDEPENDENT_AMBULATORY_CARE_PROVIDER_SITE_OTHER): Payer: Medicare Other | Admitting: *Deleted

## 2016-06-24 DIAGNOSIS — Z5181 Encounter for therapeutic drug level monitoring: Secondary | ICD-10-CM | POA: Diagnosis not present

## 2016-06-24 DIAGNOSIS — I359 Nonrheumatic aortic valve disorder, unspecified: Secondary | ICD-10-CM

## 2016-06-24 DIAGNOSIS — I4891 Unspecified atrial fibrillation: Secondary | ICD-10-CM | POA: Diagnosis not present

## 2016-06-24 LAB — POCT INR: INR: 1.7

## 2016-07-10 ENCOUNTER — Ambulatory Visit (INDEPENDENT_AMBULATORY_CARE_PROVIDER_SITE_OTHER): Payer: Medicare Other | Admitting: *Deleted

## 2016-07-10 DIAGNOSIS — Z5181 Encounter for therapeutic drug level monitoring: Secondary | ICD-10-CM

## 2016-07-10 DIAGNOSIS — I359 Nonrheumatic aortic valve disorder, unspecified: Secondary | ICD-10-CM | POA: Diagnosis not present

## 2016-07-10 DIAGNOSIS — I4891 Unspecified atrial fibrillation: Secondary | ICD-10-CM

## 2016-07-10 LAB — POCT INR: INR: 2.9

## 2016-07-11 ENCOUNTER — Encounter: Payer: Self-pay | Admitting: Cardiology

## 2016-07-16 ENCOUNTER — Other Ambulatory Visit: Payer: Self-pay | Admitting: Cardiology

## 2016-07-29 ENCOUNTER — Other Ambulatory Visit: Payer: Self-pay

## 2016-07-29 ENCOUNTER — Ambulatory Visit (INDEPENDENT_AMBULATORY_CARE_PROVIDER_SITE_OTHER): Payer: Medicare Other | Admitting: Cardiology

## 2016-07-29 ENCOUNTER — Encounter: Payer: Self-pay | Admitting: Cardiology

## 2016-07-29 ENCOUNTER — Ambulatory Visit (HOSPITAL_COMMUNITY): Payer: Medicare Other | Attending: Cardiology

## 2016-07-29 VITALS — BP 120/60 | HR 88 | Ht 71.0 in | Wt 188.0 lb

## 2016-07-29 DIAGNOSIS — Z954 Presence of other heart-valve replacement: Secondary | ICD-10-CM | POA: Insufficient documentation

## 2016-07-29 DIAGNOSIS — I482 Chronic atrial fibrillation, unspecified: Secondary | ICD-10-CM

## 2016-07-29 DIAGNOSIS — I2510 Atherosclerotic heart disease of native coronary artery without angina pectoris: Secondary | ICD-10-CM | POA: Diagnosis not present

## 2016-07-29 DIAGNOSIS — I480 Paroxysmal atrial fibrillation: Secondary | ICD-10-CM

## 2016-07-29 DIAGNOSIS — Z952 Presence of prosthetic heart valve: Secondary | ICD-10-CM

## 2016-07-29 LAB — CBC WITH DIFFERENTIAL/PLATELET
BASOS PCT: 1 %
Basophils Absolute: 50 cells/uL (ref 0–200)
EOS ABS: 200 {cells}/uL (ref 15–500)
Eosinophils Relative: 4 %
HCT: 37.8 % — ABNORMAL LOW (ref 38.5–50.0)
Hemoglobin: 12.8 g/dL — ABNORMAL LOW (ref 13.2–17.1)
LYMPHS PCT: 16 %
Lymphs Abs: 800 cells/uL (ref 850–3900)
MCH: 33.4 pg — AB (ref 27.0–33.0)
MCHC: 33.9 g/dL (ref 32.0–36.0)
MCV: 98.7 fL (ref 80.0–100.0)
MONOS PCT: 13 %
MPV: 9.5 fL (ref 7.5–12.5)
Monocytes Absolute: 650 cells/uL (ref 200–950)
Neutro Abs: 3300 cells/uL (ref 1500–7800)
Neutrophils Relative %: 66 %
PLATELETS: 194 10*3/uL (ref 140–400)
RBC: 3.83 MIL/uL — ABNORMAL LOW (ref 4.20–5.80)
RDW: 14.1 % (ref 11.0–15.0)
WBC: 5 10*3/uL (ref 3.8–10.8)

## 2016-07-29 MED ORDER — METOPROLOL SUCCINATE ER 50 MG PO TB24: 50.0000 mg | ORAL_TABLET | Freq: Every day | ORAL | 3 refills | Status: DC

## 2016-07-29 NOTE — Patient Instructions (Signed)
Medication Instructions:  Your physician recommends that you continue on your current medications as directed. Please refer to the Current Medication list given to you today.   Labwork: BMET/CBCd /Lipid profile today  Testing/Procedures: None   Follow-Up: Your physician wants you to follow-up in: 1 year with Dr Diona BrownerMcDowell in [place]. (September 2018).  You will receive a reminder letter in the mail two months in advance. If you don't receive a letter, please call our office to schedule the follow-up appointment.        If you need a refill on your cardiac medications before your next appointment, please call your pharmacy.

## 2016-07-29 NOTE — Progress Notes (Signed)
Patient ID: [name], male   DOB: 1946-06-23, 70 y.o.   MRN: [id number] PCP: Dr. Catalina Pizza  70 yo with history of atrial fibrillation, cardioembolic MI, and aortic stenosis s/p aortic valve replacement presents for cardiology followup.  Echo today was reviewed and showed EF 45-50% with well-seated bioprosthetic aortic valve.  No chest pain, no exertional dyspnea.  He remains in atrial fibrillation.  He has good exercise tolerance.  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  He continues to golf at least once a week.  No BRBPR or melena.   ECG: atrial fibrillation, PVCs, right axis deviation  Labs (1/12): K 4.7, creatinine 1.1, HDL 71, LDL 72 Labs (5/12): K 4.3, creatinine 0.81 Labs (7/12): LDL 47, HDL 55 Labs (8/12): K 4.5, creatinine 0.78 Labs (1/13): LDL 66, HDL 55, K 4.6, creatinine 1.1 Labs (1/14): K 4.5, creatinine 1.0, LDL 81, HDL 49 Labs (12/14): K 4.1, creatinine 1.0, hgb 10.9 Labs (3/15): K 4.6, creatinine 1.09 Labs (8/15): LDL 57, HDL 57, HCT 40.9 Labs (9/16): LDL 39, HDL 52 Labs (4/17): K 4.5, creatinine 0.96  Allergies (verified):  1)  ! * Diclofenac  Past Medical History: 1. Myocardial infarction: Patient had an inferoposterior MI in 6/11 complicated by ventricular fibrillation arrest.  LHC showed occlusion of OM1 and and left-sided PDA.  Other vessels showed no angiographic CAD.  This was thought to be embolic and was treated with heparin gtt.  He was actually found soon thereafter to have extensive left atrial thrombus. TEE (8/11) showed EF 50%, well-seated bioprosthetic aortic valve.  Echo (7/12) showed moderate biatrial enlargement, EF 45-50% with severe inferoposterior hypokinesis, normal RV size and systolic function, normal functioning bioprosthetic aortic valve. Echo (7/13) with EF 50-55%, bioprosthetic aortic valve with mean gradient 8 mmHg. Echo (8/15) with EF 50-55%, inferior hypokinesis, normal bioprosthetic aortic valve.  - Echo  (9/17) with EF 45-50%, mid inferior severe hypokinesis, bioprosthetic aortic valve looked normal.  2. Ventricular fibrillation arrest in setting of acute MI.  3. Aortic stenosis: Severe AS.  Patient had AV replacement with bioprosthetic aortic valve in 6/11.  Valve looked ok on 9/17 echo.  4. Atrial fibrillation/flutter: Complicated by cardioembolic MI.  At time of aortic valve replacement in 6/11, was noted to have extensive left atrial thrombus.  Thrombus was removed and patient had Maze procedure and left atrial appendage ligation.  Atrial fibrillation recurred and patient had 2 cardioversions while on amiodarone, neither succeeded.  He is now off amiodarone and rate controlled with coumadin anticoagulation.  5. Gout 6. History of pneumonia.  7. Diverticulosis.  8. 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 Right inguinal hernia repair 2014 10. Carpal tunnel release 2014    Family History: No premature CAD  Social History: Lives in [place] with his wife.  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  Rare ETOH.  Tobacco Use - Former. pt no longer smokes cigars quite in April 24, 2010 Smoking Status:  Quit  ROS:  All systems reviewed and negative except as per HPI.    Current Outpatient Prescriptions  Medication Sig Dispense Refill  . acetaminophen (TYLENOL) 500 MG tablet Take 1,000 mg by mouth daily as needed (arthritis pain). For knee pain.    Marland Kitchen allopurinol (ZYLOPRIM) 300 MG tablet Take 300 mg by mouth daily.    Marland Kitchen lisinopril (PRINIVIL,ZESTRIL) 5 MG tablet TAKE ONE (1) TABLET EACH DAY 90 tablet 3  . metoprolol succinate (TOPROL-XL) 50 MG 24 hr tablet Take  1 tablet (50 mg total) by mouth daily. Take with or immediately following a meal. 90 tablet 3  . warfarin (COUMADIN) 5 MG tablet Take 2 tablets daily except 1 1/2 tablets on Saturday 75 tablet 4   No current facility-administered medications for this visit.     BP 120/60   Pulse 88   Ht 5\' 11"  (1.803 m)   Wt 188 lb (85.3 kg)   BMI 26.22 kg/m   General:  Well developed, well nourished, in no acute distress. Neck:  Neck supple, no JVD. No masses, thyromegaly or abnormal cervical nodes. Lungs:  Bilateral mild rhonchi. Heart:  Non-displaced PMI, chest non-tender; irregular rate and rhythm, S1, S2 without rubs or gallops. 2/6 early systolic murmur RUSB.  Carotid upstroke normal, no bruit. Pedals normal pulses. No ankle edema.  Abdomen:  Bowel sounds positive; abdomen soft and non-tender without masses, organomegaly, or hernias noted. No hepatosplenomegaly. Extremities:  No clubbing or cyanosis. Neurologic:  Alert and oriented x 3. Psych:  Normal affect.  Assessment/Plan: 1. Atrial fibrillation: Chronic.  Continue warfarin and Toprol XL.  Check CBC today.  2. Bioprosthetic aortic valve: I reviewed today's echo, valve looks well-seated with no stenosis or regurgitation.  3. MI: Prior MI thought to be cardioembolic in setting of atrial fibrillation (not on anticoagulation).  He is on warfarin, should get bridging Lovenox if he has to come off warfarin.  Echo today showed EF 45-50% with mid inferior wall motion abnormality.  This is similar to prior echo.  He is on ACEI and Toprol XL.  4. Check lipids today.   Marca AnconaDalton Richie Vadala 07/29/2016

## 2016-07-30 ENCOUNTER — Telehealth: Payer: Self-pay | Admitting: Cardiology

## 2016-07-30 DIAGNOSIS — I2510 Atherosclerotic heart disease of native coronary artery without angina pectoris: Secondary | ICD-10-CM

## 2016-07-30 DIAGNOSIS — I480 Paroxysmal atrial fibrillation: Secondary | ICD-10-CM

## 2016-07-30 LAB — BASIC METABOLIC PANEL
BUN: 22 mg/dL (ref 7–25)
CALCIUM: 9.4 mg/dL (ref 8.6–10.3)
CHLORIDE: 100 mmol/L (ref 98–110)
CO2: 25 mmol/L (ref 20–31)
CREATININE: 0.85 mg/dL (ref 0.70–1.18)
Glucose, Bld: 115 mg/dL — ABNORMAL HIGH (ref 65–99)
Potassium: 4.3 mmol/L (ref 3.5–5.3)
Sodium: 136 mmol/L (ref 135–146)

## 2016-07-30 LAB — LIPID PANEL
CHOLESTEROL: 116 mg/dL — AB (ref 125–200)
HDL: 60 mg/dL (ref >=40)
LDL Cholesterol: 44 mg/dL (ref <130)
TRIGLYCERIDES: 60 mg/dL (ref <150)
Total CHOL/HDL Ratio: 1.9 Ratio (ref <=5.0)
VLDL: 12 mg/dL (ref <30)

## 2016-07-30 MED ORDER — METOPROLOL SUCCINATE ER 50 MG PO TB24: 50.0000 mg | ORAL_TABLET | Freq: Two times a day (BID) | ORAL | 11 refills | Status: DC

## 2016-07-30 NOTE — Telephone Encounter (Signed)
Follow Up:    Question about his directions for his Metoprolol that was sent in yesterday please.

## 2016-07-30 NOTE — Telephone Encounter (Signed)
Tammy states pt has been taking metoprolol succinate 50mg  bid. Tammy advised dose not changed yesterday, will send in refill for 50mg  bid if that is what pt has been taking.

## 2016-07-30 NOTE — Telephone Encounter (Signed)
LMTCB

## 2016-08-07 ENCOUNTER — Ambulatory Visit (INDEPENDENT_AMBULATORY_CARE_PROVIDER_SITE_OTHER): Payer: Medicare Other | Admitting: *Deleted

## 2016-08-07 DIAGNOSIS — I359 Nonrheumatic aortic valve disorder, unspecified: Secondary | ICD-10-CM

## 2016-08-07 DIAGNOSIS — I4891 Unspecified atrial fibrillation: Secondary | ICD-10-CM | POA: Diagnosis not present

## 2016-08-07 DIAGNOSIS — Z5181 Encounter for therapeutic drug level monitoring: Secondary | ICD-10-CM | POA: Diagnosis not present

## 2016-08-07 LAB — POCT INR: INR: 2.7

## 2016-08-19 DIAGNOSIS — R0981 Nasal congestion: Secondary | ICD-10-CM | POA: Diagnosis not present

## 2016-08-19 DIAGNOSIS — J060 Acute laryngopharyngitis: Secondary | ICD-10-CM | POA: Diagnosis not present

## 2016-08-19 DIAGNOSIS — R05 Cough: Secondary | ICD-10-CM | POA: Diagnosis not present

## 2016-08-23 ENCOUNTER — Telehealth: Payer: Self-pay | Admitting: Cardiology

## 2016-08-23 NOTE — Telephone Encounter (Signed)
[name] is calling because he receive a msg from Dr. Shirlee LatchMclean nurse that his medicine was ready , but he is not what it is . Wants to did Dr.  Shirlee LatchMclean give him a new prescription ? Please call   Thanks

## 2016-08-23 NOTE — Telephone Encounter (Addendum)
Patient tells me that he received a message on his cell phone that his medication/s were ready.  Advised patient that I do not see anything in his chart that our office called about a medication/s.  Advised patient to contact his pharmacy to see if they were informing him of rx ready.  Patient verbalized understanding and agreeable to plan. He thanks me for calling back.

## 2016-08-24 ENCOUNTER — Other Ambulatory Visit: Payer: Self-pay | Admitting: Cardiology

## 2016-08-26 ENCOUNTER — Telehealth: Payer: Self-pay | Admitting: *Deleted

## 2016-08-26 ENCOUNTER — Other Ambulatory Visit: Payer: Self-pay | Admitting: Cardiology

## 2016-08-26 NOTE — Telephone Encounter (Signed)
Pt took levaquin x 7 days last week for congestion.  Has finished it.  Told pt to eat extra greens and salads till INR check on 10/25.  Offered appt today but pt wants to wait.

## 2016-08-26 NOTE — Telephone Encounter (Signed)
Please give [name] a call @ [phone]

## 2016-08-26 NOTE — Telephone Encounter (Signed)
I spoke to [place] PharmD. She states they are squared away, rx came through earlier today.

## 2016-08-26 NOTE — Telephone Encounter (Signed)
Phone still not in service .

## 2016-08-26 NOTE — Telephone Encounter (Signed)
Melissa from the pharmacy is calling to get a refill rx for Lisinatrin 5mg  they were broken into so they are unable to get escripts and faxes at the moment. She can be reached [phone].

## 2016-08-26 NOTE — Telephone Encounter (Signed)
Called the [place] pharmacy x 3 in 15 minutes phone sounds busy. Will tray again later.

## 2016-09-04 ENCOUNTER — Ambulatory Visit (INDEPENDENT_AMBULATORY_CARE_PROVIDER_SITE_OTHER): Payer: Medicare Other | Admitting: *Deleted

## 2016-09-04 DIAGNOSIS — I359 Nonrheumatic aortic valve disorder, unspecified: Secondary | ICD-10-CM

## 2016-09-04 DIAGNOSIS — Z5181 Encounter for therapeutic drug level monitoring: Secondary | ICD-10-CM | POA: Diagnosis not present

## 2016-09-04 DIAGNOSIS — I4891 Unspecified atrial fibrillation: Secondary | ICD-10-CM

## 2016-09-04 LAB — POCT INR: INR: 1.7

## 2016-09-25 ENCOUNTER — Ambulatory Visit (INDEPENDENT_AMBULATORY_CARE_PROVIDER_SITE_OTHER): Payer: Medicare Other | Admitting: *Deleted

## 2016-09-25 DIAGNOSIS — I359 Nonrheumatic aortic valve disorder, unspecified: Secondary | ICD-10-CM | POA: Diagnosis not present

## 2016-09-25 DIAGNOSIS — I4891 Unspecified atrial fibrillation: Secondary | ICD-10-CM | POA: Diagnosis not present

## 2016-09-25 DIAGNOSIS — Z5181 Encounter for therapeutic drug level monitoring: Secondary | ICD-10-CM | POA: Diagnosis not present

## 2016-09-25 LAB — POCT INR: INR: 4.9

## 2016-10-09 ENCOUNTER — Ambulatory Visit (INDEPENDENT_AMBULATORY_CARE_PROVIDER_SITE_OTHER): Payer: Medicare Other | Admitting: *Deleted

## 2016-10-09 DIAGNOSIS — J060 Acute laryngopharyngitis: Secondary | ICD-10-CM | POA: Diagnosis not present

## 2016-10-09 DIAGNOSIS — I359 Nonrheumatic aortic valve disorder, unspecified: Secondary | ICD-10-CM | POA: Diagnosis not present

## 2016-10-09 DIAGNOSIS — R05 Cough: Secondary | ICD-10-CM | POA: Diagnosis not present

## 2016-10-09 DIAGNOSIS — Z5181 Encounter for therapeutic drug level monitoring: Secondary | ICD-10-CM | POA: Diagnosis not present

## 2016-10-09 DIAGNOSIS — I4891 Unspecified atrial fibrillation: Secondary | ICD-10-CM | POA: Diagnosis not present

## 2016-10-09 LAB — POCT INR: INR: 2.6

## 2016-10-24 DIAGNOSIS — J060 Acute laryngopharyngitis: Secondary | ICD-10-CM | POA: Diagnosis not present

## 2016-10-24 DIAGNOSIS — J449 Chronic obstructive pulmonary disease, unspecified: Secondary | ICD-10-CM | POA: Diagnosis not present

## 2016-10-30 ENCOUNTER — Ambulatory Visit (INDEPENDENT_AMBULATORY_CARE_PROVIDER_SITE_OTHER): Payer: Medicare Other | Admitting: *Deleted

## 2016-10-30 DIAGNOSIS — I4891 Unspecified atrial fibrillation: Secondary | ICD-10-CM | POA: Diagnosis not present

## 2016-10-30 DIAGNOSIS — I359 Nonrheumatic aortic valve disorder, unspecified: Secondary | ICD-10-CM | POA: Diagnosis not present

## 2016-10-30 DIAGNOSIS — Z5181 Encounter for therapeutic drug level monitoring: Secondary | ICD-10-CM | POA: Diagnosis not present

## 2016-10-30 LAB — POCT INR: INR: 3.1

## 2016-11-25 ENCOUNTER — Ambulatory Visit (INDEPENDENT_AMBULATORY_CARE_PROVIDER_SITE_OTHER): Payer: Medicare Other | Admitting: *Deleted

## 2016-11-25 DIAGNOSIS — I359 Nonrheumatic aortic valve disorder, unspecified: Secondary | ICD-10-CM

## 2016-11-25 DIAGNOSIS — I4891 Unspecified atrial fibrillation: Secondary | ICD-10-CM | POA: Diagnosis not present

## 2016-11-25 DIAGNOSIS — Z5181 Encounter for therapeutic drug level monitoring: Secondary | ICD-10-CM | POA: Diagnosis not present

## 2016-11-25 LAB — POCT INR: INR: 2.3

## 2016-12-23 ENCOUNTER — Ambulatory Visit (INDEPENDENT_AMBULATORY_CARE_PROVIDER_SITE_OTHER): Payer: Medicare Other | Admitting: *Deleted

## 2016-12-23 DIAGNOSIS — I359 Nonrheumatic aortic valve disorder, unspecified: Secondary | ICD-10-CM | POA: Diagnosis not present

## 2016-12-23 DIAGNOSIS — Z5181 Encounter for therapeutic drug level monitoring: Secondary | ICD-10-CM | POA: Diagnosis not present

## 2016-12-23 DIAGNOSIS — I4891 Unspecified atrial fibrillation: Secondary | ICD-10-CM | POA: Diagnosis not present

## 2016-12-23 LAB — POCT INR: INR: 2.9

## 2017-01-20 ENCOUNTER — Ambulatory Visit (INDEPENDENT_AMBULATORY_CARE_PROVIDER_SITE_OTHER): Payer: Medicare Other | Admitting: *Deleted

## 2017-01-20 DIAGNOSIS — I4891 Unspecified atrial fibrillation: Secondary | ICD-10-CM

## 2017-01-20 DIAGNOSIS — Z5181 Encounter for therapeutic drug level monitoring: Secondary | ICD-10-CM

## 2017-01-20 DIAGNOSIS — I359 Nonrheumatic aortic valve disorder, unspecified: Secondary | ICD-10-CM

## 2017-01-20 LAB — POCT INR: INR: 2.9

## 2017-01-28 ENCOUNTER — Other Ambulatory Visit: Payer: Self-pay | Admitting: Cardiology

## 2017-02-03 DIAGNOSIS — I340 Nonrheumatic mitral (valve) insufficiency: Secondary | ICD-10-CM | POA: Diagnosis not present

## 2017-02-03 DIAGNOSIS — Z7901 Long term (current) use of anticoagulants: Secondary | ICD-10-CM | POA: Diagnosis not present

## 2017-02-03 DIAGNOSIS — S2241XD Multiple fractures of ribs, right side, subsequent encounter for fracture with routine healing: Secondary | ICD-10-CM | POA: Diagnosis not present

## 2017-02-05 ENCOUNTER — Ambulatory Visit (HOSPITAL_COMMUNITY)
Admission: RE | Admit: 2017-02-05 | Discharge: 2017-02-05 | Disposition: A | Discharge status: Home / Self Care | Payer: Medicare Other | Source: Ambulatory Visit | Attending: Internal Medicine | Admitting: Internal Medicine

## 2017-02-05 ENCOUNTER — Other Ambulatory Visit: Payer: Self-pay | Admitting: Internal Medicine

## 2017-02-05 DIAGNOSIS — R918 Other nonspecific abnormal finding of lung field: Secondary | ICD-10-CM | POA: Diagnosis not present

## 2017-02-05 DIAGNOSIS — S2241XA Multiple fractures of ribs, right side, initial encounter for closed fracture: Secondary | ICD-10-CM | POA: Diagnosis not present

## 2017-02-05 DIAGNOSIS — R042 Hemoptysis: Secondary | ICD-10-CM | POA: Diagnosis not present

## 2017-02-05 DIAGNOSIS — W19XXXA Unspecified fall, initial encounter: Secondary | ICD-10-CM | POA: Diagnosis not present

## 2017-02-05 DIAGNOSIS — I700 Atherosclerosis of aorta: Secondary | ICD-10-CM | POA: Diagnosis not present

## 2017-02-10 DIAGNOSIS — I340 Nonrheumatic mitral (valve) insufficiency: Secondary | ICD-10-CM | POA: Diagnosis not present

## 2017-02-10 DIAGNOSIS — Z7901 Long term (current) use of anticoagulants: Secondary | ICD-10-CM | POA: Diagnosis not present

## 2017-03-03 ENCOUNTER — Ambulatory Visit (INDEPENDENT_AMBULATORY_CARE_PROVIDER_SITE_OTHER): Payer: Medicare Other | Admitting: *Deleted

## 2017-03-03 DIAGNOSIS — I359 Nonrheumatic aortic valve disorder, unspecified: Secondary | ICD-10-CM | POA: Diagnosis not present

## 2017-03-03 DIAGNOSIS — I4891 Unspecified atrial fibrillation: Secondary | ICD-10-CM

## 2017-03-03 DIAGNOSIS — Z5181 Encounter for therapeutic drug level monitoring: Secondary | ICD-10-CM

## 2017-03-03 LAB — POCT INR: INR: 3.4

## 2017-03-31 ENCOUNTER — Ambulatory Visit (INDEPENDENT_AMBULATORY_CARE_PROVIDER_SITE_OTHER): Payer: Medicare Other | Admitting: *Deleted

## 2017-03-31 DIAGNOSIS — Z5181 Encounter for therapeutic drug level monitoring: Secondary | ICD-10-CM

## 2017-03-31 DIAGNOSIS — I4891 Unspecified atrial fibrillation: Secondary | ICD-10-CM | POA: Diagnosis not present

## 2017-03-31 DIAGNOSIS — I359 Nonrheumatic aortic valve disorder, unspecified: Secondary | ICD-10-CM | POA: Diagnosis not present

## 2017-03-31 LAB — POCT INR: INR: 3.8

## 2017-04-16 ENCOUNTER — Ambulatory Visit (INDEPENDENT_AMBULATORY_CARE_PROVIDER_SITE_OTHER): Payer: Medicare Other | Admitting: *Deleted

## 2017-04-16 DIAGNOSIS — I4891 Unspecified atrial fibrillation: Secondary | ICD-10-CM | POA: Diagnosis not present

## 2017-04-16 DIAGNOSIS — Z5181 Encounter for therapeutic drug level monitoring: Secondary | ICD-10-CM | POA: Diagnosis not present

## 2017-04-16 DIAGNOSIS — I359 Nonrheumatic aortic valve disorder, unspecified: Secondary | ICD-10-CM

## 2017-04-16 LAB — POCT INR: INR: 3.4

## 2017-05-07 ENCOUNTER — Ambulatory Visit (INDEPENDENT_AMBULATORY_CARE_PROVIDER_SITE_OTHER): Payer: Medicare Other | Admitting: *Deleted

## 2017-05-07 DIAGNOSIS — I359 Nonrheumatic aortic valve disorder, unspecified: Secondary | ICD-10-CM | POA: Diagnosis not present

## 2017-05-07 DIAGNOSIS — Z5181 Encounter for therapeutic drug level monitoring: Secondary | ICD-10-CM

## 2017-05-07 DIAGNOSIS — I4891 Unspecified atrial fibrillation: Secondary | ICD-10-CM

## 2017-05-07 LAB — POCT INR: INR: 3.4

## 2017-05-19 DIAGNOSIS — H52223 Regular astigmatism, bilateral: Secondary | ICD-10-CM | POA: Diagnosis not present

## 2017-05-19 DIAGNOSIS — H524 Presbyopia: Secondary | ICD-10-CM | POA: Diagnosis not present

## 2017-05-19 DIAGNOSIS — H5203 Hypermetropia, bilateral: Secondary | ICD-10-CM | POA: Diagnosis not present

## 2017-05-19 DIAGNOSIS — H2512 Age-related nuclear cataract, left eye: Secondary | ICD-10-CM | POA: Diagnosis not present

## 2017-05-28 ENCOUNTER — Ambulatory Visit (INDEPENDENT_AMBULATORY_CARE_PROVIDER_SITE_OTHER): Payer: Medicare Other | Admitting: *Deleted

## 2017-05-28 DIAGNOSIS — Z5181 Encounter for therapeutic drug level monitoring: Secondary | ICD-10-CM

## 2017-05-28 DIAGNOSIS — I359 Nonrheumatic aortic valve disorder, unspecified: Secondary | ICD-10-CM | POA: Diagnosis not present

## 2017-05-28 DIAGNOSIS — I4891 Unspecified atrial fibrillation: Secondary | ICD-10-CM

## 2017-05-28 LAB — POCT INR: INR: 2.4

## 2017-06-25 ENCOUNTER — Ambulatory Visit (INDEPENDENT_AMBULATORY_CARE_PROVIDER_SITE_OTHER): Payer: Medicare Other | Admitting: *Deleted

## 2017-06-25 DIAGNOSIS — Z5181 Encounter for therapeutic drug level monitoring: Secondary | ICD-10-CM

## 2017-06-25 DIAGNOSIS — I4891 Unspecified atrial fibrillation: Secondary | ICD-10-CM

## 2017-06-25 DIAGNOSIS — I359 Nonrheumatic aortic valve disorder, unspecified: Secondary | ICD-10-CM

## 2017-06-25 LAB — POCT INR: INR: 1.5

## 2017-07-02 ENCOUNTER — Ambulatory Visit (INDEPENDENT_AMBULATORY_CARE_PROVIDER_SITE_OTHER): Payer: Medicare Other | Admitting: *Deleted

## 2017-07-02 DIAGNOSIS — I4891 Unspecified atrial fibrillation: Secondary | ICD-10-CM

## 2017-07-02 DIAGNOSIS — I359 Nonrheumatic aortic valve disorder, unspecified: Secondary | ICD-10-CM

## 2017-07-02 DIAGNOSIS — Z5181 Encounter for therapeutic drug level monitoring: Secondary | ICD-10-CM

## 2017-07-02 LAB — POCT INR: INR: 2.5

## 2017-07-23 ENCOUNTER — Encounter: Payer: Self-pay | Admitting: Cardiology

## 2017-07-23 NOTE — Progress Notes (Signed)
Cardiology Office Note  Date: 07/24/2017   ID: [name], [name] 1946/03/08, MRN [id number]  PCP: Benita Stabile, MD  Evaluating Cardiologist: Nona Dell, MD   Chief Complaint  Patient presents with  . Atrial Fibrillation  . Status post AVR    History of Present Illness: [name] is a 72 y.o. male former patient of Dr. Shirlee Latch last seen in September 2017, now establishing follow-up with me in the [place] office. This is my first meeting with him. I reviewed records and updated the chart. He presents today reporting no angina symptoms, no palpitations or syncope, NYHA class II dyspnea with typical activities. He enjoys playing golf. 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 He still helps out occasionally.  I reviewed his medications which are outlined below. He reports compliance.  He follows in the anticoagulation clinic on Coumadin. Last INR was 2.5. He denies any spontaneous bleeding problems.  I personally reviewed his ECG today which shows rate-controlled atrial fibrillation with PVC, low voltage in the limb leads, nonspecific ST-T changes, and poor R-wave progression.  He had a follow-up echocardiogram last year which showed stable bioprosthetic AVR function.  Past Medical History:  Diagnosis Date  . Aortic stenosis    a. s/p bioprosth AVR 04/2010;  b. 05/2012 Echo: EF 50-55%, nl bioprosth fxn w/ gradient, mold dil LA.  Marland Kitchen Atrial fibrillation and flutter (HCC)    a. s/p Cox-Maze @ time of AVR;  b. Previously on Amio and s/p DCCV x 2 ->failed->now rate controlled and on chronic coumadin.  Marland Kitchen CAD (coronary artery disease)    Occluded OM1 and LPDA in the setting of left atrial thrombus and cardioembolic event, otherwise nonobstructive disease 04/2010  . COPD (chronic obstructive pulmonary disease) (HCC)   . Diverticulosis   . DJD (degenerative joint disease)    a. s/p R TKA 2010;  b. s/p L TKA 2013.  Marland Kitchen Essential hypertension   . Gout    . History of pneumonia   . Myocardial infarction (HCC) 04/2010   Cardioembolic with occluded OM1 and left-sided PDA  . 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 Ventricular fibrillation (HCC) 04/2010   VF arrest in setting of thrombotic MI 04/2010  . Wears glasses     Past Surgical History:  Procedure Laterality Date  . Aortic valve replacement with pericardial tissue valve, [place]  05/04/2010  . CARPAL TUNNEL RELEASE Right 10/18/2013   Procedure: RIGHT CARPAL TUNNEL RELEASE;  Surgeon: Nicki Reaper, MD;  Location: [facility];  Service: Orthopedics;  Laterality: Right;  . CARPAL TUNNEL RELEASE Left 02/09/2014   Procedure: CARPAL TUNNEL RELEASE LEFT;  Surgeon: Nicki Reaper, MD;  Location: [facility];  Service: Orthopedics;  Laterality: Left;  . CATARACT EXTRACTION W/PHACO Right 03/04/2016   Procedure: CATARACT EXTRACTION PHACO AND INTRAOCULAR LENS PLACEMENT (IOC);  Surgeon: Gemma Payor, MD;  Location: [facility];  Service: Ophthalmology;  Laterality: Right;  CDE:5.43  . COLONOSCOPY    . INGUINAL HERNIA REPAIR Right 08/25/2013   Procedure: HERNIA REPAIR INGUINAL ADULT;  Surgeon: Shelly Rubenstein, MD;  Location: [facility];  Service: General;  Laterality: Right;  . INSERTION OF MESH Right 08/25/2013   Procedure: INSERTION OF MESH;  Surgeon: Shelly Rubenstein, MD;  Location: [facility];  Service: General;  Laterality: Right;  . RETINAL LASER PROCEDURE     right eye   . Right ankle pinning  Fell from tree w/fracture several years ago.  . Right total knee replacement  04/02/2011  . TOTAL KNEE ARTHROPLASTY  07/20/2012   Procedure: TOTAL KNEE ARTHROPLASTY;  Surgeon: Nilda Simmer, MD;  Location: [facility];  Service: Orthopedics;  Laterality: Left;  left total knee arthroplasty  . TRIGGER FINGER RELEASE Left 02/09/2014   Procedure: RELEASE A-1 PULLEY LEFT SMALL FINGER;  Surgeon: Nicki Reaper, MD;  Location: [facility];  Service: Orthopedics;  Laterality: Left;  .  ULNAR NERVE TRANSPOSITION Right 10/18/2013   Procedure: RIGHT ULNAR NERVE DECOMPRESSION/POSSIBLE TRANSPOSITION;  Surgeon: Nicki Reaper, MD;  Location: [facility];  Service: Orthopedics;  Laterality: Right;  . ULNAR NERVE TRANSPOSITION Left 02/09/2014   Procedure: DECOMPRESSION LEFT ULNAR NERVE ;  Surgeon: Nicki Reaper, MD;  Location: [facility];  Service: Orthopedics;  Laterality: Left;    Current Outpatient Prescriptions  Medication Sig Dispense Refill  . acetaminophen (TYLENOL) 500 MG tablet Take 1,000 mg by mouth daily as needed (arthritis pain). For knee pain.    Marland Kitchen allopurinol (ZYLOPRIM) 300 MG tablet Take 300 mg by mouth daily.    Marland Kitchen lisinopril (PRINIVIL,ZESTRIL) 5 MG tablet TAKE ONE (1) TABLET EACH DAY 90 tablet 3  . metoprolol succinate (TOPROL-XL) 50 MG 24 hr tablet Take 1 tablet (50 mg total) by mouth 2 (two) times daily. Take with or immediately following a meal. 60 tablet 11  . warfarin (COUMADIN) 5 MG tablet TAKE TWO TABLETS DAILY EXCEPT TAKE ONE AND ONE-HALF TABLETS ON SATURDAY 75 tablet 4   No current facility-administered medications for this visit.    Allergies:  Diclofenac and Naproxen   Social History: The patient  reports that he quit smoking about 7 years ago. His smoking use included Cigars. He has never used smokeless tobacco. He reports that he drinks alcohol. He reports that he does not use drugs.   Family History: The patient's family history includes Hypertension in his father.   ROS:  Please see the history of present illness. Otherwise, complete review of systems is positive for erectile dysfunction.  All other systems are reviewed and negative.   Physical Exam: VS:  BP 108/78   Pulse 90   Ht  (1.778 m)   Wt 193 lb (87.5 kg)   SpO2 96%   BMI 27.69 kg/m , BMI Body mass index is 27.69 kg/m.  Wt Readings from Last 3 Encounters:  07/24/17 193 lb (87.5 kg)  07/29/16 188 lb (85.3 kg)  03/04/16 193 lb (87.5 kg)    General:  Patient appears comfortable at rest. HEENT: Conjunctiva and lids normal, oropharynx clear. Neck: Supple, no elevated JVP or carotid bruits, no thyromegaly. Lungs: Clear to auscultation, nonlabored breathing at rest. Cardiac: Irregularly irregular, no S3, soft basal systolic murmur, no pericardial rub. Abdomen: Soft, nontender, bowel sounds present, no guarding or rebound. Extremities: No pitting edema, distal pulses 2+. Skin: Warm and dry. Musculoskeletal: No kyphosis. Neuropsychiatric: Alert and oriented x3, affect grossly appropriate.  ECG: I personally reviewed the tracing from 07/29/2016 which showed atrial fibrillation with frequent PVCs versus aberrantly conducted complexes, nonspecific T-wave changes.  Recent Labwork: 07/29/2016: BUN 22; Creat 0.85; Hemoglobin 12.8; Platelets 194; Potassium 4.3; Sodium 136     Component Value Date/Time   CHOL 116 (L) 07/29/2016 1611   TRIG 60 07/29/2016 1611   HDL 60 07/29/2016 1611   CHOLHDL 1.9 07/29/2016 1611   VLDL 12 07/29/2016 1611   LDLCALC 44 07/29/2016 1611  Other Studies Reviewed Today:  Echocardiogram 07/29/2016: Study Conclusions  - Left ventricle: The cavity size was normal. There was mild   concentric hypertrophy. Systolic function was mildly reduced. The   estimated ejection fraction was in the range of 45% to 50%.   Diffuse hypokinesis. The study was not technically sufficient to   allow evaluation of LV diastolic dysfunction due to atrial   fibrillation. - Aortic valve: A bioprosthetic valve sits well in the aortic   position. Mean gradient (S): 6 mm Hg. Peak gradient (S): 13 mm   Hg. - Mitral valve: Structurally normal valve. There was mild   regurgitation. - Left atrium: The atrium was moderately dilated. - Right ventricle: The cavity size was mildly dilated. Wall   thickness was normal. Systolic function was moderately reduced. - Right atrium: The atrium was moderately dilated. - Tricuspid valve: There was  moderate regurgitation. - Pulmonary arteries: Systolic pressure was mildly increased. PA   peak pressure: 39 mm Hg (S). - Inferior vena cava: The vessel was normal in size. The   respirophasic diameter changes were in the normal range (= 50%),   consistent with normal central venous pressure.  Impressions:  - When compared to the prior study from 06/22/2014, LVEF is mildly   worse, now 45-50% with diffuse hypokinesis.   Transaortic gradients across the bioprosthetic aortic valve are   normal. There is no central AI or paravavular leak.   Mildly decreased RV systolic function. RVSP 36 mmHg.  Assessment and Plan:  1. Chronic atrial fibrillation status post failed attempts at restoring sinus rhythm. He remains on Coumadin for stroke prophylaxis with recent therapeutic INR. He reports no palpitations with good heart rate control on Toprol-XL.  2. CAD with history of cardioembolic ACS back in 2011, known occlusion of the OM1 and PDA. He reports no angina and will continue on medical therapy with plan for observation.  3. Aortic stenosis status post bioprosthetic AVR in 2011. Valve function was normal by echocardiogram last year. He reports no change in stamina or increasing shortness of breath. Continue with observation and plan follow-up echocardiogram next year.  4. Follow-up lipid profile, last LDL was 44. He is not on statin therapy.  5. History of hypertension, on ACE inhibitor and beta blocker. Follow-up BMET.  Current medicines were reviewed with the patient today.   Orders Placed This Encounter  Procedures  . Basic Metabolic Panel (BMET)  . Lipid Profile  . EKG 12-Lead  . ECHOCARDIOGRAM COMPLETE    Disposition: Follow-up in one year, sooner if needed.  Signed, Jonelle SidleSamuel G. McDowell, MD, [facility]ACC 07/24/2017 9:32 AM    [facility] [address], [place], [place] [address] Phone: [phone]; Fax: [phone]

## 2017-07-24 ENCOUNTER — Ambulatory Visit (INDEPENDENT_AMBULATORY_CARE_PROVIDER_SITE_OTHER): Payer: Medicare Other | Admitting: Cardiology

## 2017-07-24 ENCOUNTER — Other Ambulatory Visit (HOSPITAL_COMMUNITY)
Admission: RE | Admit: 2017-07-24 | Discharge: 2017-07-24 | Disposition: A | Discharge status: Home / Self Care | Payer: Medicare Other | Source: Ambulatory Visit | Attending: Cardiology | Admitting: Cardiology

## 2017-07-24 ENCOUNTER — Encounter: Payer: Self-pay | Admitting: Cardiology

## 2017-07-24 VITALS — BP 108/78 | HR 90 | Ht 70.0 in | Wt 193.0 lb

## 2017-07-24 DIAGNOSIS — I10 Essential (primary) hypertension: Secondary | ICD-10-CM

## 2017-07-24 DIAGNOSIS — I482 Chronic atrial fibrillation, unspecified: Secondary | ICD-10-CM

## 2017-07-24 DIAGNOSIS — Z953 Presence of xenogenic heart valve: Secondary | ICD-10-CM | POA: Insufficient documentation

## 2017-07-24 DIAGNOSIS — I2510 Atherosclerotic heart disease of native coronary artery without angina pectoris: Secondary | ICD-10-CM | POA: Insufficient documentation

## 2017-07-24 LAB — LIPID PANEL
Cholesterol: 124 mg/dL (ref 0–200)
HDL: 66 mg/dL (ref >40)
LDL Cholesterol: 51 mg/dL (ref 0–99)
Total CHOL/HDL Ratio: 1.9 RATIO
Triglycerides: 36 mg/dL (ref <150)
VLDL: 7 mg/dL (ref 0–40)

## 2017-07-24 LAB — BASIC METABOLIC PANEL
ANION GAP: 7 (ref 5–15)
BUN: 16 mg/dL (ref 6–20)
CALCIUM: 9.1 mg/dL (ref 8.9–10.3)
CO2: 27 mmol/L (ref 22–32)
Chloride: 96 mmol/L — ABNORMAL LOW (ref 101–111)
Creatinine, Ser: 0.85 mg/dL (ref 0.61–1.24)
GLUCOSE: 96 mg/dL (ref 65–99)
POTASSIUM: 4.6 mmol/L (ref 3.5–5.1)
Sodium: 130 mmol/L — ABNORMAL LOW (ref 135–145)

## 2017-07-24 NOTE — Patient Instructions (Signed)
Your physician wants you to follow-up in: 1 year with Dr.McDowell You will receive a reminder letter in the mail two months in advance. If you don't receive a letter, please call our office to schedule the follow-up appointment.    Your physician recommends that you continue on your current medications as directed. Please refer to the Current Medication list given to you today.    If you need a refill on your cardiac medications before your next appointment, please call your pharmacy.     Get lab work today:BMET,LIPIDS     Your physician has requested that you have an echocardiogram JUST BEFORE NEXT VISIT IN 1 YEAR. Echocardiography is a painless test that uses sound waves to create images of your heart. It provides your doctor with information about the size and shape of your heart and how well your heart's chambers and valves are working. This procedure takes approximately one hour. There are no restrictions for this procedure.          Thank you for choosing [facility] !

## 2017-07-30 ENCOUNTER — Ambulatory Visit (INDEPENDENT_AMBULATORY_CARE_PROVIDER_SITE_OTHER): Payer: Medicare Other | Admitting: *Deleted

## 2017-07-30 DIAGNOSIS — I359 Nonrheumatic aortic valve disorder, unspecified: Secondary | ICD-10-CM

## 2017-07-30 DIAGNOSIS — I4891 Unspecified atrial fibrillation: Secondary | ICD-10-CM

## 2017-07-30 DIAGNOSIS — Z5181 Encounter for therapeutic drug level monitoring: Secondary | ICD-10-CM | POA: Diagnosis not present

## 2017-07-30 LAB — POCT INR: INR: 2

## 2017-08-08 ENCOUNTER — Ambulatory Visit: Payer: Medicare Other | Admitting: Cardiology

## 2017-08-11 ENCOUNTER — Ambulatory Visit: Payer: Medicare Other | Admitting: Cardiology

## 2017-08-13 DIAGNOSIS — I2510 Atherosclerotic heart disease of native coronary artery without angina pectoris: Secondary | ICD-10-CM | POA: Diagnosis not present

## 2017-08-13 DIAGNOSIS — Z125 Encounter for screening for malignant neoplasm of prostate: Secondary | ICD-10-CM | POA: Diagnosis not present

## 2017-08-13 DIAGNOSIS — R7301 Impaired fasting glucose: Secondary | ICD-10-CM | POA: Diagnosis not present

## 2017-08-13 DIAGNOSIS — E785 Hyperlipidemia, unspecified: Secondary | ICD-10-CM | POA: Diagnosis not present

## 2017-08-13 DIAGNOSIS — M1A9XX1 Chronic gout, unspecified, with tophus (tophi): Secondary | ICD-10-CM | POA: Diagnosis not present

## 2017-08-13 DIAGNOSIS — Z23 Encounter for immunization: Secondary | ICD-10-CM | POA: Diagnosis not present

## 2017-08-13 DIAGNOSIS — E871 Hypo-osmolality and hyponatremia: Secondary | ICD-10-CM | POA: Diagnosis not present

## 2017-08-13 DIAGNOSIS — M109 Gout, unspecified: Secondary | ICD-10-CM | POA: Diagnosis not present

## 2017-08-13 DIAGNOSIS — R5383 Other fatigue: Secondary | ICD-10-CM | POA: Diagnosis not present

## 2017-08-13 DIAGNOSIS — Z79899 Other long term (current) drug therapy: Secondary | ICD-10-CM | POA: Diagnosis not present

## 2017-08-16 ENCOUNTER — Other Ambulatory Visit: Payer: Self-pay | Admitting: Cardiology

## 2017-08-27 ENCOUNTER — Ambulatory Visit (INDEPENDENT_AMBULATORY_CARE_PROVIDER_SITE_OTHER): Payer: Medicare Other | Admitting: *Deleted

## 2017-08-27 ENCOUNTER — Other Ambulatory Visit: Payer: Self-pay | Admitting: Cardiology

## 2017-08-27 DIAGNOSIS — I359 Nonrheumatic aortic valve disorder, unspecified: Secondary | ICD-10-CM | POA: Diagnosis not present

## 2017-08-27 DIAGNOSIS — Z5181 Encounter for therapeutic drug level monitoring: Secondary | ICD-10-CM

## 2017-08-27 DIAGNOSIS — I2510 Atherosclerotic heart disease of native coronary artery without angina pectoris: Secondary | ICD-10-CM

## 2017-08-27 DIAGNOSIS — I4891 Unspecified atrial fibrillation: Secondary | ICD-10-CM

## 2017-08-27 DIAGNOSIS — I480 Paroxysmal atrial fibrillation: Secondary | ICD-10-CM

## 2017-08-27 LAB — POCT INR: INR: 1.7

## 2017-08-28 ENCOUNTER — Other Ambulatory Visit: Payer: Self-pay

## 2017-08-28 DIAGNOSIS — I480 Paroxysmal atrial fibrillation: Secondary | ICD-10-CM

## 2017-08-28 DIAGNOSIS — I2510 Atherosclerotic heart disease of native coronary artery without angina pectoris: Secondary | ICD-10-CM

## 2017-08-28 MED ORDER — METOPROLOL SUCCINATE ER 50 MG PO TB24: 50.0000 mg | ORAL_TABLET | Freq: Two times a day (BID) | ORAL | 6 refills | Status: AC

## 2017-09-05 ENCOUNTER — Other Ambulatory Visit: Payer: Self-pay | Admitting: Cardiology

## 2017-09-17 ENCOUNTER — Ambulatory Visit (INDEPENDENT_AMBULATORY_CARE_PROVIDER_SITE_OTHER): Payer: Medicare Other | Admitting: *Deleted

## 2017-09-17 DIAGNOSIS — I359 Nonrheumatic aortic valve disorder, unspecified: Secondary | ICD-10-CM

## 2017-09-17 DIAGNOSIS — I4891 Unspecified atrial fibrillation: Secondary | ICD-10-CM

## 2017-09-17 DIAGNOSIS — Z5181 Encounter for therapeutic drug level monitoring: Secondary | ICD-10-CM | POA: Diagnosis not present

## 2017-09-17 LAB — POCT INR: INR: 3.4

## 2017-10-06 ENCOUNTER — Ambulatory Visit (INDEPENDENT_AMBULATORY_CARE_PROVIDER_SITE_OTHER): Payer: Medicare Other | Admitting: *Deleted

## 2017-10-06 DIAGNOSIS — I359 Nonrheumatic aortic valve disorder, unspecified: Secondary | ICD-10-CM | POA: Diagnosis not present

## 2017-10-06 DIAGNOSIS — Z5181 Encounter for therapeutic drug level monitoring: Secondary | ICD-10-CM

## 2017-10-06 DIAGNOSIS — I4891 Unspecified atrial fibrillation: Secondary | ICD-10-CM | POA: Diagnosis not present

## 2017-10-06 LAB — POCT INR: INR: 2.5

## 2017-10-29 ENCOUNTER — Ambulatory Visit (INDEPENDENT_AMBULATORY_CARE_PROVIDER_SITE_OTHER): Payer: Medicare Other | Admitting: *Deleted

## 2017-10-29 DIAGNOSIS — I359 Nonrheumatic aortic valve disorder, unspecified: Secondary | ICD-10-CM | POA: Diagnosis not present

## 2017-10-29 DIAGNOSIS — I4891 Unspecified atrial fibrillation: Secondary | ICD-10-CM | POA: Diagnosis not present

## 2017-10-29 DIAGNOSIS — Z5181 Encounter for therapeutic drug level monitoring: Secondary | ICD-10-CM

## 2017-10-29 LAB — POCT INR: INR: 2.7

## 2017-11-26 ENCOUNTER — Ambulatory Visit (INDEPENDENT_AMBULATORY_CARE_PROVIDER_SITE_OTHER): Payer: Medicare Other | Admitting: *Deleted

## 2017-11-26 DIAGNOSIS — I359 Nonrheumatic aortic valve disorder, unspecified: Secondary | ICD-10-CM | POA: Diagnosis not present

## 2017-11-26 DIAGNOSIS — I4891 Unspecified atrial fibrillation: Secondary | ICD-10-CM | POA: Diagnosis not present

## 2017-11-26 DIAGNOSIS — Z5181 Encounter for therapeutic drug level monitoring: Secondary | ICD-10-CM | POA: Diagnosis not present

## 2017-11-26 LAB — POCT INR: INR: 3.6

## 2017-11-26 NOTE — Patient Instructions (Signed)
Hold coumadin tonight then resume 2 tablets daily except 1 tablet on Mondays and Fridays. Recheck in 4 wk

## 2017-11-28 DIAGNOSIS — J069 Acute upper respiratory infection, unspecified: Secondary | ICD-10-CM | POA: Diagnosis not present

## 2017-12-02 ENCOUNTER — Inpatient Hospital Stay (HOSPITAL_COMMUNITY): Payer: Medicare Other

## 2017-12-02 ENCOUNTER — Emergency Department (HOSPITAL_COMMUNITY): Payer: Medicare Other

## 2017-12-02 ENCOUNTER — Other Ambulatory Visit: Payer: Self-pay

## 2017-12-02 ENCOUNTER — Inpatient Hospital Stay (HOSPITAL_COMMUNITY)
Admission: EM | Admit: 2017-12-02 | Discharge: 2017-12-13 | DRG: 418 | Disposition: A | Discharge status: Home / Self Care | Payer: Medicare Other | Attending: Internal Medicine | Admitting: Internal Medicine

## 2017-12-02 ENCOUNTER — Encounter (HOSPITAL_COMMUNITY): Payer: Self-pay | Admitting: *Deleted

## 2017-12-02 DIAGNOSIS — Z952 Presence of prosthetic heart valve: Secondary | ICD-10-CM | POA: Diagnosis not present

## 2017-12-02 DIAGNOSIS — Z886 Allergy status to analgesic agent status: Secondary | ICD-10-CM

## 2017-12-02 DIAGNOSIS — Z09 Encounter for follow-up examination after completed treatment for conditions other than malignant neoplasm: Secondary | ICD-10-CM

## 2017-12-02 DIAGNOSIS — D649 Anemia, unspecified: Secondary | ICD-10-CM | POA: Diagnosis present

## 2017-12-02 DIAGNOSIS — R197 Diarrhea, unspecified: Secondary | ICD-10-CM | POA: Diagnosis not present

## 2017-12-02 DIAGNOSIS — I2510 Atherosclerotic heart disease of native coronary artery without angina pectoris: Secondary | ICD-10-CM | POA: Diagnosis not present

## 2017-12-02 DIAGNOSIS — Z978 Presence of other specified devices: Secondary | ICD-10-CM

## 2017-12-02 DIAGNOSIS — K5900 Constipation, unspecified: Secondary | ICD-10-CM | POA: Diagnosis not present

## 2017-12-02 DIAGNOSIS — K567 Ileus, unspecified: Secondary | ICD-10-CM

## 2017-12-02 DIAGNOSIS — I350 Nonrheumatic aortic (valve) stenosis: Secondary | ICD-10-CM | POA: Diagnosis not present

## 2017-12-02 DIAGNOSIS — I482 Chronic atrial fibrillation, unspecified: Secondary | ICD-10-CM

## 2017-12-02 DIAGNOSIS — Z5309 Procedure and treatment not carried out because of other contraindication: Secondary | ICD-10-CM | POA: Diagnosis not present

## 2017-12-02 DIAGNOSIS — K851 Biliary acute pancreatitis without necrosis or infection: Secondary | ICD-10-CM | POA: Diagnosis not present

## 2017-12-02 DIAGNOSIS — R945 Abnormal results of liver function studies: Secondary | ICD-10-CM | POA: Diagnosis not present

## 2017-12-02 DIAGNOSIS — J449 Chronic obstructive pulmonary disease, unspecified: Secondary | ICD-10-CM | POA: Diagnosis present

## 2017-12-02 DIAGNOSIS — R110 Nausea: Secondary | ICD-10-CM

## 2017-12-02 DIAGNOSIS — R7989 Other specified abnormal findings of blood chemistry: Secondary | ICD-10-CM

## 2017-12-02 DIAGNOSIS — K559 Vascular disorder of intestine, unspecified: Secondary | ICD-10-CM | POA: Diagnosis not present

## 2017-12-02 DIAGNOSIS — Z953 Presence of xenogenic heart valve: Secondary | ICD-10-CM

## 2017-12-02 DIAGNOSIS — I493 Ventricular premature depolarization: Secondary | ICD-10-CM | POA: Diagnosis not present

## 2017-12-02 DIAGNOSIS — I252 Old myocardial infarction: Secondary | ICD-10-CM

## 2017-12-02 DIAGNOSIS — R933 Abnormal findings on diagnostic imaging of other parts of digestive tract: Secondary | ICD-10-CM | POA: Diagnosis not present

## 2017-12-02 DIAGNOSIS — Z01810 Encounter for preprocedural cardiovascular examination: Secondary | ICD-10-CM | POA: Diagnosis not present

## 2017-12-02 DIAGNOSIS — Z79899 Other long term (current) drug therapy: Secondary | ICD-10-CM

## 2017-12-02 DIAGNOSIS — Q438 Other specified congenital malformations of intestine: Secondary | ICD-10-CM | POA: Diagnosis not present

## 2017-12-02 DIAGNOSIS — E875 Hyperkalemia: Secondary | ICD-10-CM | POA: Diagnosis not present

## 2017-12-02 DIAGNOSIS — M109 Gout, unspecified: Secondary | ICD-10-CM | POA: Diagnosis not present

## 2017-12-02 DIAGNOSIS — Z4582 Encounter for adjustment or removal of myringotomy device (stent) (tube): Secondary | ICD-10-CM | POA: Diagnosis not present

## 2017-12-02 DIAGNOSIS — R279 Unspecified lack of coordination: Secondary | ICD-10-CM | POA: Diagnosis not present

## 2017-12-02 DIAGNOSIS — R140 Abdominal distension (gaseous): Secondary | ICD-10-CM | POA: Diagnosis not present

## 2017-12-02 DIAGNOSIS — K859 Acute pancreatitis without necrosis or infection, unspecified: Secondary | ICD-10-CM

## 2017-12-02 DIAGNOSIS — K8050 Calculus of bile duct without cholangitis or cholecystitis without obstruction: Secondary | ICD-10-CM

## 2017-12-02 DIAGNOSIS — D689 Coagulation defect, unspecified: Secondary | ICD-10-CM | POA: Diagnosis present

## 2017-12-02 DIAGNOSIS — Z792 Long term (current) use of antibiotics: Secondary | ICD-10-CM

## 2017-12-02 DIAGNOSIS — K8070 Calculus of gallbladder and bile duct without cholecystitis without obstruction: Secondary | ICD-10-CM | POA: Diagnosis not present

## 2017-12-02 DIAGNOSIS — K8010 Calculus of gallbladder with chronic cholecystitis without obstruction: Secondary | ICD-10-CM | POA: Diagnosis not present

## 2017-12-02 DIAGNOSIS — R1010 Upper abdominal pain, unspecified: Secondary | ICD-10-CM | POA: Diagnosis not present

## 2017-12-02 DIAGNOSIS — R9389 Abnormal findings on diagnostic imaging of other specified body structures: Secondary | ICD-10-CM | POA: Diagnosis not present

## 2017-12-02 DIAGNOSIS — I4892 Unspecified atrial flutter: Secondary | ICD-10-CM | POA: Diagnosis not present

## 2017-12-02 DIAGNOSIS — Z96653 Presence of artificial knee joint, bilateral: Secondary | ICD-10-CM | POA: Diagnosis present

## 2017-12-02 DIAGNOSIS — R000 Tachycardia, unspecified: Secondary | ICD-10-CM | POA: Diagnosis not present

## 2017-12-02 DIAGNOSIS — Z8674 Personal history of sudden cardiac arrest: Secondary | ICD-10-CM | POA: Diagnosis not present

## 2017-12-02 DIAGNOSIS — I10 Essential (primary) hypertension: Secondary | ICD-10-CM | POA: Diagnosis not present

## 2017-12-02 DIAGNOSIS — Z87891 Personal history of nicotine dependence: Secondary | ICD-10-CM

## 2017-12-02 DIAGNOSIS — K8020 Calculus of gallbladder without cholecystitis without obstruction: Secondary | ICD-10-CM | POA: Diagnosis not present

## 2017-12-02 DIAGNOSIS — Z9889 Other specified postprocedural states: Secondary | ICD-10-CM

## 2017-12-02 DIAGNOSIS — J9811 Atelectasis: Secondary | ICD-10-CM | POA: Diagnosis not present

## 2017-12-02 DIAGNOSIS — Z4682 Encounter for fitting and adjustment of non-vascular catheter: Secondary | ICD-10-CM | POA: Diagnosis not present

## 2017-12-02 DIAGNOSIS — Z743 Need for continuous supervision: Secondary | ICD-10-CM | POA: Diagnosis not present

## 2017-12-02 DIAGNOSIS — R918 Other nonspecific abnormal finding of lung field: Secondary | ICD-10-CM | POA: Diagnosis not present

## 2017-12-02 DIAGNOSIS — K5939 Other megacolon: Secondary | ICD-10-CM | POA: Diagnosis not present

## 2017-12-02 DIAGNOSIS — Z86718 Personal history of other venous thrombosis and embolism: Secondary | ICD-10-CM

## 2017-12-02 DIAGNOSIS — Z7901 Long term (current) use of anticoagulants: Secondary | ICD-10-CM

## 2017-12-02 DIAGNOSIS — R109 Unspecified abdominal pain: Secondary | ICD-10-CM | POA: Diagnosis not present

## 2017-12-02 LAB — LIPASE, BLOOD: Lipase: 3298 U/L — ABNORMAL HIGH (ref 11–51)

## 2017-12-02 LAB — PROTIME-INR
INR: 2.14
Prothrombin Time: 23.7 seconds — ABNORMAL HIGH (ref 11.4–15.2)

## 2017-12-02 LAB — COMPREHENSIVE METABOLIC PANEL
ALBUMIN: 3.5 g/dL (ref 3.5–5.0)
ALK PHOS: 277 U/L — AB (ref 38–126)
ALT: 86 U/L — AB (ref 17–63)
AST: 250 U/L — AB (ref 15–41)
Anion gap: 13 (ref 5–15)
BILIRUBIN TOTAL: 1.3 mg/dL — AB (ref 0.3–1.2)
BUN: 20 mg/dL (ref 6–20)
CALCIUM: 9.1 mg/dL (ref 8.9–10.3)
CO2: 21 mmol/L — ABNORMAL LOW (ref 22–32)
Chloride: 101 mmol/L (ref 101–111)
Creatinine, Ser: 1.09 mg/dL (ref 0.61–1.24)
GFR calc Af Amer: >60 mL/min (ref >60)
GFR calc non Af Amer: >60 mL/min (ref >60)
GLUCOSE: 161 mg/dL — AB (ref 65–99)
Potassium: 3.5 mmol/L (ref 3.5–5.1)
Sodium: 135 mmol/L (ref 135–145)
TOTAL PROTEIN: 7.4 g/dL (ref 6.5–8.1)

## 2017-12-02 LAB — CBC WITH DIFFERENTIAL/PLATELET
BASOS ABS: 0 10*3/uL (ref 0.0–0.1)
BASOS PCT: 0 %
Eosinophils Absolute: 0 10*3/uL (ref 0.0–0.7)
Eosinophils Relative: 1 %
HEMATOCRIT: 39 % (ref 39.0–52.0)
HEMOGLOBIN: 13.2 g/dL (ref 13.0–17.0)
Lymphocytes Relative: 6 %
Lymphs Abs: 0.5 10*3/uL — ABNORMAL LOW (ref 0.7–4.0)
MCH: 34.3 pg — ABNORMAL HIGH (ref 26.0–34.0)
MCHC: 33.8 g/dL (ref 30.0–36.0)
MCV: 101.3 fL — ABNORMAL HIGH (ref 78.0–100.0)
MONOS PCT: 2 %
Monocytes Absolute: 0.2 10*3/uL (ref 0.1–1.0)
NEUTROS PCT: 91 %
Neutro Abs: 7.7 10*3/uL (ref 1.7–7.7)
Platelets: 221 10*3/uL (ref 150–400)
RBC: 3.85 MIL/uL — ABNORMAL LOW (ref 4.22–5.81)
RDW: 12.6 % (ref 11.5–15.5)
WBC: 8.4 10*3/uL (ref 4.0–10.5)

## 2017-12-02 LAB — HEMOGLOBIN A1C
HEMOGLOBIN A1C: 5.7 % — AB (ref 4.8–5.6)
MEAN PLASMA GLUCOSE: 116.89 mg/dL

## 2017-12-02 LAB — LIPID PANEL
Cholesterol: 131 mg/dL (ref 0–200)
HDL: 62 mg/dL (ref >40)
LDL Cholesterol: 51 mg/dL (ref 0–99)
Total CHOL/HDL Ratio: 2.1 RATIO
Triglycerides: 92 mg/dL (ref <150)
VLDL: 18 mg/dL (ref 0–40)

## 2017-12-02 LAB — I-STAT CG4 LACTIC ACID, ED: Lactic Acid, Venous: 2.85 mmol/L (ref 0.5–1.9)

## 2017-12-02 MED ORDER — SODIUM CHLORIDE 0.9 % IV BOLUS (SEPSIS)
1000.0000 mL | Freq: Once | INTRAVENOUS | Status: AC
  Administered 2017-12-02: 1000 mL via INTRAVENOUS

## 2017-12-02 MED ORDER — ACETAMINOPHEN 650 MG RE SUPP
650.0000 mg | Freq: Four times a day (QID) | RECTAL | Status: DC | PRN
  Administered 2017-12-08 – 2017-12-09 (×2): 650 mg via RECTAL
  Filled 2017-12-02 (×2): qty 1

## 2017-12-02 MED ORDER — SODIUM CHLORIDE 0.9 % IV SOLN: INTRAVENOUS | Status: DC

## 2017-12-02 MED ORDER — ONDANSETRON HCL 4 MG/2ML IJ SOLN
4.0000 mg | Freq: Four times a day (QID) | INTRAMUSCULAR | Status: DC | PRN
  Administered 2017-12-02 – 2017-12-07 (×11): 4 mg via INTRAVENOUS
  Filled 2017-12-02 (×10): qty 2

## 2017-12-02 MED ORDER — HYDROMORPHONE HCL 1 MG/ML IJ SOLN
1.0000 mg | Freq: Once | INTRAMUSCULAR | Status: AC
  Administered 2017-12-02: 1 mg via INTRAVENOUS
  Filled 2017-12-02: qty 1

## 2017-12-02 MED ORDER — GADOBENATE DIMEGLUMINE 529 MG/ML IV SOLN
20.0000 mL | Freq: Once | INTRAVENOUS | Status: AC | PRN
  Administered 2017-12-02: 18 mL via INTRAVENOUS

## 2017-12-02 MED ORDER — PIPERACILLIN-TAZOBACTAM 3.375 G IVPB 30 MIN: 3.3750 g | Freq: Three times a day (TID) | INTRAVENOUS | Status: DC

## 2017-12-02 MED ORDER — MORPHINE SULFATE (PF) 4 MG/ML IV SOLN
INTRAVENOUS | Status: AC
  Filled 2017-12-02: qty 1

## 2017-12-02 MED ORDER — ONDANSETRON HCL 4 MG PO TABS: 4.0000 mg | ORAL_TABLET | Freq: Four times a day (QID) | ORAL | Status: DC | PRN

## 2017-12-02 MED ORDER — HYDROMORPHONE HCL 1 MG/ML IJ SOLN
1.0000 mg | INTRAMUSCULAR | Status: DC | PRN
  Administered 2017-12-02 – 2017-12-06 (×24): 1 mg via INTRAVENOUS
  Filled 2017-12-02 (×25): qty 1

## 2017-12-02 MED ORDER — METOPROLOL TARTRATE 5 MG/5ML IV SOLN
2.5000 mg | Freq: Four times a day (QID) | INTRAVENOUS | Status: DC
Start: 2017-12-02 — End: 2017-12-04
  Administered 2017-12-02 – 2017-12-04 (×6): 2.5 mg via INTRAVENOUS
  Filled 2017-12-02 (×8): qty 5

## 2017-12-02 MED ORDER — IOPAMIDOL (ISOVUE-300) INJECTION 61%
100.0000 mL | Freq: Once | INTRAVENOUS | Status: AC | PRN
  Administered 2017-12-02: 100 mL via INTRAVENOUS

## 2017-12-02 MED ORDER — PROMETHAZINE HCL 25 MG/ML IJ SOLN
12.5000 mg | Freq: Once | INTRAMUSCULAR | Status: AC
  Administered 2017-12-02: 12.5 mg via INTRAVENOUS
  Filled 2017-12-02: qty 1

## 2017-12-02 MED ORDER — ONDANSETRON HCL 4 MG/2ML IJ SOLN
INTRAMUSCULAR | Status: AC
  Administered 2017-12-02: 4 mg via INTRAVENOUS
  Filled 2017-12-02: qty 2

## 2017-12-02 MED ORDER — POTASSIUM CHLORIDE IN NACL 20-0.9 MEQ/L-% IV SOLN
INTRAVENOUS | Status: DC
  Administered 2017-12-02 – 2017-12-10 (×11): via INTRAVENOUS
  Filled 2017-12-02: qty 1000

## 2017-12-02 MED ORDER — MORPHINE SULFATE (PF) 4 MG/ML IV SOLN
4.0000 mg | Freq: Once | INTRAVENOUS | Status: AC
  Administered 2017-12-02: 4 mg via INTRAVENOUS

## 2017-12-02 MED ORDER — HYDROMORPHONE HCL 1 MG/ML IJ SOLN
0.5000 mg | Freq: Once | INTRAMUSCULAR | Status: AC
  Administered 2017-12-02: 0.5 mg via INTRAVENOUS
  Filled 2017-12-02: qty 1

## 2017-12-02 MED ORDER — PIPERACILLIN-TAZOBACTAM 3.375 G IVPB
3.3750 g | Freq: Three times a day (TID) | INTRAVENOUS | Status: DC
  Administered 2017-12-02 – 2017-12-08 (×19): 3.375 g via INTRAVENOUS
  Filled 2017-12-02 (×19): qty 50

## 2017-12-02 MED ORDER — ONDANSETRON HCL 4 MG/2ML IJ SOLN
4.0000 mg | Freq: Once | INTRAMUSCULAR | Status: AC
  Administered 2017-12-02: 4 mg via INTRAVENOUS
  Filled 2017-12-02: qty 2

## 2017-12-02 MED ORDER — ACETAMINOPHEN 325 MG PO TABS
650.0000 mg | ORAL_TABLET | Freq: Four times a day (QID) | ORAL | Status: DC | PRN
  Administered 2017-12-09 – 2017-12-13 (×9): 650 mg via ORAL
  Filled 2017-12-02 (×9): qty 2

## 2017-12-02 NOTE — ED Triage Notes (Signed)
Pt c/o abdominal pain that started yesterday; pt states he has not had a BM x 2 days; pt saw his PCP last Friday for diarrhea and was given medication to stop the diarrhea and pt states now he is unable to have a BM; pt states he feels nauseous but has not vomited

## 2017-12-02 NOTE — Consult Note (Signed)
[facility] Consult  Reason for Consult: Gallstone Pancreatitis  Referring Physician:  Dr. Carles Collet  Chief Complaint    Abdominal Pain      [name] is a 72 y.o. male.  HPI: [name] is a very pleasant 72 yo with a history of AVR with a bioprosthetic valve, A fib on coumadin who has been admitted from the ED with findings consistent with gallstone pancreatitis. He came into the hospital with upper abdominal pain and nausea, and underwent labs and a CT.  I ordered a [place] once I was consulted that demonstrated a CBD to 11 mm, and GI was also consulted and ultimately ordered his MRCP that demonstrates small stones in the distal CBD.   He currently is having some abdominal pain but is moved more generalized, and his pain is not as severe in the upper epigastric area.  He denies any chest pain or SOB since his MI and AVR but immediately during my conversation with them regarding possibility of procedures and surgery they requested transfer to [facility].   Past Medical History:  Diagnosis Date  . Aortic stenosis    a. s/p bioprosth AVR 04/2010;  b. 05/2012 Echo: EF 50-55%, nl bioprosth fxn w/ 58mHg gradient, mold dil LA.  .Marland KitchenAtrial fibrillation and flutter ([place]    a. s/p Cox-Maze @ time of AVR;  b. Previously on Amio and s/p DCCV x 2 ->failed->now rate controlled and on chronic coumadin.  .Marland KitchenCAD (coronary artery disease)    Occluded OM1 and LPDA in the setting of left atrial thrombus and cardioembolic event, otherwise nonobstructive disease 04/2010  . COPD (chronic obstructive pulmonary disease) ([place]   . Diverticulosis   . DJD (degenerative joint disease)    a. s/p R TKA 2010;  b. s/p L TKA 2013.  .Marland KitchenEssential hypertension   . Gout   . History of pneumonia   . Myocardial infarction ([place] 041/2878  Cardioembolic with occluded OM1 and left-sided PDA  . 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 Ventricular fibrillation ([place] 04/2010   VF arrest in setting of thrombotic MI  04/2010  . Wears glasses     Past Surgical History:  Procedure Laterality Date  . Aortic valve replacement with pericardial tissue valve, [place]  05/04/2010  . CARPAL TUNNEL RELEASE Right 10/18/2013   Procedure: RIGHT CARPAL TUNNEL RELEASE;  Surgeon: GWynonia Sours MD;  [place]  Service: Orthopedics;  Laterality: Right;  . CARPAL TUNNEL RELEASE Left 02/09/2014   Procedure: CARPAL TUNNEL RELEASE LEFT;  Surgeon: GWynonia Sours MD;  [place]  Service: Orthopedics;  Laterality: Left;  . CATARACT EXTRACTION W/PHACO Right 03/04/2016   Procedure: CATARACT EXTRACTION PHACO AND INTRAOCULAR LENS PLACEMENT (IOC);  Surgeon: KTonny Branch MD;  Location: [facility];  Service: Ophthalmology;  Laterality: Right;  CDE:5.43  . COLONOSCOPY  2005   Dr. RGala Romney internal hemorrhoids, pancolonic diverticula  . INGUINAL HERNIA REPAIR Right 08/25/2013   Procedure: HERNIA REPAIR INGUINAL ADULT;  Surgeon: DHarl Bowie MD;  [place]  Service: General;  Laterality: Right;  . INSERTION OF MESH Right 08/25/2013   Procedure: INSERTION OF MESH;  Surgeon: DHarl Bowie MD;  [place]  Service: General;  Laterality: Right;  . RETINAL LASER PROCEDURE     right eye   . Right ankle pinning     Fell from tree w/fracture several years ago.  . Right total knee replacement  04/02/2011  .  TOTAL KNEE ARTHROPLASTY  07/20/2012   Procedure: TOTAL KNEE ARTHROPLASTY;  Surgeon: Lorn Junes, MD;  Location: [place];  Service: Orthopedics;  Laterality: Left;  left total knee arthroplasty  . TRIGGER FINGER RELEASE Left 02/09/2014   Procedure: RELEASE A-1 PULLEY LEFT SMALL FINGER;  Surgeon: Wynonia Sours, MD;  Location: [place];  Service: Orthopedics;  Laterality: Left;  . ULNAR NERVE TRANSPOSITION Right 10/18/2013   Procedure: RIGHT ULNAR NERVE DECOMPRESSION/POSSIBLE TRANSPOSITION;  Surgeon: Wynonia Sours, MD;  Location: [place];  Service:  Orthopedics;  Laterality: Right;  . ULNAR NERVE TRANSPOSITION Left 02/09/2014   Procedure: DECOMPRESSION LEFT ULNAR NERVE ;  Surgeon: Wynonia Sours, MD;  Location: [place];  Service: Orthopedics;  Laterality: Left;    Family History  Problem Relation Age of Onset  . Hypertension Father   . Colon cancer Neg Hx     Social History   Tobacco Use  . Smoking status: Former Smoker    Types: Cigars    Last attempt to quit: 04/24/2010    Years since quitting: 7.6  . Smokeless tobacco: Never Used  Substance Use Topics  . Alcohol use: Yes    Comment: occasional, possibly every 2-3 days   . Drug use: No    Medications:  I have reviewed the patient's current medications. Prior to Admission:  Medications Prior to Admission  Medication Sig Dispense Refill Last Dose  . azithromycin (ZITHROMAX) 250 MG tablet Take 250 mg by mouth daily.     . benzonatate (TESSALON) 200 MG capsule Take 200 mg by mouth 2 (two) times daily as needed for cough.     Marland Kitchen lisinopril (PRINIVIL,ZESTRIL) 5 MG tablet TAKE ONE (1) TABLET EACH DAY 90 tablet 3   . metoprolol succinate (TOPROL-XL) 50 MG 24 hr tablet Take 1 tablet (50 mg total) by mouth 2 (two) times daily. Take with or immediately following a meal. 60 tablet 6   . warfarin (COUMADIN) 5 MG tablet Take 2 tablets daily except 1 tablet on Mondays and Fridays or as directed 60 tablet 4    Scheduled: . metoprolol tartrate  2.5 mg Intravenous Q6H   Continuous: . 0.9 % NaCl with KCl 20 mEq / L 75 mL/hr at 12/02/17 1007  . piperacillin-tazobactam (ZOSYN)  IV 3.375 g (12/02/17 1150)   KGU:RKYHCWCBJSEGB **OR** acetaminophen, HYDROmorphone (DILAUDID) injection, ondansetron **OR** ondansetron (ZOFRAN) IV  Allergies  Allergies  Allergen Reactions  . Diclofenac Itching  . Naproxen     itching     ROS:  A comprehensive review of systems was negative except for: Gastrointestinal: positive for abdominal pain, nausea and unable to have BM  today  Blood pressure 113/82, pulse 89, temperature 98.9 F (37.2 C), temperature source Oral, resp. rate 20, height _0  (1.778 m), weight 194 lb 0.1 oz (88 kg), SpO2 99 %. Physical Exam  Constitutional: He is oriented to person, place, and time and well-developed, well-nourished, and in no distress.  HENT:  Head: Normocephalic and atraumatic.  Eyes: Pupils are equal, round, and reactive to light.  Neck: Normal range of motion.  Cardiovascular: Normal rate.  Pulmonary/Chest: Effort normal.  Abdominal: Soft. He exhibits distension. There is generalized tenderness. There is no rebound and no guarding.  Musculoskeletal: Normal range of motion. He exhibits no edema.  Neurological: He is alert and oriented to person, place, and time.  Skin: Skin is warm and dry.  Psychiatric: Mood, memory, affect and judgment normal.  Results: Results for orders placed or performed during the hospital encounter of 12/02/17 (from the past 48 hour(s))  CBC with Differential/Platelet     Status: Abnormal   Collection Time: 12/02/17  5:17 AM  Result Value Ref Range   WBC 8.4 4.0 - 10.5 K/uL   RBC 3.85 (L) 4.22 - 5.81 MIL/uL   Hemoglobin 13.2 13.0 - 17.0 g/dL   HCT 39.0 39.0 - 52.0 %   MCV 101.3 (H) 78.0 - 100.0 fL   MCH 34.3 (H) 26.0 - 34.0 pg   MCHC 33.8 30.0 - 36.0 g/dL   RDW 12.6 11.5 - 15.5 %   Platelets 221 150 - 400 K/uL   Neutrophils Relative % 91 %   Neutro Abs 7.7 1.7 - 7.7 K/uL   Lymphocytes Relative 6 %   Lymphs Abs 0.5 (L) 0.7 - 4.0 K/uL   Monocytes Relative 2 %   Monocytes Absolute 0.2 0.1 - 1.0 K/uL   Eosinophils Relative 1 %   Eosinophils Absolute 0.0 0.0 - 0.7 K/uL   Basophils Relative 0 %   Basophils Absolute 0.0 0.0 - 0.1 K/uL  Comprehensive metabolic panel     Status: Abnormal   Collection Time: 12/02/17  5:17 AM  Result Value Ref Range   Sodium 135 135 - 145 mmol/L   Potassium 3.5 3.5 - 5.1 mmol/L   Chloride 101 101 - 111 mmol/L   CO2 21 (L) 22 - 32 mmol/L   Glucose,  Bld 161 (H) 65 - 99 mg/dL   BUN 20 6 - 20 mg/dL   Creatinine, Ser 1.09 0.61 - 1.24 mg/dL   Calcium 9.1 8.9 - 10.3 mg/dL   Total Protein 7.4 6.5 - 8.1 g/dL   Albumin 3.5 3.5 - 5.0 g/dL   AST 250 (H) 15 - 41 U/L   ALT 86 (H) 17 - 63 U/L   Alkaline Phosphatase 277 (H) 38 - 126 U/L   Total Bilirubin 1.3 (H) 0.3 - 1.2 mg/dL   GFR calc non Af Amer >60 >60 mL/min   GFR calc Af Amer >60 >60 mL/min    Comment: (NOTE) The eGFR has been calculated using the CKD EPI equation. This calculation has not been validated in all clinical situations. eGFR's persistently <60 mL/min signify possible Chronic Kidney Disease.    Anion gap 13 5 - 15  Protime-INR     Status: Abnormal   Collection Time: 12/02/17  5:17 AM  Result Value Ref Range   Prothrombin Time 23.7 (H) 11.4 - 15.2 seconds   INR 2.14   Lipase, blood     Status: Abnormal   Collection Time: 12/02/17  5:17 AM  Result Value Ref Range   Lipase 3,298 (H) 11 - 51 U/L    Comment: RESULTS CONFIRMED BY MANUAL DILUTION  I-Stat CG4 Lactic Acid, ED     Status: Abnormal   Collection Time: 12/02/17  5:31 AM  Result Value Ref Range   Lactic Acid, Venous 2.85 (HH) 0.5 - 1.9 mmol/L   Comment NOTIFIED PHYSICIAN   Lipid panel     Status: None   Collection Time: 12/02/17 10:00 AM  Result Value Ref Range   Cholesterol 131 0 - 200 mg/dL   Triglycerides 92 <150 mg/dL   HDL 62 >40 mg/dL   Total CHOL/HDL Ratio 2.1 RATIO   VLDL 18 0 - 40 mg/dL   LDL Cholesterol 51 0 - 99 mg/dL    Comment:        Total Cholesterol/HDL:CHD Risk Coronary  Heart Disease Risk Table                     Men   Women  1/2 Average Risk   3.4   3.3  Average Risk       5.0   4.4  2 X Average Risk   9.6   7.1  3 X Average Risk  23.4   11.0        Use the calculated Patient Ratio above and the CHD Risk Table to determine the patient's CHD Risk.        ATP III CLASSIFICATION (LDL):  <100     mg/dL   Optimal  100-129  mg/dL   Near or Above                    Optimal  130-159   mg/dL   Borderline  160-189  mg/dL   High  >190     mg/dL   Very High    Personally reviewed imaging Stranding and edema around pancreas, dilated CBD  Ct Abdomen Pelvis W Contrast  Result Date: 12/02/2017 CLINICAL DATA:  Altered bowel habits. Treatment for diarrhea has resulted in no bowel movement for 2 days. Increasing abdominal pain. EXAM: CT ABDOMEN AND PELVIS WITH CONTRAST TECHNIQUE: Multidetector CT imaging of the abdomen and pelvis was performed using the standard protocol following bolus administration of intravenous contrast. CONTRAST:  11m ISOVUE-300 IOPAMIDOL (ISOVUE-300) INJECTION 61% COMPARISON:  Multiple exams, including CT scan from 07/23/2012 FINDINGS: Lower chest: Moderate cardiomegaly. Aortic valve prosthesis. Posterior wall calcifications in the left atrium and to a lesser extent in the right atrium. Thinning of the left ventricular myocardium. Mild atelectasis in the lingula and mild dependent atelectasis in both lower lobes. Circumflex coronary artery atherosclerotic calcification. Hepatobiliary: Mild intrahepatic biliary dilatation. Common hepatic duct 1.6 cm, formerly 0.8 cm on 07/23/2012. Common bile duct 1.2 cm, formerly 0.8 cm. This CBD dilatation extends to the ampulla Dependent densities in the gallbladder are compatible with gallstones. Borderline wall thickening in portions of the gallbladder. Pancreas: Abnormal peripancreatic stranding diffusely without pseudocyst, abscess, or necrosis. Spleen: Unremarkable Adrenals/Urinary Tract: Stable fullness of the right adrenal gland. Left adrenal gland unremarkable. 0.7 by 1.4 cm hypodense lesion of the left kidney upper pole is likely a small cyst but technically too small to characterize. Stomach/Bowel: Mild wall thickening and accentuated enhancement in the duodenal bulb, descending duodenum, and proximal transverse duodenum noted, secondary inflammation is favored over peptic ulcer disease. Several small diverticula of the  transverse duodenum do not appear to be the epicenter of the regional inflammatory process. Descending and sigmoid colon diverticulosis without active diverticulitis. Scattered diverticula of the ascending colon noted. Appendix normal. Vascular/Lymphatic: Aortoiliac atherosclerotic vascular disease. Reproductive: Unremarkable Other: Small amount of free fluid adjacent to the descending duodenum and tracking in the right paracolic gutter. Small amount of central mesenteric edema. Musculoskeletal: Old healed right lower lateral rib fractures. Subacute healing fracture of the left anterior fifth and sixth ribs. Prior median sternotomy. Mildly exaggerated lumbar lordosis. Grade 1 degenerative anterolisthesis at L5-S1 with slightly transitional S1 level. Lumbar spondylosis and degenerative disc disease with mild impingement at L4-5 and L5-S1. IMPRESSION: 1. Acute pancreatitis with peripancreatic stranding and a small amount of ascites tracking in the right paracolic gutter. No findings of pancreatic abscess, necrosis, or pseudocyst. 2. Gallstones are present in the gallbladder and there is new intrahepatic and extrahepatic biliary dilatation, raising the strong possibility of occult distal CBD stone  as a potential cause for the pancreatitis and biliary dilatation. Minimal gallbladder wall thickening. 3. Subacute healing fractures of the left anterior fifth and sixth ribs. 4. Cardiomegaly with wall calcifications along the atria and thinning of the left ventricular myocardium. Coronary atherosclerosis. Scattered colonic diverticulosis diverticula of the transverse duodenum. There are also some. 5.  Aortic Atherosclerosis (ICD10-I70.0). 6. Mild lumbar impingement at L4-5 and L5-S1. Electronically Signed   By: Van Clines M.D.   On: 12/02/2017 07:48   Mr 3d Recon At Scanner  Result Date: 12/02/2017 CLINICAL DATA:  Upper abdominal pain. Abnormal liver function tests. Dilated common bile duct on ultrasound. Acute  pancreatitis on CT scan. EXAM: MRI ABDOMEN WITHOUT AND WITH CONTRAST (INCLUDING MRCP) TECHNIQUE: Multiplanar multisequence MR imaging of the abdomen was performed both before and after the administration of intravenous contrast. Heavily T2-weighted images of the biliary and pancreatic ducts were obtained, and three-dimensional MRCP images were rendered by post processing. CONTRAST:  72m MULTIHANCE GADOBENATE DIMEGLUMINE 529 MG/ML IV SOLN COMPARISON:  12/02/2017 CT scan FINDINGS: Despite efforts by the technologist and patient, motion artifact is present on today's exam and could not be eliminated. This is a common result when MRCP is attempted in the inpatient setting where patients are less likely to be able to breath hold and cooperate in controlling motion. This reduces exam sensitivity and specificity. Lower chest: Stable cardiomegaly. Aortic valve prosthesis. Mild atelectasis in the posterior basal segments of both lower lobes. Hepatobiliary: Cholelithiasis is observed. On images 45 through 50 of series 5, there is low signal intensity filling defect in the distal CBD compatible with small stones in the setting of choledocholithiasis. This is a likely cause for the biliary dilatation and pancreatitis. There appear to be multiple clustered calculi in the 5 mm in less range in the vicinity of the ampulla. The filling defects are also shown on image 23/4 and images 29 through 31 of series 24. Mild extrahepatic biliary dilatation, with the common bile duct at about 9 mm. Pancreas: Peripancreatic edema compatible with acute pancreatitis. No abscess, pseudocyst, or findings of pancreatic necrosis. Spleen:  Unremarkable Adrenals/Urinary Tract:  Unremarkable Stomach/Bowel: Mild secondary inflammation of the duodenum. Vascular/Lymphatic:  Aortoiliac atherosclerotic vascular disease. Other: Small amount of ascites along the inferior right hepatic margin. Musculoskeletal: Unremarkable IMPRESSION: 1. Choledocholithiasis  with several clustered small (less than 5 mm) stones in the distal CBD. These at the likely cause of the mild biliary dilatation and acute pancreatitis. 2. Peripancreatic edema compatible with acute pancreatitis. No findings of pancreatic abscess, pseudocyst, or pancreatic necrosis. 3. Stable cardiomegaly with aortic valve prosthesis. 4. Mild subsegmental atelectasis in the posterior basal segments of both lower lobes. 5. Gallstones in the gallbladder. 6.  Aortic Atherosclerosis (ICD10-I70.0). Electronically Signed   By: WVan ClinesM.D.   On: 12/02/2017 15:13   Dg Abdomen Acute W/chest  Result Date: 12/02/2017 CLINICAL DATA:  Abdominal pain since this morning. EXAM: DG ABDOMEN ACUTE W/ 1V CHEST COMPARISON:  Chest 02/05/2017 FINDINGS: Postoperative changes in the mediastinum. Shallow inspiration with atelectasis in the lung bases. Cardiac enlargement without vascular congestion or edema. No focal consolidation. No pneumothorax. Calcification of the aorta. Severe degenerative changes in the shoulders with resection or resorption of the distal left clavicle. Scattered gas and stool throughout the colon. No small or large bowel distention. No free intra-abdominal air. No abnormal air-fluid levels. Degenerative changes in the spine and hips. IMPRESSION: Shallow inspiration with atelectasis in the lung bases. Cardiac enlargement. Aortic atherosclerosis. Nonobstructive bowel gas  pattern with stool-filled colon. Electronically Signed   By: Lucienne Capers M.D.   On: 12/02/2017 06:17   Mr Abdomen Mrcp Moise Boring Contast  Result Date: 12/02/2017 CLINICAL DATA:  Upper abdominal pain. Abnormal liver function tests. Dilated common bile duct on ultrasound. Acute pancreatitis on CT scan. EXAM: MRI ABDOMEN WITHOUT AND WITH CONTRAST (INCLUDING MRCP) TECHNIQUE: Multiplanar multisequence MR imaging of the abdomen was performed both before and after the administration of intravenous contrast. Heavily T2-weighted images of  the biliary and pancreatic ducts were obtained, and three-dimensional MRCP images were rendered by post processing. CONTRAST:  85m MULTIHANCE GADOBENATE DIMEGLUMINE 529 MG/ML IV SOLN COMPARISON:  12/02/2017 CT scan FINDINGS: Despite efforts by the technologist and patient, motion artifact is present on today's exam and could not be eliminated. This is a common result when MRCP is attempted in the inpatient setting where patients are less likely to be able to breath hold and cooperate in controlling motion. This reduces exam sensitivity and specificity. Lower chest: Stable cardiomegaly. Aortic valve prosthesis. Mild atelectasis in the posterior basal segments of both lower lobes. Hepatobiliary: Cholelithiasis is observed. On images 45 through 50 of series 5, there is low signal intensity filling defect in the distal CBD compatible with small stones in the setting of choledocholithiasis. This is a likely cause for the biliary dilatation and pancreatitis. There appear to be multiple clustered calculi in the 5 mm in less range in the vicinity of the ampulla. The filling defects are also shown on image 23/4 and images 29 through 31 of series 24. Mild extrahepatic biliary dilatation, with the common bile duct at about 9 mm. Pancreas: Peripancreatic edema compatible with acute pancreatitis. No abscess, pseudocyst, or findings of pancreatic necrosis. Spleen:  Unremarkable Adrenals/Urinary Tract:  Unremarkable Stomach/Bowel: Mild secondary inflammation of the duodenum. Vascular/Lymphatic:  Aortoiliac atherosclerotic vascular disease. Other: Small amount of ascites along the inferior right hepatic margin. Musculoskeletal: Unremarkable IMPRESSION: 1. Choledocholithiasis with several clustered small (less than 5 mm) stones in the distal CBD. These at the likely cause of the mild biliary dilatation and acute pancreatitis. 2. Peripancreatic edema compatible with acute pancreatitis. No findings of pancreatic abscess, pseudocyst,  or pancreatic necrosis. 3. Stable cardiomegaly with aortic valve prosthesis. 4. Mild subsegmental atelectasis in the posterior basal segments of both lower lobes. 5. Gallstones in the gallbladder. 6.  Aortic Atherosclerosis (ICD10-I70.0). Electronically Signed   By: WVan ClinesM.D.   On: 12/02/2017 15:13   UKoreaAbdomen Limited Ruq  Result Date: 12/02/2017 CLINICAL DATA:  Acute cholelithiasis, pancreatitis. EXAM: ULTRASOUND ABDOMEN LIMITED RIGHT UPPER QUADRANT COMPARISON:  CT of the abdomen and pelvis from the same day. FINDINGS: Gallbladder: Sludge is present within the gallbladder. No visible stones are present. Wall is thickened, measuring up to 7 mm. There is no sonographic Murphy sign. Common bile duct: Diameter: The common bile duct is dilated measuring up to 11.6 mm. Liver: Liver is diffusely echogenic. No discrete lesions are present. Portal vein is patent on color Doppler imaging with normal direction of blood flow towards the liver. IMPRESSION: 1. Dilated common bile duct likely associated with the acute pancreatitis. 2. Gallbladder wall thickening and sludge may also be associated with acute pancreatitis. 3. No gallstones or sonographic Murphy sign to suggest primary gallbladder pathology. Electronically Signed   By: CSan MorelleM.D.   On: 12/02/2017 11:27     Assessment & Plan:  Tyler THOMLEYis a 72y.o. male with gallstone pancreatitis, choledocholithiasis on coumadin with an INR of  2.85 who is doing better clinically but will ultimately need an ERCP, cholecystectomy.  I have discussed with the patient and the wife, that we do these procedures here at [facility], and that before we did any thing that we would get cardiology involved to verify that he was an appropriate risk for doing the procedure at this location.    They have already decided that they want to be transferred to [facility] in [place] due to his cardiac history, and issues surrounding his AVR, etc.  I have told  them that this is not unreasonable, and that I will discuss this with Dr. Carles Collet.  We have discussed biliary pathology and gallstone pancreatitis, and we have discussed the spectrum of pancreatitis that exists, and that his pancreatitis could be continuing to blossom at this time.  I explained that we follow his exam and labs, and that he would ultimately need a cholecystectomy but that the decision of when to do this will depend on how he improves and the opinion of the surgeon examining him at that time.   For now, he has an INR for 2.85 and cannot undergo any operative intervention, and needs his ERCP prior to the surgery.   Would keep NPO with IVF if pain/tenderness continues If patient does remain here, would go ahead and get cardiology to see him to weigh in on coumadin and risk stratification.   All questions were answered to the satisfaction of the patient and family.  If he remains here, I will continue to see him.   Virl Cagey 12/02/2017, 3:37 PM

## 2017-12-02 NOTE — ED Notes (Signed)
Pt provided with mouth swab

## 2017-12-02 NOTE — H&P (Signed)
History and Physical  JAWAD WIACEK ZOX:[id number] DOB: [birth year] DOA: 12/02/2017   PCP: Celene Squibb, MD   Patient coming from: Home  Chief Complaint: vomiting, abdominal pain  HPI:  [name] is a 72 y.o. male with medical history of chronic atrial fibrillation s/p Maze procedure and failed DCCV, coronary artery disease, aortic stenosis status post bioprosthetic AVR 2011, hyperlipidemia, hypertension presenting with intermittent abdominal pain and loose stools for 1 week.  The patient also had some associated nonproductive cough and chest congestion. He saw his primary care provider on 11/28/2017.  Patient was given azithromycin and Tessalon Perles.  His diarrhea resolved, and he has not had a bowel movement in 3 days prior to this admission.  On the evening of 12/01/2017, the patient began having abdominal pain with associated nausea and vomiting.  As result, he presented for further evaluation.   In the emergency department, the patient was afebrile hemodynamically stable saturating 99% on 2 L.  BMP and CBC were unremarkable.  AST 250, ALT 86, alk phosphatase 277, total bilirubin 1.3, lipase 3290.  CT of the abdomen and pelvis showed cholelithiasis with minimal gallbladder wall thickening.  There was mild intrahepatic and extrahepatic ductal dilatation as well as common bile duct dilatation.  There was also peripancreatic stranding.  Thickening of the duodenal bulb and proximal duodenum likely secondary to pancreatic inflammation.    Assessment/Plan: Acute gallstone pancreatitis with choledocholithiasis -Consult GI -Consult general surgery -Remain n.p.o. -Continue IV fluids -Pain control -Check lipids -Holding warfarin  Acute cholecystitis with choledocholithiasis -Start Zosyn -Consult general surgery -Abd [place]  Chronic atrial fibrillation -Start IV Lopressor while the patient remains npo -Holding warfarin in anticipation for surgery -warfarin may be reversed with vitamin K  if needed--defer to GI -if warfarin is reversed, start IV heparin  Coronary artery disease -No chest pain presently -Obtain EKG  Essential hypertension -Controlled -Continue lisinopril -Continue metoprolol succinate  History bioprosthetic aortic valve replacement -Valve function was normal by echocardiogram last year September 2017        Past Medical History:  Diagnosis Date  . Aortic stenosis    a. s/p bioprosth AVR 04/2010;  b. 05/2012 Echo: EF 50-55%, nl bioprosth fxn w/ 54mHg gradient, mold dil LA.  .[facility]Atrial fibrillation and flutter ([facility]    a. s/p Cox-Maze @ time of AVR;  b. Previously on Amio and s/p DCCV x 2 ->failed->now rate controlled and on chronic coumadin.  .Marland KitchenCAD (coronary artery disease)    Occluded OM1 and LPDA in the setting of left atrial thrombus and cardioembolic event, otherwise nonobstructive disease 04/2010  . COPD (chronic obstructive pulmonary disease) ([facility]   . Diverticulosis   . DJD (degenerative joint disease)    a. s/p R TKA 2010;  b. s/p L TKA 2013.  .Marland KitchenEssential hypertension   . Gout   . History of pneumonia   . [facility]  Cardioembolic with occluded OM1 and left-sided PDA  . 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 Ventricular fibrillation ([facility] 04/2010   VF arrest in setting of thrombotic MI 04/2010  . Wears glasses    Past Surgical History:  Procedure Laterality Date  . Aortic valve replacement with pericardial tissue valve, [place]  05/04/2010  . CARPAL TUNNEL RELEASE Right 10/18/2013   Procedure: RIGHT CARPAL TUNNEL RELEASE;  Surgeon: GWynonia Sours MD;  [place]  Service: Orthopedics;  Laterality: Right;  . CARPAL TUNNEL RELEASE  Left 02/09/2014   Procedure: CARPAL TUNNEL RELEASE LEFT;  Surgeon: Wynonia Sours, MD;  Location: [place];  Service: Orthopedics;  Laterality: Left;  . CATARACT EXTRACTION W/PHACO Right 03/04/2016   Procedure: CATARACT EXTRACTION PHACO AND  INTRAOCULAR LENS PLACEMENT (IOC);  Surgeon: Tonny Branch, MD;  Location[facility];  Service: Ophthalmology;  Laterality: Right;  CDE:5.43  . COLONOSCOPY    . INGUINAL HERNIA REPAIR Right 08/25/2013   Procedure: HERNIA REPAIR INGUINAL ADULT;  Surgeon: Harl Bowie, MD;  Location: [place];  Service: General;  Laterality: Right;  . INSERTION OF MESH Right 08/25/2013   Procedure: INSERTION OF MESH;  Surgeon: Harl Bowie, MD;  Location: [place];  Service: General;  Laterality: Right;  . RETINAL LASER PROCEDURE     right eye   . Right ankle pinning     Fell from tree w/fracture several years ago.  . Right total knee replacement  04/02/2011  . TOTAL KNEE ARTHROPLASTY  07/20/2012   Procedure: TOTAL KNEE ARTHROPLASTY;  Surgeon: Lorn Junes, MD;  Location: [place];  Service: Orthopedics;  Laterality: Left;  left total knee arthroplasty  . TRIGGER FINGER RELEASE Left 02/09/2014   Procedure: RELEASE A-1 PULLEY LEFT SMALL FINGER;  Surgeon: Wynonia Sours, MD;  Location: [place];  Service: Orthopedics;  Laterality: Left;  . ULNAR NERVE TRANSPOSITION Right 10/18/2013   Procedure: RIGHT ULNAR NERVE DECOMPRESSION/POSSIBLE TRANSPOSITION;  Surgeon: Wynonia Sours, MD;  Location: [place];  Service: Orthopedics;  Laterality: Right;  . ULNAR NERVE TRANSPOSITION Left 02/09/2014   Procedure: DECOMPRESSION LEFT ULNAR NERVE ;  Surgeon: Wynonia Sours, MD;  Location: [place];  Service: Orthopedics;  Laterality: Left;   Social History:  reports that he quit smoking about 7 years ago. His smoking use included cigars. he has never used smokeless tobacco. He reports that he drinks alcohol. He reports that he does not use drugs.   Family History  Problem Relation Age of Onset  . Hypertension Father      Allergies  Allergen Reactions  . Diclofenac Itching  . Naproxen     itching     Prior to Admission medications   Medication Sig Start Date End Date Taking?  Authorizing Provider  azithromycin (ZITHROMAX) 250 MG tablet Take 250 mg by mouth daily.   Yes [provider]  benzonatate (TESSALON) 200 MG capsule Take 200 mg by mouth 2 (two) times daily as needed for cough.   Yes [provider]  lisinopril (PRINIVIL,ZESTRIL) 5 MG tablet TAKE ONE (1) TABLET EACH DAY 08/18/17  Yes Satira Sark, MD  metoprolol succinate (TOPROL-XL) 50 MG 24 hr tablet Take 1 tablet (50 mg total) by mouth 2 (two) times daily. Take with or immediately following a meal. 08/28/17  Yes Satira Sark, MD  warfarin (COUMADIN) 5 MG tablet Take 2 tablets daily except 1 tablet on Mondays and Fridays or as directed 09/08/17  Yes Satira Sark, MD  acetaminophen (TYLENOL) 500 MG tablet Take 1,000 mg by mouth daily as needed (arthritis pain). For knee pain.    [provider]  allopurinol (ZYLOPRIM) 300 MG tablet Take 300 mg by mouth daily.    [provider]  metoprolol succinate (TOPROL-XL) 50 MG 24 hr tablet TAKE ONE TABLET BY MOUTH TWICE A DAY TAKE WITH OR IMMEDIATELY FOLLOWING A MEAL 08/29/17   Satira Sark, MD    Review of Systems:  Constitutional:  No weight  loss, night sweats, Head&Eyes: No headache.  No vision loss.  No eye pain or scotoma ENT:  No Difficulty swallowing,Tooth/dental problems,Sore throat,  No ear ache, post nasal drip,  Cardio-vascular:  No chest pain, Orthopnea, PND, swelling in lower extremities,  dizziness, palpitations  GI:  No   loss of appetite, hematochezia, melena, heartburn, indigestion, Resp:  No shortness of breath with exertion or at rest. No cough. No coughing up of blood .No wheezing.No chest wall deformity  Skin:  no rash or lesions.  GU:  no dysuria, change in color of urine, no urgency or frequency. No flank pain.  Musculoskeletal:  No joint pain or swelling. No decreased range of motion. No back pain.  Psych:  No change in mood or affect. No depression or anxiety. Neurologic: No  headache, no dysesthesia, no focal weakness, no vision loss. No syncope  Physical Exam: Vitals:   12/02/17 0700 12/02/17 0730 12/02/17 0755 12/02/17 0800  BP: (!) 105/48 127/70 127/70 113/76  Pulse: 100 97 76 (!) 102  Resp:   18   Temp:      TempSrc:      SpO2: 96% 91% (!) 89% 92%  Weight:      Height:       General:  A&O x 3, NAD, nontoxic, pleasant/cooperative Head/Eye: No conjunctival hemorrhage, no icterus, [place]/AT, No nystagmus ENT:  No icterus,  No thrush, good dentition, no pharyngeal exudate Neck:  No masses, no lymphadenpathy, no bruits CV:  IRRR, no rub, no gallop, no S3 Lung:  CTAB, good air movement, no wheeze, no rhonchi Abdomen: soft/epigastric pain, +BS, nondistended, no peritoneal signs Ext: No cyanosis, No rashes, No petechiae, No lymphangitis, No edema Neuro: CNII-XII intact, strength 4/5 in bilateral upper and lower extremities, no dysmetria  Labs on Admission:  Basic Metabolic Panel: Recent Labs  Lab 12/02/17 0517  NA 135  K 3.5  CL 101  CO2 21*  GLUCOSE 161*  BUN 20  CREATININE 1.09  CALCIUM 9.1   Liver Function Tests: Recent Labs  Lab 12/02/17 0517  AST 250*  ALT 86*  ALKPHOS 277*  BILITOT 1.3*  PROT 7.4  ALBUMIN 3.5   Recent Labs  Lab 12/02/17 0517  LIPASE 3,298*   No results for input(s): AMMONIA in the last 168 hours. CBC: Recent Labs  Lab 12/02/17 0517  WBC 8.4  NEUTROABS 7.7  HGB 13.2  HCT 39.0  MCV 101.3*  PLT 221   Coagulation Profile: Recent Labs  Lab 11/26/17 0859 12/02/17 0517  INR 3.6 2.14   Cardiac Enzymes: No results for input(s): CKTOTAL, CKMB, CKMBINDEX, TROPONINI in the last 168 hours. BNP: Invalid input(s): POCBNP CBG: No results for input(s): GLUCAP in the last 168 hours. Urine analysis:    Component Value Date/Time   COLORURINE YELLOW 07/14/2012 [place] 07/14/2012 1243   LABSPEC 1.009 07/14/2012 1243   PHURINE 6.5 07/14/2012 1243   GLUCOSEU NEGATIVE 07/14/2012 1243   HGBUR  NEGATIVE 07/14/2012 1243   BILIRUBINUR NEGATIVE 07/14/2012 1243   KETONESUR NEGATIVE 07/14/2012 1243   PROTEINUR NEGATIVE 07/14/2012 1243   UROBILINOGEN 1.0 07/14/2012 1243   NITRITE NEGATIVE 07/14/2012 1243   LEUKOCYTESUR NEGATIVE 07/14/2012 1243   Sepsis Labs: _0 (procalcitonin:4,lacticidven:4) )No results found for this or any previous visit (from the past 240 hour(s)).   Radiological Exams on Admission: Ct Abdomen Pelvis W Contrast  Result Date: 12/02/2017 CLINICAL DATA:  Altered bowel habits. Treatment for diarrhea has resulted in no bowel movement for 2 days. Increasing  abdominal pain. EXAM: CT ABDOMEN AND PELVIS WITH CONTRAST TECHNIQUE: Multidetector CT imaging of the abdomen and pelvis was performed using the standard protocol following bolus administration of intravenous contrast. CONTRAST:  144m ISOVUE-300 IOPAMIDOL (ISOVUE-300) INJECTION 61% COMPARISON:  Multiple exams, including CT scan from 07/23/2012 FINDINGS: Lower chest: Moderate cardiomegaly. Aortic valve prosthesis. Posterior wall calcifications in the left atrium and to a lesser extent in the right atrium. Thinning of the left ventricular myocardium. Mild atelectasis in the lingula and mild dependent atelectasis in both lower lobes. Circumflex coronary artery atherosclerotic calcification. Hepatobiliary: Mild intrahepatic biliary dilatation. Common hepatic duct 1.6 cm, formerly 0.8 cm on 07/23/2012. Common bile duct 1.2 cm, formerly 0.8 cm. This CBD dilatation extends to the ampulla Dependent densities in the gallbladder are compatible with gallstones. Borderline wall thickening in portions of the gallbladder. Pancreas: Abnormal peripancreatic stranding diffusely without pseudocyst, abscess, or necrosis. Spleen: Unremarkable Adrenals/Urinary Tract: Stable fullness of the right adrenal gland. Left adrenal gland unremarkable. 0.7 by 1.4 cm hypodense lesion of the left kidney upper pole is likely a small cyst but technically  too small to characterize. Stomach/Bowel: Mild wall thickening and accentuated enhancement in the duodenal bulb, descending duodenum, and proximal transverse duodenum noted, secondary inflammation is favored over peptic ulcer disease. Several small diverticula of the transverse duodenum do not appear to be the epicenter of the regional inflammatory process. Descending and sigmoid colon diverticulosis without active diverticulitis. Scattered diverticula of the ascending colon noted. Appendix normal. Vascular/Lymphatic: Aortoiliac atherosclerotic vascular disease. Reproductive: Unremarkable Other: Small amount of free fluid adjacent to the descending duodenum and tracking in the right paracolic gutter. Small amount of central mesenteric edema. Musculoskeletal: Old healed right lower lateral rib fractures. Subacute healing fracture of the left anterior fifth and sixth ribs. Prior median sternotomy. Mildly exaggerated lumbar lordosis. Grade 1 degenerative anterolisthesis at L5-S1 with slightly transitional S1 level. Lumbar spondylosis and degenerative disc disease with mild impingement at L4-5 and L5-S1. IMPRESSION: 1. Acute pancreatitis with peripancreatic stranding and a small amount of ascites tracking in the right paracolic gutter. No findings of pancreatic abscess, necrosis, or pseudocyst. 2. Gallstones are present in the gallbladder and there is new intrahepatic and extrahepatic biliary dilatation, raising the strong possibility of occult distal CBD stone as a potential cause for the pancreatitis and biliary dilatation. Minimal gallbladder wall thickening. 3. Subacute healing fractures of the left anterior fifth and sixth ribs. 4. Cardiomegaly with wall calcifications along the atria and thinning of the left ventricular myocardium. Coronary atherosclerosis. Scattered colonic diverticulosis diverticula of the transverse duodenum. There are also some. 5.  Aortic Atherosclerosis (ICD10-I70.0). 6. Mild lumbar  impingement at L4-5 and L5-S1. Electronically Signed   By: WVan ClinesM.D.   On: 12/02/2017 07:48   Dg Abdomen Acute W/chest  Result Date: 12/02/2017 CLINICAL DATA:  Abdominal pain since this morning. EXAM: DG ABDOMEN ACUTE W/ 1V CHEST COMPARISON:  Chest 02/05/2017 FINDINGS: Postoperative changes in the mediastinum. Shallow inspiration with atelectasis in the lung bases. Cardiac enlargement without vascular congestion or edema. No focal consolidation. No pneumothorax. Calcification of the aorta. Severe degenerative changes in the shoulders with resection or resorption of the distal left clavicle. Scattered gas and stool throughout the colon. No small or large bowel distention. No free intra-abdominal air. No abnormal air-fluid levels. Degenerative changes in the spine and hips. IMPRESSION: Shallow inspiration with atelectasis in the lung bases. Cardiac enlargement. Aortic atherosclerosis. Nonobstructive bowel gas pattern with stool-filled colon. Electronically Signed   By: WOren BeckmannD.  On: 12/02/2017 06:17    EKG: Independently reviewed. pending    Time spent:60 minutes Code Status:   FULL Family Communication:  No Family at bedside Disposition Plan: expect 2-3 day hospitalization Consults called: GI, general surgery DVT Prophylaxis: coumadin  Orson Eva, DO  [facility] Pager [phone]  If 7PM-7AM, please contact night-coverage [email or web address] Password [facility] 12/02/2017, 8:35 AM

## 2017-12-02 NOTE — Consult Note (Signed)
Referring Provider: Dr. Carles Collet  Primary Care Physician:  Celene Squibb, MD Primary Gastroenterologist:  Dr. Oneida Alar   Date of Admission: 12/02/17 Date of Consultation: 12/02/17  Reason for Consultation:  Gallstone pancreatitis   HPI:  [name] is a 72 y.o. year old male presenting to [facility] ED with several hour history of epigastric pain worsening in intensity, accompanied nausea, afebrile, and found to have acute pancreatitis, cholelithiasis with intrahepatic and extrahepatic biliary dilation, CBD 1.2 cm (formely 0.8 cm in 2013). Lipase >3000, bilirubin 1.3, AST 250, ALT 86, Alk Phos 277. No leukocytosis. INR 2.14, on Coumadin chronically for afib.    Noted intermittent mild epigastric pain over the past few weeks. However, this morning at 2am woke up with  severe epigastric pain. Had a large amount of nausea and heaving, no vomiting. No fever or chills. Last dose of Coumadin yesterday evening. Has had chest congestion and coughing, and he has been on azithromycin and tessalon from PCP. Abdominal pain improved at time of consultation. No nausea. Wife at bedside. Family requesting any intervention (ERCP, surgery) be performed at [facility] due to prior cardiac history.   Past Medical History:  Diagnosis Date  . Aortic stenosis    a. s/p bioprosth AVR 04/2010;  b. 05/2012 Echo: EF 50-55%, nl bioprosth fxn w/ 33mHg gradient, mold dil LA.  .Marland KitchenAtrial fibrillation and flutter ([place]    a. s/p Cox-Maze @ time of AVR;  b. Previously on Amio and s/p DCCV x 2 ->failed->now rate controlled and on chronic coumadin.  .Marland KitchenCAD (coronary artery disease)    Occluded OM1 and LPDA in the setting of left atrial thrombus and cardioembolic event, otherwise nonobstructive disease 04/2010  . COPD (chronic obstructive pulmonary disease) ([place]   . Diverticulosis   . DJD (degenerative joint disease)    a. s/p R TKA 2010;  b. s/p L TKA 2013.  .Marland KitchenEssential hypertension   . Gout   . History of pneumonia   . Myocardial  infarction ([place] 035/4656  Cardioembolic with occluded OM1 and left-sided PDA  . 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 Ventricular fibrillation ([place] 04/2010   VF arrest in setting of thrombotic MI 04/2010  . Wears glasses     Past Surgical History:  Procedure Laterality Date  . Aortic valve replacement with pericardial tissue valve, [place]  05/04/2010  . CARPAL TUNNEL RELEASE Right 10/18/2013   Procedure: RIGHT CARPAL TUNNEL RELEASE;  Surgeon: GWynonia Sours MD;  [place]  Service: Orthopedics;  Laterality: Right;  . CARPAL TUNNEL RELEASE Left 02/09/2014   Procedure: CARPAL TUNNEL RELEASE LEFT;  Surgeon: GWynonia Sours MD;  [place]  Service: Orthopedics;  Laterality: Left;  . CATARACT EXTRACTION W/PHACO Right 03/04/2016   Procedure: CATARACT EXTRACTION PHACO AND INTRAOCULAR LENS PLACEMENT (IOC);  Surgeon: KTonny Branch MD;  Location: [facility];  Service: Ophthalmology;  Laterality: Right;  CDE:5.43  . COLONOSCOPY  2005   Dr. RGala Romney internal hemorrhoids, pancolonic diverticula  . INGUINAL HERNIA REPAIR Right 08/25/2013   Procedure: HERNIA REPAIR INGUINAL ADULT;  Surgeon: DHarl Bowie MD;  [place]  Service: General;  Laterality: Right;  . INSERTION OF MESH Right 08/25/2013   Procedure: INSERTION OF MESH;  Surgeon: DHarl Bowie MD;  [place]  Service: General;  Laterality: Right;  . RETINAL LASER PROCEDURE     right eye   . Right ankle pinning     [place]  from [place] w/fracture several years ago.  . Right total knee replacement  04/02/2011  . TOTAL KNEE ARTHROPLASTY  07/20/2012   Procedure: TOTAL KNEE ARTHROPLASTY;  Surgeon: Lorn Junes, MD;  Location: [place];  Service: Orthopedics;  Laterality: Left;  left total knee arthroplasty  . TRIGGER FINGER RELEASE Left 02/09/2014   Procedure: RELEASE A-1 PULLEY LEFT SMALL FINGER;  Surgeon: Wynonia Sours, MD;  Location: [place];  Service: Orthopedics;   Laterality: Left;  . ULNAR NERVE TRANSPOSITION Right 10/18/2013   Procedure: RIGHT ULNAR NERVE DECOMPRESSION/POSSIBLE TRANSPOSITION;  Surgeon: Wynonia Sours, MD;  Location: [place];  Service: Orthopedics;  Laterality: Right;  . ULNAR NERVE TRANSPOSITION Left 02/09/2014   Procedure: DECOMPRESSION LEFT ULNAR NERVE ;  Surgeon: Wynonia Sours, MD;  Location: [place];  Service: Orthopedics;  Laterality: Left;    Prior to Admission medications   Medication Sig Start Date End Date Taking? Authorizing Provider  azithromycin (ZITHROMAX) 250 MG tablet Take 250 mg by mouth daily.   Yes [provider]  benzonatate (TESSALON) 200 MG capsule Take 200 mg by mouth 2 (two) times daily as needed for cough.   Yes [provider]  lisinopril (PRINIVIL,ZESTRIL) 5 MG tablet TAKE ONE (1) TABLET EACH DAY 08/18/17  Yes Satira Sark, MD  metoprolol succinate (TOPROL-XL) 50 MG 24 hr tablet Take 1 tablet (50 mg total) by mouth 2 (two) times daily. Take with or immediately following a meal. 08/28/17  Yes Satira Sark, MD  warfarin (COUMADIN) 5 MG tablet Take 2 tablets daily except 1 tablet on Mondays and Fridays or as directed 09/08/17  Yes Satira Sark, MD    Current Facility-Administered Medications  Medication Dose Route Frequency Provider Last Rate Last Dose  . 0.9 % NaCl with KCl 20 mEq/ L  infusion   Intravenous Continuous Tat, Shanon Brow, MD 75 mL/hr at 12/02/17 1007    . acetaminophen (TYLENOL) tablet 650 mg  650 mg Oral Q6H PRN Tat, Shanon Brow, MD       Or  . acetaminophen (TYLENOL) suppository 650 mg  650 mg Rectal Q6H PRN Tat, Shanon Brow, MD      . metoprolol tartrate (LOPRESSOR) injection 2.5 mg  2.5 mg Intravenous Q6H Tat, David, MD      . ondansetron (ZOFRAN) tablet 4 mg  4 mg Oral Q6H PRN Tat, David, MD       Or  . ondansetron (ZOFRAN) injection 4 mg  4 mg Intravenous Q6H PRN Tat, David, MD      . piperacillin-tazobactam (ZOSYN) IVPB 3.375 g  3.375 g  Intravenous Franco Collet, MD        Allergies as of 12/02/2017 - Review Complete 12/02/2017  Allergen Reaction Noted  . Diclofenac Itching 08/09/2011  . Naproxen  02/07/2011    Family History  Problem Relation Age of Onset  . Hypertension Father   . Colon cancer Neg Hx     Social History   Socioeconomic History  . Marital status: Married    Spouse name: Not on file  . Number of children: Not on file  . Years of education: Not on file  . Highest education level: Not on file  Social Needs  . Financial resource strain: Not on file  . Food insecurity - worry: Not on file  . Food insecurity - inability: Not on file  . Transportation needs - medical: Not on file  . Transportation needs - non-medical: Not on file  Occupational History  . Occupation: retired  Tobacco Use  . Smoking status: Former Smoker    Types: Cigars    Last attempt to quit: 04/24/2010    Years since quitting: 7.6  . Smokeless tobacco: Never Used  Substance and Sexual Activity  . Alcohol use: Yes    Comment: occasional, possibly every 2-3 days   . Drug use: No  . Sexual activity: Not Currently  Other Topics Concern  . Not on file  Social History Narrative  . Not on file    Review of Systems: Gen: see HPI  CV: Denies chest pain, heart palpitations, syncope, edema  Resp: see HPI  GI: see HPI  GU : Denies urinary burning, urinary frequency, urinary incontinence.  MS: Denies joint pain,swelling, cramping Derm: Denies rash, itching, dry skin Psych: Denies depression, anxiety,confusion, or memory loss Heme: Denies bruising, bleeding, and enlarged lymph nodes.  Physical Exam: Vital signs in last 24 hours: Temp:  [97.4 F (36.3 C)-99 F (37.2 C)] 99 F (37.2 C) (01/22 0923) Pulse Rate:  [76-105] 100 (01/22 0923) Resp:  [18] 18 (01/22 0923) BP: (105-155)/(48-91) 109/64 (01/22 0923) SpO2:  [89 %-99 %] 99 % (01/22 0923) Weight:  [194 lb (88 kg)-194 lb 0.1 oz (88 kg)] 194 lb 0.1 oz (88 kg) (01/22  0923) Last BM Date: 12/01/17 General:   Alert,  Well-developed, well-nourished, drowsy but cooperative, no distress Head:  Normocephalic and atraumatic. Eyes:  Sclera clear, no icterus.    Ears:  Normal auditory acuity. Mouth:  No deformity or lesions Lungs:  Clear throughout to auscultation.  Heart:  S1 S2 present, irregularly irregular, soft systolic murmur Abdomen:  +BS, distended but soft, non-tender with deep palpation, no rebound or guarding Rectal:  Deferred  Msk:  Symmetrical without gross deformities.  Extremities:  Without edema. Neurologic:  Oriented X 4. Drowsy but easily awakens  Psych:  Normal mood and affect.  Intake/Output from previous day: 01/21 0701 - 01/22 0700 In: 1000 [IV Piggyback:1000] Out: -  Intake/Output this shift: Total I/O In: 1000 [IV Piggyback:1000] Out: -   Lab Results: Recent Labs    12/02/17 0517  WBC 8.4  HGB 13.2  HCT 39.0  PLT 221   BMET Recent Labs    12/02/17 0517  NA 135  K 3.5  CL 101  CO2 21*  GLUCOSE 161*  BUN 20  CREATININE 1.09  CALCIUM 9.1   LFT Recent Labs    12/02/17 0517  PROT 7.4  ALBUMIN 3.5  AST 250*  ALT 86*  ALKPHOS 277*  BILITOT 1.3*   PT/INR Recent Labs    12/02/17 0517  LABPROT 23.7*  INR 2.14   Lab Results  Component Value Date   LIPASE 3,298 (H) 12/02/2017     Studies/Results: Ct Abdomen Pelvis W Contrast  Result Date: 12/02/2017 CLINICAL DATA:  Altered bowel habits. Treatment for diarrhea has resulted in no bowel movement for 2 days. Increasing abdominal pain. EXAM: CT ABDOMEN AND PELVIS WITH CONTRAST TECHNIQUE: Multidetector CT imaging of the abdomen and pelvis was performed using the standard protocol following bolus administration of intravenous contrast. CONTRAST:  157m ISOVUE-300 IOPAMIDOL (ISOVUE-300) INJECTION 61% COMPARISON:  Multiple exams, including CT scan from 07/23/2012 FINDINGS: Lower chest: Moderate cardiomegaly. Aortic valve prosthesis. Posterior wall calcifications  in the left atrium and to a lesser extent in the right atrium. Thinning of the left ventricular myocardium. Mild atelectasis in the lingula and mild dependent atelectasis in both lower lobes. Circumflex coronary artery atherosclerotic calcification. Hepatobiliary:  Mild intrahepatic biliary dilatation. Common hepatic duct 1.6 cm, formerly 0.8 cm on 07/23/2012. Common bile duct 1.2 cm, formerly 0.8 cm. This CBD dilatation extends to the ampulla Dependent densities in the gallbladder are compatible with gallstones. Borderline wall thickening in portions of the gallbladder. Pancreas: Abnormal peripancreatic stranding diffusely without pseudocyst, abscess, or necrosis. Spleen: Unremarkable Adrenals/Urinary Tract: Stable fullness of the right adrenal gland. Left adrenal gland unremarkable. 0.7 by 1.4 cm hypodense lesion of the left kidney upper pole is likely a small cyst but technically too small to characterize. Stomach/Bowel: Mild wall thickening and accentuated enhancement in the duodenal bulb, descending duodenum, and proximal transverse duodenum noted, secondary inflammation is favored over peptic ulcer disease. Several small diverticula of the transverse duodenum do not appear to be the epicenter of the regional inflammatory process. Descending and sigmoid colon diverticulosis without active diverticulitis. Scattered diverticula of the ascending colon noted. Appendix normal. Vascular/Lymphatic: Aortoiliac atherosclerotic vascular disease. Reproductive: Unremarkable Other: Small amount of free fluid adjacent to the descending duodenum and tracking in the right paracolic gutter. Small amount of central mesenteric edema. Musculoskeletal: Old healed right lower lateral rib fractures. Subacute healing fracture of the left anterior fifth and sixth ribs. Prior median sternotomy. Mildly exaggerated lumbar lordosis. Grade 1 degenerative anterolisthesis at L5-S1 with slightly transitional S1 level. Lumbar spondylosis and  degenerative disc disease with mild impingement at L4-5 and L5-S1. IMPRESSION: 1. Acute pancreatitis with peripancreatic stranding and a small amount of ascites tracking in the right paracolic gutter. No findings of pancreatic abscess, necrosis, or pseudocyst. 2. Gallstones are present in the gallbladder and there is new intrahepatic and extrahepatic biliary dilatation, raising the strong possibility of occult distal CBD stone as a potential cause for the pancreatitis and biliary dilatation. Minimal gallbladder wall thickening. 3. Subacute healing fractures of the left anterior fifth and sixth ribs. 4. Cardiomegaly with wall calcifications along the atria and thinning of the left ventricular myocardium. Coronary atherosclerosis. Scattered colonic diverticulosis diverticula of the transverse duodenum. There are also some. 5.  Aortic Atherosclerosis (ICD10-I70.0). 6. Mild lumbar impingement at L4-5 and L5-S1. Electronically Signed   By: Van Clines M.D.   On: 12/02/2017 07:48   Dg Abdomen Acute W/chest  Result Date: 12/02/2017 CLINICAL DATA:  Abdominal pain since this morning. EXAM: DG ABDOMEN ACUTE W/ 1V CHEST COMPARISON:  Chest 02/05/2017 FINDINGS: Postoperative changes in the mediastinum. Shallow inspiration with atelectasis in the lung bases. Cardiac enlargement without vascular congestion or edema. No focal consolidation. No pneumothorax. Calcification of the aorta. Severe degenerative changes in the shoulders with resection or resorption of the distal left clavicle. Scattered gas and stool throughout the colon. No small or large bowel distention. No free intra-abdominal air. No abnormal air-fluid levels. Degenerative changes in the spine and hips. IMPRESSION: Shallow inspiration with atelectasis in the lung bases. Cardiac enlargement. Aortic atherosclerosis. Nonobstructive bowel gas pattern with stool-filled colon. Electronically Signed   By: Lucienne Capers M.D.   On: 12/02/2017 06:17     Impression: 72 year old male presenting with several week history of epigastric pain and notably severe pain this morning prompting admission for acute biliary pancreatitis. Evidence of biliary dilation on CT. History significant for aortic valve replacement with bioprosthetic valve (model # [id number], serial # [id number]). Discussed with MRI, and MRCP can be performed. Patient is also on long-term anticoagulation with history of afib; last dose of Coumadin 1/21 afternoon. US abdomen already in process currently. Will await findings and pursue MRCP if inconclusive. Coumadin is on hold. As of  note, if ERCP is indicated, family desires this to be completed at [facility].   In interim, continue gentle IV fluid resuscitation as already ordered: not a candidate for aggressive IV hydration with comorbidities. Will await pending ultrasound.   Plan: NPO IVFs at 75 ml/hour as already ordered Await pending ultrasound currently, may need MRCP: if so, it is not contraindicated and this has been reviewed with MRI If ERCP needed, family desires at [facility] Continue to hold Coumadin   Annitta Needs, PhD, ANP-BC [facility]      LOS: 0 days    12/02/2017, 10:14 AM  Addendum at 1/22, 1115: Pursuing MRCP now, orders placed.

## 2017-12-02 NOTE — ED Provider Notes (Signed)
[facility] EMERGENCY DEPARTMENT Provider Note   CSN: [id number] Arrival date & time: 12/02/17  0447     History   Chief Complaint Chief Complaint  Patient presents with  . Abdominal Pain    HPI [name] is a 72 y.o. male.  Patient presents with upper abdominal pain with dry heaving and nausea.  States he had diarrhea for the past week after being on an antibiotic for a chest congestion syndrome.  The diarrhea stopped and he has not had a bowel movement for the past 2 days.  He is passing gas without a problem.  States he is unable to have a bowel movement.  Has nausea and dry heaving but no vomiting.  Complains of upper abdominal pain.  No fever.  No chest pain or shortness of breath.  He is on Coumadin for atrial fibrillation and has a prosthetic valve.  Denies any abdominal surgeries but chart review shows history of inguinal hernia repair.  No urinary symptoms.  No testicular pain.   The history is provided by the patient and the spouse.    Past Medical History:  Diagnosis Date  . Aortic stenosis    a. s/p bioprosth AVR 04/2010;  b. 05/2012 Echo: EF 50-55%, nl bioprosth fxn w/ gradient, mold dil LA.  Marland Kitchen Atrial fibrillation and flutter (HCC)    a. s/p Cox-Maze @ time of AVR;  b. Previously on Amio and s/p DCCV x 2 ->failed->now rate controlled and on chronic coumadin.  Marland Kitchen CAD (coronary artery disease)    Occluded OM1 and LPDA in the setting of left atrial thrombus and cardioembolic event, otherwise nonobstructive disease 04/2010  . COPD (chronic obstructive pulmonary disease) (HCC)   . Diverticulosis   . DJD (degenerative joint disease)    a. s/p R TKA 2010;  b. s/p L TKA 2013.  Marland Kitchen Essential hypertension   . Gout   . History of pneumonia   . Myocardial infarction (HCC) 04/2010   Cardioembolic with occluded OM1 and left-sided PDA  . 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 Ventricular fibrillation (HCC) 04/2010   VF arrest in setting of thrombotic MI 04/2010  .  Wears glasses     Patient Active Problem List   Diagnosis Date Noted  . Encounter for therapeutic drug monitoring 12/08/2013  . Right inguinal hernia 08/10/2013  . Ileus, postoperative (HCC) 07/23/2012  . Upper respiratory infection 07/22/2012  . Left knee DJD   . Long term (current) use of anticoagulants 03/11/2011  . Osteoarthritis 02/07/2011  . CAD, NATIVE VESSEL 05/24/2010  . Aortic valve disorder 05/24/2010  . Atrial fibrillation (HCC) 05/24/2010  . TACHYCARDIA 05/24/2010    Past Surgical History:  Procedure Laterality Date  . Aortic valve replacement with pericardial tissue valve, [place]  05/04/2010  . CARPAL TUNNEL RELEASE Right 10/18/2013   Procedure: RIGHT CARPAL TUNNEL RELEASE;  Surgeon: Nicki Reaper, MD;  Location: [facility];  Service: Orthopedics;  Laterality: Right;  . CARPAL TUNNEL RELEASE Left 02/09/2014   Procedure: CARPAL TUNNEL RELEASE LEFT;  Surgeon: Nicki Reaper, MD;  Location: [facility];  Service: Orthopedics;  Laterality: Left;  . CATARACT EXTRACTION W/PHACO Right 03/04/2016   Procedure: CATARACT EXTRACTION PHACO AND INTRAOCULAR LENS PLACEMENT (IOC);  Surgeon: Gemma Payor, MD;  Location: [facility];  Service: Ophthalmology;  Laterality: Right;  CDE:5.43  . COLONOSCOPY    . INGUINAL HERNIA REPAIR Right 08/25/2013   Procedure: HERNIA REPAIR INGUINAL ADULT;  Surgeon: Christell Constant  Magnus IvanBlackman, MD;  Location: [facility];  Service: General;  Laterality: Right;  . INSERTION OF MESH Right 08/25/2013   Procedure: INSERTION OF MESH;  Surgeon: Shelly Rubensteinouglas A Blackman, MD;  Location: [facility];  Service: General;  Laterality: Right;  . RETINAL LASER PROCEDURE     right eye   . Right ankle pinning     Fell from tree w/fracture several years ago.  . Right total knee replacement  04/02/2011  . TOTAL KNEE ARTHROPLASTY  07/20/2012   Procedure: TOTAL KNEE ARTHROPLASTY;  Surgeon: Nilda Simmerobert A Wainer, MD;  Location: [facility];  Service: Orthopedics;  Laterality: Left;  left total  knee arthroplasty  . TRIGGER FINGER RELEASE Left 02/09/2014   Procedure: RELEASE A-1 PULLEY LEFT SMALL FINGER;  Surgeon: Nicki ReaperGary R Kuzma, MD;  Location: [facility];  Service: Orthopedics;  Laterality: Left;  . ULNAR NERVE TRANSPOSITION Right 10/18/2013   Procedure: RIGHT ULNAR NERVE DECOMPRESSION/POSSIBLE TRANSPOSITION;  Surgeon: Nicki ReaperGary R Kuzma, MD;  Location: [facility];  Service: Orthopedics;  Laterality: Right;  . ULNAR NERVE TRANSPOSITION Left 02/09/2014   Procedure: DECOMPRESSION LEFT ULNAR NERVE ;  Surgeon: Nicki ReaperGary R Kuzma, MD;  Location: [facility];  Service: Orthopedics;  Laterality: Left;       Home Medications    Prior to Admission medications   Medication Sig Start Date End Date Taking? Authorizing Provider  azithromycin (ZITHROMAX) 250 MG tablet Take 250 mg by mouth daily.   Yes [provider]  benzonatate (TESSALON) 200 MG capsule Take 200 mg by mouth 2 (two) times daily as needed for cough.   Yes [provider]  lisinopril (PRINIVIL,ZESTRIL) 5 MG tablet TAKE ONE (1) TABLET EACH DAY 08/18/17  Yes Jonelle SidleMcDowell, Samuel G, MD  metoprolol succinate (TOPROL-XL) 50 MG 24 hr tablet Take 1 tablet (50 mg total) by mouth 2 (two) times daily. Take with or immediately following a meal. 08/28/17  Yes Jonelle SidleMcDowell, Samuel G, MD  warfarin (COUMADIN) 5 MG tablet Take 2 tablets daily except 1 tablet on Mondays and Fridays or as directed 09/08/17  Yes Jonelle SidleMcDowell, Samuel G, MD  acetaminophen (TYLENOL) 500 MG tablet Take 1,000 mg by mouth daily as needed (arthritis pain). For knee pain.    [provider]  allopurinol (ZYLOPRIM) 300 MG tablet Take 300 mg by mouth daily.    [provider]  metoprolol succinate (TOPROL-XL) 50 MG 24 hr tablet TAKE ONE TABLET BY MOUTH TWICE A DAY TAKE WITH OR IMMEDIATELY FOLLOWING A MEAL 08/29/17   Jonelle SidleMcDowell, Samuel G, MD    Family History Family History  Problem Relation Age of Onset  . Hypertension Father      Social History Social History   Tobacco Use  . Smoking status: Former Smoker    Types: Cigars    Last attempt to quit: 04/24/2010    Years since quitting: 7.6  . Smokeless tobacco: Never Used  Substance Use Topics  . Alcohol use: Yes    Comment: occasional  . Drug use: No     Allergies   Diclofenac and Naproxen   Review of Systems Review of Systems  Constitutional: Positive for activity change and appetite change. Negative for fatigue and fever.  HENT: Negative for congestion and rhinorrhea.   Eyes: Negative for visual disturbance.  Respiratory: Negative for cough, chest tightness and shortness of breath.   Cardiovascular: Negative for chest pain.  Gastrointestinal: Positive for abdominal pain, constipation, diarrhea, nausea and vomiting.  Genitourinary: Negative for dysuria, hematuria, testicular pain  and urgency.  Musculoskeletal: Negative for arthralgias and myalgias.  Skin: Negative for rash.  Neurological: Negative for dizziness, weakness and headaches.    all other systems are negative except as noted in the HPI and PMH.    Physical Exam Updated Vital Signs BP (!) 155/88   Pulse 98   Temp (!) 97.4 F (36.3 C) (Oral)   Resp 18   Ht 5\' 10"  (1.778 m)   Wt 88 kg (194 lb)   SpO2 98%   BMI 27.84 kg/m   Physical Exam  Constitutional: He is oriented to person, place, and time. He appears well-developed and well-nourished. He appears ill. No distress.  Dry heaving  HENT:  Head: Normocephalic and atraumatic.  Mouth/Throat: Oropharynx is clear and moist. No oropharyngeal exudate.  Eyes: Conjunctivae and EOM are normal. Pupils are equal, round, and reactive to light.  Neck: Normal range of motion. Neck supple.  No meningismus.  Cardiovascular: Normal rate, regular rhythm, normal heart sounds and intact distal pulses.  No murmur heard. Pulmonary/Chest: Effort normal and breath sounds normal. No respiratory distress.  Abdominal: Soft. There is tenderness.  There is no rebound and no guarding.  Upper abdominal tenderness  Musculoskeletal: Normal range of motion. He exhibits no edema or tenderness.  Neurological: He is alert and oriented to person, place, and time. No cranial nerve deficit. He exhibits normal muscle tone. Coordination normal.  No ataxia on finger to nose bilaterally. No pronator drift. 5/5 strength throughout. CN 2-12 intact.Equal grip strength. Sensation intact.   Skin: Skin is warm.  Psychiatric: He has a normal mood and affect. His behavior is normal.  Nursing note and vitals reviewed.    ED Treatments / Results  Labs (all labs ordered are listed, but only abnormal results are displayed) Labs Reviewed  CBC WITH DIFFERENTIAL/PLATELET - Abnormal; Notable for the following components:      Result Value   RBC 3.85 (*)    MCV 101.3 (*)    MCH 34.3 (*)    Lymphs Abs 0.5 (*)    All other components within normal limits  COMPREHENSIVE METABOLIC PANEL - Abnormal; Notable for the following components:   CO2 21 (*)    Glucose, Bld 161 (*)    AST 250 (*)    ALT 86 (*)    Alkaline Phosphatase 277 (*)    Total Bilirubin 1.3 (*)    All other components within normal limits  PROTIME-INR - Abnormal; Notable for the following components:   Prothrombin Time 23.7 (*)    All other components within normal limits  LIPASE, BLOOD - Abnormal; Notable for the following components:   Lipase 3,298 (*)    All other components within normal limits  I-STAT CG4 LACTIC ACID, ED - Abnormal; Notable for the following components:   Lactic Acid, Venous 2.85 (*)    All other components within normal limits  URINALYSIS, ROUTINE W REFLEX MICROSCOPIC  I-STAT CG4 LACTIC ACID, ED    EKG  EKG Interpretation None       Radiology Dg Abdomen Acute W/chest  Result Date: 12/02/2017 CLINICAL DATA:  Abdominal pain since this morning. EXAM: DG ABDOMEN ACUTE W/ 1V CHEST COMPARISON:  Chest 02/05/2017 FINDINGS: Postoperative changes in the mediastinum.  Shallow inspiration with atelectasis in the lung bases. Cardiac enlargement without vascular congestion or edema. No focal consolidation. No pneumothorax. Calcification of the aorta. Severe degenerative changes in the shoulders with resection or resorption of the distal left clavicle. Scattered gas and stool throughout the colon.  No small or large bowel distention. No free intra-abdominal air. No abnormal air-fluid levels. Degenerative changes in the spine and hips. IMPRESSION: Shallow inspiration with atelectasis in the lung bases. Cardiac enlargement. Aortic atherosclerosis. Nonobstructive bowel gas pattern with stool-filled colon. Electronically Signed   By: Burman Nieves M.D.   On: 12/02/2017 06:17    Procedures Procedures (including critical care time)  Medications Ordered in ED Medications  sodium chloride 0.9 % bolus 1,000 mL (1,000 mLs Intravenous New Bag/Given 12/02/17 0526)  ondansetron (ZOFRAN) injection 4 mg (4 mg Intravenous Given 12/02/17 0531)  promethazine (PHENERGAN) injection 12.5 mg (12.5 mg Intravenous Given 12/02/17 0531)  morphine 4 MG/ML injection 4 mg (4 mg Intravenous Given 12/02/17 0531)     Initial Impression / Assessment and Plan / ED Course  I have reviewed the triage vital signs and the nursing notes.  Pertinent labs & imaging results that were available during my care of the patient were reviewed by me and considered in my medical decision making (see chart for details).    Abdominal pain with nausea and vomiting and obstipation.  Upper abdominal tenderness on exam with dry heaving.  Will hydrate and check labs.  Obtain x-ray to evaluate for bowel obstruction.  Patient given IV hydration, antiemetics and pain medication.  Labs consistent with elevated LFTs and transaminitis.  Lipase greater than 3000.  Exam presentation consistent with gallstone pancreatitis.  Patient denies abusing any alcohol on a regular basis.  Abdomen is soft without peritoneal  signs.  CT scan will be obtained for further evaluation of biliary tree.  Low suspicion for cholangitis.  Patient is afebrile without leukocytosis.  Admission to hospital discussed with Dr. Arbutus Leas. Final Clinical Impressions(s) / ED Diagnoses   Final diagnoses:  Gallstone pancreatitis    ED Discharge Orders    None       Stephnie Parlier, Jeannett Senior, MD 12/02/17 [phone]

## 2017-12-02 NOTE — Consult Note (Addendum)
Cardiology Consultation:   Patient ID: BUFFORD HELMS; [id number]; [birth year]   Admit date: 12/02/2017 Date of Consult: 12/02/2017  Primary Care Provider: Benita Stabile, MD Primary Cardiologist: Dr Simona Huh Primary Electrophysiologist:  n/a   Patient Profile:   [name] is a 72 y.o. male with a hx of afib, cardioembolic MI, bioprosthatic AVR who is being seen today for the evaluation of preoperative evluation in setting of chronic anticoagulation at the request of Dr Tat.  History of Present Illness:   [name] is 72 yo male with history of chronic afib, bioprosthetic AVR, CAD, prior LA thrombus and cardioembolic event leading to MI and VF arrest, COPD, HTN admitted with N/V and adbominal pain. Found to have gallstone pancreatitis, being considered for ERCP and stone removal.    He denies any cardiopulmonary symptoms. No recent chest pain, SOB, DOE. No edema, no palpitations. Reports he remains active working outside, walking the golf course. Tolerates these regular activities without limitation.    04/2010 TEE: + thrombus WBC 8.4 Hgb 13.2 Plt 221 K 3.5 Cr 1.09 INR 2.14 lipase 3300 lactic acid 2.85  07/2016 echo LVEF 45-50% with diffuse hypokinesis, normal AVR EKG afib with occasional PVCs Past Medical History:  Diagnosis Date  . Aortic stenosis    a. s/p bioprosth AVR 04/2010;  b. 05/2012 Echo: EF 50-55%, nl bioprosth fxn w/ gradient, mold dil LA.  Marland Kitchen Atrial fibrillation and flutter (HCC)    a. s/p Cox-Maze @ time of AVR;  b. Previously on Amio and s/p DCCV x 2 ->failed->now rate controlled and on chronic coumadin.  Marland Kitchen CAD (coronary artery disease)    Occluded OM1 and LPDA in the setting of left atrial thrombus and cardioembolic event, otherwise nonobstructive disease 04/2010  . COPD (chronic obstructive pulmonary disease) (HCC)   . Diverticulosis   . DJD (degenerative joint disease)    a. s/p R TKA 2010;  b. s/p L TKA 2013.  Marland Kitchen Essential hypertension   . Gout   . History  of pneumonia   . Myocardial infarction (HCC) 04/2010   Cardioembolic with occluded OM1 and left-sided PDA  . 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 Ventricular fibrillation (HCC) 04/2010   VF arrest in setting of thrombotic MI 04/2010  . Wears glasses     Past Surgical History:  Procedure Laterality Date  . Aortic valve replacement with pericardial tissue valve, [place]  05/04/2010  . CARPAL TUNNEL RELEASE Right 10/18/2013   Procedure: RIGHT CARPAL TUNNEL RELEASE;  Surgeon: Nicki Reaper, MD;  Location: [facility];  Service: Orthopedics;  Laterality: Right;  . CARPAL TUNNEL RELEASE Left 02/09/2014   Procedure: CARPAL TUNNEL RELEASE LEFT;  Surgeon: Nicki Reaper, MD;  Location: [facility];  Service: Orthopedics;  Laterality: Left;  . CATARACT EXTRACTION W/PHACO Right 03/04/2016   Procedure: CATARACT EXTRACTION PHACO AND INTRAOCULAR LENS PLACEMENT (IOC);  Surgeon: Gemma Payor, MD;  Location: [facility];  Service: Ophthalmology;  Laterality: Right;  CDE:5.43  . COLONOSCOPY  2005   Dr. Jena Gauss: internal hemorrhoids, pancolonic diverticula  . INGUINAL HERNIA REPAIR Right 08/25/2013   Procedure: HERNIA REPAIR INGUINAL ADULT;  Surgeon: Shelly Rubenstein, MD;  Location: [facility];  Service: General;  Laterality: Right;  . INSERTION OF MESH Right 08/25/2013   Procedure: INSERTION OF MESH;  Surgeon: Shelly Rubenstein, MD;  Location: [facility];  Service: General;  Laterality: Right;  . RETINAL LASER PROCEDURE     right eye   .  Right ankle pinning     Fell from tree w/fracture several years ago.  . Right total knee replacement  04/02/2011  . TOTAL KNEE ARTHROPLASTY  07/20/2012   Procedure: TOTAL KNEE ARTHROPLASTY;  Surgeon: Nilda Simmer, MD;  Location: [facility];  Service: Orthopedics;  Laterality: Left;  left total knee arthroplasty  . TRIGGER FINGER RELEASE Left 02/09/2014   Procedure: RELEASE A-1 PULLEY LEFT SMALL FINGER;  Surgeon: Nicki Reaper, MD;  Location: [facility];  Service: Orthopedics;  Laterality: Left;  . ULNAR NERVE TRANSPOSITION Right 10/18/2013   Procedure: RIGHT ULNAR NERVE DECOMPRESSION/POSSIBLE TRANSPOSITION;  Surgeon: Nicki Reaper, MD;  Location: [facility];  Service: Orthopedics;  Laterality: Right;  . ULNAR NERVE TRANSPOSITION Left 02/09/2014   Procedure: DECOMPRESSION LEFT ULNAR NERVE ;  Surgeon: Nicki Reaper, MD;  Location: [facility];  Service: Orthopedics;  Laterality: Left;      Inpatient Medications: Scheduled Meds: . metoprolol tartrate  2.5 mg Intravenous Q6H   Continuous Infusions: . 0.9 % NaCl with KCl 20 mEq / L 75 mL/hr at 12/02/17 1007  . piperacillin-tazobactam (ZOSYN)  IV 3.375 g (12/02/17 1150)   PRN Meds: acetaminophen **OR** acetaminophen, HYDROmorphone (DILAUDID) injection, ondansetron **OR** ondansetron (ZOFRAN) IV  Allergies:    Allergies  Allergen Reactions  . Diclofenac Itching  . Naproxen     itching    Social History:   Social History   Socioeconomic History  . Marital status: Married    Spouse name: Not on file  . Number of children: Not on file  . Years of education: Not on file  . Highest education level: Not on file  Social Needs  . Financial resource strain: Not on file  . Food insecurity - worry: Not on file  . Food insecurity - inability: Not on file  . Transportation needs - medical: Not on file  . Transportation needs - non-medical: Not on file  Occupational History  . Occupation: retired  Tobacco Use  . Smoking status: Former Smoker    Types: Cigars    Last attempt to quit: 04/24/2010    Years since quitting: 7.6  . Smokeless tobacco: Never Used  Substance and Sexual Activity  . Alcohol use: Yes    Comment: occasional, possibly every 2-3 days   . Drug use: No  . Sexual activity: Not Currently  Other Topics Concern  . Not on file  Social History Narrative  . Not on file    Family History:    Family History  Problem Relation Age of  Onset  . Hypertension Father   . Colon cancer Neg Hx      ROS:  Please see the history of present illness.  ROS  All other ROS reviewed and negative.     Physical Exam/Data:   Vitals:   12/02/17 0755 12/02/17 0800 12/02/17 0923 12/02/17 1330  BP: 127/70 113/76 109/64 113/82  Pulse: 76 (!) 102 100 89  Resp: 18  18 20   Temp:   99 F (37.2 C) 98.9 F (37.2 C)  TempSrc:   Oral Oral  SpO2: (!) 89% 92% 99% 99%  Weight:   194 lb 0.1 oz (88 kg)   Height:   5\' 10"  (1.778 m)     Intake/Output Summary (Last 24 hours) at 12/02/2017 1628 Last data filed at 12/02/2017 0743 Gross per 24 hour  Intake 2000 ml  Output -  Net 2000 ml   Filed Weights   12/02/17  5409 12/02/17 0923  Weight: 194 lb (88 kg) 194 lb 0.1 oz (88 kg)   Body mass index is 27.84 kg/m.  General:  Well nourished, well developed, in no acute distress HEENT: normal Lymph: no adenopathy Neck: no JVD Endocrine:  No thryomegaly Cardiac:  Irreg, no m/r/g, no jvd Lungs:  clear to auscultation bilaterally, no wheezing, rhonchi or rales  Abd: tender to palpation Ext: no edema Musculoskeletal:  No deformities, BUE and BLE strength normal and equal Skin: warm and dry  Neuro:  CNs 2-12 intact, no focal abnormalities noted Psych:  Normal affect    Laboratory Data:  Chemistry Recent Labs  Lab 12/02/17 0517  NA 135  K 3.5  CL 101  CO2 21*  GLUCOSE 161*  BUN 20  CREATININE 1.09  CALCIUM 9.1  GFRNONAA >60  GFRAA >60  ANIONGAP 13    Recent Labs  Lab 12/02/17 0517  PROT 7.4  ALBUMIN 3.5  AST 250*  ALT 86*  ALKPHOS 277*  BILITOT 1.3*   Hematology Recent Labs  Lab 12/02/17 0517  WBC 8.4  RBC 3.85*  HGB 13.2  HCT 39.0  MCV 101.3*  MCH 34.3*  MCHC 33.8  RDW 12.6  PLT 221   Cardiac EnzymesNo results for input(s): TROPONINI in the last 168 hours. No results for input(s): TROPIPOC in the last 168 hours.  BNPNo results for input(s): BNP, PROBNP in the last 168 hours.  DDimer No results for  input(s): DDIMER in the last 168 hours.  Radiology/Studies:  Ct Abdomen Pelvis W Contrast  Result Date: 12/02/2017 CLINICAL DATA:  Altered bowel habits. Treatment for diarrhea has resulted in no bowel movement for 2 days. Increasing abdominal pain. EXAM: CT ABDOMEN AND PELVIS WITH CONTRAST TECHNIQUE: Multidetector CT imaging of the abdomen and pelvis was performed using the standard protocol following bolus administration of intravenous contrast. CONTRAST:  ISOVUE-300 IOPAMIDOL (ISOVUE-300) INJECTION 61% COMPARISON:  Multiple exams, including CT scan from 07/23/2012 FINDINGS: Lower chest: Moderate cardiomegaly. Aortic valve prosthesis. Posterior wall calcifications in the left atrium and to a lesser extent in the right atrium. Thinning of the left ventricular myocardium. Mild atelectasis in the lingula and mild dependent atelectasis in both lower lobes. Circumflex coronary artery atherosclerotic calcification. Hepatobiliary: Mild intrahepatic biliary dilatation. Common hepatic duct 1.6 cm, formerly 0.8 cm on 07/23/2012. Common bile duct 1.2 cm, formerly 0.8 cm. This CBD dilatation extends to the ampulla Dependent densities in the gallbladder are compatible with gallstones. Borderline wall thickening in portions of the gallbladder. Pancreas: Abnormal peripancreatic stranding diffusely without pseudocyst, abscess, or necrosis. Spleen: Unremarkable Adrenals/Urinary Tract: Stable fullness of the right adrenal gland. Left adrenal gland unremarkable. 0.7 by 1.4 cm hypodense lesion of the left kidney upper pole is likely a small cyst but technically too small to characterize. Stomach/Bowel: Mild wall thickening and accentuated enhancement in the duodenal bulb, descending duodenum, and proximal transverse duodenum noted, secondary inflammation is favored over peptic ulcer disease. Several small diverticula of the transverse duodenum do not appear to be the epicenter of the regional inflammatory process.  Descending and sigmoid colon diverticulosis without active diverticulitis. Scattered diverticula of the ascending colon noted. Appendix normal. Vascular/Lymphatic: Aortoiliac atherosclerotic vascular disease. Reproductive: Unremarkable Other: Small amount of free fluid adjacent to the descending duodenum and tracking in the right paracolic gutter. Small amount of central mesenteric edema. Musculoskeletal: Old healed right lower lateral rib fractures. Subacute healing fracture of the left anterior fifth and sixth ribs. Prior median sternotomy. Mildly exaggerated lumbar lordosis. Grade  1 degenerative anterolisthesis at L5-S1 with slightly transitional S1 level. Lumbar spondylosis and degenerative disc disease with mild impingement at L4-5 and L5-S1. IMPRESSION: 1. Acute pancreatitis with peripancreatic stranding and a small amount of ascites tracking in the right paracolic gutter. No findings of pancreatic abscess, necrosis, or pseudocyst. 2. Gallstones are present in the gallbladder and there is new intrahepatic and extrahepatic biliary dilatation, raising the strong possibility of occult distal CBD stone as a potential cause for the pancreatitis and biliary dilatation. Minimal gallbladder wall thickening. 3. Subacute healing fractures of the left anterior fifth and sixth ribs. 4. Cardiomegaly with wall calcifications along the atria and thinning of the left ventricular myocardium. Coronary atherosclerosis. Scattered colonic diverticulosis diverticula of the transverse duodenum. There are also some. 5.  Aortic Atherosclerosis (ICD10-I70.0). 6. Mild lumbar impingement at L4-5 and L5-S1. Electronically Signed   By: Gaylyn Rong M.D.   On: 12/02/2017 07:48   Mr 3d Recon At Scanner  Result Date: 12/02/2017 CLINICAL DATA:  Upper abdominal pain. Abnormal liver function tests. Dilated common bile duct on ultrasound. Acute pancreatitis on CT scan. EXAM: MRI ABDOMEN WITHOUT AND WITH CONTRAST (INCLUDING MRCP)  TECHNIQUE: Multiplanar multisequence MR imaging of the abdomen was performed both before and after the administration of intravenous contrast. Heavily T2-weighted images of the biliary and pancreatic ducts were obtained, and three-dimensional MRCP images were rendered by post processing. CONTRAST:  18mL MULTIHANCE GADOBENATE DIMEGLUMINE 529 MG/ML IV SOLN COMPARISON:  12/02/2017 CT scan FINDINGS: Despite efforts by the technologist and patient, motion artifact is present on today's exam and could not be eliminated. This is a common result when MRCP is attempted in the inpatient setting where patients are less likely to be able to breath hold and cooperate in controlling motion. This reduces exam sensitivity and specificity. Lower chest: Stable cardiomegaly. Aortic valve prosthesis. Mild atelectasis in the posterior basal segments of both lower lobes. Hepatobiliary: Cholelithiasis is observed. On images 45 through 50 of series 5, there is low signal intensity filling defect in the distal CBD compatible with small stones in the setting of choledocholithiasis. This is a likely cause for the biliary dilatation and pancreatitis. There appear to be multiple clustered calculi in the 5 mm in less range in the vicinity of the ampulla. The filling defects are also shown on image 23/4 and images 29 through 31 of series 24. Mild extrahepatic biliary dilatation, with the common bile duct at about 9 mm. Pancreas: Peripancreatic edema compatible with acute pancreatitis. No abscess, pseudocyst, or findings of pancreatic necrosis. Spleen:  Unremarkable Adrenals/Urinary Tract:  Unremarkable Stomach/Bowel: Mild secondary inflammation of the duodenum. Vascular/Lymphatic:  Aortoiliac atherosclerotic vascular disease. Other: Small amount of ascites along the inferior right hepatic margin. Musculoskeletal: Unremarkable IMPRESSION: 1. Choledocholithiasis with several clustered small (less than 5 mm) stones in the distal CBD. These at the  likely cause of the mild biliary dilatation and acute pancreatitis. 2. Peripancreatic edema compatible with acute pancreatitis. No findings of pancreatic abscess, pseudocyst, or pancreatic necrosis. 3. Stable cardiomegaly with aortic valve prosthesis. 4. Mild subsegmental atelectasis in the posterior basal segments of both lower lobes. 5. Gallstones in the gallbladder. 6.  Aortic Atherosclerosis (ICD10-I70.0). Electronically Signed   By: Gaylyn Rong M.D.   On: 12/02/2017 15:13   Dg Abdomen Acute W/chest  Result Date: 12/02/2017 CLINICAL DATA:  Abdominal pain since this morning. EXAM: DG ABDOMEN ACUTE W/ 1V CHEST COMPARISON:  Chest 02/05/2017 FINDINGS: Postoperative changes in the mediastinum. Shallow inspiration with atelectasis in the lung bases.  Cardiac enlargement without vascular congestion or edema. No focal consolidation. No pneumothorax. Calcification of the aorta. Severe degenerative changes in the shoulders with resection or resorption of the distal left clavicle. Scattered gas and stool throughout the colon. No small or large bowel distention. No free intra-abdominal air. No abnormal air-fluid levels. Degenerative changes in the spine and hips. IMPRESSION: Shallow inspiration with atelectasis in the lung bases. Cardiac enlargement. Aortic atherosclerosis. Nonobstructive bowel gas pattern with stool-filled colon. Electronically Signed   By: Burman Nieves M.D.   On: 12/02/2017 06:17   Mr Abdomen Mrcp Vivien Rossetti Contast  Result Date: 12/02/2017 CLINICAL DATA:  Upper abdominal pain. Abnormal liver function tests. Dilated common bile duct on ultrasound. Acute pancreatitis on CT scan. EXAM: MRI ABDOMEN WITHOUT AND WITH CONTRAST (INCLUDING MRCP) TECHNIQUE: Multiplanar multisequence MR imaging of the abdomen was performed both before and after the administration of intravenous contrast. Heavily T2-weighted images of the biliary and pancreatic ducts were obtained, and three-dimensional MRCP images were  rendered by post processing. CONTRAST:  18mL MULTIHANCE GADOBENATE DIMEGLUMINE 529 MG/ML IV SOLN COMPARISON:  12/02/2017 CT scan FINDINGS: Despite efforts by the technologist and patient, motion artifact is present on today's exam and could not be eliminated. This is a common result when MRCP is attempted in the inpatient setting where patients are less likely to be able to breath hold and cooperate in controlling motion. This reduces exam sensitivity and specificity. Lower chest: Stable cardiomegaly. Aortic valve prosthesis. Mild atelectasis in the posterior basal segments of both lower lobes. Hepatobiliary: Cholelithiasis is observed. On images 45 through 50 of series 5, there is low signal intensity filling defect in the distal CBD compatible with small stones in the setting of choledocholithiasis. This is a likely cause for the biliary dilatation and pancreatitis. There appear to be multiple clustered calculi in the 5 mm in less range in the vicinity of the ampulla. The filling defects are also shown on image 23/4 and images 29 through 31 of series 24. Mild extrahepatic biliary dilatation, with the common bile duct at about 9 mm. Pancreas: Peripancreatic edema compatible with acute pancreatitis. No abscess, pseudocyst, or findings of pancreatic necrosis. Spleen:  Unremarkable Adrenals/Urinary Tract:  Unremarkable Stomach/Bowel: Mild secondary inflammation of the duodenum. Vascular/Lymphatic:  Aortoiliac atherosclerotic vascular disease. Other: Small amount of ascites along the inferior right hepatic margin. Musculoskeletal: Unremarkable IMPRESSION: 1. Choledocholithiasis with several clustered small (less than 5 mm) stones in the distal CBD. These at the likely cause of the mild biliary dilatation and acute pancreatitis. 2. Peripancreatic edema compatible with acute pancreatitis. No findings of pancreatic abscess, pseudocyst, or pancreatic necrosis. 3. Stable cardiomegaly with aortic valve prosthesis. 4. Mild  subsegmental atelectasis in the posterior basal segments of both lower lobes. 5. Gallstones in the gallbladder. 6.  Aortic Atherosclerosis (ICD10-I70.0). Electronically Signed   By: Gaylyn Rong M.D.   On: 12/02/2017 15:13   US Abdomen Limited Ruq  Result Date: 12/02/2017 CLINICAL DATA:  Acute cholelithiasis, pancreatitis. EXAM: ULTRASOUND ABDOMEN LIMITED RIGHT UPPER QUADRANT COMPARISON:  CT of the abdomen and pelvis from the same day. FINDINGS: Gallbladder: Sludge is present within the gallbladder. No visible stones are present. Wall is thickened, measuring up to 7 mm. There is no sonographic Murphy sign. Common bile duct: Diameter: The common bile duct is dilated measuring up to 11.6 mm. Liver: Liver is diffusely echogenic. No discrete lesions are present. Portal vein is patent on color Doppler imaging with normal direction of blood flow towards the liver. IMPRESSION: 1.  Dilated common bile duct likely associated with the acute pancreatitis. 2. Gallbladder wall thickening and sludge may also be associated with acute pancreatitis. 3. No gallstones or sonographic Murphy sign to suggest primary gallbladder pathology. Electronically Signed   By: Marin Robertshristopher  Mattern M.D.   On: 12/02/2017 11:27    Assessment and Plan:   1. Chronic Afib - chronic afib rate controlled on coumadin at home. Would expect some mildly elevated rates in setting of gallstone pancreatitis with ongoing pain.  - given his prior cardioembolic event would recommend heparin bridge when INR is less than 2 as acceptable for the upcoming GI procedure. Ok to be off all anticoag as needed for GI procedure, restart hep gtt and coumadin when ok from procedure standpoint.   2. History of bioprosthetic AVR - with bioprosthetic AVR he has no specific indication for anticoagulation from a valve standpoint, however given his afib and prior cardioembolic event will need heparin bridging when INR is <2.  - last echo with normal function. No  recent symptoms. No significant murmur on exam. Mildly elevated JVD, no edema on CXR, would avoid any further IVF boluses, maintence IVF is ok. No symptoms of CHF.   3. CAD - no evidence of acute issues - from cath note appears main issues was emboli as opposed to obstructive CAD when he had his cath   4. Gallstone pancreatitis/Preop evaluation - plans for ERCP and stone removal.  - from cardiac standpoint he has no active acute conditions. Chronic conditions are stable. Tolerates greater than 4METs without limitatoin. Recommend proceeding with procedure/surgery. Ok to receive oral vitamin K if needed for procedure.    No further cardiology recs at this time, heparin bridging as described above. Call with questions, we will sign off.    For questions or updates, please contact [facility] Please consult [email or web address] for contact info under Cardiology/STEMI.   Joanie CoddingtonSigned, Ory Elting, MD  12/02/2017 4:28 PM

## 2017-12-03 ENCOUNTER — Encounter (HOSPITAL_COMMUNITY)
Admission: EM | Disposition: A | Discharge status: Home / Self Care | Payer: Self-pay | Source: Home / Self Care | Attending: Internal Medicine

## 2017-12-03 ENCOUNTER — Encounter (HOSPITAL_COMMUNITY): Payer: Self-pay | Admitting: Anesthesiology

## 2017-12-03 DIAGNOSIS — K8050 Calculus of bile duct without cholangitis or cholecystitis without obstruction: Secondary | ICD-10-CM

## 2017-12-03 LAB — URINALYSIS, ROUTINE W REFLEX MICROSCOPIC
BACTERIA UA: NONE SEEN
BILIRUBIN URINE: NEGATIVE
Glucose, UA: NEGATIVE mg/dL
KETONES UR: NEGATIVE mg/dL
Leukocytes, UA: NEGATIVE
Nitrite: NEGATIVE
PROTEIN: NEGATIVE mg/dL
Specific Gravity, Urine: 1.036 — ABNORMAL HIGH (ref 1.005–1.030)
pH: 5 (ref 5.0–8.0)

## 2017-12-03 LAB — COMPREHENSIVE METABOLIC PANEL
ALT: 67 U/L — ABNORMAL HIGH (ref 17–63)
ANION GAP: 11 (ref 5–15)
AST: 84 U/L — ABNORMAL HIGH (ref 15–41)
Albumin: 3 g/dL — ABNORMAL LOW (ref 3.5–5.0)
Alkaline Phosphatase: 148 U/L — ABNORMAL HIGH (ref 38–126)
BILIRUBIN TOTAL: 1.5 mg/dL — AB (ref 0.3–1.2)
BUN: 24 mg/dL — ABNORMAL HIGH (ref 6–20)
CALCIUM: 7.9 mg/dL — AB (ref 8.9–10.3)
CO2: 22 mmol/L (ref 22–32)
Chloride: 106 mmol/L (ref 101–111)
Creatinine, Ser: 1.39 mg/dL — ABNORMAL HIGH (ref 0.61–1.24)
GFR calc non Af Amer: 49 mL/min — ABNORMAL LOW (ref >60)
GFR, EST AFRICAN AMERICAN: 57 mL/min — AB (ref >60)
GLUCOSE: 111 mg/dL — AB (ref 65–99)
POTASSIUM: 4.8 mmol/L (ref 3.5–5.1)
SODIUM: 139 mmol/L (ref 135–145)
TOTAL PROTEIN: 6.5 g/dL (ref 6.5–8.1)

## 2017-12-03 LAB — CBC
HEMATOCRIT: 34.6 % — AB (ref 39.0–52.0)
HEMOGLOBIN: 11.1 g/dL — AB (ref 13.0–17.0)
MCH: 33.9 pg (ref 26.0–34.0)
MCHC: 32.1 g/dL (ref 30.0–36.0)
MCV: 105.8 fL — ABNORMAL HIGH (ref 78.0–100.0)
Platelets: 169 10*3/uL (ref 150–400)
RBC: 3.27 MIL/uL — AB (ref 4.22–5.81)
RDW: 13.1 % (ref 11.5–15.5)
WBC: 8.1 10*3/uL (ref 4.0–10.5)

## 2017-12-03 LAB — LIPASE, BLOOD: LIPASE: 1375 U/L — AB (ref 11–51)

## 2017-12-03 LAB — PROTIME-INR
INR: 1.98
PROTHROMBIN TIME: 22.4 s — AB (ref 11.4–15.2)

## 2017-12-03 SURGERY — ERCP, WITH INTERVENTION IF INDICATED
Anesthesia: General

## 2017-12-03 MED ORDER — VITAMIN K1 10 MG/ML IJ SOLN
5.0000 mg | Freq: Once | INTRAVENOUS | Status: AC
  Administered 2017-12-03: 5 mg via INTRAVENOUS
  Filled 2017-12-03: qty 0.5

## 2017-12-03 MED ORDER — HEPARIN (PORCINE) IN NACL 100-0.45 UNIT/ML-% IJ SOLN: 1400.0000 [IU]/h | INTRAMUSCULAR | Status: DC

## 2017-12-03 NOTE — Progress Notes (Addendum)
ANTICOAGULATION CONSULT NOTE - Initial Consult  Pharmacy Consult for heparin Indication: atrial fibrillation  Allergies  Allergen Reactions  . Diclofenac Itching  . Naproxen     itching    Patient Measurements: Height: 5\' 10"  (177.8 cm) Weight: 194 lb 0.1 oz (88 kg) IBW/kg (Calculated) : 73 Heparin Dosing Weight: 88 kg  Vital Signs: Temp: 98.1 F (36.7 C) (01/23 0416) Temp Source: Oral (01/23 0416) BP: 94/71 (01/23 0416) Pulse Rate: 96 (01/23 0416)  Labs: Recent Labs    12/02/17 0517 12/03/17 0435  HGB 13.2 11.1*  HCT 39.0 34.6*  PLT 221 169  LABPROT 23.7* 22.4*  INR 2.14 1.98  CREATININE 1.09 1.39*    Estimated Creatinine Clearance: 53.7 mL/min (A) (by C-G formula based on SCr of 1.39 mg/dL (H)).   Medical History: Past Medical History:  Diagnosis Date  . Aortic stenosis    a. s/p bioprosth AVR 04/2010;  b. 05/2012 Echo: EF 50-55%, nl bioprosth fxn w/ 8mmHg gradient, mold dil LA.  Marland Kitchen. Atrial fibrillation and flutter (HCC)    a. s/p Cox-Maze @ time of AVR;  b. Previously on Amio and s/p DCCV x 2 ->failed->now rate controlled and on chronic coumadin.  Marland Kitchen. CAD (coronary artery disease)    Occluded OM1 and LPDA in the setting of left atrial thrombus and cardioembolic event, otherwise nonobstructive disease 04/2010  . COPD (chronic obstructive pulmonary disease) (HCC)   . Diverticulosis   . DJD (degenerative joint disease)    a. s/p R TKA 2010;  b. s/p L TKA 2013.  Marland Kitchen. Essential hypertension   . Gout   . History of pneumonia   . Myocardial infarction (HCC) 04/2010   Cardioembolic with occluded OM1 and left-sided PDA  . 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 Ventricular fibrillation (HCC) 04/2010   VF arrest in setting of thrombotic MI 04/2010  . Wears glasses     Medications:  Medications Prior to Admission  Medication Sig Dispense Refill Last Dose  . azithromycin (ZITHROMAX) 250 MG tablet Take 250 mg by mouth daily.     . benzonatate (TESSALON) 200 MG  capsule Take 200 mg by mouth 2 (two) times daily as needed for cough.     Marland Kitchen. lisinopril (PRINIVIL,ZESTRIL) 5 MG tablet TAKE ONE (1) TABLET EACH DAY 90 tablet 3   . metoprolol succinate (TOPROL-XL) 50 MG 24 hr tablet Take 1 tablet (50 mg total) by mouth 2 (two) times daily. Take with or immediately following a meal. 60 tablet 6   . warfarin (COUMADIN) 5 MG tablet Take 2 tablets daily except 1 tablet on Mondays and Fridays or as directed 60 tablet 4     Assessment: 71 yoman on coumadin PTA for afib to start heparin bridge for planned ERCP.  His INR today is 1.98 and he received Vit K 5 mg IV.   Goal of Therapy:  Heparin level 0.3-0.7 units/ml Monitor platelets by anticoagulation protocol: Yes   Plan:  Start heparin bridge after ERCP. Thanks for allowing pharmacy to be a part of this patient's care.  Talbert CageLora Veto Macqueen, PharmD Clinical Pharmacist 12/03/2017,9:18 AM

## 2017-12-03 NOTE — Progress Notes (Addendum)
Subjective: Abdominal pain intermittent but improved. No nausea. Desires to drink liquids.   Objective: Vital signs in last 24 hours: Temp:  [98.1 F (36.7 C)-99.6 F (37.6 C)] 98.1 F (36.7 C) (01/23 0416) Pulse Rate:  [89-102] 96 (01/23 0416) Resp:  [12-20] 20 (01/23 0416) BP: (94-113)/(64-84) 94/71 (01/23 0416) SpO2:  [92 %-99 %] 93 % (01/23 0416) Weight:  [194 lb 0.1 oz (88 kg)] 194 lb 0.1 oz (88 kg) (01/22 0923) Last BM Date: 12/01/17 General:   Alert and oriented, pleasant Head:  Normocephalic and atraumatic. Abdomen:  +BS, distended but soft, mild TTP LUQ, LLQ, no rebound or guarding Msk:  Symmetrical without gross deformities. Normal posture. Extremities:  Without edema. Neurologic:  Alert and  oriented x4 Psych:  Alert and cooperative. Normal mood and affect.  Intake/Output from previous day: 01/22 0701 - 01/23 0700 In: 1538.8 [I.V.:488.8; IV Piggyback:1050] Out: -  Intake/Output this shift: No intake/output data recorded.  Lab Results: Recent Labs    12/02/17 0517 12/03/17 0435  WBC 8.4 8.1  HGB 13.2 11.1*  HCT 39.0 34.6*  PLT 221 169   BMET Recent Labs    12/02/17 0517 12/03/17 0435  NA 135 139  K 3.5 4.8  CL 101 106  CO2 21* 22  GLUCOSE 161* 111*  BUN 20 24*  CREATININE 1.09 1.39*  CALCIUM 9.1 7.9*   LFT Recent Labs    12/02/17 0517 12/03/17 0435  PROT 7.4 6.5  ALBUMIN 3.5 3.0*  AST 250* 84*  ALT 86* 67*  ALKPHOS 277* 148*  BILITOT 1.3* 1.5*   PT/INR Recent Labs    12/02/17 0517 12/03/17 0435  LABPROT 23.7* 22.4*  INR 2.14 1.98    Studies/Results: Ct Abdomen Pelvis W Contrast  Result Date: 12/02/2017 CLINICAL DATA:  Altered bowel habits. Treatment for diarrhea has resulted in no bowel movement for 2 days. Increasing abdominal pain. EXAM: CT ABDOMEN AND PELVIS WITH CONTRAST TECHNIQUE: Multidetector CT imaging of the abdomen and pelvis was performed using the standard protocol following bolus administration of intravenous  contrast. CONTRAST:  ISOVUE-300 IOPAMIDOL (ISOVUE-300) INJECTION 61% COMPARISON:  Multiple exams, including CT scan from 07/23/2012 FINDINGS: Lower chest: Moderate cardiomegaly. Aortic valve prosthesis. Posterior wall calcifications in the left atrium and to a lesser extent in the right atrium. Thinning of the left ventricular myocardium. Mild atelectasis in the lingula and mild dependent atelectasis in both lower lobes. Circumflex coronary artery atherosclerotic calcification. Hepatobiliary: Mild intrahepatic biliary dilatation. Common hepatic duct 1.6 cm, formerly 0.8 cm on 07/23/2012. Common bile duct 1.2 cm, formerly 0.8 cm. This CBD dilatation extends to the ampulla Dependent densities in the gallbladder are compatible with gallstones. Borderline wall thickening in portions of the gallbladder. Pancreas: Abnormal peripancreatic stranding diffusely without pseudocyst, abscess, or necrosis. Spleen: Unremarkable Adrenals/Urinary Tract: Stable fullness of the right adrenal gland. Left adrenal gland unremarkable. 0.7 by 1.4 cm hypodense lesion of the left kidney upper pole is likely a small cyst but technically too small to characterize. Stomach/Bowel: Mild wall thickening and accentuated enhancement in the duodenal bulb, descending duodenum, and proximal transverse duodenum noted, secondary inflammation is favored over peptic ulcer disease. Several small diverticula of the transverse duodenum do not appear to be the epicenter of the regional inflammatory process. Descending and sigmoid colon diverticulosis without active diverticulitis. Scattered diverticula of the ascending colon noted. Appendix normal. Vascular/Lymphatic: Aortoiliac atherosclerotic vascular disease. Reproductive: Unremarkable Other: Small amount of free fluid adjacent to the descending duodenum and tracking in the right  paracolic gutter. Small amount of central mesenteric edema. Musculoskeletal: Old healed right lower lateral rib fractures.  Subacute healing fracture of the left anterior fifth and sixth ribs. Prior median sternotomy. Mildly exaggerated lumbar lordosis. Grade 1 degenerative anterolisthesis at L5-S1 with slightly transitional S1 level. Lumbar spondylosis and degenerative disc disease with mild impingement at L4-5 and L5-S1. IMPRESSION: 1. Acute pancreatitis with peripancreatic stranding and a small amount of ascites tracking in the right paracolic gutter. No findings of pancreatic abscess, necrosis, or pseudocyst. 2. Gallstones are present in the gallbladder and there is new intrahepatic and extrahepatic biliary dilatation, raising the strong possibility of occult distal CBD stone as a potential cause for the pancreatitis and biliary dilatation. Minimal gallbladder wall thickening. 3. Subacute healing fractures of the left anterior fifth and sixth ribs. 4. Cardiomegaly with wall calcifications along the atria and thinning of the left ventricular myocardium. Coronary atherosclerosis. Scattered colonic diverticulosis diverticula of the transverse duodenum. There are also some. 5.  Aortic Atherosclerosis (ICD10-I70.0). 6. Mild lumbar impingement at L4-5 and L5-S1. Electronically Signed   By: Gaylyn Rong M.D.   On: 12/02/2017 07:48   Mr 3d Recon At Scanner  Result Date: 12/02/2017 CLINICAL DATA:  Upper abdominal pain. Abnormal liver function tests. Dilated common bile duct on ultrasound. Acute pancreatitis on CT scan. EXAM: MRI ABDOMEN WITHOUT AND WITH CONTRAST (INCLUDING MRCP) TECHNIQUE: Multiplanar multisequence MR imaging of the abdomen was performed both before and after the administration of intravenous contrast. Heavily T2-weighted images of the biliary and pancreatic ducts were obtained, and three-dimensional MRCP images were rendered by post processing. CONTRAST:  18mL MULTIHANCE GADOBENATE DIMEGLUMINE 529 MG/ML IV SOLN COMPARISON:  12/02/2017 CT scan FINDINGS: Despite efforts by the technologist and patient, motion  artifact is present on today's exam and could not be eliminated. This is a common result when MRCP is attempted in the inpatient setting where patients are less likely to be able to breath hold and cooperate in controlling motion. This reduces exam sensitivity and specificity. Lower chest: Stable cardiomegaly. Aortic valve prosthesis. Mild atelectasis in the posterior basal segments of both lower lobes. Hepatobiliary: Cholelithiasis is observed. On images 45 through 50 of series 5, there is low signal intensity filling defect in the distal CBD compatible with small stones in the setting of choledocholithiasis. This is a likely cause for the biliary dilatation and pancreatitis. There appear to be multiple clustered calculi in the 5 mm in less range in the vicinity of the ampulla. The filling defects are also shown on image 23/4 and images 29 through 31 of series 24. Mild extrahepatic biliary dilatation, with the common bile duct at about 9 mm. Pancreas: Peripancreatic edema compatible with acute pancreatitis. No abscess, pseudocyst, or findings of pancreatic necrosis. Spleen:  Unremarkable Adrenals/Urinary Tract:  Unremarkable Stomach/Bowel: Mild secondary inflammation of the duodenum. Vascular/Lymphatic:  Aortoiliac atherosclerotic vascular disease. Other: Small amount of ascites along the inferior right hepatic margin. Musculoskeletal: Unremarkable IMPRESSION: 1. Choledocholithiasis with several clustered small (less than 5 mm) stones in the distal CBD. These at the likely cause of the mild biliary dilatation and acute pancreatitis. 2. Peripancreatic edema compatible with acute pancreatitis. No findings of pancreatic abscess, pseudocyst, or pancreatic necrosis. 3. Stable cardiomegaly with aortic valve prosthesis. 4. Mild subsegmental atelectasis in the posterior basal segments of both lower lobes. 5. Gallstones in the gallbladder. 6.  Aortic Atherosclerosis (ICD10-I70.0). Electronically Signed   By: Gaylyn Rong M.D.   On: 12/02/2017 15:13   Dg Abdomen Acute W/chest  Result Date: 12/02/2017 CLINICAL DATA:  Abdominal pain since this morning. EXAM: DG ABDOMEN ACUTE W/ 1V CHEST COMPARISON:  Chest 02/05/2017 FINDINGS: Postoperative changes in the mediastinum. Shallow inspiration with atelectasis in the lung bases. Cardiac enlargement without vascular congestion or edema. No focal consolidation. No pneumothorax. Calcification of the aorta. Severe degenerative changes in the shoulders with resection or resorption of the distal left clavicle. Scattered gas and stool throughout the colon. No small or large bowel distention. No free intra-abdominal air. No abnormal air-fluid levels. Degenerative changes in the spine and hips. IMPRESSION: Shallow inspiration with atelectasis in the lung bases. Cardiac enlargement. Aortic atherosclerosis. Nonobstructive bowel gas pattern with stool-filled colon. Electronically Signed   By: Burman Nieves M.D.   On: 12/02/2017 06:17   Mr Abdomen Mrcp Vivien Rossetti Contast  Result Date: 12/02/2017 CLINICAL DATA:  Upper abdominal pain. Abnormal liver function tests. Dilated common bile duct on ultrasound. Acute pancreatitis on CT scan. EXAM: MRI ABDOMEN WITHOUT AND WITH CONTRAST (INCLUDING MRCP) TECHNIQUE: Multiplanar multisequence MR imaging of the abdomen was performed both before and after the administration of intravenous contrast. Heavily T2-weighted images of the biliary and pancreatic ducts were obtained, and three-dimensional MRCP images were rendered by post processing. CONTRAST:  18mL MULTIHANCE GADOBENATE DIMEGLUMINE 529 MG/ML IV SOLN COMPARISON:  12/02/2017 CT scan FINDINGS: Despite efforts by the technologist and patient, motion artifact is present on today's exam and could not be eliminated. This is a common result when MRCP is attempted in the inpatient setting where patients are less likely to be able to breath hold and cooperate in controlling motion. This reduces exam  sensitivity and specificity. Lower chest: Stable cardiomegaly. Aortic valve prosthesis. Mild atelectasis in the posterior basal segments of both lower lobes. Hepatobiliary: Cholelithiasis is observed. On images 45 through 50 of series 5, there is low signal intensity filling defect in the distal CBD compatible with small stones in the setting of choledocholithiasis. This is a likely cause for the biliary dilatation and pancreatitis. There appear to be multiple clustered calculi in the 5 mm in less range in the vicinity of the ampulla. The filling defects are also shown on image 23/4 and images 29 through 31 of series 24. Mild extrahepatic biliary dilatation, with the common bile duct at about 9 mm. Pancreas: Peripancreatic edema compatible with acute pancreatitis. No abscess, pseudocyst, or findings of pancreatic necrosis. Spleen:  Unremarkable Adrenals/Urinary Tract:  Unremarkable Stomach/Bowel: Mild secondary inflammation of the duodenum. Vascular/Lymphatic:  Aortoiliac atherosclerotic vascular disease. Other: Small amount of ascites along the inferior right hepatic margin. Musculoskeletal: Unremarkable IMPRESSION: 1. Choledocholithiasis with several clustered small (less than 5 mm) stones in the distal CBD. These at the likely cause of the mild biliary dilatation and acute pancreatitis. 2. Peripancreatic edema compatible with acute pancreatitis. No findings of pancreatic abscess, pseudocyst, or pancreatic necrosis. 3. Stable cardiomegaly with aortic valve prosthesis. 4. Mild subsegmental atelectasis in the posterior basal segments of both lower lobes. 5. Gallstones in the gallbladder. 6.  Aortic Atherosclerosis (ICD10-I70.0). Electronically Signed   By: Gaylyn Rong M.D.   On: 12/02/2017 15:13   US Abdomen Limited Ruq  Result Date: 12/02/2017 CLINICAL DATA:  Acute cholelithiasis, pancreatitis. EXAM: ULTRASOUND ABDOMEN LIMITED RIGHT UPPER QUADRANT COMPARISON:  CT of the abdomen and pelvis from the same  day. FINDINGS: Gallbladder: Sludge is present within the gallbladder. No visible stones are present. Wall is thickened, measuring up to 7 mm. There is no sonographic Murphy sign. Common bile duct: Diameter: The common bile duct  is dilated measuring up to 11.6 mm. Liver: Liver is diffusely echogenic. No discrete lesions are present. Portal vein is patent on color Doppler imaging with normal direction of blood flow towards the liver. IMPRESSION: 1. Dilated common bile duct likely associated with the acute pancreatitis. 2. Gallbladder wall thickening and sludge may also be associated with acute pancreatitis. 3. No gallstones or sonographic Murphy sign to suggest primary gallbladder pathology. Electronically Signed   By: Marin Robertshristopher  Mattern M.D.   On: 12/02/2017 11:27    Assessment: 72 year old male admitted with biliary pancreatitis secondary to choledocholithiasis, on chronic anticoagulation (Coumadin) prior to admission due to history of afib. 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   INR is 1.98 today. Last dose of Coumadin was Monday evening. Discussed with Dr. Jena Gaussourk and Dr. Wyline MoodBranch. Will give Vit K IV X 1 now. Reschedule ERCP for 1/24 and hold on heparin drip until needed post-procedure for bridging when acceptable from procedure standpoint. May hold on heparin drip for now pre-procedure as this is tentatively planned for tomorrow. However, if procedure is postponed past tomorrow, would need heparin drip to be started. Will check INR tomorrow morning. Discussed plan for ERCP on 1/24 with patient and wife including risks and benefits   Abdominal pain improving. Will allow sips of clear liquids for now.   Plan: Clearance CootsSips of clears Vit K IV X 1 now Hold on heparin drip unless procedure is postponed past 1/24 ERCP  tentatively planned for 1/24 Repeat INR tomorrow Will check lipase now, and CBC, CMP, INR, lipase tomorrow morning NPO after midnight   Gelene MinkAnna W. Boone, PhD, ANP-BC [facility]    LOS: 1 day    12/03/2017, 7:57 AM

## 2017-12-03 NOTE — Progress Notes (Signed)
Patient seen and examined. Abdominal pain is waning. He is not tachycardic. Agree with need for ERCP with sphincterotomy stone extraction.  Hopefully, anticoagulation status will be optimized for tomorrow.The risks, benefits, limitations, alternatives, and imponderable have been reviewed with the patient. I specifically discussed a1 in 10 chance of pancreatitis /exacerbating pre-existing pancreatitis, reaction to medications, bleeding, perforation and the possibility of a failed ERCP. Potential for sphincterotomy and stent placement also reviewed. Questions have been answered.  Patient and patient's wife agreeable.

## 2017-12-03 NOTE — Progress Notes (Addendum)
[facility] Progress Note  Day of Surgery  Subjective: Pain improved. Tolerating some water. Feels less nauseated. INR 1.98, lipase trending down. Cardiology has seen the patient and deems him acceptable risk and wants to get Heparin gtt to bridge due to the history of atrial thrombus.   Objective: Vital signs in last 24 hours: Temp:  [98.1 F (36.7 C)-99.6 F (37.6 C)] 98.1 F (36.7 C) (01/23 0416) Pulse Rate:  [89-102] 96 (01/23 0416) Resp:  [12-20] 20 (01/23 0416) BP: (94-113)/(71-84) 94/71 (01/23 0416) SpO2:  [93 %-99 %] 93 % (01/23 0416) Last BM Date: 12/01/17  Intake/Output from previous day: 01/22 0701 - 01/23 0700 In: 1538.8 [I.V.:488.8; IV Piggyback:1050] Out: -  Intake/Output this shift: No intake/output data recorded.  General appearance: alert, cooperative and no distress Resp: normal work breathing GI: soft, distended, slightly tender upper quadrants  Lab Results:  Recent Labs    12/02/17 0517 12/03/17 0435  WBC 8.4 8.1  HGB 13.2 11.1*  HCT 39.0 34.6*  PLT 221 169   BMET Recent Labs    12/02/17 0517 12/03/17 0435  NA 135 139  K 3.5 4.8  CL 101 106  CO2 21* 22  GLUCOSE 161* 111*  BUN 20 24*  CREATININE 1.09 1.39*  CALCIUM 9.1 7.9*   PT/INR Recent Labs    12/02/17 0517 12/03/17 0435  LABPROT 23.7* 22.4*  INR 2.14 1.98    Studies/Results: Ct Abdomen Pelvis W Contrast  Result Date: 12/02/2017 CLINICAL DATA:  Altered bowel habits. Treatment for diarrhea has resulted in no bowel movement for 2 days. Increasing abdominal pain. EXAM: CT ABDOMEN AND PELVIS WITH CONTRAST TECHNIQUE: Multidetector CT imaging of the abdomen and pelvis was performed using the standard protocol following bolus administration of intravenous contrast. CONTRAST:  100mL ISOVUE-300 IOPAMIDOL (ISOVUE-300) INJECTION 61% COMPARISON:  Multiple exams, including CT scan from 07/23/2012 FINDINGS: Lower chest: Moderate cardiomegaly. Aortic valve prosthesis.  Posterior wall calcifications in the left atrium and to a lesser extent in the right atrium. Thinning of the left ventricular myocardium. Mild atelectasis in the lingula and mild dependent atelectasis in both lower lobes. Circumflex coronary artery atherosclerotic calcification. Hepatobiliary: Mild intrahepatic biliary dilatation. Common hepatic duct 1.6 cm, formerly 0.8 cm on 07/23/2012. Common bile duct 1.2 cm, formerly 0.8 cm. This CBD dilatation extends to the ampulla Dependent densities in the gallbladder are compatible with gallstones. Borderline wall thickening in portions of the gallbladder. Pancreas: Abnormal peripancreatic stranding diffusely without pseudocyst, abscess, or necrosis. Spleen: Unremarkable Adrenals/Urinary Tract: Stable fullness of the right adrenal gland. Left adrenal gland unremarkable. 0.7 by 1.4 cm hypodense lesion of the left kidney upper pole is likely a small cyst but technically too small to characterize. Stomach/Bowel: Mild wall thickening and accentuated enhancement in the duodenal bulb, descending duodenum, and proximal transverse duodenum noted, secondary inflammation is favored over peptic ulcer disease. Several small diverticula of the transverse duodenum do not appear to be the epicenter of the regional inflammatory process. Descending and sigmoid colon diverticulosis without active diverticulitis. Scattered diverticula of the ascending colon noted. Appendix normal. Vascular/Lymphatic: Aortoiliac atherosclerotic vascular disease. Reproductive: Unremarkable Other: Small amount of free fluid adjacent to the descending duodenum and tracking in the right paracolic gutter. Small amount of central mesenteric edema. Musculoskeletal: Old healed right lower lateral rib fractures. Subacute healing fracture of the left anterior fifth and sixth ribs. Prior median sternotomy. Mildly exaggerated lumbar lordosis. Grade 1 degenerative anterolisthesis at L5-S1 with slightly transitional S1  level. Lumbar spondylosis and degenerative disc  disease with mild impingement at L4-5 and L5-S1. IMPRESSION: 1. Acute pancreatitis with peripancreatic stranding and a small amount of ascites tracking in the right paracolic gutter. No findings of pancreatic abscess, necrosis, or pseudocyst. 2. Gallstones are present in the gallbladder and there is new intrahepatic and extrahepatic biliary dilatation, raising the strong possibility of occult distal CBD stone as a potential cause for the pancreatitis and biliary dilatation. Minimal gallbladder wall thickening. 3. Subacute healing fractures of the left anterior fifth and sixth ribs. 4. Cardiomegaly with wall calcifications along the atria and thinning of the left ventricular myocardium. Coronary atherosclerosis. Scattered colonic diverticulosis diverticula of the transverse duodenum. There are also some. 5.  Aortic Atherosclerosis (ICD10-I70.0). 6. Mild lumbar impingement at L4-5 and L5-S1. Electronically Signed   By: Gaylyn Rong M.D.   On: 12/02/2017 07:48   Mr 3d Recon At Scanner  Result Date: 12/02/2017 CLINICAL DATA:  Upper abdominal pain. Abnormal liver function tests. Dilated common bile duct on ultrasound. Acute pancreatitis on CT scan. EXAM: MRI ABDOMEN WITHOUT AND WITH CONTRAST (INCLUDING MRCP) TECHNIQUE: Multiplanar multisequence MR imaging of the abdomen was performed both before and after the administration of intravenous contrast. Heavily T2-weighted images of the biliary and pancreatic ducts were obtained, and three-dimensional MRCP images were rendered by post processing. CONTRAST:  18mL MULTIHANCE GADOBENATE DIMEGLUMINE 529 MG/ML IV SOLN COMPARISON:  12/02/2017 CT scan FINDINGS: Despite efforts by the technologist and patient, motion artifact is present on today's exam and could not be eliminated. This is a common result when MRCP is attempted in the inpatient setting where patients are less likely to be able to breath hold and cooperate in  controlling motion. This reduces exam sensitivity and specificity. Lower chest: Stable cardiomegaly. Aortic valve prosthesis. Mild atelectasis in the posterior basal segments of both lower lobes. Hepatobiliary: Cholelithiasis is observed. On images 45 through 50 of series 5, there is low signal intensity filling defect in the distal CBD compatible with small stones in the setting of choledocholithiasis. This is a likely cause for the biliary dilatation and pancreatitis. There appear to be multiple clustered calculi in the 5 mm in less range in the vicinity of the ampulla. The filling defects are also shown on image 23/4 and images 29 through 31 of series 24. Mild extrahepatic biliary dilatation, with the common bile duct at about 9 mm. Pancreas: Peripancreatic edema compatible with acute pancreatitis. No abscess, pseudocyst, or findings of pancreatic necrosis. Spleen:  Unremarkable Adrenals/Urinary Tract:  Unremarkable Stomach/Bowel: Mild secondary inflammation of the duodenum. Vascular/Lymphatic:  Aortoiliac atherosclerotic vascular disease. Other: Small amount of ascites along the inferior right hepatic margin. Musculoskeletal: Unremarkable IMPRESSION: 1. Choledocholithiasis with several clustered small (less than 5 mm) stones in the distal CBD. These at the likely cause of the mild biliary dilatation and acute pancreatitis. 2. Peripancreatic edema compatible with acute pancreatitis. No findings of pancreatic abscess, pseudocyst, or pancreatic necrosis. 3. Stable cardiomegaly with aortic valve prosthesis. 4. Mild subsegmental atelectasis in the posterior basal segments of both lower lobes. 5. Gallstones in the gallbladder. 6.  Aortic Atherosclerosis (ICD10-I70.0). Electronically Signed   By: Gaylyn Rong M.D.   On: 12/02/2017 15:13   Dg Abdomen Acute W/chest  Result Date: 12/02/2017 CLINICAL DATA:  Abdominal pain since this morning. EXAM: DG ABDOMEN ACUTE W/ 1V CHEST COMPARISON:  Chest 02/05/2017  FINDINGS: Postoperative changes in the mediastinum. Shallow inspiration with atelectasis in the lung bases. Cardiac enlargement without vascular congestion or edema. No focal consolidation. No pneumothorax. Calcification of the  aorta. Severe degenerative changes in the shoulders with resection or resorption of the distal left clavicle. Scattered gas and stool throughout the colon. No small or large bowel distention. No free intra-abdominal air. No abnormal air-fluid levels. Degenerative changes in the spine and hips. IMPRESSION: Shallow inspiration with atelectasis in the lung bases. Cardiac enlargement. Aortic atherosclerosis. Nonobstructive bowel gas pattern with stool-filled colon. Electronically Signed   By: Burman Nieves M.D.   On: 12/02/2017 06:17   Mr Abdomen Mrcp Vivien Rossetti Contast  Result Date: 12/02/2017 CLINICAL DATA:  Upper abdominal pain. Abnormal liver function tests. Dilated common bile duct on ultrasound. Acute pancreatitis on CT scan. EXAM: MRI ABDOMEN WITHOUT AND WITH CONTRAST (INCLUDING MRCP) TECHNIQUE: Multiplanar multisequence MR imaging of the abdomen was performed both before and after the administration of intravenous contrast. Heavily T2-weighted images of the biliary and pancreatic ducts were obtained, and three-dimensional MRCP images were rendered by post processing. CONTRAST:  18mL MULTIHANCE GADOBENATE DIMEGLUMINE 529 MG/ML IV SOLN COMPARISON:  12/02/2017 CT scan FINDINGS: Despite efforts by the technologist and patient, motion artifact is present on today's exam and could not be eliminated. This is a common result when MRCP is attempted in the inpatient setting where patients are less likely to be able to breath hold and cooperate in controlling motion. This reduces exam sensitivity and specificity. Lower chest: Stable cardiomegaly. Aortic valve prosthesis. Mild atelectasis in the posterior basal segments of both lower lobes. Hepatobiliary: Cholelithiasis is observed. On images 45  through 50 of series 5, there is low signal intensity filling defect in the distal CBD compatible with small stones in the setting of choledocholithiasis. This is a likely cause for the biliary dilatation and pancreatitis. There appear to be multiple clustered calculi in the 5 mm in less range in the vicinity of the ampulla. The filling defects are also shown on image 23/4 and images 29 through 31 of series 24. Mild extrahepatic biliary dilatation, with the common bile duct at about 9 mm. Pancreas: Peripancreatic edema compatible with acute pancreatitis. No abscess, pseudocyst, or findings of pancreatic necrosis. Spleen:  Unremarkable Adrenals/Urinary Tract:  Unremarkable Stomach/Bowel: Mild secondary inflammation of the duodenum. Vascular/Lymphatic:  Aortoiliac atherosclerotic vascular disease. Other: Small amount of ascites along the inferior right hepatic margin. Musculoskeletal: Unremarkable IMPRESSION: 1. Choledocholithiasis with several clustered small (less than 5 mm) stones in the distal CBD. These at the likely cause of the mild biliary dilatation and acute pancreatitis. 2. Peripancreatic edema compatible with acute pancreatitis. No findings of pancreatic abscess, pseudocyst, or pancreatic necrosis. 3. Stable cardiomegaly with aortic valve prosthesis. 4. Mild subsegmental atelectasis in the posterior basal segments of both lower lobes. 5. Gallstones in the gallbladder. 6.  Aortic Atherosclerosis (ICD10-I70.0). Electronically Signed   By: Gaylyn Rong M.D.   On: 12/02/2017 15:13   US Abdomen Limited Ruq  Result Date: 12/02/2017 CLINICAL DATA:  Acute cholelithiasis, pancreatitis. EXAM: ULTRASOUND ABDOMEN LIMITED RIGHT UPPER QUADRANT COMPARISON:  CT of the abdomen and pelvis from the same day. FINDINGS: Gallbladder: Sludge is present within the gallbladder. No visible stones are present. Wall is thickened, measuring up to 7 mm. There is no sonographic Murphy sign. Common bile duct: Diameter: The  common bile duct is dilated measuring up to 11.6 mm. Liver: Liver is diffusely echogenic. No discrete lesions are present. Portal vein is patent on color Doppler imaging with normal direction of blood flow towards the liver. IMPRESSION: 1. Dilated common bile duct likely associated with the acute pancreatitis. 2. Gallbladder wall thickening and  sludge may also be associated with acute pancreatitis. 3. No gallstones or sonographic Murphy sign to suggest primary gallbladder pathology. Electronically Signed   By: Marin Roberts M.D.   On: 12/02/2017 11:27    Anti-infectives: Anti-infectives (From admission, onward)   Start     Dose/Rate Route Frequency Ordered Stop   12/02/17 1000  piperacillin-tazobactam (ZOSYN) IVPB 3.375 g     3.375 g 12.5 mL/hr over 240 Minutes Intravenous Every 8 hours 12/02/17 0947     12/02/17 0945  piperacillin-tazobactam (ZOSYN) IVPB 3.375 g  Status:  Discontinued     3.375 g 100 mL/hr over 30 Minutes Intravenous Every 8 hours 12/02/17 0941 12/02/17 0946      Assessment/Plan: Patient is a 72 yo male with a cardiac history including aortic stenosis s/p AVR replacement, history A fib, and atrial thrombus causing MI requiring coumadin. Bridging to heparin now.  -Can have liquids as tolerated but would hold if pain worsening -Trend labs, LFTs, INR -GI will do ERCP first, and per the patient GI will perform at 1.8 INR -Discussed with the patient that my procedure usually occurs 1-2 days after the ERCP, and that pending his INR that he might be here over the weekend to get the lap cholecystectomy on Monday  -Heparin gtt will need to be held 6 hours before OR    LOS: 1 day    Lucretia Roers 12/03/2017

## 2017-12-03 NOTE — Progress Notes (Signed)
PROGRESS NOTE    [name]  ZOX:[id number] DOB: 05-19-1946 DOA: 12/02/2017 PCP: Benita Stabile, MD     Brief Narrative:  72 year old man admitted from home on 1/22 due to abdominal pain and emesis.  He was found to have a lipase of over 3000 with elevated LFTs.  CT of the abdomen and pelvis showed cholelithiasis with gallbladder wall thickening with dry and extrahepatic ductal dilatation as well as common bile duct dilatation.  Admission requested, GI and surgical consults were requested.  Patient's procedures have been delayed due to iatrogenic coagulopathy as patient is anticoagulated on Coumadin for A. fib.  Has been seen by cardiology who has cleared patient for procedures.   Assessment & Plan:   Active Problems:   Preoperative cardiovascular examination   Gallstone pancreatitis   Choledocholithiasis   Atrial fibrillation, chronic (HCC)   S/P AVR   Gallstone pancreatitis/choledocholithiasis/cholelithiasis -Patient still with some abdominal pain, tolerating clear liquids. -ERCP has been delayed due to coagulopathy, INR still 1.98 today. -Vitamin K was given this morning and another dose has been requested for this afternoon. -Lipase is down to 1300, LFTs improving as well. -Continue Zosyn given cholelithiasis, plan for cholecystectomy this admission after ERCP.  Chronic atrial fibrillation -Rate is currently controlled -Warfarin remains on hold in anticipation of ERCP and surgery. -Has been given 2 doses of vitamin K for INR reversal. -We will likely need to start heparin drip soon if INR continues to drift downwards given cardiology recommendations.  History of coronary artery disease -No chest pain at present, stable  Hypertension -Well-controlled  History of bioprosthetic aortic valve replacement   DVT prophylaxis: Coumadin, heparin bridge once INR subtherapeutic Code Status: Full code Family Communication: Patient only Disposition Plan: Pending ERCP/  Cholecystectomy  Consultants:   GI  Surgery  Procedures:   None  Antimicrobials:  Anti-infectives (From admission, onward)   Start     Dose/Rate Route Frequency Ordered Stop   12/02/17 1000  piperacillin-tazobactam (ZOSYN) IVPB 3.375 g     3.375 g 12.5 mL/hr over 240 Minutes Intravenous Every 8 hours 12/02/17 0947     12/02/17 0945  piperacillin-tazobactam (ZOSYN) IVPB 3.375 g  Status:  Discontinued     3.375 g 100 mL/hr over 30 Minutes Intravenous Every 8 hours 12/02/17 0941 12/02/17 0946       Subjective: Less abdominal pain, is hungry  Objective: Vitals:   12/02/17 1816 12/02/17 2100 12/03/17 0416 12/03/17 1414  BP: 106/74 112/84 94/71 (!) 108/40  Pulse: 95 (!) 102 96 (!) 108  Resp: 12 19 20 20   Temp: 98.8 F (37.1 C) 99.6 F (37.6 C) 98.1 F (36.7 C) 98.3 F (36.8 C)  TempSrc: Oral Oral Oral   SpO2: 93% 94% 93% 100%  Weight:      Height:       No intake or output data in the 24 hours ending 12/03/17 1655 Filed Weights   12/02/17 0459 12/02/17 0923  Weight: 88 kg (194 lb) 88 kg (194 lb 0.1 oz)    Examination:  General exam: Alert, awake, oriented x 3 Respiratory system: Clear to auscultation. Respiratory effort normal. Cardiovascular system:RRR. No murmurs, rubs, gallops. Gastrointestinal system: Abdomen is distended, soft and tender to palpation to the right upper quadrant and epigastric area. No organomegaly or masses felt. Normal bowel sounds heard. Central nervous system: Alert and oriented. No focal neurological deficits. Extremities: No C/C/E, +pedal pulses Skin: No rashes, lesions or ulcers Psychiatry: Judgement and insight appear normal. Mood & affect  appropriate.     Data Reviewed: I have personally reviewed following labs and imaging studies  CBC: Recent Labs  Lab 12/02/17 0517 12/03/17 0435  WBC 8.4 8.1  NEUTROABS 7.7  --   HGB 13.2 11.1*  HCT 39.0 34.6*  MCV 101.3* 105.8*  PLT 221 169   Basic Metabolic Panel: Recent Labs    Lab 12/02/17 0517 12/03/17 0435  NA 135 139  K 3.5 4.8  CL 101 106  CO2 21* 22  GLUCOSE 161* 111*  BUN 20 24*  CREATININE 1.09 1.39*  CALCIUM 9.1 7.9*   GFR: Estimated Creatinine Clearance: 53.7 mL/min (A) (by C-G formula based on SCr of 1.39 mg/dL (H)). Liver Function Tests: Recent Labs  Lab 12/02/17 0517 12/03/17 0435  AST 250* 84*  ALT 86* 67*  ALKPHOS 277* 148*  BILITOT 1.3* 1.5*  PROT 7.4 6.5  ALBUMIN 3.5 3.0*   Recent Labs  Lab 12/02/17 0517 12/03/17 0900  LIPASE 3,298* 1,375*   No results for input(s): AMMONIA in the last 168 hours. Coagulation Profile: Recent Labs  Lab 12/02/17 0517 12/03/17 0435  INR 2.14 1.98   Cardiac Enzymes: No results for input(s): CKTOTAL, CKMB, CKMBINDEX, TROPONINI in the last 168 hours. BNP (last 3 results) No results for input(s): PROBNP in the last 8760 hours. HbA1C: Recent Labs    12/02/17 1000  HGBA1C 5.7*   CBG: No results for input(s): GLUCAP in the last 168 hours. Lipid Profile: Recent Labs    12/02/17 1000  CHOL 131  HDL 62  LDLCALC 51  TRIG 92  CHOLHDL 2.1   Thyroid Function Tests: No results for input(s): TSH, T4TOTAL, FREET4, T3FREE, THYROIDAB in the last 72 hours. Anemia Panel: No results for input(s): VITAMINB12, FOLATE, FERRITIN, TIBC, IRON, RETICCTPCT in the last 72 hours. Urine analysis:    Component Value Date/Time   COLORURINE YELLOW 12/02/2017 2335   APPEARANCEUR CLEAR 12/02/2017 2335   LABSPEC 1.036 (H) 12/02/2017 2335   PHURINE 5.0 12/02/2017 2335   GLUCOSEU NEGATIVE 12/02/2017 2335   HGBUR SMALL (A) 12/02/2017 2335   BILIRUBINUR NEGATIVE 12/02/2017 2335   KETONESUR NEGATIVE 12/02/2017 2335   PROTEINUR NEGATIVE 12/02/2017 2335   UROBILINOGEN 1.0 07/14/2012 1243   NITRITE NEGATIVE 12/02/2017 2335   LEUKOCYTESUR NEGATIVE 12/02/2017 2335   Sepsis Labs: @LABRCNTIP (procalcitonin:4,lacticidven:4)  )No results found for this or any previous visit (from the past 240 hour(s)).        Radiology Studies: Ct Abdomen Pelvis W Contrast  Result Date: 12/02/2017 CLINICAL DATA:  Altered bowel habits. Treatment for diarrhea has resulted in no bowel movement for 2 days. Increasing abdominal pain. EXAM: CT ABDOMEN AND PELVIS WITH CONTRAST TECHNIQUE: Multidetector CT imaging of the abdomen and pelvis was performed using the standard protocol following bolus administration of intravenous contrast. CONTRAST:  ISOVUE-300 IOPAMIDOL (ISOVUE-300) INJECTION 61% COMPARISON:  Multiple exams, including CT scan from 07/23/2012 FINDINGS: Lower chest: Moderate cardiomegaly. Aortic valve prosthesis. Posterior wall calcifications in the left atrium and to a lesser extent in the right atrium. Thinning of the left ventricular myocardium. Mild atelectasis in the lingula and mild dependent atelectasis in both lower lobes. Circumflex coronary artery atherosclerotic calcification. Hepatobiliary: Mild intrahepatic biliary dilatation. Common hepatic duct 1.6 cm, formerly 0.8 cm on 07/23/2012. Common bile duct 1.2 cm, formerly 0.8 cm. This CBD dilatation extends to the ampulla Dependent densities in the gallbladder are compatible with gallstones. Borderline wall thickening in portions of the gallbladder. Pancreas: Abnormal peripancreatic stranding diffusely without pseudocyst, abscess, or necrosis. Spleen:  Unremarkable Adrenals/Urinary Tract: Stable fullness of the right adrenal gland. Left adrenal gland unremarkable. 0.7 by 1.4 cm hypodense lesion of the left kidney upper pole is likely a small cyst but technically too small to characterize. Stomach/Bowel: Mild wall thickening and accentuated enhancement in the duodenal bulb, descending duodenum, and proximal transverse duodenum noted, secondary inflammation is favored over peptic ulcer disease. Several small diverticula of the transverse duodenum do not appear to be the epicenter of the regional inflammatory process. Descending and sigmoid colon  diverticulosis without active diverticulitis. Scattered diverticula of the ascending colon noted. Appendix normal. Vascular/Lymphatic: Aortoiliac atherosclerotic vascular disease. Reproductive: Unremarkable Other: Small amount of free fluid adjacent to the descending duodenum and tracking in the right paracolic gutter. Small amount of central mesenteric edema. Musculoskeletal: Old healed right lower lateral rib fractures. Subacute healing fracture of the left anterior fifth and sixth ribs. Prior median sternotomy. Mildly exaggerated lumbar lordosis. Grade 1 degenerative anterolisthesis at L5-S1 with slightly transitional S1 level. Lumbar spondylosis and degenerative disc disease with mild impingement at L4-5 and L5-S1. IMPRESSION: 1. Acute pancreatitis with peripancreatic stranding and a small amount of ascites tracking in the right paracolic gutter. No findings of pancreatic abscess, necrosis, or pseudocyst. 2. Gallstones are present in the gallbladder and there is new intrahepatic and extrahepatic biliary dilatation, raising the strong possibility of occult distal CBD stone as a potential cause for the pancreatitis and biliary dilatation. Minimal gallbladder wall thickening. 3. Subacute healing fractures of the left anterior fifth and sixth ribs. 4. Cardiomegaly with wall calcifications along the atria and thinning of the left ventricular myocardium. Coronary atherosclerosis. Scattered colonic diverticulosis diverticula of the transverse duodenum. There are also some. 5.  Aortic Atherosclerosis (ICD10-I70.0). 6. Mild lumbar impingement at L4-5 and L5-S1. Electronically Signed   By: Gaylyn RongWalter  Liebkemann M.D.   On: 12/02/2017 07:48   Mr 3d Recon At Scanner  Result Date: 12/02/2017 CLINICAL DATA:  Upper abdominal pain. Abnormal liver function tests. Dilated common bile duct on ultrasound. Acute pancreatitis on CT scan. EXAM: MRI ABDOMEN WITHOUT AND WITH CONTRAST (INCLUDING MRCP) TECHNIQUE: Multiplanar multisequence  MR imaging of the abdomen was performed both before and after the administration of intravenous contrast. Heavily T2-weighted images of the biliary and pancreatic ducts were obtained, and three-dimensional MRCP images were rendered by post processing. CONTRAST:  18mL MULTIHANCE GADOBENATE DIMEGLUMINE 529 MG/ML IV SOLN COMPARISON:  12/02/2017 CT scan FINDINGS: Despite efforts by the technologist and patient, motion artifact is present on today's exam and could not be eliminated. This is a common result when MRCP is attempted in the inpatient setting where patients are less likely to be able to breath hold and cooperate in controlling motion. This reduces exam sensitivity and specificity. Lower chest: Stable cardiomegaly. Aortic valve prosthesis. Mild atelectasis in the posterior basal segments of both lower lobes. Hepatobiliary: Cholelithiasis is observed. On images 45 through 50 of series 5, there is low signal intensity filling defect in the distal CBD compatible with small stones in the setting of choledocholithiasis. This is a likely cause for the biliary dilatation and pancreatitis. There appear to be multiple clustered calculi in the 5 mm in less range in the vicinity of the ampulla. The filling defects are also shown on image 23/4 and images 29 through 31 of series 24. Mild extrahepatic biliary dilatation, with the common bile duct at about 9 mm. Pancreas: Peripancreatic edema compatible with acute pancreatitis. No abscess, pseudocyst, or findings of pancreatic necrosis. Spleen:  Unremarkable Adrenals/Urinary Tract:  Unremarkable Stomach/Bowel:  Mild secondary inflammation of the duodenum. Vascular/Lymphatic:  Aortoiliac atherosclerotic vascular disease. Other: Small amount of ascites along the inferior right hepatic margin. Musculoskeletal: Unremarkable IMPRESSION: 1. Choledocholithiasis with several clustered small (less than 5 mm) stones in the distal CBD. These at the likely cause of the mild biliary  dilatation and acute pancreatitis. 2. Peripancreatic edema compatible with acute pancreatitis. No findings of pancreatic abscess, pseudocyst, or pancreatic necrosis. 3. Stable cardiomegaly with aortic valve prosthesis. 4. Mild subsegmental atelectasis in the posterior basal segments of both lower lobes. 5. Gallstones in the gallbladder. 6.  Aortic Atherosclerosis (ICD10-I70.0). Electronically Signed   By: Gaylyn Rong M.D.   On: 12/02/2017 15:13   Dg Abdomen Acute W/chest  Result Date: 12/02/2017 CLINICAL DATA:  Abdominal pain since this morning. EXAM: DG ABDOMEN ACUTE W/ 1V CHEST COMPARISON:  Chest 02/05/2017 FINDINGS: Postoperative changes in the mediastinum. Shallow inspiration with atelectasis in the lung bases. Cardiac enlargement without vascular congestion or edema. No focal consolidation. No pneumothorax. Calcification of the aorta. Severe degenerative changes in the shoulders with resection or resorption of the distal left clavicle. Scattered gas and stool throughout the colon. No small or large bowel distention. No free intra-abdominal air. No abnormal air-fluid levels. Degenerative changes in the spine and hips. IMPRESSION: Shallow inspiration with atelectasis in the lung bases. Cardiac enlargement. Aortic atherosclerosis. Nonobstructive bowel gas pattern with stool-filled colon. Electronically Signed   By: Burman Nieves M.D.   On: 12/02/2017 06:17   Mr Abdomen Mrcp Vivien Rossetti Contast  Result Date: 12/02/2017 CLINICAL DATA:  Upper abdominal pain. Abnormal liver function tests. Dilated common bile duct on ultrasound. Acute pancreatitis on CT scan. EXAM: MRI ABDOMEN WITHOUT AND WITH CONTRAST (INCLUDING MRCP) TECHNIQUE: Multiplanar multisequence MR imaging of the abdomen was performed both before and after the administration of intravenous contrast. Heavily T2-weighted images of the biliary and pancreatic ducts were obtained, and three-dimensional MRCP images were rendered by post processing.  CONTRAST:  18mL MULTIHANCE GADOBENATE DIMEGLUMINE 529 MG/ML IV SOLN COMPARISON:  12/02/2017 CT scan FINDINGS: Despite efforts by the technologist and patient, motion artifact is present on today's exam and could not be eliminated. This is a common result when MRCP is attempted in the inpatient setting where patients are less likely to be able to breath hold and cooperate in controlling motion. This reduces exam sensitivity and specificity. Lower chest: Stable cardiomegaly. Aortic valve prosthesis. Mild atelectasis in the posterior basal segments of both lower lobes. Hepatobiliary: Cholelithiasis is observed. On images 45 through 50 of series 5, there is low signal intensity filling defect in the distal CBD compatible with small stones in the setting of choledocholithiasis. This is a likely cause for the biliary dilatation and pancreatitis. There appear to be multiple clustered calculi in the 5 mm in less range in the vicinity of the ampulla. The filling defects are also shown on image 23/4 and images 29 through 31 of series 24. Mild extrahepatic biliary dilatation, with the common bile duct at about 9 mm. Pancreas: Peripancreatic edema compatible with acute pancreatitis. No abscess, pseudocyst, or findings of pancreatic necrosis. Spleen:  Unremarkable Adrenals/Urinary Tract:  Unremarkable Stomach/Bowel: Mild secondary inflammation of the duodenum. Vascular/Lymphatic:  Aortoiliac atherosclerotic vascular disease. Other: Small amount of ascites along the inferior right hepatic margin. Musculoskeletal: Unremarkable IMPRESSION: 1. Choledocholithiasis with several clustered small (less than 5 mm) stones in the distal CBD. These at the likely cause of the mild biliary dilatation and acute pancreatitis. 2. Peripancreatic edema compatible with acute pancreatitis. No findings  of pancreatic abscess, pseudocyst, or pancreatic necrosis. 3. Stable cardiomegaly with aortic valve prosthesis. 4. Mild subsegmental atelectasis in the  posterior basal segments of both lower lobes. 5. Gallstones in the gallbladder. 6.  Aortic Atherosclerosis (ICD10-I70.0). Electronically Signed   By: Gaylyn Rong M.D.   On: 12/02/2017 15:13   US Abdomen Limited Ruq  Result Date: 12/02/2017 CLINICAL DATA:  Acute cholelithiasis, pancreatitis. EXAM: ULTRASOUND ABDOMEN LIMITED RIGHT UPPER QUADRANT COMPARISON:  CT of the abdomen and pelvis from the same day. FINDINGS: Gallbladder: Sludge is present within the gallbladder. No visible stones are present. Wall is thickened, measuring up to 7 mm. There is no sonographic Murphy sign. Common bile duct: Diameter: The common bile duct is dilated measuring up to 11.6 mm. Liver: Liver is diffusely echogenic. No discrete lesions are present. Portal vein is patent on color Doppler imaging with normal direction of blood flow towards the liver. IMPRESSION: 1. Dilated common bile duct likely associated with the acute pancreatitis. 2. Gallbladder wall thickening and sludge may also be associated with acute pancreatitis. 3. No gallstones or sonographic Murphy sign to suggest primary gallbladder pathology. Electronically Signed   By: Marin Roberts M.D.   On: 12/02/2017 11:27        Scheduled Meds: . metoprolol tartrate  2.5 mg Intravenous Q6H   Continuous Infusions: . sodium chloride    . sodium chloride    . 0.9 % NaCl with KCl 20 mEq / L 75 mL/hr at 12/02/17 2331  . piperacillin-tazobactam (ZOSYN)  IV Stopped (12/03/17 1448)     LOS: 1 day    Time spent: 25 minutes. Greater than 50% of this time was spent in direct contact with the patient coordinating care.     Chaya Jan, MD [facility] Pager [phone]  If 7PM-7AM, please contact night-coverage [email or web address] Password [facility] 12/03/2017, 4:55 PM

## 2017-12-04 ENCOUNTER — Encounter (HOSPITAL_COMMUNITY): Payer: Self-pay | Admitting: *Deleted

## 2017-12-04 ENCOUNTER — Inpatient Hospital Stay (HOSPITAL_COMMUNITY): Payer: Medicare Other | Admitting: Anesthesiology

## 2017-12-04 ENCOUNTER — Inpatient Hospital Stay (HOSPITAL_COMMUNITY): Payer: Medicare Other

## 2017-12-04 ENCOUNTER — Encounter (HOSPITAL_COMMUNITY)
Admission: EM | Disposition: A | Discharge status: Home / Self Care | Payer: Self-pay | Source: Home / Self Care | Attending: Internal Medicine

## 2017-12-04 HISTORY — PX: SPHINCTEROTOMY: SHX5544

## 2017-12-04 HISTORY — PX: BALLOON DILATION: SHX5330

## 2017-12-04 HISTORY — PX: ERCP: SHX5425

## 2017-12-04 LAB — CBC WITH DIFFERENTIAL/PLATELET
Basophils Absolute: 0 10*3/uL (ref 0.0–0.1)
Basophils Relative: 0 %
EOS ABS: 0 10*3/uL (ref 0.0–0.7)
Eosinophils Relative: 1 %
HCT: 33.2 % — ABNORMAL LOW (ref 39.0–52.0)
HEMOGLOBIN: 10.7 g/dL — AB (ref 13.0–17.0)
LYMPHS ABS: 0.4 10*3/uL — AB (ref 0.7–4.0)
LYMPHS PCT: 7 %
MCH: 34.3 pg — AB (ref 26.0–34.0)
MCHC: 32.2 g/dL (ref 30.0–36.0)
MCV: 106.4 fL — AB (ref 78.0–100.0)
MONOS PCT: 9 %
Monocytes Absolute: 0.5 10*3/uL (ref 0.1–1.0)
NEUTROS PCT: 83 %
Neutro Abs: 5 10*3/uL (ref 1.7–7.7)
Platelets: 145 10*3/uL — ABNORMAL LOW (ref 150–400)
RBC: 3.12 MIL/uL — ABNORMAL LOW (ref 4.22–5.81)
RDW: 13.3 % (ref 11.5–15.5)
WBC: 6 10*3/uL (ref 4.0–10.5)

## 2017-12-04 LAB — COMPREHENSIVE METABOLIC PANEL
ALK PHOS: 147 U/L — AB (ref 38–126)
ALT: 66 U/L — AB (ref 17–63)
ANION GAP: 10 (ref 5–15)
AST: 110 U/L — ABNORMAL HIGH (ref 15–41)
Albumin: 2.9 g/dL — ABNORMAL LOW (ref 3.5–5.0)
BUN: 23 mg/dL — ABNORMAL HIGH (ref 6–20)
CALCIUM: 8 mg/dL — AB (ref 8.9–10.3)
CO2: 20 mmol/L — AB (ref 22–32)
CREATININE: 1.06 mg/dL (ref 0.61–1.24)
Chloride: 107 mmol/L (ref 101–111)
Glucose, Bld: 105 mg/dL — ABNORMAL HIGH (ref 65–99)
Potassium: 4.2 mmol/L (ref 3.5–5.1)
SODIUM: 137 mmol/L (ref 135–145)
TOTAL PROTEIN: 6.9 g/dL (ref 6.5–8.1)
Total Bilirubin: 4.3 mg/dL — ABNORMAL HIGH (ref 0.3–1.2)

## 2017-12-04 LAB — LIPASE, BLOOD: LIPASE: 329 U/L — AB (ref 11–51)

## 2017-12-04 LAB — MAGNESIUM: MAGNESIUM: 1.6 mg/dL — AB (ref 1.7–2.4)

## 2017-12-04 LAB — PROTIME-INR
INR: 1.3
Prothrombin Time: 16.1 seconds — ABNORMAL HIGH (ref 11.4–15.2)

## 2017-12-04 SURGERY — ERCP, WITH INTERVENTION IF INDICATED
Anesthesia: General | Site: Esophagus

## 2017-12-04 MED ORDER — ETOMIDATE 2 MG/ML IV SOLN
INTRAVENOUS | Status: DC | PRN
  Administered 2017-12-04: 10 mg via INTRAVENOUS

## 2017-12-04 MED ORDER — LACTATED RINGERS IV SOLN
INTRAVENOUS | Status: DC
  Administered 2017-12-04: 1000 mL via INTRAVENOUS

## 2017-12-04 MED ORDER — HEPARIN (PORCINE) IN NACL 100-0.45 UNIT/ML-% IJ SOLN
1250.0000 [IU]/h | INTRAMUSCULAR | Status: AC
  Administered 2017-12-04: 1250 [IU]/h via INTRAVENOUS
  Filled 2017-12-04: qty 250

## 2017-12-04 MED ORDER — LIDOCAINE HCL (PF) 1 % IJ SOLN
INTRAMUSCULAR | Status: AC
  Filled 2017-12-04: qty 5

## 2017-12-04 MED ORDER — ROCURONIUM BROMIDE 50 MG/5ML IV SOLN
INTRAVENOUS | Status: AC
  Filled 2017-12-04: qty 1

## 2017-12-04 MED ORDER — CHLORHEXIDINE GLUCONATE CLOTH 2 % EX PADS: 6.0000 | MEDICATED_PAD | Freq: Once | CUTANEOUS | Status: DC

## 2017-12-04 MED ORDER — ETOMIDATE 2 MG/ML IV SOLN
INTRAVENOUS | Status: AC
  Filled 2017-12-04: qty 10

## 2017-12-04 MED ORDER — SODIUM CHLORIDE 0.9 % IV SOLN: INTRAVENOUS | Status: DC

## 2017-12-04 MED ORDER — EPHEDRINE SULFATE 50 MG/ML IJ SOLN
INTRAMUSCULAR | Status: DC | PRN
  Administered 2017-12-04 (×2): 10 mg via INTRAVENOUS

## 2017-12-04 MED ORDER — FENTANYL CITRATE (PF) 100 MCG/2ML IJ SOLN
INTRAMUSCULAR | Status: DC | PRN
  Administered 2017-12-04 (×2): 25 ug via INTRAVENOUS

## 2017-12-04 MED ORDER — FENTANYL CITRATE (PF) 100 MCG/2ML IJ SOLN: 25.0000 ug | INTRAMUSCULAR | Status: DC | PRN

## 2017-12-04 MED ORDER — FENTANYL CITRATE (PF) 100 MCG/2ML IJ SOLN
INTRAMUSCULAR | Status: AC
  Filled 2017-12-04: qty 2

## 2017-12-04 MED ORDER — METOPROLOL TARTRATE 5 MG/5ML IV SOLN
INTRAVENOUS | Status: DC | PRN
  Administered 2017-12-04: 2 mg via INTRAVENOUS

## 2017-12-04 MED ORDER — MIDAZOLAM HCL 2 MG/2ML IJ SOLN
INTRAMUSCULAR | Status: AC
  Filled 2017-12-04: qty 2

## 2017-12-04 MED ORDER — LIDOCAINE HCL 1 % IJ SOLN
INTRAMUSCULAR | Status: DC | PRN
  Administered 2017-12-04: 25 mg via INTRADERMAL

## 2017-12-04 MED ORDER — METOPROLOL TARTRATE 50 MG PO TABS
50.0000 mg | ORAL_TABLET | Freq: Two times a day (BID) | ORAL | Status: DC
  Administered 2017-12-04 – 2017-12-06 (×4): 50 mg via ORAL
  Filled 2017-12-04 (×4): qty 1

## 2017-12-04 MED ORDER — DEXAMETHASONE SODIUM PHOSPHATE 4 MG/ML IJ SOLN
INTRAMUSCULAR | Status: AC
  Filled 2017-12-04: qty 1

## 2017-12-04 MED ORDER — IOPAMIDOL (ISOVUE-300) INJECTION 61%
INTRAVENOUS | Status: AC
  Filled 2017-12-04: qty 100

## 2017-12-04 MED ORDER — SUCCINYLCHOLINE CHLORIDE 20 MG/ML IJ SOLN
INTRAMUSCULAR | Status: DC | PRN
  Administered 2017-12-04: 140 mg via INTRAVENOUS

## 2017-12-04 MED ORDER — ONDANSETRON HCL 4 MG/2ML IJ SOLN
INTRAMUSCULAR | Status: AC
  Filled 2017-12-04: qty 2

## 2017-12-04 MED ORDER — CEFAZOLIN SODIUM-DEXTROSE 2-4 GM/100ML-% IV SOLN: 2.0000 g | INTRAVENOUS | Status: AC

## 2017-12-04 MED ORDER — DEXAMETHASONE SODIUM PHOSPHATE 4 MG/ML IJ SOLN
4.0000 mg | Freq: Once | INTRAMUSCULAR | Status: AC
  Administered 2017-12-04: 4 mg via INTRAVENOUS

## 2017-12-04 MED ORDER — ONDANSETRON HCL 4 MG/2ML IJ SOLN
4.0000 mg | Freq: Once | INTRAMUSCULAR | Status: AC
  Administered 2017-12-04: 4 mg via INTRAVENOUS

## 2017-12-04 MED ORDER — IOPAMIDOL (ISOVUE-300) INJECTION 61%
INTRAVENOUS | Status: DC | PRN
  Administered 2017-12-04: 50 mL via ORAL

## 2017-12-04 MED ORDER — GLUCAGON HCL RDNA (DIAGNOSTIC) 1 MG IJ SOLR
INTRAMUSCULAR | Status: AC
  Filled 2017-12-04: qty 2

## 2017-12-04 MED ORDER — MIDAZOLAM HCL 5 MG/5ML IJ SOLN
INTRAMUSCULAR | Status: DC | PRN
  Administered 2017-12-04: 1 mg via INTRAVENOUS

## 2017-12-04 MED ORDER — PROPOFOL 10 MG/ML IV BOLUS
INTRAVENOUS | Status: AC
  Filled 2017-12-04: qty 40

## 2017-12-04 MED ORDER — STERILE WATER FOR IRRIGATION IR SOLN
Status: DC | PRN
  Administered 2017-12-04: 1000 mL

## 2017-12-04 MED ORDER — MIDAZOLAM HCL 2 MG/2ML IJ SOLN
1.0000 mg | INTRAMUSCULAR | Status: DC
  Administered 2017-12-04: 2 mg via INTRAVENOUS

## 2017-12-04 NOTE — Op Note (Addendum)
[facility] Patient Name: [name] Procedure Date: 12/04/2017 10:33 AM MRN: [id number] Date of Birth: 06-Jan-1946 Attending MD: Gennette Pac , MD CSN: [id number] Age: 72 Admit Type: Inpatient Procedure:                ERCP Indications:              Choledocholithiasis/biliary pancreatitis Providers:                Gennette Pac, MD, Nena Polio, RN, Dyann Ruddle Referring MD:              Medicines:                General Anesthesia, zosyn IV Complications:            No immediate complications. Estimated Blood Loss:     Estimated blood loss was minimal. Procedure:                Pre-Anesthesia Assessment:                           - Prior to the procedure, a History and Physical                            was performed, and patient medications and                            allergies were reviewed. The patient's tolerance of                            previous anesthesia was also reviewed. The risks                            and benefits of the procedure and the sedation                            options and risks were discussed with the patient.                            All questions were answered, and informed consent                            was obtained. Prior Anticoagulants: The patient                            last took Coumadin (warfarin) 4 days prior to the                            procedure. ASA Grade Assessment: III - A patient                            with severe systemic disease. After reviewing the  risks and benefits, the patient was deemed in                            satisfactory condition to undergo the procedure.                           After obtaining informed consent, the scope was                            passed under direct vision. Throughout the                            procedure, the patient's blood pressure, pulse, and                            oxygen saturations  were monitored continuously. The                            [id number]) scope was introduced through                            the mouth, and used to inject contrast into and                            used to inject contrast into the bile duct. The                            ERCP was accomplished without difficulty. The                            patient tolerated the procedure well. Findings:      Scope was easily passed down to the second portion of the duodenum and       pulled back to the short position - 55 cm from the incisors. ampulla was       readily apparent. There is a small amount of bloody discharge coming       from the ampullary orifice. The scout film was normal. Utilizing the       Ultratome 44, deep access to the biliary tree achieved with guidewire       palpation.. A cholangiogram was performed which revealed no obvious       filling defects; bile duct not dilated. Slight irregularity at the       distal common bile duct. Sphinctertome railed down to the ampullary       orifice at the 12:00 position, utilizing Erbe unit, a 9 mm       sphincterotomy was performed without bleeding or other apparent       complication. With this maneuver, quite a bit of gravel and a 4 mm       cholesterol stone was delivered. The ampulla and distal aspect of the       bile ducts seen endoscopically seemed a bit edematous. free flow of       contrast slightly impeded. I obtained a CRE dilating balloon and railed       across the ampullary orifice and inflated to 11 mm for 1  minute and took       it down. This dilated the sphincterotomy nicely with minimal bleeding       and without apparent complication. Subsequently I advanced an extractor       balloon to the confluence inflated to accommodate size of the nondilated       duct. A balloon occlusion cholangiogram was performed with recovery of a       few small pebbles. The biliary tree appeared to drain well after these        maneuvers. The pancreatic duct was not injected/cannulated or manulated       in any way.      Total fluoroscopy time 2.8 minutes Impression:               choledocholithiasis - status post endoscopic                            sphincterotomy, sphincterotomy balloon dilation and                            balloon stone extraction                           - Complete removal was accomplished by biliary                            sphincterotomy and stonetome. Slight irregularity                            of the distal bile duct related to sludge or                            possibly edema related to pancreatitis. Moderate Sedation:      Moderate (conscious) sedation was personally administered by an       anesthesia professional. The following parameters were monitored: oxygen       saturation, heart rate, blood pressure, respiratory rate, EKG, adequacy       of pulmonary ventilation, and response to care. Total physician       intraservice time was 33 minutes. Recommendation:           - Clear liquid diet. May begin heparin infusion                            tomorrow as a bridging maneuver only if clinically                            clinically warranted. Eventual cholecystectomy per                            Dr. Henreitta Leber. LFTs tomorrow morning. Procedure Code(s):        --- Professional ---                           [phone], Endoscopic retrograde                            cholangiopancreatography (ERCP); diagnostic,  including collection of specimen(s) by brushing or                            washing, when performed (separate procedure) Diagnosis Code(s):        --- Professional ---                           K80.50, Calculus of bile duct without cholangitis                            or cholecystitis without obstruction CPT copyright 2016 [employer]. All rights reserved. The codes documented in this report are preliminary and upon coder  review may  be revised to meet current compliance requirements. Gerrit Friendsobert M. Rourk, MD Gennette Pacobert Michael Rourk, MD 12/04/2017 2:16:49 PM This report has been signed electronically. Number of Addenda: 0

## 2017-12-04 NOTE — Anesthesia Postprocedure Evaluation (Signed)
Anesthesia Post Note  Patient: [name]  Procedure(s) Performed: ENDOSCOPIC RETROGRADE CHOLANGIOPANCREATOGRAPHY (ERCP) with stone extraction (N/A Esophagus) SPHINCTEROTOMY (N/A Esophagus) CRE BALLOON DILATION (N/A Esophagus)  Patient location during evaluation: PACU Anesthesia Type: General Level of consciousness: awake and patient cooperative Pain management: pain level controlled Vital Signs Assessment: post-procedure vital signs reviewed and stable Respiratory status: spontaneous breathing, nonlabored ventilation and respiratory function stable Cardiovascular status: blood pressure returned to baseline Postop Assessment: no apparent nausea or vomiting Anesthetic complications: no     Last Vitals:  Vitals:   12/04/17 1330 12/04/17 1345  BP: 93/70   Pulse: (!) 112 97  Resp: 14 (!) 25  Temp:    SpO2: 99% 96%    Last Pain:  Vitals:   12/04/17 1345  TempSrc:   PainSc: 0-No pain                 Monya Kozakiewicz J

## 2017-12-04 NOTE — Anesthesia Preprocedure Evaluation (Addendum)
Anesthesia Evaluation  Patient identified by MRN, date of birth, ID band Patient awake    Reviewed: Allergy & Precautions, H&P , NPO status , Patient's Chart, lab work & pertinent test results, reviewed documented beta blocker date and time   Airway Mallampati: II  TM Distance: >3 FB Neck ROM: Full    Dental no notable dental hx. (+) Teeth Intact, Dental Advisory Given   Pulmonary neg pulmonary ROS, COPD, former smoker,    Pulmonary exam normal breath sounds clear to auscultation       Cardiovascular hypertension, Pt. on home beta blockers and Pt. 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)  Rhythm:Irregular Rate:Normal  S/p AVR   Neuro/Psych negative neurological ROS  negative psych ROS   GI/Hepatic Gallstone pancreatitis    Endo/Other  negative endocrine ROS  Renal/GU negative Renal ROS  negative genitourinary   Musculoskeletal   Abdominal   Peds  Hematology negative hematology ROS (+)   Anesthesia Other Findings   Reproductive/Obstetrics negative OB ROS                             Anesthesia Physical Anesthesia Plan  ASA: III  Anesthesia Plan: General   Post-op Pain Management:    Induction: Intravenous  PONV Risk Score and Plan:   Airway Management Planned: Oral ETT  Additional Equipment:   Intra-op Plan:   Post-operative Plan: Extubation in OR  Informed Consent: I have reviewed the patients History and Physical, chart, labs and discussed the procedure including the risks, benefits and alternatives for the proposed anesthesia with the patient or authorized representative who has indicated his/her understanding and acceptance.     Plan Discussed with:   Anesthesia Plan Comments: (Amidate induction , prone position.)        Anesthesia Quick  Evaluation

## 2017-12-04 NOTE — Progress Notes (Signed)
ANTICOAGULATION CONSULT NOTE - Initial Consult  Pharmacy Consult for heparin Indication: atrial fibrillation  Allergies  Allergen Reactions  . Diclofenac Itching  . Naproxen     itching   Patient Measurements: Height: 5\' 10"  (177.8 cm) Weight: 194 lb 0.1 oz (88 kg) IBW/kg (Calculated) : 73 HEPARIN DW (KG): 88  Vital Signs: Temp: 98 F (36.7 C) (01/24 1409) Temp Source: Oral (01/24 1409) BP: 105/75 (01/24 1409) Pulse Rate: 104 (01/24 1409)  Labs: Recent Labs    12/02/17 0517 12/03/17 0435 12/04/17 0536  HGB 13.2 11.1* 10.7*  HCT 39.0 34.6* 33.2*  PLT 221 169 145*  LABPROT 23.7* 22.4* 16.1*  INR 2.14 1.98 1.30  CREATININE 1.09 1.39* 1.06    Estimated Creatinine Clearance: 70.4 mL/min (by C-G formula based on SCr of 1.06 mg/dL).  Medical History: Past Medical History:  Diagnosis Date  . Aortic stenosis    a. s/p bioprosth AVR 04/2010;  b. 05/2012 Echo: EF 50-55%, nl bioprosth fxn w/ 8mmHg gradient, mold dil LA.  [name]. Atrial fibrillation and flutter (HCC)    a. s/p Cox-Maze @ time of AVR;  b. Previously on Amio and s/p DCCV x 2 ->failed->now rate controlled and on chronic coumadin.  [name]. CAD (coronary artery disease)    Occluded OM1 and LPDA in the setting of left atrial thrombus and cardioembolic event, otherwise nonobstructive disease 04/2010  . COPD (chronic obstructive pulmonary disease) (HCC)   . Diverticulosis   . DJD (degenerative joint disease)    a. s/p R TKA 2010;  b. s/p L TKA 2013.  [name]. Essential hypertension   . Gout   . History of pneumonia   . Myocardial infarction (HCC) 04/2010   Cardioembolic with occluded OM1 and left-sided PDA  . 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 Ventricular fibrillation (HCC) 04/2010   VF arrest in setting of thrombotic MI 04/2010  . Wears glasses    Medications:  Medications Prior to Admission  Medication Sig Dispense Refill Last Dose  . acetaminophen (TYLENOL) 500 MG tablet Take 1,000 mg by mouth every 6 (six) hours  as needed.    Past Week at Unknown time  . azithromycin (ZITHROMAX) 250 MG tablet Take 250 mg by mouth daily.   11/30/2017  . benzonatate (TESSALON) 200 MG capsule Take 200 mg by mouth 2 (two) times daily as needed for cough.   11/30/2017  . lisinopril (PRINIVIL,ZESTRIL) 5 MG tablet TAKE ONE (1) TABLET EACH DAY 90 tablet 3 11/30/2017  . metoprolol succinate (TOPROL-XL) 50 MG 24 hr tablet Take 1 tablet (50 mg total) by mouth 2 (two) times daily. Take with or immediately following a meal. 60 tablet 6 11/30/2017 at 1800  . warfarin (COUMADIN) 5 MG tablet Take 2 tablets daily except 1 tablet on Mondays and Fridays or as directed 60 tablet 4 11/30/2017 at 1800   Assessment: 71 yoman on coumadin PTA for afib to start heparin bridge after ERCP.  His INR today is 1.30.    Goal of Therapy:  Heparin level 0.3-0.7 units/ml Monitor platelets by anticoagulation protocol: Yes   Plan:   Heparin infusion at 1250 units/hr  Heparin level daily  CBC daily while on Heparin  12/04/2017,4:28 PM

## 2017-12-04 NOTE — Progress Notes (Signed)
Patients BP was 85/65 at 0500, I rechecked it and it was 108/69 with HR of 108. Patient was scheduled to get metoprolol 2.5 mg at 0600. Notified MD and was told to hold this dose due to low BP. Patient is stable, will continue to monitor.

## 2017-12-04 NOTE — Progress Notes (Signed)
PROGRESS NOTE    EDWORD CU  ZOX:[id number] DOB: 01-21-1946 DOA: 12/02/2017 PCP: Benita Stabile, MD     Brief Narrative:  72 year old man admitted from home on 1/22 due to abdominal pain and emesis.  He was found to have a lipase of over 3000 with elevated LFTs.  CT of the abdomen and pelvis showed cholelithiasis with gallbladder wall thickening with dry and extrahepatic ductal dilatation as well as common bile duct dilatation.  Admission requested, GI and surgical consults were requested.  Patient's procedures have been delayed due to iatrogenic coagulopathy as patient is anticoagulated on Coumadin for A. fib.  Has been seen by cardiology who has cleared patient for procedures.  Patient is status post ERCP on 1/24.  Cholecystectomy is pending.   Assessment & Plan:   Active Problems:   Preoperative cardiovascular examination   Gallstone pancreatitis   Choledocholithiasis   Atrial fibrillation, chronic (HCC)   S/P AVR   Gallstone pancreatitis/choledocholithiasis/cholelithiasis -Status post ERCP today with successful sphincterotomy and balloon stone extraction. -Lipase is down to 329, LFTs with minimal worsening overnight. -Continue Zosyn given cholelithiasis, plan for cholecystectomy this admission after ERCP timing remains unclear at this time.  Chronic atrial fibrillation -Rate is currently controlled.  Received a call from Dr. Henreitta Leber stating that cholecystectomy for tomorrow had been delayed by anesthesiology because apparently there was a heart rate of 190 during ERCP.  This is not documented in patient's flowsheet.  Patient's heart rate is currently in the low 100s. -Warfarin remains on hold in anticipation of ERCP and surgery.  Heparin drip ordered as patient is now subtherapeutic. -Oral metoprolol will be reordered today.  History of coronary artery disease -No chest pain at present, stable  Hypertension -Well-controlled  History of bioprosthetic aortic valve  replacement   DVT prophylaxis: Coumadin, heparin bridge once INR subtherapeutic per cardiology recommendations Code Status: Full code Family Communication: Patient only Disposition Plan: Pending cholecystectomy and medical improvement.  Consultants:   GI  Surgery  Procedures:   None  Antimicrobials:  Anti-infectives (From admission, onward)   Start     Dose/Rate Route Frequency Ordered Stop   12/05/17 0600  ceFAZolin (ANCEF) IVPB 2g/100 mL premix     2 g 200 mL/hr over 30 Minutes Intravenous On call to O.R. 12/04/17 1455 12/06/17 0559   12/02/17 1000  piperacillin-tazobactam (ZOSYN) IVPB 3.375 g     3.375 g 12.5 mL/hr over 240 Minutes Intravenous Every 8 hours 12/02/17 0947     12/02/17 0945  piperacillin-tazobactam (ZOSYN) IVPB 3.375 g  Status:  Discontinued     3.375 g 100 mL/hr over 30 Minutes Intravenous Every 8 hours 12/02/17 0941 12/02/17 0946       Subjective: Less abdominal pain, is hungry.  Seen prior to ERCP today.  Objective: Vitals:   12/04/17 1315 12/04/17 1330 12/04/17 1345 12/04/17 1409  BP: 91/64 93/70  105/75  Pulse: (!) 114 (!) 112 97 (!) 104  Resp: 18 14 (!) 25 20  Temp:    98 F (36.7 C)  TempSrc:    Oral  SpO2: 100% 99% 96% 98%  Weight:      Height:        Intake/Output Summary (Last 24 hours) at 12/04/2017 1620 Last data filed at 12/04/2017 1300 Gross per 24 hour  Intake 3177.5 ml  Output 350 ml  Net 2827.5 ml   Filed Weights   12/02/17 0459 12/02/17 0923  Weight: 88 kg (194 lb) 88 kg (194 lb 0.1 oz)  Examination:  General exam: Alert, awake, oriented x 3 Respiratory system: Clear to auscultation. Respiratory effort normal. Cardiovascular system:RRR. No murmurs, rubs, gallops. Gastrointestinal system: Abdomen is distended, soft and tender to palpation to the right upper quadrant and epigastric area. No organomegaly or masses felt. Normal bowel sounds heard. Central nervous system: Alert and oriented. No focal neurological  deficits. Extremities: No C/C/E, +pedal pulses Skin: No rashes, lesions or ulcers Psychiatry: Judgement and insight appear normal. Mood & affect appropriate.     Data Reviewed: I have personally reviewed following labs and imaging studies  CBC: Recent Labs  Lab 12/02/17 0517 12/03/17 0435 12/04/17 0536  WBC 8.4 8.1 6.0  NEUTROABS 7.7  --  5.0  HGB 13.2 11.1* 10.7*  HCT 39.0 34.6* 33.2*  MCV 101.3* 105.8* 106.4*  PLT 221 169 145*   Basic Metabolic Panel: Recent Labs  Lab 12/02/17 0517 12/03/17 0435 12/04/17 0536  NA 135 139 137  K 3.5 4.8 4.2  CL 101 106 107  CO2 21* 22 20*  GLUCOSE 161* 111* 105*  BUN 20 24* 23*  CREATININE 1.09 1.39* 1.06  CALCIUM 9.1 7.9* 8.0*  MG  --   --  1.6*   GFR: Estimated Creatinine Clearance: 70.4 mL/min (by C-G formula based on SCr of 1.06 mg/dL). Liver Function Tests: Recent Labs  Lab 12/02/17 0517 12/03/17 0435 12/04/17 0536  AST 250* 84* 110*  ALT 86* 67* 66*  ALKPHOS 277* 148* 147*  BILITOT 1.3* 1.5* 4.3*  PROT 7.4 6.5 6.9  ALBUMIN 3.5 3.0* 2.9*   Recent Labs  Lab 12/02/17 0517 12/03/17 0900 12/04/17 0536  LIPASE 3,298* 1,375* 329*   No results for input(s): AMMONIA in the last 168 hours. Coagulation Profile: Recent Labs  Lab 12/02/17 0517 12/03/17 0435 12/04/17 0536  INR 2.14 1.98 1.30   Cardiac Enzymes: No results for input(s): CKTOTAL, CKMB, CKMBINDEX, TROPONINI in the last 168 hours. BNP (last 3 results) No results for input(s): PROBNP in the last 8760 hours. HbA1C: Recent Labs    12/02/17 1000  HGBA1C 5.7*   CBG: No results for input(s): GLUCAP in the last 168 hours. Lipid Profile: Recent Labs    12/02/17 1000  CHOL 131  HDL 62  LDLCALC 51  TRIG 92  CHOLHDL 2.1   Thyroid Function Tests: No results for input(s): TSH, T4TOTAL, FREET4, T3FREE, THYROIDAB in the last 72 hours. Anemia Panel: No results for input(s): VITAMINB12, FOLATE, FERRITIN, TIBC, IRON, RETICCTPCT in the last 72  hours. Urine analysis:    Component Value Date/Time   COLORURINE YELLOW 12/02/2017 2335   APPEARANCEUR CLEAR 12/02/2017 2335   LABSPEC 1.036 (H) 12/02/2017 2335   PHURINE 5.0 12/02/2017 2335   GLUCOSEU NEGATIVE 12/02/2017 2335   HGBUR SMALL (A) 12/02/2017 2335   BILIRUBINUR NEGATIVE 12/02/2017 2335   KETONESUR NEGATIVE 12/02/2017 2335   PROTEINUR NEGATIVE 12/02/2017 2335   UROBILINOGEN 1.0 07/14/2012 1243   NITRITE NEGATIVE 12/02/2017 2335   LEUKOCYTESUR NEGATIVE 12/02/2017 2335   Sepsis Labs: @LABRCNTIP (procalcitonin:4,lacticidven:4)  )No results found for this or any previous visit (from the past 240 hour(s)).       Radiology Studies: Dg Ercp Biliary & Pancreatic Ducts  Result Date: 12/04/2017 CLINICAL DATA:  Choledocholithiasis EXAM: ERCP WITH SPHINCTEROTOMY TECHNIQUE: Multiple spot images obtained with the fluoroscopic device and submitted for interpretation post-procedure. FLUOROSCOPY TIME:  Fluoroscopy Time:  2 minutes 51 seconds Radiation Exposure Index (if provided by the fluoroscopic device): Not provided Number of Acquired Spot Images: multiple fluoroscopic screen captures COMPARISON:  MRCP 12/02/2017 FINDINGS: Images demonstrate upper normal diameter CBD with smooth walls. A few small filling defects are seen at the distal CBD compatible with choledocholithiasis. Multiple small air bubbles are seen later in the exam within the distal CBD. Passage of a balloon catheter was made with clearance of filling defects. Endoscopic visualization confirmed removal of stones with balloon catheter passage. Last image demonstrates adequate drainage of contrast from the the CBD IMPRESSION: Choledocholithiasis. These images were submitted for radiologic interpretation only. Please see the procedural report for the amount of contrast and the fluoroscopy time utilized. Electronically Signed   By: Ulyses SouthwardMark  Boles M.D.   On: 12/04/2017 14:19        Scheduled Meds: . Chlorhexidine Gluconate  Cloth  6 each Topical Once   And  . Chlorhexidine Gluconate Cloth  6 each Topical Once  . iopamidol      . metoprolol tartrate  2.5 mg Intravenous Q6H   Continuous Infusions: . 0.9 % NaCl with KCl 20 mEq / L 75 mL/hr at 12/04/17 1412  . [START ON 12/05/2017]  ceFAZolin (ANCEF) IV    . piperacillin-tazobactam (ZOSYN)  IV Stopped (12/04/17 1504)     LOS: 2 days    Time spent: 25 minutes. Greater than 50% of this time was spent in direct contact with the patient coordinating care.     Chaya JanEstela Hernandez Acosta, MD [facility] Pager [phone]  If 7PM-7AM, please contact night-coverage [email or web address] Password [email or web address] 12/04/2017, 4:20 PM

## 2017-12-04 NOTE — Progress Notes (Signed)
Patient seen and examined in short stay. INR down to 1.30. Abdominal pain continues to settle down.  Plan for an ERCP with the sphincterotomy/ stone extraction later this morning per plan. The risks benefits alternatives again have been reviewed with patient and family members. All parties agreeable.  Further recommendations to follow. I discussed with anesthesia.

## 2017-12-04 NOTE — Progress Notes (Signed)
[facility] Progress Note  Day of Surgery  Subjective: No major issues. Some distention. Getting ERCP today. INR down to 1.3. Feels bloated, having some gas per his report.   Objective: Vital signs in last 24 hours: Temp:  [97.7 F (36.5 C)-98.6 F (37 C)] 97.7 F (36.5 C) (01/24 1038) Pulse Rate:  [108-115] 108 (01/24 0553) Resp:  [18-20] 19 (01/24 1045) BP: (85-130)/(40-82) 119/82 (01/24 1038) SpO2:  [89 %-100 %] 91 % (01/24 1045) Last BM Date: 12/01/17  Intake/Output from previous day: 01/23 0701 - 01/24 0700 In: 2827.5 [I.V.:2577.5; IV Piggyback:250] Out: 350 [Urine:350] Intake/Output this shift: No intake/output data recorded.  General appearance: alert, cooperative and no distress Resp: normal work of breathing GI: distended, minimally tender, no rebound or guarding Extremities: extremities normal, atraumatic, no cyanosis or edema  Lab Results:  Recent Labs    12/03/17 0435 12/04/17 0536  WBC 8.1 6.0  HGB 11.1* 10.7*  HCT 34.6* 33.2*  PLT 169 145*   BMET Recent Labs    12/03/17 0435 12/04/17 0536  NA 139 137  K 4.8 4.2  CL 106 107  CO2 22 20*  GLUCOSE 111* 105*  BUN 24* 23*  CREATININE 1.39* 1.06  CALCIUM 7.9* 8.0*   PT/INR Recent Labs    12/03/17 0435 12/04/17 0536  LABPROT 22.4* 16.1*  INR 1.98 1.30    Studies/Results: Mr 3d Recon At Scanner  Result Date: 12/02/2017 CLINICAL DATA:  Upper abdominal pain. Abnormal liver function tests. Dilated common bile duct on ultrasound. Acute pancreatitis on CT scan. EXAM: MRI ABDOMEN WITHOUT AND WITH CONTRAST (INCLUDING MRCP) TECHNIQUE: Multiplanar multisequence MR imaging of the abdomen was performed both before and after the administration of intravenous contrast. Heavily T2-weighted images of the biliary and pancreatic ducts were obtained, and three-dimensional MRCP images were rendered by post processing. CONTRAST:  18mL MULTIHANCE GADOBENATE DIMEGLUMINE 529 MG/ML IV SOLN COMPARISON:   12/02/2017 CT scan FINDINGS: Despite efforts by the technologist and patient, motion artifact is present on today's exam and could not be eliminated. This is a common result when MRCP is attempted in the inpatient setting where patients are less likely to be able to breath hold and cooperate in controlling motion. This reduces exam sensitivity and specificity. Lower chest: Stable cardiomegaly. Aortic valve prosthesis. Mild atelectasis in the posterior basal segments of both lower lobes. Hepatobiliary: Cholelithiasis is observed. On images 45 through 50 of series 5, there is low signal intensity filling defect in the distal CBD compatible with small stones in the setting of choledocholithiasis. This is a likely cause for the biliary dilatation and pancreatitis. There appear to be multiple clustered calculi in the 5 mm in less range in the vicinity of the ampulla. The filling defects are also shown on image 23/4 and images 29 through 31 of series 24. Mild extrahepatic biliary dilatation, with the common bile duct at about 9 mm. Pancreas: Peripancreatic edema compatible with acute pancreatitis. No abscess, pseudocyst, or findings of pancreatic necrosis. Spleen:  Unremarkable Adrenals/Urinary Tract:  Unremarkable Stomach/Bowel: Mild secondary inflammation of the duodenum. Vascular/Lymphatic:  Aortoiliac atherosclerotic vascular disease. Other: Small amount of ascites along the inferior right hepatic margin. Musculoskeletal: Unremarkable IMPRESSION: 1. Choledocholithiasis with several clustered small (less than 5 mm) stones in the distal CBD. These at the likely cause of the mild biliary dilatation and acute pancreatitis. 2. Peripancreatic edema compatible with acute pancreatitis. No findings of pancreatic abscess, pseudocyst, or pancreatic necrosis. 3. Stable cardiomegaly with aortic valve prosthesis. 4. Mild subsegmental  atelectasis in the posterior basal segments of both lower lobes. 5. Gallstones in the gallbladder.  6.  Aortic Atherosclerosis (ICD10-I70.0). Electronically Signed   By: Gaylyn Rong M.D.   On: 12/02/2017 15:13   Mr Abdomen Mrcp Vivien Rossetti Contast  Result Date: 12/02/2017 CLINICAL DATA:  Upper abdominal pain. Abnormal liver function tests. Dilated common bile duct on ultrasound. Acute pancreatitis on CT scan. EXAM: MRI ABDOMEN WITHOUT AND WITH CONTRAST (INCLUDING MRCP) TECHNIQUE: Multiplanar multisequence MR imaging of the abdomen was performed both before and after the administration of intravenous contrast. Heavily T2-weighted images of the biliary and pancreatic ducts were obtained, and three-dimensional MRCP images were rendered by post processing. CONTRAST:  18mL MULTIHANCE GADOBENATE DIMEGLUMINE 529 MG/ML IV SOLN COMPARISON:  12/02/2017 CT scan FINDINGS: Despite efforts by the technologist and patient, motion artifact is present on today's exam and could not be eliminated. This is a common result when MRCP is attempted in the inpatient setting where patients are less likely to be able to breath hold and cooperate in controlling motion. This reduces exam sensitivity and specificity. Lower chest: Stable cardiomegaly. Aortic valve prosthesis. Mild atelectasis in the posterior basal segments of both lower lobes. Hepatobiliary: Cholelithiasis is observed. On images 45 through 50 of series 5, there is low signal intensity filling defect in the distal CBD compatible with small stones in the setting of choledocholithiasis. This is a likely cause for the biliary dilatation and pancreatitis. There appear to be multiple clustered calculi in the 5 mm in less range in the vicinity of the ampulla. The filling defects are also shown on image 23/4 and images 29 through 31 of series 24. Mild extrahepatic biliary dilatation, with the common bile duct at about 9 mm. Pancreas: Peripancreatic edema compatible with acute pancreatitis. No abscess, pseudocyst, or findings of pancreatic necrosis. Spleen:  Unremarkable  Adrenals/Urinary Tract:  Unremarkable Stomach/Bowel: Mild secondary inflammation of the duodenum. Vascular/Lymphatic:  Aortoiliac atherosclerotic vascular disease. Other: Small amount of ascites along the inferior right hepatic margin. Musculoskeletal: Unremarkable IMPRESSION: 1. Choledocholithiasis with several clustered small (less than 5 mm) stones in the distal CBD. These at the likely cause of the mild biliary dilatation and acute pancreatitis. 2. Peripancreatic edema compatible with acute pancreatitis. No findings of pancreatic abscess, pseudocyst, or pancreatic necrosis. 3. Stable cardiomegaly with aortic valve prosthesis. 4. Mild subsegmental atelectasis in the posterior basal segments of both lower lobes. 5. Gallstones in the gallbladder. 6.  Aortic Atherosclerosis (ICD10-I70.0). Electronically Signed   By: Gaylyn Rong M.D.   On: 12/02/2017 15:13   US Abdomen Limited Ruq  Result Date: 12/02/2017 CLINICAL DATA:  Acute cholelithiasis, pancreatitis. EXAM: ULTRASOUND ABDOMEN LIMITED RIGHT UPPER QUADRANT COMPARISON:  CT of the abdomen and pelvis from the same day. FINDINGS: Gallbladder: Sludge is present within the gallbladder. No visible stones are present. Wall is thickened, measuring up to 7 mm. There is no sonographic Murphy sign. Common bile duct: Diameter: The common bile duct is dilated measuring up to 11.6 mm. Liver: Liver is diffusely echogenic. No discrete lesions are present. Portal vein is patent on color Doppler imaging with normal direction of blood flow towards the liver. IMPRESSION: 1. Dilated common bile duct likely associated with the acute pancreatitis. 2. Gallbladder wall thickening and sludge may also be associated with acute pancreatitis. 3. No gallstones or sonographic Murphy sign to suggest primary gallbladder pathology. Electronically Signed   By: Marin Roberts M.D.   On: 12/02/2017 11:27    Anti-infectives: Anti-infectives (From admission, onward)  Start      Dose/Rate Route Frequency Ordered Stop   12/02/17 1000  [MAR Hold]  piperacillin-tazobactam (ZOSYN) IVPB 3.375 g     (MAR Hold since 12/04/17 1032)   3.375 g 12.5 mL/hr over 240 Minutes Intravenous Every 8 hours 12/02/17 0947     12/02/17 0945  piperacillin-tazobactam (ZOSYN) IVPB 3.375 g  Status:  Discontinued     3.375 g 100 mL/hr over 30 Minutes Intravenous Every 8 hours 12/02/17 0941 12/02/17 0946      Assessment/Plan: [name] is a 72 yo with gallstone pancreatitis who is undergoing and ERCP today. Hope to do laparoscopic cholecystectomy tomorrow.   NPO at midnight Likely has an ileus and could worsen Hold heparin gtt at midnight Cardiac risk stratification done and acceptable risk  PLAN: I counseled the patient about the indication, risks and benefits of laparoscopic cholecystectomy.  He understands there is a very small chance for bleeding, infection, injury to normal structures (including common bile duct), conversion to open surgery, persistent symptoms, evolution of postcholecystectomy diarrhea, need for secondary interventions, anesthesia reaction, cardiopulmonary issues and other risks not specifically detailed here. I described the expected recovery, the plan for follow-up and the restrictions during the recovery phase.  All questions were answered.    LOS: 2 days    Lucretia Roers 12/04/2017

## 2017-12-04 NOTE — Anesthesia Procedure Notes (Signed)
Procedure Name: Intubation Date/Time: 12/04/2017 11:42 AM Performed by: Charmaine Downs, CRNA Pre-anesthesia Checklist: Patient identified, Emergency Drugs available, Suction available and Patient being monitored Patient Re-evaluated:Patient Re-evaluated prior to induction Preoxygenation: Pre-oxygenation with 100% oxygen Induction Type: IV induction, Rapid sequence and Cricoid Pressure applied Ventilation: Mask ventilation without difficulty Laryngoscope Size: Mac and 4 Grade View: Grade II Tube size: 8.0 mm Number of attempts: 1 Airway Equipment and Method: Stylet Placement Confirmation: ETT inserted through vocal cords under direct vision,  positive ETCO2 and breath sounds checked- equal and bilateral Secured at: 22 cm Tube secured with: Tape Dental Injury: Teeth and Oropharynx as per pre-operative assessment

## 2017-12-04 NOTE — Progress Notes (Signed)
Was informed by telemetry that patient was having vent bigeminy while on the monitor. Patient HR was also tachy in the 100`s patient was stable and asymptomatic with no complaints. Paged MD to make them aware, MD ordered a mag level to be drawn in the am. Will continue to monitor patient.

## 2017-12-04 NOTE — Progress Notes (Signed)
[facility]  Spoke with Dr. Jayme CloudGonzalez today regarding ERCP. Patient reported to have HR to 190s today. Dr. Jayme CloudGonzalez felt that patient needed to be under better control, and needs some more pulmonary toilet prior to the OR.  Patient was distended this AM and likely has ileus and not taking in much fluids. Potentially some of this is from dehydration. Spoke with Dr. Ardyth HarpsHernandez. Will hold off on the cholecystectomy tomorrow, and give the patient longer to cool down.  OR Monday for Laparoscopic cholecystectomy.  Will discuss with patient in AM.   Algis GreenhouseLindsay Avyn Aden, MD [facility] [address] [place], [place] [phone] (office)

## 2017-12-04 NOTE — Transfer of Care (Signed)
Immediate Anesthesia Transfer of Care Note  Patient: [name]  Procedure(s) Performed: ENDOSCOPIC RETROGRADE CHOLANGIOPANCREATOGRAPHY (ERCP) with stone extraction (N/A Esophagus) SPHINCTEROTOMY (N/A Esophagus) CRE BALLOON DILATION (N/A Esophagus)  Patient Location: PACU  Anesthesia Type:General  Level of Consciousness: awake and patient cooperative  Airway & Oxygen Therapy: Patient Spontanous Breathing and Patient connected to face mask oxygen  Post-op Assessment: Report given to RN, Post -op Vital signs reviewed and stable and Patient moving all extremities  Post vital signs: Reviewed and stable  Last Vitals:  Vitals:   12/04/17 1120 12/04/17 1125  BP: 117/82   Pulse:    Resp: 15 18  Temp:    SpO2: 97% 96%    Last Pain:  Vitals:   12/04/17 1038  TempSrc: Oral  PainSc: 4       Patients Stated Pain Goal: 9 (12/04/17 1038)  Complications: No apparent anesthesia complications

## 2017-12-05 DIAGNOSIS — R945 Abnormal results of liver function studies: Secondary | ICD-10-CM

## 2017-12-05 DIAGNOSIS — Z9889 Other specified postprocedural states: Secondary | ICD-10-CM

## 2017-12-05 DIAGNOSIS — R7989 Other specified abnormal findings of blood chemistry: Secondary | ICD-10-CM

## 2017-12-05 LAB — CBC
HCT: 32.5 % — ABNORMAL LOW (ref 39.0–52.0)
HEMOGLOBIN: 10.4 g/dL — AB (ref 13.0–17.0)
MCH: 34.1 pg — ABNORMAL HIGH (ref 26.0–34.0)
MCHC: 32 g/dL (ref 30.0–36.0)
MCV: 106.6 fL — ABNORMAL HIGH (ref 78.0–100.0)
Platelets: 190 10*3/uL (ref 150–400)
RBC: 3.05 MIL/uL — AB (ref 4.22–5.81)
RDW: 13.8 % (ref 11.5–15.5)
WBC: 8.9 10*3/uL (ref 4.0–10.5)

## 2017-12-05 LAB — COMPREHENSIVE METABOLIC PANEL
ALK PHOS: 158 U/L — AB (ref 38–126)
ALT: 76 U/L — ABNORMAL HIGH (ref 17–63)
ANION GAP: 10 (ref 5–15)
AST: 95 U/L — ABNORMAL HIGH (ref 15–41)
Albumin: 2.9 g/dL — ABNORMAL LOW (ref 3.5–5.0)
BILIRUBIN TOTAL: 2.1 mg/dL — AB (ref 0.3–1.2)
BUN: 26 mg/dL — ABNORMAL HIGH (ref 6–20)
CALCIUM: 8 mg/dL — AB (ref 8.9–10.3)
CO2: 20 mmol/L — ABNORMAL LOW (ref 22–32)
Chloride: 104 mmol/L (ref 101–111)
Creatinine, Ser: 1.12 mg/dL (ref 0.61–1.24)
GFR calc non Af Amer: >60 mL/min (ref >60)
Glucose, Bld: 136 mg/dL — ABNORMAL HIGH (ref 65–99)
Potassium: 4.4 mmol/L (ref 3.5–5.1)
SODIUM: 134 mmol/L — AB (ref 135–145)
TOTAL PROTEIN: 6.8 g/dL (ref 6.5–8.1)

## 2017-12-05 LAB — HEPATIC FUNCTION PANEL
ALBUMIN: 2.9 g/dL — AB (ref 3.5–5.0)
ALK PHOS: 159 U/L — AB (ref 38–126)
ALT: 78 U/L — AB (ref 17–63)
AST: 96 U/L — ABNORMAL HIGH (ref 15–41)
Bilirubin, Direct: 0.9 mg/dL — ABNORMAL HIGH (ref 0.1–0.5)
Indirect Bilirubin: 1.1 mg/dL — ABNORMAL HIGH (ref 0.3–0.9)
TOTAL PROTEIN: 6.9 g/dL (ref 6.5–8.1)
Total Bilirubin: 2 mg/dL — ABNORMAL HIGH (ref 0.3–1.2)

## 2017-12-05 LAB — HEPARIN LEVEL (UNFRACTIONATED): Heparin Unfractionated: <0.1 IU/mL — ABNORMAL LOW (ref 0.30–0.70)

## 2017-12-05 MED ORDER — MAGNESIUM SULFATE 2 GM/50ML IV SOLN
2.0000 g | Freq: Once | INTRAVENOUS | Status: AC
  Administered 2017-12-05: 2 g via INTRAVENOUS
  Filled 2017-12-05: qty 50

## 2017-12-05 MED ORDER — POLYETHYLENE GLYCOL 3350 17 G PO PACK
17.0000 g | PACK | Freq: Every day | ORAL | Status: DC
  Administered 2017-12-05 – 2017-12-06 (×2): 17 g via ORAL
  Filled 2017-12-05 (×3): qty 1

## 2017-12-05 MED ORDER — ENOXAPARIN SODIUM 100 MG/ML ~~LOC~~ SOLN
90.0000 mg | Freq: Two times a day (BID) | SUBCUTANEOUS | Status: AC
  Administered 2017-12-05 – 2017-12-07 (×4): 90 mg via SUBCUTANEOUS
  Filled 2017-12-05 (×5): qty 1

## 2017-12-05 NOTE — Addendum Note (Signed)
Addendum  created 12/05/17 0948 by Moshe Salisburyaniel, Leonides Minder E, CRNA   Sign clinical note

## 2017-12-05 NOTE — Progress Notes (Signed)
Progress Note  Patient Name: [name] J Bookbinder Date of Encounter: 12/05/2017  Primary Cardiologist: Diona BrownerMcDowell  Subjective   No compliants  Inpatient Medications    Scheduled Meds: . metoprolol tartrate  50 mg Oral BID   Continuous Infusions: . 0.9 % NaCl with KCl 20 mEq / L 75 mL/hr at 12/04/17 1412  .  ceFAZolin (ANCEF) IV    . heparin 1,250 Units/hr (12/04/17 1709)  . piperacillin-tazobactam (ZOSYN)  IV Stopped (12/05/17 0645)   PRN Meds: acetaminophen **OR** acetaminophen, HYDROmorphone (DILAUDID) injection, ondansetron **OR** ondansetron (ZOFRAN) IV   Vital Signs    Vitals:   12/04/17 2033 12/04/17 2150 12/05/17 0300 12/05/17 0600  BP: 118/73 114/69 117/71   Pulse: (!) 101 (!) 101 (!) 45 96  Resp: 18  18   Temp: 98 F (36.7 C)  (!) 97.5 F (36.4 C)   TempSrc: Oral  Oral   SpO2: 97%  98%   Weight:      Height:        Intake/Output Summary (Last 24 hours) at 12/05/2017 0844 Last data filed at 12/05/2017 0600 Gross per 24 hour  Intake 1930.63 ml  Output 300 ml  Net 1630.63 ml   Filed Weights   12/02/17 0459 12/02/17 0923  Weight: 194 lb (88 kg) 194 lb 0.1 oz (88 kg)    Telemetry    Rate controlled afib  ECG    n/a  Physical Exam   GEN: No acute distress.   Neck: No JVD Cardiac: irreg, no murmurs, rubs, or gallops.  Respiratory: Clear to auscultation bilaterally. GI: Soft, nontender, non-distended  MS: No edema; No deformity. Neuro:  Nonfocal  Psych: Normal affect   Labs    Chemistry Recent Labs  Lab 12/03/17 0435 12/04/17 0536 12/05/17 0617  NA 139 137 134*  K 4.8 4.2 4.4  CL 106 107 104  CO2 22 20* 20*  GLUCOSE 111* 105* 136*  BUN 24* 23* 26*  CREATININE 1.39* 1.06 1.12  CALCIUM 7.9* 8.0* 8.0*  PROT 6.5 6.9 6.9  6.8  ALBUMIN 3.0* 2.9* 2.9*  2.9*  AST 84* 110* 96*  95*  ALT 67* 66* 78*  76*  ALKPHOS 148* 147* 159*  158*  BILITOT 1.5* 4.3* 2.0*  2.1*  GFRNONAA 49* >60 >60  GFRAA 57* >60 >60  ANIONGAP 11 10 10       Hematology Recent Labs  Lab 12/03/17 0435 12/04/17 0536 12/05/17 0617  WBC 8.1 6.0 8.9  RBC 3.27* 3.12* 3.05*  HGB 11.1* 10.7* 10.4*  HCT 34.6* 33.2* 32.5*  MCV 105.8* 106.4* 106.6*  MCH 33.9 34.3* 34.1*  MCHC 32.1 32.2 32.0  RDW 13.1 13.3 13.8  PLT 169 145* 190    Cardiac EnzymesNo results for input(s): TROPONINI in the last 168 hours. No results for input(s): TROPIPOC in the last 168 hours.   BNPNo results for input(s): BNP, PROBNP in the last 168 hours.   DDimer No results for input(s): DDIMER in the last 168 hours.   Radiology    Dg Ercp Biliary & Pancreatic Ducts  Result Date: 12/04/2017 CLINICAL DATA:  Choledocholithiasis EXAM: ERCP WITH SPHINCTEROTOMY TECHNIQUE: Multiple spot images obtained with the fluoroscopic device and submitted for interpretation post-procedure. FLUOROSCOPY TIME:  Fluoroscopy Time:  2 minutes 51 seconds Radiation Exposure Index (if provided by the fluoroscopic device): Not provided Number of Acquired Spot Images: multiple fluoroscopic screen captures COMPARISON:  MRCP 12/02/2017 FINDINGS: Images demonstrate upper normal diameter CBD with smooth walls. A few small filling defects  are seen at the distal CBD compatible with choledocholithiasis. Multiple small air bubbles are seen later in the exam within the distal CBD. Passage of a balloon catheter was made with clearance of filling defects. Endoscopic visualization confirmed removal of stones with balloon catheter passage. Last image demonstrates adequate drainage of contrast from the the CBD IMPRESSION: Choledocholithiasis. These images were submitted for radiologic interpretation only. Please see the procedural report for the amount of contrast and the fluoroscopy time utilized. Electronically Signed   By: Ulyses Southward M.D.   On: 12/04/2017 14:19    Cardiac Studies     Patient Profile     [name] is a 72 y.o. male with a hx of afib, cardioembolic MI, bioprosthatic AVR who is being seen today  for the evaluation of preoperative evluation in setting of chronic anticoagulation at the request of Dr Tat.    Assessment & Plan    1. Chronic Afib - chronic afib rate controlled on coumadin at home. Would expect some mildly elevated rates in setting of gallstone pancreatitis with ongoing pain.  - given his prior cardioembolic event would recommend heparin bridge when INR is less than 2 as acceptable for the upcoming GI procedure. Ok to be off all anticoag as needed for GI procedure, restart hep gtt and coumadin when ok from procedure standpoint.   - patient on Toprol XL 50mg  bid at home.  - had been on IV lopressor here due to NPO status 2.5mg  IV q6 hrs. Some doses held yesterday due to soft bp's, looks like only received 1 dose yesterday at 1AM. Had some tachycardia during ERCP yesterday.  - normal bp's today - I think tachycardia was related to not receiving beta blocker as well as increased demand in setting of pancreatitis and procedure. Can monitor today back on oral lopressor (short acting ok at this time as hemodynamics settle, chart indicates he is on Toprol at home) - Now back on oral lopressor 50mg  bid. Keep K at 4 and Mg 2, Ill give 2g of IV magnesium sulfate today - on hep gtt bridge due to prior cardioembolic event - will need beta blocker in his system day of surgery   2. History of bioprosthetic AVR - with bioprosthetic AVR he has no specific indication for anticoagulation from a valve standpoint, however given his afib and prior cardioembolic event will need heparin bridging when INR is <2.  - last echo with normal function. No recent symptoms. No significant murmur on exam.    3. CAD - no evidence of acute issues - from cath note appears main issues was emboli as opposed to obstructive CAD when he had his cath   4. Gallstone pancreatitis/Preop evaluation - plans for ERCP and stone removal.  - from cardiac standpoint he has no active acute conditions. Chronic  conditions are stable. Tolerates greater than without limitatoin. Recommend proceeding with procedure/surgery.    For questions or updates, please contact [facility] Please consult [email or web address] for contact info under Cardiology/STEMI.      Joanie Coddington, MD  12/05/2017, 8:44 AM

## 2017-12-05 NOTE — Progress Notes (Signed)
ANTICOAGULATION CONSULT NOTE - Initial Consult  Pharmacy Consult for heparin >> Lovenox Indication: atrial fibrillation  Allergies  Allergen Reactions  . Diclofenac Itching  . Naproxen     itching   Patient Measurements: Height: 5\' 10"  (177.8 cm) Weight: 194 lb 0.1 oz (88 kg) IBW/kg (Calculated) : 73 HEPARIN DW (KG): 88  Vital Signs: Temp: 97.5 F (36.4 C) (01/25 0300) Temp Source: Oral (01/25 0300) BP: 117/71 (01/25 0300) Pulse Rate: 96 (01/25 0600)  Labs: Recent Labs    12/03/17 0435 12/04/17 0536 12/05/17 0617  HGB 11.1* 10.7* 10.4*  HCT 34.6* 33.2* 32.5*  PLT 169 145* 190  LABPROT 22.4* 16.1*  --   INR 1.98 1.30  --   CREATININE 1.39* 1.06 1.12   Estimated Creatinine Clearance: 66.6 mL/min (by C-G formula based on SCr of 1.12 mg/dL).  Medical History: Past Medical History:  Diagnosis Date  . Aortic stenosis    a. s/p bioprosth AVR 04/2010;  b. 05/2012 Echo: EF 50-55%, nl bioprosth fxn w/ 8mmHg gradient, mold dil LA.  Marland Kitchen. Atrial fibrillation and flutter (HCC)    a. s/p Cox-Maze @ time of AVR;  b. Previously on Amio and s/p DCCV x 2 ->failed->now rate controlled and on chronic coumadin.  Marland Kitchen. CAD (coronary artery disease)    Occluded OM1 and LPDA in the setting of left atrial thrombus and cardioembolic event, otherwise nonobstructive disease 04/2010  . COPD (chronic obstructive pulmonary disease) (HCC)   . Diverticulosis   . DJD (degenerative joint disease)    a. s/p R TKA 2010;  b. s/p L TKA 2013.  Marland Kitchen. Essential hypertension   . Gout   . History of pneumonia   . Myocardial infarction (HCC) 04/2010   Cardioembolic with occluded OM1 and left-sided PDA  . 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 Ventricular fibrillation (HCC) 04/2010   VF arrest in setting of thrombotic MI 04/2010  . Wears glasses    Medications:  Medications Prior to Admission  Medication Sig Dispense Refill Last Dose  . acetaminophen (TYLENOL) 500 MG tablet Take 1,000 mg by mouth every 6  (six) hours as needed.    Past Week at Unknown time  . azithromycin (ZITHROMAX) 250 MG tablet Take 250 mg by mouth daily.   11/30/2017  . benzonatate (TESSALON) 200 MG capsule Take 200 mg by mouth 2 (two) times daily as needed for cough.   11/30/2017  . lisinopril (PRINIVIL,ZESTRIL) 5 MG tablet TAKE ONE (1) TABLET EACH DAY 90 tablet 3 11/30/2017  . metoprolol succinate (TOPROL-XL) 50 MG 24 hr tablet Take 1 tablet (50 mg total) by mouth 2 (two) times daily. Take with or immediately following a meal. 60 tablet 6 11/30/2017 at 1800  . warfarin (COUMADIN) 5 MG tablet Take 2 tablets daily except 1 tablet on Mondays and Fridays or as directed 60 tablet 4 11/30/2017 at 1800   Assessment: 71 yoman on coumadin PTA for afib to start heparin bridge after ERCP.  Plans for surgery delayed until Monday.  Plan to switch to Lovenox for anticoagulation for afib.  D/W Dr Ardyth HarpsHernandez.  Plan for last dose of Lovenox to be Sunday at 6pm.      Goal of Therapy:  Full dose anticoagulation with Lovenox Monitor platelets by anticoagulation protocol: Yes   Plan:   Lovenox 1mg /Kg SQ q12hrs (last dose on Sunday at 6pm)  Monitor labs, s/sx bleeding complications.   12/05/2017,9:59 AM

## 2017-12-05 NOTE — Progress Notes (Signed)
Subjective: Minimal periumbilical abdominal pain. Denies N/V, no BM in 24 hours. Has been up to chair and ambulating in the halls with PT. Feels abdomen less distended compared to yesterday. Overall feels better compared to yesterday. No other GI complaints.  Objective: Vital signs in last 24 hours: Temp:  [97.5 F (36.4 C)-98 F (36.7 C)] 97.5 F (36.4 C) (01/25 0300) Pulse Rate:  [45-101] 96 (01/25 0600) Resp:  [18] 18 (01/25 0300) BP: (110-118)/(69-74) 117/71 (01/25 0300) SpO2:  [97 %-98 %] 98 % (01/25 0300) Last BM Date: 12/01/17 General:   Alert and oriented, pleasant Head:  Normocephalic and atraumatic. Eyes:  No icterus, sclera clear. Conjuctiva pink.  Heart:  S1, S2 present, no murmurs noted.  Lungs: Clear to auscultation bilaterally, without wheezing, rales, or rhonchi.  Abdomen:  Bowel sounds present, soft, non-tender. Abdomen distended but less than yesterday. No HSM or hernias noted. No rebound or guarding. No masses appreciated  Msk:  Symmetrical without gross deformities. Pulses:  Normal bilateral DP pulses noted. Extremities:  Without clubbing or edema. Neurologic:  Alert and  oriented x4;  grossly normal neurologically. Psych:  Alert and cooperative. Normal mood and affect.  Intake/Output from previous day: 01/24 0701 - 01/25 0700 In: 1930.6 [P.O.:1270; I.V.:510.6; IV Piggyback:150] Out: 300 [Urine:300] Intake/Output this shift: Total I/O In: 3865.6 [P.O.:1320; I.V.:2445.6; IV Piggyback:100] Out: 600 [Urine:600]  Lab Results: Recent Labs    12/03/17 0435 12/04/17 0536 12/05/17 0617  WBC 8.1 6.0 8.9  HGB 11.1* 10.7* 10.4*  HCT 34.6* 33.2* 32.5*  PLT 169 145* 190   BMET Recent Labs    12/03/17 0435 12/04/17 0536 12/05/17 0617  NA 139 137 134*  K 4.8 4.2 4.4  CL 106 107 104  CO2 22 20* 20*  GLUCOSE 111* 105* 136*  BUN 24* 23* 26*  CREATININE 1.39* 1.06 1.12  CALCIUM 7.9* 8.0* 8.0*   LFT Recent Labs    12/03/17 0435 12/04/17 0536  12/05/17 0617  PROT 6.5 6.9 6.9  6.8  ALBUMIN 3.0* 2.9* 2.9*  2.9*  AST 84* 110* 96*  95*  ALT 67* 66* 78*  76*  ALKPHOS 148* 147* 159*  158*  BILITOT 1.5* 4.3* 2.0*  2.1*  BILIDIR  --   --  0.9*  IBILI  --   --  1.1*   PT/INR Recent Labs    12/03/17 0435 12/04/17 0536  LABPROT 22.4* 16.1*  INR 1.98 1.30   Hepatitis Panel No results for input(s): HEPBSAG, HCVAB, HEPAIGM, HEPBIGM in the last 72 hours.   Studies/Results: Dg Ercp Biliary & Pancreatic Ducts  Result Date: 12/04/2017 CLINICAL DATA:  Choledocholithiasis EXAM: ERCP WITH SPHINCTEROTOMY TECHNIQUE: Multiple spot images obtained with the fluoroscopic device and submitted for interpretation post-procedure. FLUOROSCOPY TIME:  Fluoroscopy Time:  2 minutes 51 seconds Radiation Exposure Index (if provided by the fluoroscopic device): Not provided Number of Acquired Spot Images: multiple fluoroscopic screen captures COMPARISON:  MRCP 12/02/2017 FINDINGS: Images demonstrate upper normal diameter CBD with smooth walls. A few small filling defects are seen at the distal CBD compatible with choledocholithiasis. Multiple small air bubbles are seen later in the exam within the distal CBD. Passage of a balloon catheter was made with clearance of filling defects. Endoscopic visualization confirmed removal of stones with balloon catheter passage. Last image demonstrates adequate drainage of contrast from the the CBD IMPRESSION: Choledocholithiasis. These images were submitted for radiologic interpretation only. Please see the procedural report for the amount of contrast and the fluoroscopy time  utilized. Electronically Signed   By: Ulyses Southward M.D.   On: 12/04/2017 14:19    Assessment: 72 year old male admitted with biliary pancreatitis secondary to choledocholithiasis, on chronic anticoagulation (Coumadin) prior to admission due to history of afib. He does have a history of bioprosthetic AVR without specific indication for anticoagulation,  but he has a history of cardioembolic event in 2011 so he will need heparin bridging if extended amount of time off of anticoagulation.   ERCP was completed 12/04/2017.  Findings of choledocholithiasis status post endoscopic sphincterotomy, sphincterotomy and balloon dilation and balloon stone extraction with complete removal of stones and gravel.  Noted slight irregularity of the distal bile duct related to sludge or possible edema related to pancreatitis.  Recommended clear liquid diet, may begin heparin infusion tomorrow as a bridging maneuver if clinically warranted.  Eventual cholecystectomy per surgery.  Check LFTs in the morning.  CBC found mildly depressed but stable hemoglobin at 10.4, normal platelets.  AST/ALT improved from 110/66-95/76.  Alkaline phosphatase of mild increased from 147-158.  Total bilirubin has declined significantly from 4.3-2.1 overnight.  Clinically he is improved today. Feels better, abdomen less distended, less pain, no N/V. Is up ambulating and up to chair. Still on clear liquids. Planned lap chole on Monday per surgery. Labs improving but not normalized yet.  Plan: 1. Monitor labs with LFTs to baseline 2. Will defer diet advancement to surgery due to pending lap chole (?Monday) 3. Continue supportive measures 4. We will sign off for now. Let us know if further assistance is needed.   Thank you for allowing [place] to participate in the care of [name]  Tyler Dust, DNP, AGNP-C Adult & Gerontological Nurse Practitioner [facility]    LOS: 3 days    12/05/2017, 2:11 PM

## 2017-12-05 NOTE — Anesthesia Postprocedure Evaluation (Signed)
Anesthesia Post Note  Patient: [name]  Procedure(s) Performed: ENDOSCOPIC RETROGRADE CHOLANGIOPANCREATOGRAPHY (ERCP) with stone extraction (N/A Esophagus) SPHINCTEROTOMY (N/A Esophagus) CRE BALLOON DILATION (N/A Esophagus)  Patient location during evaluation: Nursing Unit Anesthesia Type: General Level of consciousness: awake and alert and oriented Pain management: pain level controlled Vital Signs Assessment: post-procedure vital signs reviewed and stable Respiratory status: spontaneous breathing Cardiovascular status: stable Postop Assessment: no apparent nausea or vomiting and adequate PO intake Anesthetic complications: no     Last Vitals:  Vitals:   12/05/17 0300 12/05/17 0600  BP: 117/71   Pulse: (!) 45 96  Resp: 18   Temp: (!) 36.4 C   SpO2: 98%     Last Pain:  Vitals:   12/05/17 0552  TempSrc:   PainSc: 2                  Tzipora Mcinroy

## 2017-12-05 NOTE — Progress Notes (Signed)
PROGRESS NOTE    [name]  ZOX:[id number] DOB: [birth year] DOA: 12/02/2017 PCP: Benita StabileHall, John Z, MD     Brief Narrative:  72 year old man admitted from home on 1/22 due to abdominal pain and emesis.  He was found to have a lipase of over 3000 with elevated LFTs.  CT of the abdomen and pelvis showed cholelithiasis with gallbladder wall thickening with dry and extrahepatic ductal dilatation as well as common bile duct dilatation.  Admission requested, GI and surgical consults were requested.  Patient's procedures have been delayed due to iatrogenic coagulopathy as patient is anticoagulated on Coumadin for A. fib.  Has been seen by cardiology who has cleared patient for procedures.  Patient is status post ERCP on 1/24.  Cholecystectomy is pending.   Assessment & Plan:   Active Problems:   Preoperative cardiovascular examination   Gallstone pancreatitis   Choledocholithiasis   Atrial fibrillation, chronic (HCC)   S/P AVR   Abnormal liver function tests   S/P ERCP   Gallstone pancreatitis/choledocholithiasis/cholelithiasis -Status post ERCP on 1/24 with successful sphincterotomy and balloon stone extraction. -LFTs continue to improve post ERCP. -Continue Zosyn given cholelithiasis, plan for cholecystectomy this admission.  Chronic atrial fibrillation -Rate is currently controlled.   -Cholecystectomy was held today due to concerns from anesthesiology regarding rapid rates during ERCP. -Upon chart review it appears that on day of ERCP patient received only 1 dose of IV Lopressor instead of 2 due to soft blood pressures.  This in addition to stress from gallstone pancreatitis and procedure likely precipitated rapid rates. -In any case these have rapidly resolved, he has been placed back on his oral beta-blocker and seems to be tolerating fine with rates in the 70s-90s. -Has been seen by cardiology who agrees with plan to continue oral beta-blocker for now, will need to give IV beta-blocker  on day of surgery. -Coumadin has been on hold due to ongoing procedures. -Heparin drip was started yesterday, will go ahead and transition to Lovenox for ease of administration.  This can be held 12 hours prior to cholecystectomy.  History of coronary artery disease -No chest pain at present, stable  Hypertension -Well-controlled  History of bioprosthetic aortic valve replacement   DVT prophylaxis: Full dose anticoagulation with Lovenox  Code Status: Full code Family Communication: Patient only Disposition Plan: Pending cholecystectomy and medical improvement.  Consultants:   GI  Surgery  Procedures:   None  Antimicrobials:  Anti-infectives (From admission, onward)   Start     Dose/Rate Route Frequency Ordered Stop   12/05/17 0600  ceFAZolin (ANCEF) IVPB 2g/100 mL premix     2 g 200 mL/hr over 30 Minutes Intravenous On call to O.R. 12/04/17 1455 12/06/17 0559   12/02/17 1000  piperacillin-tazobactam (ZOSYN) IVPB 3.375 g     3.375 g 12.5 mL/hr over 240 Minutes Intravenous Every 8 hours 12/02/17 0947     12/02/17 0945  piperacillin-tazobactam (ZOSYN) IVPB 3.375 g  Status:  Discontinued     3.375 g 100 mL/hr over 30 Minutes Intravenous Every 8 hours 12/02/17 0941 12/02/17 0946       Subjective: Less abdominal pain, is hungry.  Concerned about lack of bowel movements  Objective: Vitals:   12/04/17 2150 12/05/17 0300 12/05/17 0600 12/05/17 2031  BP: 114/69 117/71  (!) 145/78  Pulse: (!) 101 (!) 45 96 72  Resp:  18  20  Temp:  (!) 97.5 F (36.4 C)  98.6 F (37 C)  TempSrc:  Oral  Oral  SpO2:  98%  93%  Weight:      Height:        Intake/Output Summary (Last 24 hours) at 12/05/2017 2204 Last data filed at 12/05/2017 1700 Gross per 24 hour  Intake 5260.01 ml  Output 900 ml  Net 4360.01 ml   Filed Weights   12/02/17 0459 12/02/17 0923  Weight: 88 kg (194 lb) 88 kg (194 lb 0.1 oz)    Examination:  General exam: Alert, awake, oriented x 3 Respiratory  system: Clear to auscultation. Respiratory effort normal. Cardiovascular system:RRR. No murmurs, rubs, gallops. Gastrointestinal system: Abdomen is distended, soft and tender to palpation to the right upper quadrant and epigastric area. No organomegaly or masses felt. Normal bowel sounds heard. Central nervous system: Alert and oriented. No focal neurological deficits. Extremities: No C/C/E, +pedal pulses Skin: No rashes, lesions or ulcers Psychiatry: Judgement and insight appear normal. Mood & affect appropriate.     Data Reviewed: I have personally reviewed following labs and imaging studies  CBC: Recent Labs  Lab 12/02/17 0517 12/03/17 0435 12/04/17 0536 12/05/17 0617  WBC 8.4 8.1 6.0 8.9  NEUTROABS 7.7  --  5.0  --   HGB 13.2 11.1* 10.7* 10.4*  HCT 39.0 34.6* 33.2* 32.5*  MCV 101.3* 105.8* 106.4* 106.6*  PLT 221 169 145* 190   Basic Metabolic Panel: Recent Labs  Lab 12/02/17 0517 12/03/17 0435 12/04/17 0536 12/05/17 0617  NA 135 139 137 134*  K 3.5 4.8 4.2 4.4  CL 101 106 107 104  CO2 21* 22 20* 20*  GLUCOSE 161* 111* 105* 136*  BUN 20 24* 23* 26*  CREATININE 1.09 1.39* 1.06 1.12  CALCIUM 9.1 7.9* 8.0* 8.0*  MG  --   --  1.6*  --    GFR: Estimated Creatinine Clearance: 66.6 mL/min (by C-G formula based on SCr of 1.12 mg/dL). Liver Function Tests: Recent Labs  Lab 12/02/17 0517 12/03/17 0435 12/04/17 0536 12/05/17 0617  AST 250* 84* 110* 96*  95*  ALT 86* 67* 66* 78*  76*  ALKPHOS 277* 148* 147* 159*  158*  BILITOT 1.3* 1.5* 4.3* 2.0*  2.1*  PROT 7.4 6.5 6.9 6.9  6.8  ALBUMIN 3.5 3.0* 2.9* 2.9*  2.9*   Recent Labs  Lab 12/02/17 0517 12/03/17 0900 12/04/17 0536  LIPASE 3,298* 1,375* 329*   No results for input(s): AMMONIA in the last 168 hours. Coagulation Profile: Recent Labs  Lab 12/02/17 0517 12/03/17 0435 12/04/17 0536  INR 2.14 1.98 1.30   Cardiac Enzymes: No results for input(s): CKTOTAL, CKMB, CKMBINDEX, TROPONINI in the last  168 hours. BNP (last 3 results) No results for input(s): PROBNP in the last 8760 hours. HbA1C: No results for input(s): HGBA1C in the last 72 hours. CBG: No results for input(s): GLUCAP in the last 168 hours. Lipid Profile: No results for input(s): CHOL, HDL, LDLCALC, TRIG, CHOLHDL, LDLDIRECT in the last 72 hours. Thyroid Function Tests: No results for input(s): TSH, T4TOTAL, FREET4, T3FREE, THYROIDAB in the last 72 hours. Anemia Panel: No results for input(s): VITAMINB12, FOLATE, FERRITIN, TIBC, IRON, RETICCTPCT in the last 72 hours. Urine analysis:    Component Value Date/Time   COLORURINE YELLOW 12/02/2017 2335   APPEARANCEUR CLEAR 12/02/2017 2335   LABSPEC 1.036 (H) 12/02/2017 2335   PHURINE 5.0 12/02/2017 2335   GLUCOSEU NEGATIVE 12/02/2017 2335   HGBUR SMALL (A) 12/02/2017 2335   BILIRUBINUR NEGATIVE 12/02/2017 2335   KETONESUR NEGATIVE 12/02/2017 2335   PROTEINUR NEGATIVE 12/02/2017 2335  UROBILINOGEN 1.0 07/14/2012 1243   NITRITE NEGATIVE 12/02/2017 2335   LEUKOCYTESUR NEGATIVE 12/02/2017 2335   Sepsis Labs: @LABRCNTIP (procalcitonin:4,lacticidven:4)  )No results found for this or any previous visit (from the past 240 hour(s)).       Radiology Studies: Dg Ercp Biliary & Pancreatic Ducts  Result Date: 12/04/2017 CLINICAL DATA:  Choledocholithiasis EXAM: ERCP WITH SPHINCTEROTOMY TECHNIQUE: Multiple spot images obtained with the fluoroscopic device and submitted for interpretation post-procedure. FLUOROSCOPY TIME:  Fluoroscopy Time:  2 minutes 51 seconds Radiation Exposure Index (if provided by the fluoroscopic device): Not provided Number of Acquired Spot Images: multiple fluoroscopic screen captures COMPARISON:  MRCP 12/02/2017 FINDINGS: Images demonstrate upper normal diameter CBD with smooth walls. A few small filling defects are seen at the distal CBD compatible with choledocholithiasis. Multiple small air bubbles are seen later in the exam within the distal CBD.  Passage of a balloon catheter was made with clearance of filling defects. Endoscopic visualization confirmed removal of stones with balloon catheter passage. Last image demonstrates adequate drainage of contrast from the the CBD IMPRESSION: Choledocholithiasis. These images were submitted for radiologic interpretation only. Please see the procedural report for the amount of contrast and the fluoroscopy time utilized. Electronically Signed   By: Ulyses Southward M.D.   On: 12/04/2017 14:19        Scheduled Meds: . enoxaparin (LOVENOX) injection  90 mg Subcutaneous Q12H  . metoprolol tartrate  50 mg Oral BID  . polyethylene glycol  17 g Oral Daily   Continuous Infusions: . 0.9 % NaCl with KCl 20 mEq / L 75 mL/hr at 12/05/17 1243  .  ceFAZolin (ANCEF) IV    . piperacillin-tazobactam (ZOSYN)  IV 3.375 g (12/05/17 1647)     LOS: 3 days    Time spent: 25 minutes. Greater than 50% of this time was spent in direct contact with the patient coordinating care.     Chaya Jan, MD [facility] Pager [phone]  If 7PM-7AM, please contact night-coverage [email or web address] Password [facility] 12/05/2017, 10:04 PM

## 2017-12-05 NOTE — Progress Notes (Signed)
[facility] Progress Note  1 Day Post-Op  Subjective: Patient had ERCP yesterday, HR to 190-200s during the procedure per report, the anesthesia record has documented HR to 170s.  Looks like lopressor 2.5 q6 IV was ordered and his home dose is lopressor 50 BID, so was likely under dosed.  Have scheduled patient for Monday.   Feels less bloated.   Objective: Vital signs in last 24 hours: Temp:  [97.5 F (36.4 C)-98 F (36.7 C)] 97.5 F (36.4 C) (01/25 0300) Pulse Rate:  [45-114] 96 (01/25 0600) Resp:  [14-30] 18 (01/25 0300) BP: (91-126)/(64-82) 117/71 (01/25 0300) SpO2:  [89 %-100 %] 98 % (01/25 0300) Last BM Date: 12/01/17  Intake/Output from previous day: 01/24 0701 - 01/25 0700 In: 1930.6 [P.O.:1270; I.V.:510.6; IV Piggyback:150] Out: 300 [Urine:300] Intake/Output this shift: No intake/output data recorded.  General appearance: alert, cooperative and little confused, just awoke from nap Resp: normal work breathing GI: soft, less distended, minimally tender Extremities: extremities normal, atraumatic, no cyanosis or edema  Lab Results:  Recent Labs    12/04/17 0536 12/05/17 0617  WBC 6.0 8.9  HGB 10.7* 10.4*  HCT 33.2* 32.5*  PLT 145* 190   BMET Recent Labs    12/04/17 0536 12/05/17 0617  NA 137 134*  K 4.2 4.4  CL 107 104  CO2 20* 20*  GLUCOSE 105* 136*  BUN 23* 26*  CREATININE 1.06 1.12  CALCIUM 8.0* 8.0*   PT/INR Recent Labs    12/03/17 0435 12/04/17 0536  LABPROT 22.4* 16.1*  INR 1.98 1.30    Studies/Results: Dg Ercp Biliary & Pancreatic Ducts  Result Date: 12/04/2017 CLINICAL DATA:  Choledocholithiasis EXAM: ERCP WITH SPHINCTEROTOMY TECHNIQUE: Multiple spot images obtained with the fluoroscopic device and submitted for interpretation post-procedure. FLUOROSCOPY TIME:  Fluoroscopy Time:  2 minutes 51 seconds Radiation Exposure Index (if provided by the fluoroscopic device): Not provided Number of Acquired Spot Images:  multiple fluoroscopic screen captures COMPARISON:  MRCP 12/02/2017 FINDINGS: Images demonstrate upper normal diameter CBD with smooth walls. A few small filling defects are seen at the distal CBD compatible with choledocholithiasis. Multiple small air bubbles are seen later in the exam within the distal CBD. Passage of a balloon catheter was made with clearance of filling defects. Endoscopic visualization confirmed removal of stones with balloon catheter passage. Last image demonstrates adequate drainage of contrast from the the CBD IMPRESSION: Choledocholithiasis. These images were submitted for radiologic interpretation only. Please see the procedural report for the amount of contrast and the fluoroscopy time utilized. Electronically Signed   By: Ulyses Southward M.D.   On: 12/04/2017 14:19    Anti-infectives: Anti-infectives (From admission, onward)   Start     Dose/Rate Route Frequency Ordered Stop   12/05/17 0600  ceFAZolin (ANCEF) IVPB 2g/100 mL premix     2 g 200 mL/hr over 30 Minutes Intravenous On call to O.R. 12/04/17 1455 12/06/17 0559   12/02/17 1000  piperacillin-tazobactam (ZOSYN) IVPB 3.375 g     3.375 g 12.5 mL/hr over 240 Minutes Intravenous Every 8 hours 12/02/17 0947     12/02/17 0945  piperacillin-tazobactam (ZOSYN) IVPB 3.375 g  Status:  Discontinued     3.375 g 100 mL/hr over 30 Minutes Intravenous Every 8 hours 12/02/17 0941 12/02/17 0946      Assessment/Plan: Mr. Damiano is a 72 yo with gallstone pancreatitis who has underwent an ERCP with some transient increase in his rate during the procedure. Likely his beta blocker was under dosed some.  Less distended this AM.  -Plan for OR Monday -Dr. Wyline MoodBranch seeing patient today too -Ordered pulmonary toilet, IS given Dr. Georgann HousekeeperGonzalez's statements that patient had some mucus with the ET tube    LOS: 3 days    Lucretia RoersLindsay C Bridges 12/05/2017

## 2017-12-06 ENCOUNTER — Inpatient Hospital Stay (HOSPITAL_COMMUNITY): Payer: Medicare Other

## 2017-12-06 DIAGNOSIS — K567 Ileus, unspecified: Secondary | ICD-10-CM

## 2017-12-06 LAB — HEPATIC FUNCTION PANEL
ALBUMIN: 2.9 g/dL — AB (ref 3.5–5.0)
ALT: 61 U/L (ref 17–63)
AST: 44 U/L — AB (ref 15–41)
Alkaline Phosphatase: 141 U/L — ABNORMAL HIGH (ref 38–126)
Bilirubin, Direct: 0.7 mg/dL — ABNORMAL HIGH (ref 0.1–0.5)
Indirect Bilirubin: 1 mg/dL — ABNORMAL HIGH (ref 0.3–0.9)
TOTAL PROTEIN: 7.2 g/dL (ref 6.5–8.1)
Total Bilirubin: 1.7 mg/dL — ABNORMAL HIGH (ref 0.3–1.2)

## 2017-12-06 LAB — CBC
HEMATOCRIT: 34 % — AB (ref 39.0–52.0)
HEMOGLOBIN: 10.8 g/dL — AB (ref 13.0–17.0)
MCH: 34.3 pg — ABNORMAL HIGH (ref 26.0–34.0)
MCHC: 31.8 g/dL (ref 30.0–36.0)
MCV: 107.9 fL — AB (ref 78.0–100.0)
Platelets: 238 10*3/uL (ref 150–400)
RBC: 3.15 MIL/uL — AB (ref 4.22–5.81)
RDW: 13.9 % (ref 11.5–15.5)
WBC: 11.8 10*3/uL — ABNORMAL HIGH (ref 4.0–10.5)

## 2017-12-06 LAB — LIPASE, BLOOD: Lipase: 79 U/L — ABNORMAL HIGH (ref 11–51)

## 2017-12-06 LAB — HEPARIN LEVEL (UNFRACTIONATED): Heparin Unfractionated: 0.36 IU/mL (ref 0.30–0.70)

## 2017-12-06 LAB — SURGICAL PCR SCREEN
MRSA, PCR: NEGATIVE
STAPHYLOCOCCUS AUREUS: NEGATIVE

## 2017-12-06 MED ORDER — HYDROMORPHONE HCL 1 MG/ML IJ SOLN
0.5000 mg | INTRAMUSCULAR | Status: DC | PRN
  Administered 2017-12-06 – 2017-12-08 (×10): 0.5 mg via INTRAVENOUS
  Filled 2017-12-06 (×10): qty 1

## 2017-12-06 MED ORDER — BISACODYL 10 MG RE SUPP
10.0000 mg | Freq: Once | RECTAL | Status: AC
  Administered 2017-12-06: 10 mg via RECTAL
  Filled 2017-12-06: qty 1

## 2017-12-06 MED ORDER — METOPROLOL TARTRATE 5 MG/5ML IV SOLN
5.0000 mg | Freq: Three times a day (TID) | INTRAVENOUS | Status: DC
  Administered 2017-12-06 – 2017-12-08 (×6): 5 mg via INTRAVENOUS
  Filled 2017-12-06 (×6): qty 5

## 2017-12-06 NOTE — Progress Notes (Signed)
Pt had large BM after dulcolax suppository given.

## 2017-12-06 NOTE — Progress Notes (Signed)
Patient complains of  worsening abdominal distention over the past 24 hours. Little results from a dose of MiraLAX yesterday.  Patient reports abdominal pain much better over that at the time of admission.  Plain film today demonstrates some gaseous distention of both the colon and small bowel. Some residual stool in left colon. No free air.  White count 11.8, hemoglobin 10.8, lipase 79, total bilirubin 1.7 AST/ALT 4461.  Continues to receive dilaudid regularly  Vital signs in last 24 hours: Temp:  [97.5 F (36.4 C)-98.6 F (37 C)] 97.5 F (36.4 C) (01/26 1438) Pulse Rate:  [72-115] 115 (01/26 1438) Resp:  [18-20] 18 (01/26 1438) BP: (129-145)/(69-78) 129/73 (01/26 1438) SpO2:  [93 %-97 %] 93 % (01/26 1438) Last BM Date: 12/01/17 General:   Alert,  Well-developed, well-nourished, pleasant and cooperative in NAD Abdomen: Distended. Positive bowel sounds. Tympanitic soft with minimal diffuse tenderness to palpation. Extremities:  Without clubbing or edema.    Intake/Output from previous day: 01/25 0701 - 01/26 0700 In: 5660.6 [P.O.:1800; I.V.:3660.6; IV Piggyback:200] Out: 900 [Urine:900] Intake/Output this shift: Total I/O In: 695 [I.V.:645; IV Piggyback:50] Out: -   Lab Results: Recent Labs    12/04/17 0536 12/05/17 0617 12/06/17 0730  WBC 6.0 8.9 11.8*  HGB 10.7* 10.4* 10.8*  HCT 33.2* 32.5* 34.0*  PLT 145* 190 238   BMET Recent Labs    12/04/17 0536 12/05/17 0617  NA 137 134*  K 4.2 4.4  CL 107 104  CO2 20* 20*  GLUCOSE 105* 136*  BUN 23* 26*  CREATININE 1.06 1.12  CALCIUM 8.0* 8.0*   LFT Recent Labs    12/06/17 0730  PROT 7.2  ALBUMIN 2.9*  AST 44*  ALT 61  ALKPHOS 141*  BILITOT 1.7*  BILIDIR 0.7*  IBILI 1.0*   PT/INR Recent Labs    12/04/17 0536  LABPROT 16.1*  INR 1.30   Hepatitis Panel No results for input(s): HEPBSAG, HCVAB, HEPAIGM, HEPBIGM in the last 72 hours. C-Diff No results for input(s): CDIFFTOX in the last 72  hours.  Studies/Results: Dg Abd Portable 1v  Result Date: 12/06/2017 CLINICAL DATA:  Nausea, abdominal distension, recent ERCP and sphincterotomy with extraction of CBD stones EXAM: PORTABLE ABDOMEN - 1 VIEW COMPARISON:  Portable exam 1042 hours compared to 12/02/2017 FINDINGS: Air-filled small bowel loops throughout abdomen. Gaseous distention of transverse colon up to 13.2 cm diameter. No definite bowel wall thickening. Bones demineralized with degenerative disc and facet disease changes of the lumbar spine. IMPRESSION: Gaseous distention of colon up to 13.2 cm diameter. Additional air-filled upper normal caliber small bowel loops. Electronically Signed   By: Ulyses SouthwardMark  Boles M.D.   On: 12/06/2017 12:19    Impression:  Biliary pancreatitis. Status post ERCP with sphincterotomy and decompression of the biliary tree. Patient now with an ileus in the setting of ongoing narcotic therapy.  Recommendations: NG tube placed for decompression.  Decrease Dilaudid to 0.5 mg IV every 3 hours.  Patient to take only as needed for pain. Dulcolax suppository.

## 2017-12-06 NOTE — Progress Notes (Signed)
PROGRESS NOTE    Clovia Cufflbert J Winther  ZOX:[id number] DOB: 1946-05-19 DOA: 12/02/2017 PCP: Benita StabileHall, John Z, MD     Brief Narrative:  72 year old man admitted from home on 1/22 due to abdominal pain and emesis.  He was found to have a lipase of over 3000 with elevated LFTs.  CT of the abdomen and pelvis showed cholelithiasis with gallbladder wall thickening with dry and extrahepatic ductal dilatation as well as common bile duct dilatation.  Admission requested, GI and surgical consults were requested.  Patient's procedures have been delayed due to iatrogenic coagulopathy as patient is anticoagulated on Coumadin for A. fib.  Has been seen by cardiology who has cleared patient for procedures.  Patient is status post ERCP on 1/24.  Cholecystectomy is pending. Has increased abdominal distention and an ileus on xray.   Assessment & Plan:   Active Problems:   Preoperative cardiovascular examination   Gallstone pancreatitis   Choledocholithiasis   Atrial fibrillation, chronic (HCC)   S/P AVR   Abnormal liver function tests   S/P ERCP   Ileus (HCC)   Gallstone pancreatitis/choledocholithiasis/cholelithiasis -Status post ERCP on 1/24 with successful sphincterotomy and balloon stone extraction. -LFTs continue to improve post ERCP. -Continue Zosyn given cholelithiasis, plan for cholecystectomy this admission.  Chronic atrial fibrillation -Rate is currently controlled.   -Continue lovenox for anticoagulation purposes. -Will transition to IV lopressor as he is now NPO and has an NG tube.  Ileus -Likely post-ERCP and 2/2 narcotic use. -GI has seen and has mage NPO and has placed an NG tube.  History of coronary artery disease -No chest pain at present, stable  Hypertension -Well-controlled  History of bioprosthetic aortic valve replacement   DVT prophylaxis: Full dose anticoagulation with Lovenox  Code Status: Full code Family Communication: Patient only Disposition Plan: Pending  cholecystectomy and medical improvement.  Consultants:   GI  Surgery  Procedures:   None  Antimicrobials:  Anti-infectives (From admission, onward)   Start     Dose/Rate Route Frequency Ordered Stop   12/05/17 0600  ceFAZolin (ANCEF) IVPB 2g/100 mL premix     2 g 200 mL/hr over 30 Minutes Intravenous On call to O.R. 12/04/17 1455 12/06/17 0559   12/02/17 1000  piperacillin-tazobactam (ZOSYN) IVPB 3.375 g     3.375 g 12.5 mL/hr over 240 Minutes Intravenous Every 8 hours 12/02/17 0947     12/02/17 0945  piperacillin-tazobactam (ZOSYN) IVPB 3.375 g  Status:  Discontinued     3.375 g 100 mL/hr over 30 Minutes Intravenous Every 8 hours 12/02/17 0941 12/02/17 0946       Subjective: Less abdominal pain, is hungry.  Concerned about lack of bowel movements, states abdomen is getting more distended  Objective: Vitals:   12/05/17 0600 12/05/17 2031 12/06/17 0652 12/06/17 1438  BP:  (!) 145/78 140/69 129/73  Pulse: 96 72 99 (!) 115  Resp:  20 19 18   Temp:  98.6 F (37 C) 97.7 F (36.5 C) (!) 97.5 F (36.4 C)  TempSrc:  Oral Oral   SpO2:  93% 97% 93%  Weight:      Height:        Intake/Output Summary (Last 24 hours) at 12/06/2017 1658 Last data filed at 12/06/2017 1554 Gross per 24 hour  Intake 2688.75 ml  Output 500 ml  Net 2188.75 ml   Filed Weights   12/02/17 0459 12/02/17 0923  Weight: 88 kg (194 lb) 88 kg (194 lb 0.1 oz)    Examination:  General exam: Alert,  awake, oriented x 3 Respiratory system: Clear to auscultation. Respiratory effort normal. Cardiovascular system:RRR. No murmurs, rubs, gallops. Gastrointestinal system: Abdomen is distended, soft and tender to palpation to the right upper quadrant and epigastric area. No organomegaly or masses felt. Decreased bowel sounds heard. Central nervous system: Alert and oriented. No focal neurological deficits. Extremities: No C/C/E, +pedal pulses Skin: No rashes, lesions or ulcers Psychiatry: Judgement and  insight appear normal. Mood & affect appropriate.     Data Reviewed: I have personally reviewed following labs and imaging studies  CBC: Recent Labs  Lab 12/02/17 0517 12/03/17 0435 12/04/17 0536 12/05/17 0617 12/06/17 0730  WBC 8.4 8.1 6.0 8.9 11.8*  NEUTROABS 7.7  --  5.0  --   --   HGB 13.2 11.1* 10.7* 10.4* 10.8*  HCT 39.0 34.6* 33.2* 32.5* 34.0*  MCV 101.3* 105.8* 106.4* 106.6* 107.9*  PLT 221 169 145* 190 238   Basic Metabolic Panel: Recent Labs  Lab 12/02/17 0517 12/03/17 0435 12/04/17 0536 12/05/17 0617  NA 135 139 137 134*  K 3.5 4.8 4.2 4.4  CL 101 106 107 104  CO2 21* 22 20* 20*  GLUCOSE 161* 111* 105* 136*  BUN 20 24* 23* 26*  CREATININE 1.09 1.39* 1.06 1.12  CALCIUM 9.1 7.9* 8.0* 8.0*  MG  --   --  1.6*  --    GFR: Estimated Creatinine Clearance: 66.6 mL/min (by C-G formula based on SCr of 1.12 mg/dL). Liver Function Tests: Recent Labs  Lab 12/02/17 0517 12/03/17 0435 12/04/17 0536 12/05/17 0617 12/06/17 0730  AST 250* 84* 110* 96*  95* 44*  ALT 86* 67* 66* 78*  76* 61  ALKPHOS 277* 148* 147* 159*  158* 141*  BILITOT 1.3* 1.5* 4.3* 2.0*  2.1* 1.7*  PROT 7.4 6.5 6.9 6.9  6.8 7.2  ALBUMIN 3.5 3.0* 2.9* 2.9*  2.9* 2.9*   Recent Labs  Lab 12/02/17 0517 12/03/17 0900 12/04/17 0536 12/06/17 0730  LIPASE 3,298* 1,375* 329* 79*   No results for input(s): AMMONIA in the last 168 hours. Coagulation Profile: Recent Labs  Lab 12/02/17 0517 12/03/17 0435 12/04/17 0536  INR 2.14 1.98 1.30   Cardiac Enzymes: No results for input(s): CKTOTAL, CKMB, CKMBINDEX, TROPONINI in the last 168 hours. BNP (last 3 results) No results for input(s): PROBNP in the last 8760 hours. HbA1C: No results for input(s): HGBA1C in the last 72 hours. CBG: No results for input(s): GLUCAP in the last 168 hours. Lipid Profile: No results for input(s): CHOL, HDL, LDLCALC, TRIG, CHOLHDL, LDLDIRECT in the last 72 hours. Thyroid Function Tests: No results for  input(s): TSH, T4TOTAL, FREET4, T3FREE, THYROIDAB in the last 72 hours. Anemia Panel: No results for input(s): VITAMINB12, FOLATE, FERRITIN, TIBC, IRON, RETICCTPCT in the last 72 hours. Urine analysis:    Component Value Date/Time   COLORURINE YELLOW 12/02/2017 2335   APPEARANCEUR CLEAR 12/02/2017 2335   LABSPEC 1.036 (H) 12/02/2017 2335   PHURINE 5.0 12/02/2017 2335   GLUCOSEU NEGATIVE 12/02/2017 2335   HGBUR SMALL (A) 12/02/2017 2335   BILIRUBINUR NEGATIVE 12/02/2017 2335   KETONESUR NEGATIVE 12/02/2017 2335   PROTEINUR NEGATIVE 12/02/2017 2335   UROBILINOGEN 1.0 07/14/2012 1243   NITRITE NEGATIVE 12/02/2017 2335   LEUKOCYTESUR NEGATIVE 12/02/2017 2335   Sepsis Labs: @LABRCNTIP (procalcitonin:4,lacticidven:4)  ) Recent Results (from the past 240 hour(s))  Surgical pcr screen     Status: None   Collection Time: 12/06/17 10:01 AM  Result Value Ref Range Status   MRSA, PCR NEGATIVE NEGATIVE  Final   Staphylococcus aureus NEGATIVE NEGATIVE Final    Comment: (NOTE) The Xpert SA Assay (FDA approved for NASAL specimens in patients 74 years of age and older), is one component of a comprehensive surveillance program. It is not intended to diagnose infection nor to guide or monitor treatment.          Radiology Studies: Dg Chest 1 View  Result Date: 12/06/2017 CLINICAL DATA:  Nasogastric tube placement EXAM: CHEST 1 VIEW COMPARISON:  4 days prior FINDINGS: Nasogastric tube tip reaches the stomach with side port at the GE junction. Low lung volumes with bandlike opacity at the left base. Cardiomegaly. History of aortic valve replacement. No Kerley lines, effusion, or pneumothorax. IMPRESSION: 1. Nasogastric tube with tip at the proximal stomach. 2. Atelectasis at the left base. Electronically Signed   By: Marnee Spring M.D.   On: 12/06/2017 15:03   Dg Abd Portable 1v  Result Date: 12/06/2017 CLINICAL DATA:  Nausea, abdominal distension, recent ERCP and sphincterotomy with  extraction of CBD stones EXAM: PORTABLE ABDOMEN - 1 VIEW COMPARISON:  Portable exam 1042 hours compared to 12/02/2017 FINDINGS: Air-filled small bowel loops throughout abdomen. Gaseous distention of transverse colon up to 13.2 cm diameter. No definite bowel wall thickening. Bones demineralized with degenerative disc and facet disease changes of the lumbar spine. IMPRESSION: Gaseous distention of colon up to 13.2 cm diameter. Additional air-filled upper normal caliber small bowel loops. Electronically Signed   By: Ulyses Southward M.D.   On: 12/06/2017 12:19        Scheduled Meds: . enoxaparin (LOVENOX) injection  90 mg Subcutaneous Q12H  . metoprolol tartrate  50 mg Oral BID  . polyethylene glycol  17 g Oral Daily   Continuous Infusions: . 0.9 % NaCl with KCl 20 mEq / L 75 mL/hr at 12/06/17 1553  . piperacillin-tazobactam (ZOSYN)  IV Stopped (12/06/17 1136)     LOS: 4 days    Time spent: 25 minutes. Greater than 50% of this time was spent in direct contact with the patient coordinating care.     Chaya Jan, MD [facility] Pager [phone]  If 7PM-7AM, please contact night-coverage [email or web address] Password [facility] 12/06/2017, 4:58 PM

## 2017-12-06 NOTE — Progress Notes (Signed)
Patient requested another dulcolax suppository due to inability to have a bowel movement.  On call MD notified and new order received and carried out.  Will continue to monitor.

## 2017-12-06 NOTE — Progress Notes (Signed)
NG tube inserted without difficulty. Confirmed via chest xray. Small amount green emesis returned upon insertion of tube. Patient tolerated procedure well.

## 2017-12-07 ENCOUNTER — Inpatient Hospital Stay (HOSPITAL_COMMUNITY): Payer: Medicare Other

## 2017-12-07 LAB — COMPREHENSIVE METABOLIC PANEL
ALT: 41 U/L (ref 17–63)
AST: 26 U/L (ref 15–41)
Albumin: 2.6 g/dL — ABNORMAL LOW (ref 3.5–5.0)
Alkaline Phosphatase: 113 U/L (ref 38–126)
Anion gap: 12 (ref 5–15)
BUN: 23 mg/dL — ABNORMAL HIGH (ref 6–20)
CHLORIDE: 109 mmol/L (ref 101–111)
CO2: 21 mmol/L — AB (ref 22–32)
CREATININE: 0.97 mg/dL (ref 0.61–1.24)
Calcium: 8.8 mg/dL — ABNORMAL LOW (ref 8.9–10.3)
GFR calc Af Amer: >60 mL/min (ref >60)
GFR calc non Af Amer: >60 mL/min (ref >60)
Glucose, Bld: 84 mg/dL (ref 65–99)
POTASSIUM: 4.6 mmol/L (ref 3.5–5.1)
SODIUM: 142 mmol/L (ref 135–145)
Total Bilirubin: 1.7 mg/dL — ABNORMAL HIGH (ref 0.3–1.2)
Total Protein: 6.6 g/dL (ref 6.5–8.1)

## 2017-12-07 LAB — CBC
HEMATOCRIT: 32 % — AB (ref 39.0–52.0)
Hemoglobin: 10.3 g/dL — ABNORMAL LOW (ref 13.0–17.0)
MCH: 34.7 pg — AB (ref 26.0–34.0)
MCHC: 32.2 g/dL (ref 30.0–36.0)
MCV: 107.7 fL — AB (ref 78.0–100.0)
PLATELETS: 226 10*3/uL (ref 150–400)
RBC: 2.97 MIL/uL — ABNORMAL LOW (ref 4.22–5.81)
RDW: 13.8 % (ref 11.5–15.5)
WBC: 10 10*3/uL (ref 4.0–10.5)

## 2017-12-07 LAB — PHOSPHORUS: Phosphorus: 2.5 mg/dL (ref 2.5–4.6)

## 2017-12-07 LAB — MAGNESIUM: MAGNESIUM: 1.9 mg/dL (ref 1.7–2.4)

## 2017-12-07 MED ORDER — MAGNESIUM SULFATE 2 GM/50ML IV SOLN
2.0000 g | Freq: Once | INTRAVENOUS | Status: AC
  Administered 2017-12-07: 2 g via INTRAVENOUS
  Filled 2017-12-07: qty 50

## 2017-12-07 NOTE — Progress Notes (Signed)
Patient's NG tube clamped for ambulation.  Patient ambulated approximately 75 feet.  Tolerated well.  Patient returned to room and had small bowel movement.  Patient's NG tube hooked to suction low intermittent.  Patient resting in bed.  Wife at bedside.

## 2017-12-07 NOTE — Progress Notes (Signed)
[facility] Progress Note  3 Days Post-Op  Subjective: Very distended, NG placed yesterday. KUB with colonic distention. Feels bloated. No other pain complaints.   Objective: Vital signs in last 24 hours: Temp:  [89.2 F (31.8 C)-97.5 F (36.4 C)] 89.2 F (31.8 C) (01/27 0500) Pulse Rate:  [102-115] 102 (01/27 0500) Resp:  [18] 18 (01/27 0500) BP: (121-129)/(73-81) 125/81 (01/27 0500) SpO2:  [93 %-96 %] 95 % (01/27 0500) Last BM Date: 12/07/17  Intake/Output from previous day: 01/26 0701 - 01/27 0700 In: 2181.3 [I.V.:2031.3; IV Piggyback:150] Out: 900 [Urine:300; Emesis/NG output:600] Intake/Output this shift: No intake/output data recorded.  General appearance: alert, cooperative and no distress Resp: normal work breathing GI: tympanic, soft, minimally tender  Lab Results:  Recent Labs    12/06/17 0730 12/07/17 0551  WBC 11.8* 10.0  HGB 10.8* 10.3*  HCT 34.0* 32.0*  PLT 238 226   BMET Recent Labs    12/05/17 0617 12/07/17 0551  NA 134* 142  K 4.4 4.6  CL 104 109  CO2 20* 21*  GLUCOSE 136* 84  BUN 26* 23*  CREATININE 1.12 0.97  CALCIUM 8.0* 8.8*    Studies/Results: Dg Chest 1 View  Result Date: 12/06/2017 CLINICAL DATA:  Nasogastric tube placement EXAM: CHEST 1 VIEW COMPARISON:  4 days prior FINDINGS: Nasogastric tube tip reaches the stomach with side port at the GE junction. Low lung volumes with bandlike opacity at the left base. Cardiomegaly. History of aortic valve replacement. No Kerley lines, effusion, or pneumothorax. IMPRESSION: 1. Nasogastric tube with tip at the proximal stomach. 2. Atelectasis at the left base. Electronically Signed   By: Marnee SpringJonathon  Watts M.D.   On: 12/06/2017 15:03   Dg Abd Portable 1v  Result Date: 12/06/2017 CLINICAL DATA:  Nausea, abdominal distension, recent ERCP and sphincterotomy with extraction of CBD stones EXAM: PORTABLE ABDOMEN - 1 VIEW COMPARISON:  Portable exam 1042 hours compared to 12/02/2017  FINDINGS: Air-filled small bowel loops throughout abdomen. Gaseous distention of transverse colon up to 13.2 cm diameter. No definite bowel wall thickening. Bones demineralized with degenerative disc and facet disease changes of the lumbar spine. IMPRESSION: Gaseous distention of colon up to 13.2 cm diameter. Additional air-filled upper normal caliber small bowel loops. Electronically Signed   By: Ulyses SouthwardMark  Boles M.D.   On: 12/06/2017 12:19    Anti-infectives: Anti-infectives (From admission, onward)   Start     Dose/Rate Route Frequency Ordered Stop   12/05/17 0600  ceFAZolin (ANCEF) IVPB 2g/100 mL premix     2 g 200 mL/hr over 30 Minutes Intravenous On call to O.R. 12/04/17 1455 12/06/17 0559   12/02/17 1000  piperacillin-tazobactam (ZOSYN) IVPB 3.375 g     3.375 g 12.5 mL/hr over 240 Minutes Intravenous Every 8 hours 12/02/17 0947     12/02/17 0945  piperacillin-tazobactam (ZOSYN) IVPB 3.375 g  Status:  Discontinued     3.375 g 100 mL/hr over 30 Minutes Intravenous Every 8 hours 12/02/17 0941 12/02/17 0946      Assessment/Plan: [name] is a 72 yo with a history of gallstone pancreatitis s/p ERCP who now has an ileus with a large component being a colonic ileus. He has had NG output and remains distended. Minimal flatus reported. Plans to get up an walk. NPO, NG Metoprolol 5q8 ordered now since back NPO (higher than what he was on initially preoperatively);   RN needs to get his AM metoprolol dose and not hold for OR reasons; we have patient's take their  beta blocker prior to all procedures   Will procedure like he will possibly get surgery tomorrow but unlikely given the distention; I have ordered KUB today and for the AM to see interval changes.   Replace lytes with K to 4, Phos to 3, Mg to 2; added on Mg and Phos to labs and BMP for tomorrow   Will cancel in the AM once see patient and KUB.    Discussed with Dr. Ardyth Harps and Dr. Jena Gauss. Dr. Jena Gauss questioning maybe using neostigmine  given the colonic ileus component, and potentially not a bad idea, but agree would need to be under a monitored situation with cardiology available.    LOS: 5 days    [name] 12/07/2017

## 2017-12-07 NOTE — Progress Notes (Signed)
Rectal tube removed. Gas expelled. PT tolerated well denies need to have a BM.

## 2017-12-07 NOTE — Progress Notes (Signed)
Late entry:  Patient ambulated in hallway.  Returned to bed.  NG tube repositioned.  Patient on left side with rectal tube in place.

## 2017-12-07 NOTE — Progress Notes (Signed)
PROGRESS NOTE    [name]  ZOX:[id number] DOB: 1946/01/09 DOA: 12/02/2017 PCP: Benita Stabile, MD     Brief Narrative:  72 year old man admitted from home on 1/22 due to abdominal pain and emesis.  He was found to have a lipase of over 3000 with elevated LFTs.  CT of the abdomen and pelvis showed cholelithiasis with gallbladder wall thickening with dry and extrahepatic ductal dilatation as well as common bile duct dilatation.  Admission requested, GI and surgical consults were requested.  Patient's procedures have been delayed due to iatrogenic coagulopathy as patient is anticoagulated on Coumadin for A. fib.  Has been seen by cardiology who has cleared patient for procedures.  Patient is status post ERCP on 1/24.  Cholecystectomy is pending. Has increased abdominal distention and an ileus on xray.   Assessment & Plan:   Active Problems:   Preoperative cardiovascular examination   Gallstone pancreatitis   Choledocholithiasis   Atrial fibrillation, chronic (HCC)   S/P AVR   Abnormal liver function tests   S/P ERCP   Ileus (HCC)   Gallstone pancreatitis/choledocholithiasis/cholelithiasis -Status post ERCP on 1/24 with successful sphincterotomy and balloon stone extraction. -LFTs continue to improve post ERCP. -Continue Zosyn given cholelithiasis, plan for cholecystectomy this admission.  Chronic atrial fibrillation -Rate is currently controlled.   -Continue lovenox for anticoagulation purposes. -Will transition to IV lopressor as he is now NPO and has an NG tube.  Ileus -Likely post-ERCP and 2/2 narcotic use. -GI has seen and has made NPO and has placed an NG tube. -Narcotics have been discontinued. -May need to delay cholecystectomy scheduled for tomorrow.  History of coronary artery disease -No chest pain at present, stable  Hypertension -Well-controlled  History of bioprosthetic aortic valve replacement   DVT prophylaxis: Full dose anticoagulation with Lovenox    Code Status: Full code Family Communication: Patient only Disposition Plan: Pending cholecystectomy and medical improvement.  Consultants:   GI  Surgery  Procedures:   None  Antimicrobials:  Anti-infectives (From admission, onward)   Start     Dose/Rate Route Frequency Ordered Stop   12/05/17 0600  ceFAZolin (ANCEF) IVPB 2g/100 mL premix     2 g 200 mL/hr over 30 Minutes Intravenous On call to O.R. 12/04/17 1455 12/06/17 0559   12/02/17 1000  piperacillin-tazobactam (ZOSYN) IVPB 3.375 g     3.375 g 12.5 mL/hr over 240 Minutes Intravenous Every 8 hours 12/02/17 0947     12/02/17 0945  piperacillin-tazobactam (ZOSYN) IVPB 3.375 g  Status:  Discontinued     3.375 g 100 mL/hr over 30 Minutes Intravenous Every 8 hours 12/02/17 0941 12/02/17 0946       Subjective: Less abdominal pain, is hungry.  Concerned about lack of bowel movements, states abdomen is getting more distended  Objective: Vitals:   12/06/17 2124 12/07/17 0500 12/07/17 1413 12/07/17 1552  BP: 121/80 125/81 137/88 (!) 149/92  Pulse: (!) 105 (!) 102 (!) 103 (!) 104  Resp: 18 18 16    Temp: (!) 97.5 F (36.4 C) (!) 89.2 F (31.8 C) 97.9 F (36.6 C)   TempSrc: Oral Oral Oral   SpO2: 96% 95% 98%   Weight:      Height:        Intake/Output Summary (Last 24 hours) at 12/07/2017 1633 Last data filed at 12/07/2017 8119 Gross per 24 hour  Intake 1163.75 ml  Output 700 ml  Net 463.75 ml   Filed Weights   12/02/17 0459 12/02/17 0923  Weight: 88  kg (194 lb) 88 kg (194 lb 0.1 oz)    Examination:  General exam: Alert, awake, oriented x 3 Respiratory system: Clear to auscultation. Respiratory effort normal. Cardiovascular system:RRR. No murmurs, rubs, gallops. Gastrointestinal system: Abdomen is very distended, soft and tender to palpation to the right upper quadrant and epigastric area. No organomegaly or masses felt. Decreased bowel sounds heard. Central nervous system: Alert and oriented. No focal  neurological deficits. Extremities: No C/C/E, +pedal pulses Skin: No rashes, lesions or ulcers Psychiatry: Judgement and insight appear normal. Mood & affect appropriate.     Data Reviewed: I have personally reviewed following labs and imaging studies  CBC: Recent Labs  Lab 12/02/17 0517 12/03/17 0435 12/04/17 0536 12/05/17 0617 12/06/17 0730 12/07/17 0551  WBC 8.4 8.1 6.0 8.9 11.8* 10.0  NEUTROABS 7.7  --  5.0  --   --   --   HGB 13.2 11.1* 10.7* 10.4* 10.8* 10.3*  HCT 39.0 34.6* 33.2* 32.5* 34.0* 32.0*  MCV 101.3* 105.8* 106.4* 106.6* 107.9* 107.7*  PLT 221 169 145* 190 238 226   Basic Metabolic Panel: Recent Labs  Lab 12/02/17 0517 12/03/17 0435 12/04/17 0536 12/05/17 0617 12/07/17 0551 12/07/17 1341  NA 135 139 137 134* 142  --   K 3.5 4.8 4.2 4.4 4.6  --   CL 101 106 107 104 109  --   CO2 21* 22 20* 20* 21*  --   GLUCOSE 161* 111* 105* 136* 84  --   BUN 20 24* 23* 26* 23*  --   CREATININE 1.09 1.39* 1.06 1.12 0.97  --   CALCIUM 9.1 7.9* 8.0* 8.0* 8.8*  --   MG  --   --  1.6*  --   --  1.9  PHOS  --   --   --   --   --  2.5   GFR: Estimated Creatinine Clearance: 76.9 mL/min (by C-G formula based on SCr of 0.97 mg/dL). Liver Function Tests: Recent Labs  Lab 12/03/17 0435 12/04/17 0536 12/05/17 0617 12/06/17 0730 12/07/17 0551  AST 84* 110* 96*  95* 44* 26  ALT 67* 66* 78*  76* 61 41  ALKPHOS 148* 147* 159*  158* 141* 113  BILITOT 1.5* 4.3* 2.0*  2.1* 1.7* 1.7*  PROT 6.5 6.9 6.9  6.8 7.2 6.6  ALBUMIN 3.0* 2.9* 2.9*  2.9* 2.9* 2.6*   Recent Labs  Lab 12/02/17 0517 12/03/17 0900 12/04/17 0536 12/06/17 0730  LIPASE 3,298* 1,375* 329* 79*   No results for input(s): AMMONIA in the last 168 hours. Coagulation Profile: Recent Labs  Lab 12/02/17 0517 12/03/17 0435 12/04/17 0536  INR 2.14 1.98 1.30   Cardiac Enzymes: No results for input(s): CKTOTAL, CKMB, CKMBINDEX, TROPONINI in the last 168 hours. BNP (last 3 results) No results for  input(s): PROBNP in the last 8760 hours. HbA1C: No results for input(s): HGBA1C in the last 72 hours. CBG: No results for input(s): GLUCAP in the last 168 hours. Lipid Profile: No results for input(s): CHOL, HDL, LDLCALC, TRIG, CHOLHDL, LDLDIRECT in the last 72 hours. Thyroid Function Tests: No results for input(s): TSH, T4TOTAL, FREET4, T3FREE, THYROIDAB in the last 72 hours. Anemia Panel: No results for input(s): VITAMINB12, FOLATE, FERRITIN, TIBC, IRON, RETICCTPCT in the last 72 hours. Urine analysis:    Component Value Date/Time   COLORURINE YELLOW 12/02/2017 2335   APPEARANCEUR CLEAR 12/02/2017 2335   LABSPEC 1.036 (H) 12/02/2017 2335   PHURINE 5.0 12/02/2017 2335   GLUCOSEU NEGATIVE 12/02/2017 2335  HGBUR SMALL (A) 12/02/2017 2335   BILIRUBINUR NEGATIVE 12/02/2017 2335   KETONESUR NEGATIVE 12/02/2017 2335   PROTEINUR NEGATIVE 12/02/2017 2335   UROBILINOGEN 1.0 07/14/2012 1243   NITRITE NEGATIVE 12/02/2017 2335   LEUKOCYTESUR NEGATIVE 12/02/2017 2335   Sepsis Labs: @LABRCNTIP (procalcitonin:4,lacticidven:4)  ) Recent Results (from the past 240 hour(s))  Surgical pcr screen     Status: None   Collection Time: 12/06/17 10:01 AM  Result Value Ref Range Status   MRSA, PCR NEGATIVE NEGATIVE Final   Staphylococcus aureus NEGATIVE NEGATIVE Final    Comment: (NOTE) The Xpert SA Assay (FDA approved for NASAL specimens in patients 78 years of age and older), is one component of a comprehensive surveillance program. It is not intended to diagnose infection nor to guide or monitor treatment.          Radiology Studies: Dg Chest 1 View  Result Date: 12/06/2017 CLINICAL DATA:  Nasogastric tube placement EXAM: CHEST 1 VIEW COMPARISON:  4 days prior FINDINGS: Nasogastric tube tip reaches the stomach with side port at the GE junction. Low lung volumes with bandlike opacity at the left base. Cardiomegaly. History of aortic valve replacement. No Kerley lines, effusion, or  pneumothorax. IMPRESSION: 1. Nasogastric tube with tip at the proximal stomach. 2. Atelectasis at the left base. Electronically Signed   By: Marnee Spring M.D.   On: 12/06/2017 15:03   Dg Abd 1 View  Result Date: 12/07/2017 CLINICAL DATA:  Patient with history of small bowel obstruction. Abdominal tightness. EXAM: ABDOMEN - 1 VIEW COMPARISON:  Abdominal radiograph 12/06/2017. FINDINGS: Gas is demonstrated within the small bowel throughout the abdomen. Enteric tube tip and side-port project over the stomach. Interval increase in gaseous distention of the colon measuring up to 17 cm. Supine evaluation limited for the detection of free intraperitoneal air. Lumbar spine degenerative changes. IMPRESSION: Marked gaseous distention of the colon measuring up to 17 cm, increased from prior. Electronically Signed   By: Annia Belt M.D.   On: 12/07/2017 14:48   Dg Abd Portable 1v  Result Date: 12/06/2017 CLINICAL DATA:  Nausea, abdominal distension, recent ERCP and sphincterotomy with extraction of CBD stones EXAM: PORTABLE ABDOMEN - 1 VIEW COMPARISON:  Portable exam 1042 hours compared to 12/02/2017 FINDINGS: Air-filled small bowel loops throughout abdomen. Gaseous distention of transverse colon up to 13.2 cm diameter. No definite bowel wall thickening. Bones demineralized with degenerative disc and facet disease changes of the lumbar spine. IMPRESSION: Gaseous distention of colon up to 13.2 cm diameter. Additional air-filled upper normal caliber small bowel loops. Electronically Signed   By: Ulyses Southward M.D.   On: 12/06/2017 12:19        Scheduled Meds: . enoxaparin (LOVENOX) injection  90 mg Subcutaneous Q12H  . metoprolol tartrate  5 mg Intravenous Q8H  . polyethylene glycol  17 g Oral Daily   Continuous Infusions: . 0.9 % NaCl with KCl 20 mEq / L 75 mL/hr at 12/07/17 0511  . magnesium sulfate 1 - 4 g bolus IVPB    . piperacillin-tazobactam (ZOSYN)  IV Stopped (12/07/17 1422)     LOS: 5 days     Time spent: 25 minutes. Greater than 50% of this time was spent in direct contact with the patient coordinating care.     Chaya Jan, MD [facility] Pager [phone]  If 7PM-7AM, please contact night-coverage [email or web address] Password [facility] 12/07/2017, 4:33 PM

## 2017-12-07 NOTE — Progress Notes (Signed)
PT placed on Left Side with rectal tube in place. Tolerated well. To be removed at 2220 Continue to monitor

## 2017-12-07 NOTE — Progress Notes (Signed)
Late entry:  Patient's NG tube clamped.  Patient ambulated in hallway.  Tolerated well.  Bowel sounds present.  Patient reports "feeling gas moving" but has not passed gas yet.

## 2017-12-07 NOTE — Progress Notes (Addendum)
Abdomen remains distended. Has passed only scant flatus. No BM. No nausea or vomiting. States abdominal pain no more than a 3. Nursing staff reports 1 dose of Dilaudid 0.5 mg overnight  No fever. WBC 10 K, transaminases are now normal. Bilirubin 1.7. Electrolytes good.  Vital signs in last 24 hours: Temp:  [89.2 F (31.8 C)-97.5 F (36.4 C)] 89.2 F (31.8 C) (01/27 0500) Pulse Rate:  [102-115] 102 (01/27 0500) Resp:  [18] 18 (01/27 0500) BP: (121-129)/(73-81) 125/81 (01/27 0500) SpO2:  [93 %-96 %] 95 % (01/27 0500) Last BM Date: 12/07/17 General:   Alert,  pleasant and cooperative in NAD.  Accompanied by spouse. Abdomen:  Abdomen remains grossly distended. Bowel sounds are present. Increased tympany diffusely. Abdomen is soft without focal tenderness or mass. Soft, nontender and nondistended. Extremities:  Without clubbing or edema.    Intake/Output from previous day: 01/26 0701 - 01/27 0700 In: 2181.3 [I.V.:2031.3; IV Piggyback:150] Out: 900 [Urine:300; Emesis/NG output:600] Intake/Output this shift: No intake/output data recorded.  Lab Results: Recent Labs    12/05/17 0617 12/06/17 0730 12/07/17 0551  WBC 8.9 11.8* 10.0  HGB 10.4* 10.8* 10.3*  HCT 32.5* 34.0* 32.0*  PLT 190 238 226   BMET Recent Labs    12/05/17 0617 12/07/17 0551  NA 134* 142  K 4.4 4.6  CL 104 109  CO2 20* 21*  GLUCOSE 136* 84  BUN 26* 23*  CREATININE 1.12 0.97  CALCIUM 8.0* 8.8*   LFT Recent Labs    12/06/17 0730 12/07/17 0551  PROT 7.2 6.6  ALBUMIN 2.9* 2.6*  AST 44* 26  ALT 61 41  ALKPHOS 141* 113  BILITOT 1.7* 1.7*  BILIDIR 0.7*  --   IBILI 1.0*  --    Studies/Results: Dg Chest 1 View  Result Date: 12/06/2017 CLINICAL DATA:  Nasogastric tube placement EXAM: CHEST 1 VIEW COMPARISON:  4 days prior FINDINGS: Nasogastric tube tip reaches the stomach with side port at the GE junction. Low lung volumes with bandlike opacity at the left base. Cardiomegaly. History of aortic valve  replacement. No Kerley lines, effusion, or pneumothorax. IMPRESSION: 1. Nasogastric tube with tip at the proximal stomach. 2. Atelectasis at the left base. Electronically Signed   By: Marnee SpringJonathon  Watts M.D.   On: 12/06/2017 15:03   Dg Abd Portable 1v  Result Date: 12/06/2017 CLINICAL DATA:  Nausea, abdominal distension, recent ERCP and sphincterotomy with extraction of CBD stones EXAM: PORTABLE ABDOMEN - 1 VIEW COMPARISON:  Portable exam 1042 hours compared to 12/02/2017 FINDINGS: Air-filled small bowel loops throughout abdomen. Gaseous distention of transverse colon up to 13.2 cm diameter. No definite bowel wall thickening. Bones demineralized with degenerative disc and facet disease changes of the lumbar spine. IMPRESSION: Gaseous distention of colon up to 13.2 cm diameter. Additional air-filled upper normal caliber small bowel loops. Electronically Signed   By: Ulyses SouthwardMark  Boles M.D.   On: 12/06/2017 12:19    Impression:  Biliary pancreatitis much improved  -  status post ERCP with sphincterotomy and stone extraction. Course complicated by large and small bowel ileus (wife states that after patient underwent left knee replacement several years ago, his hospitalization was prolonged due to development of an ileus requiring NG tube decompression and GI consultation).  Clinically, he has recovered from pancreatitis. At this time, I do not think that repeat cross-sectional imaging is going to add much to his management.  Ongoing ileus likely to delay cholecystectomy.  Recommendations:   Stop opioid analgesics entirely.  Ambulate. Continue NG tube suction intermittently     Placement of rectal tube every 2 hours     Consider treatment with neostigmine if no improvement with above measures and     perioperative use of Entereg.     Discussed management with nursing staff.  I discussed with Dr. Henreitta Leber via telephone.

## 2017-12-07 NOTE — Progress Notes (Signed)
ANTICOAGULATION CONSULT NOTE   Pharmacy Consult for heparin >> Lovenox Indication: atrial fibrillation  Allergies  Allergen Reactions  . Diclofenac Itching  . Naproxen     itching   Patient Measurements: Height: 5\' 10"  (177.8 cm) Weight: 194 lb 0.1 oz (88 kg) IBW/kg (Calculated) : 73 HEPARIN DW (KG): 88  Vital Signs: Temp: 89.2 F (31.8 C) (01/27 0500) Temp Source: Oral (01/27 0500) BP: 125/81 (01/27 0500) Pulse Rate: 102 (01/27 0500)  Labs: Recent Labs    12/05/17 0617 12/05/17 1000 12/06/17 0730 12/07/17 0551  HGB 10.4*  --  10.8* 10.3*  HCT 32.5*  --  34.0* 32.0*  PLT 190  --  238 226  HEPARINUNFRC  --  <0.10* 0.36  --   CREATININE 1.12  --   --  0.97   Estimated Creatinine Clearance: 76.9 mL/min (by C-G formula based on SCr of 0.97 mg/dL).  Medical History: Past Medical History:  Diagnosis Date  . Aortic stenosis    a. s/p bioprosth AVR 04/2010;  b. 05/2012 Echo: EF 50-55%, nl bioprosth fxn w/ 8mmHg gradient, mold dil LA.  Marland Kitchen. Atrial fibrillation and flutter (HCC)    a. s/p Cox-Maze @ time of AVR;  b. Previously on Amio and s/p DCCV x 2 ->failed->now rate controlled and on chronic coumadin.  Marland Kitchen. CAD (coronary artery disease)    Occluded OM1 and LPDA in the setting of left atrial thrombus and cardioembolic event, otherwise nonobstructive disease 04/2010  . COPD (chronic obstructive pulmonary disease) (HCC)   . Diverticulosis   . DJD (degenerative joint disease)    a. s/p R TKA 2010;  b. s/p L TKA 2013.  Marland Kitchen. Essential hypertension   . Gout   . History of pneumonia   . Myocardial infarction (HCC) 04/2010   Cardioembolic with occluded OM1 and left-sided PDA  . 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 Ventricular fibrillation (HCC) 04/2010   VF arrest in setting of thrombotic MI 04/2010  . Wears glasses    Medications:  Medications Prior to Admission  Medication Sig Dispense Refill Last Dose  . acetaminophen (TYLENOL) 500 MG tablet Take 1,000 mg by mouth  every 6 (six) hours as needed.    Past Week at Unknown time  . azithromycin (ZITHROMAX) 250 MG tablet Take 250 mg by mouth daily.   11/30/2017  . benzonatate (TESSALON) 200 MG capsule Take 200 mg by mouth 2 (two) times daily as needed for cough.   11/30/2017  . lisinopril (PRINIVIL,ZESTRIL) 5 MG tablet TAKE ONE (1) TABLET EACH DAY 90 tablet 3 11/30/2017  . metoprolol succinate (TOPROL-XL) 50 MG 24 hr tablet Take 1 tablet (50 mg total) by mouth 2 (two) times daily. Take with or immediately following a meal. 60 tablet 6 11/30/2017 at 1800  . warfarin (COUMADIN) 5 MG tablet Take 2 tablets daily except 1 tablet on Mondays and Fridays or as directed 60 tablet 4 11/30/2017 at 1800   Assessment: 71 yoman on coumadin PTA for afib to start heparin bridge after ERCP.  Plans for surgery delayed until Monday.  Plan to switch to Lovenox for anticoagulation for afib.  D/W Dr Ardyth HarpsHernandez.  Plan for last dose of Lovenox to be Sunday (1/27) at 6pm.      Goal of Therapy:  Full dose anticoagulation with Lovenox Monitor platelets by anticoagulation protocol: Yes   Plan:   Lovenox 1mg /Kg SQ q12hrs (last dose on Sunday 1/27 at 6pm)  Monitor labs, s/sx bleeding complications.   12/07/2017,9:05 AM

## 2017-12-08 ENCOUNTER — Encounter (HOSPITAL_COMMUNITY): Payer: Self-pay | Admitting: Internal Medicine

## 2017-12-08 ENCOUNTER — Inpatient Hospital Stay (HOSPITAL_COMMUNITY): Payer: Medicare Other

## 2017-12-08 ENCOUNTER — Encounter (HOSPITAL_COMMUNITY)
Admission: EM | Disposition: A | Discharge status: Home / Self Care | Payer: Self-pay | Source: Home / Self Care | Attending: Internal Medicine

## 2017-12-08 DIAGNOSIS — K559 Vascular disorder of intestine, unspecified: Secondary | ICD-10-CM

## 2017-12-08 DIAGNOSIS — R933 Abnormal findings on diagnostic imaging of other parts of digestive tract: Secondary | ICD-10-CM

## 2017-12-08 HISTORY — PX: COLONOSCOPY: SHX5424

## 2017-12-08 HISTORY — PX: BOWEL DECOMPRESSION: SHX5532

## 2017-12-08 LAB — BASIC METABOLIC PANEL
ANION GAP: 12 (ref 5–15)
BUN: 23 mg/dL — ABNORMAL HIGH (ref 6–20)
CHLORIDE: 108 mmol/L (ref 101–111)
CO2: 22 mmol/L (ref 22–32)
Calcium: 8.9 mg/dL (ref 8.9–10.3)
Creatinine, Ser: 0.96 mg/dL (ref 0.61–1.24)
Glucose, Bld: 88 mg/dL (ref 65–99)
POTASSIUM: 4.5 mmol/L (ref 3.5–5.1)
SODIUM: 142 mmol/L (ref 135–145)

## 2017-12-08 LAB — CBC
HEMATOCRIT: 33.2 % — AB (ref 39.0–52.0)
HEMOGLOBIN: 10.5 g/dL — AB (ref 13.0–17.0)
MCH: 34.3 pg — ABNORMAL HIGH (ref 26.0–34.0)
MCHC: 31.6 g/dL (ref 30.0–36.0)
MCV: 108.5 fL — AB (ref 78.0–100.0)
Platelets: 250 10*3/uL (ref 150–400)
RBC: 3.06 MIL/uL — AB (ref 4.22–5.81)
RDW: 13.9 % (ref 11.5–15.5)
WBC: 8.4 10*3/uL (ref 4.0–10.5)

## 2017-12-08 LAB — MAGNESIUM: Magnesium: 2 mg/dL (ref 1.7–2.4)

## 2017-12-08 LAB — PHOSPHORUS: Phosphorus: 3 mg/dL (ref 2.5–4.6)

## 2017-12-08 SURGERY — COLONOSCOPY
Anesthesia: Moderate Sedation

## 2017-12-08 MED ORDER — PROMETHAZINE HCL 25 MG/ML IJ SOLN: 6.2500 mg | Freq: Four times a day (QID) | INTRAMUSCULAR | Status: DC | PRN

## 2017-12-08 MED ORDER — STERILE WATER FOR IRRIGATION IR SOLN
Status: DC | PRN
  Administered 2017-12-08: 2.5 mL

## 2017-12-08 MED ORDER — METOPROLOL TARTRATE 5 MG/5ML IV SOLN
5.0000 mg | Freq: Four times a day (QID) | INTRAVENOUS | Status: DC
  Administered 2017-12-08 – 2017-12-12 (×17): 5 mg via INTRAVENOUS
  Filled 2017-12-08 (×18): qty 5

## 2017-12-08 MED ORDER — MIDAZOLAM HCL 5 MG/5ML IJ SOLN
INTRAMUSCULAR | Status: DC | PRN
  Administered 2017-12-08: 2 mg via INTRAVENOUS
  Administered 2017-12-08: 1 mg via INTRAVENOUS
  Administered 2017-12-08: 2 mg via INTRAVENOUS

## 2017-12-08 MED ORDER — CHLORHEXIDINE GLUCONATE CLOTH 2 % EX PADS
6.0000 | MEDICATED_PAD | Freq: Once | CUTANEOUS | Status: AC
  Administered 2017-12-08: 6 via TOPICAL

## 2017-12-08 MED ORDER — ENOXAPARIN SODIUM 100 MG/ML ~~LOC~~ SOLN
90.0000 mg | Freq: Two times a day (BID) | SUBCUTANEOUS | Status: AC
  Administered 2017-12-08 – 2017-12-09 (×3): 90 mg via SUBCUTANEOUS
  Filled 2017-12-08 (×4): qty 1

## 2017-12-08 MED ORDER — IOPAMIDOL (ISOVUE-300) INJECTION 61%
100.0000 mL | Freq: Once | INTRAVENOUS | Status: AC | PRN
  Administered 2017-12-08: 100 mL via INTRAVENOUS

## 2017-12-08 MED ORDER — PANTOPRAZOLE SODIUM 40 MG IV SOLR
40.0000 mg | Freq: Every day | INTRAVENOUS | Status: DC
  Administered 2017-12-08 – 2017-12-12 (×5): 40 mg via INTRAVENOUS
  Filled 2017-12-08 (×6): qty 40

## 2017-12-08 MED ORDER — MIDAZOLAM HCL 5 MG/5ML IJ SOLN
INTRAMUSCULAR | Status: AC
  Filled 2017-12-08: qty 10

## 2017-12-08 MED ORDER — MEPERIDINE HCL 100 MG/ML IJ SOLN
INTRAMUSCULAR | Status: AC
  Filled 2017-12-08: qty 2

## 2017-12-08 MED ORDER — DEXTROSE 5 % IV SOLN
2.0000 g | INTRAVENOUS | Status: DC
  Filled 2017-12-08: qty 2

## 2017-12-08 MED ORDER — MEPERIDINE HCL 100 MG/ML IJ SOLN
INTRAMUSCULAR | Status: DC | PRN
  Administered 2017-12-08 (×2): 25 mg via INTRAVENOUS

## 2017-12-08 MED ORDER — BISACODYL 5 MG PO TBEC
5.0000 mg | DELAYED_RELEASE_TABLET | Freq: Every evening | ORAL | Status: DC
  Administered 2017-12-08 – 2017-12-12 (×4): 5 mg via ORAL
  Filled 2017-12-08 (×5): qty 1

## 2017-12-08 MED ORDER — METRONIDAZOLE IN NACL 5-0.79 MG/ML-% IV SOLN
500.0000 mg | Freq: Three times a day (TID) | INTRAVENOUS | Status: DC
  Administered 2017-12-08 – 2017-12-11 (×9): 500 mg via INTRAVENOUS
  Filled 2017-12-08 (×10): qty 100

## 2017-12-08 MED ORDER — SODIUM CHLORIDE 0.9 % IV SOLN
INTRAVENOUS | Status: DC
  Administered 2017-12-08: 20 mL/h via INTRAVENOUS

## 2017-12-08 NOTE — Progress Notes (Signed)
PT placed on Left Side with rectal tube in place. Tolerated well. To be removed at0545 Continue to monitor. Gas expulsion heard.

## 2017-12-08 NOTE — Progress Notes (Addendum)
Patient's wife pulled me aside while in hallway, and she wanted to give feedback on her stay. She is very grateful for the care here and has felt they have received excellent care. She did mention that if neostigmine was indicated, she would [facility] be [facility]. I told her I would make note of this in the chart.   REVIEWED-NO ADDITIONAL RECOMMENDATIONS.

## 2017-12-08 NOTE — Progress Notes (Signed)
PT NPO since midnight. Fluids infusing. SCDs at the bedside. PT refuses SCDs at this time. Staph/MRSA PCR negative. Rectal tube placed every 2 hours and inserted for and removed, with little gas expulsion and relief to PT.

## 2017-12-08 NOTE — H&P (Addendum)
Primary Care Physician:  Benita StabileHall, John Z, MD Primary Gastroenterologist:  Dr. Darrick PennaFields  Pre-Procedure History & Physical: HPI:  [name] is a 72 y.o. male here for ILEUS-DISTENDED CECUM(> 10 CM ON CT JAN 28)  Past Medical History:  Diagnosis Date  . Aortic stenosis    a. s/p bioprosth AVR 04/2010;  b. 05/2012 Echo: EF 50-55%, nl bioprosth fxn w/ 8mmHg gradient, mold dil LA.  Marland Kitchen. Atrial fibrillation and flutter (HCC)    a. s/p Cox-Maze @ time of AVR;  b. Previously on Amio and s/p DCCV x 2 ->failed->now rate controlled and on chronic coumadin.  Marland Kitchen. CAD (coronary artery disease)    Occluded OM1 and LPDA in the setting of left atrial thrombus and cardioembolic event, otherwise nonobstructive disease 04/2010  . COPD (chronic obstructive pulmonary disease) (HCC)   . Diverticulosis   . DJD (degenerative joint disease)    a. s/p R TKA 2010;  b. s/p L TKA 2013.  Marland Kitchen. Essential hypertension   . Gout   . History of pneumonia   . Myocardial infarction (HCC) 04/2010   Cardioembolic with occluded OM1 and left-sided PDA  . 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 Ventricular fibrillation (HCC) 04/2010   VF arrest in setting of thrombotic MI 04/2010  . Wears glasses     Past Surgical History:  Procedure Laterality Date  . Aortic valve replacement with pericardial tissue valve, [place]  05/04/2010  . BALLOON DILATION N/A 12/04/2017   Procedure: CRE BALLOON DILATION;  Surgeon: Corbin Adeourk, Robert M, MD;  Location: [facility];  Service: Gastroenterology;  Laterality: N/A;  . CARPAL TUNNEL RELEASE Right 10/18/2013   Procedure: RIGHT CARPAL TUNNEL RELEASE;  Surgeon: Nicki ReaperGary R Kuzma, MD;  Location: [facility];  Service: Orthopedics;  Laterality: Right;  . CARPAL TUNNEL RELEASE Left 02/09/2014   Procedure: CARPAL TUNNEL RELEASE LEFT;  Surgeon: Nicki ReaperGary R Kuzma, MD;  Location: [facility];  Service: Orthopedics;  Laterality: Left;  . CATARACT EXTRACTION W/PHACO Right 03/04/2016   Procedure:  CATARACT EXTRACTION PHACO AND INTRAOCULAR LENS PLACEMENT (IOC);  Surgeon: Gemma PayorKerry Hunt, MD;  Location: [facility];  Service: Ophthalmology;  Laterality: Right;  CDE:5.43  . COLONOSCOPY  2005   Dr. Jena Gaussourk: internal hemorrhoids, pancolonic diverticula  . ERCP N/A 12/04/2017   Procedure: ENDOSCOPIC RETROGRADE CHOLANGIOPANCREATOGRAPHY (ERCP) with stone extraction;  Surgeon: Corbin Adeourk, Robert M, MD;  Location: [facility];  Service: Gastroenterology;  Laterality: N/A;  Sphincterotomy, removal of stones   . INGUINAL HERNIA REPAIR Right 08/25/2013   Procedure: HERNIA REPAIR INGUINAL ADULT;  Surgeon: Shelly Rubensteinouglas A Blackman, MD;  Location: [facility];  Service: General;  Laterality: Right;  . INSERTION OF MESH Right 08/25/2013   Procedure: INSERTION OF MESH;  Surgeon: Shelly Rubensteinouglas A Blackman, MD;  Location: [facility];  Service: General;  Laterality: Right;  . RETINAL LASER PROCEDURE     right eye   . Right ankle pinning     Fell from tree w/fracture several years ago.  . Right total knee replacement  04/02/2011  . SPHINCTEROTOMY N/A 12/04/2017   Procedure: SPHINCTEROTOMY;  Surgeon: Corbin Adeourk, Robert M, MD;  Location: [facility];  Service: Gastroenterology;  Laterality: N/A;  . TOTAL KNEE ARTHROPLASTY  07/20/2012   Procedure: TOTAL KNEE ARTHROPLASTY;  Surgeon: Nilda Simmerobert A Wainer, MD;  Location: [facility];  Service: Orthopedics;  Laterality: Left;  left total knee arthroplasty  . TRIGGER FINGER RELEASE Left 02/09/2014   Procedure: RELEASE A-1 PULLEY LEFT SMALL FINGER;  Surgeon:  Nicki Reaper, MD;  Location: [facility];  Service: Orthopedics;  Laterality: Left;  . ULNAR NERVE TRANSPOSITION Right 10/18/2013   Procedure: RIGHT ULNAR NERVE DECOMPRESSION/POSSIBLE TRANSPOSITION;  Surgeon: Nicki Reaper, MD;  Location: [facility];  Service: Orthopedics;  Laterality: Right;  . ULNAR NERVE TRANSPOSITION Left 02/09/2014   Procedure: DECOMPRESSION LEFT ULNAR NERVE ;  Surgeon: Nicki Reaper, MD;  Location: [facility];   Service: Orthopedics;  Laterality: Left;    Prior to Admission medications   Medication Sig Start Date End Date Taking? Authorizing Provider  acetaminophen (TYLENOL) 500 MG tablet Take 1,000 mg by mouth every 6 (six) hours as needed.    Yes [provider]  azithromycin (ZITHROMAX) 250 MG tablet Take 250 mg by mouth daily.   Yes [provider]  benzonatate (TESSALON) 200 MG capsule Take 200 mg by mouth 2 (two) times daily as needed for cough.   Yes [provider]  lisinopril (PRINIVIL,ZESTRIL) 5 MG tablet TAKE ONE (1) TABLET EACH DAY 08/18/17  Yes Jonelle Sidle, MD  metoprolol succinate (TOPROL-XL) 50 MG 24 hr tablet Take 1 tablet (50 mg total) by mouth 2 (two) times daily. Take with or immediately following a meal. 08/28/17  Yes Jonelle Sidle, MD  warfarin (COUMADIN) 5 MG tablet Take 2 tablets daily except 1 tablet on Mondays and Fridays or as directed 09/08/17  Yes Jonelle Sidle, MD    Allergies as of 12/02/2017 - Review Complete 12/02/2017  Allergen Reaction Noted  . Diclofenac Itching 08/09/2011  . Naproxen  02/07/2011    Family History  Problem Relation Age of Onset  . Hypertension Father   . Colon cancer Neg Hx     Social History   Socioeconomic History  . Marital status: Married    Spouse name: Not on file  . Number of children: Not on file  . Years of education: Not on file  . Highest education level: Not on file  Social Needs  . Financial resource strain: Not on file  . Food insecurity - worry: Not on file  . Food insecurity - inability: Not on file  . Transportation needs - medical: Not on file  . Transportation needs - non-medical: Not on file  Occupational History  . Occupation: retired  Tobacco Use  . Smoking status: Former Smoker    Types: Cigars    Last attempt to quit: 04/24/2010    Years since quitting: 7.6  . Smokeless tobacco: Never Used  Substance and Sexual Activity  . Alcohol use: Yes    Comment:  occasional, possibly every 2-3 days   . Drug use: No  . Sexual activity: Not Currently  Other Topics Concern  . Not on file  Social History Narrative  . Not on file    Review of Systems: See HPI, otherwise negative ROS   Physical Exam: BP 121/89 (BP Location: Left Arm)   Pulse (!) 109   Temp 97.8 F (36.6 C) (Oral)   Resp 16   Ht 5\' 10"  (1.778 m)   Wt 194 lb 0.1 oz (88 kg)   SpO2 97%   BMI 27.84 kg/m  General:   Alert,  pleasant and cooperative in NAD Head:  Normocephalic and atraumatic. Ng tube in place-brown return Neck:  Supple; Lungs:  Clear throughout to auscultation.    Heart:  IRRegular rate and rhythm. Abdomen:  Soft, mildly tender and distended. tinkling bowel sounds, without guarding, and without rebound.  Neurologic:  Alert and  oriented x4;  NO  NEW FOCAL DEFICITS  Impression/Plan:    ILEUS-DISTENDED CECUM. I PERSONALLY REVIEWED THE CT WITH DR. Tyron Russell.   PLAN: 1. TCS FOR DECOMPRESSION. DISCUSSED PROCEDURE, BENEFITS, & RISKS: < 1% chance of medication reaction, bleeding, perforation, or rupture of spleen/liver.

## 2017-12-08 NOTE — Progress Notes (Signed)
PT placed on Left Side with rectal tube in place. Tolerated well. To be removed at 0020 Continue to monitor

## 2017-12-08 NOTE — Progress Notes (Addendum)
[facility] Progress Note  4 Days Post-Op  Subjective: Saw patient earlier this AM. Complaints of continued bloating, and having some discomfort in his abdomen. Minimal out with the red rubber tube.   Objective: Vital signs in last 24 hours: Temp:  [97.7 F (36.5 C)-98.4 F (36.9 C)] 97.7 F (36.5 C) (01/28 0526) Pulse Rate:  [103-104] 104 (01/28 0526) Resp:  [16-18] 18 (01/28 0526) BP: (126-149)/(76-92) 127/91 (01/28 0526) SpO2:  [97 %-98 %] 97 % (01/28 0526) Last BM Date: 12/07/17  Intake/Output from previous day: 01/27 0701 - 01/28 0700 In: 1987.5 [I.V.:1837.5; IV Piggyback:150] Out: 575 [Urine:375; Emesis/NG output:200] Intake/Output this shift: No intake/output data recorded.  General appearance: alert, cooperative and no distress Resp: normal work breathing GI: tympanic, soft, minimal tenderness, no rebound or guarding Extremities: extremities normal, atraumatic, no cyanosis or edema Rectal- no gross blood, no gas released, red rubber inserted, some liquid stool, tender on exam  Lab Results:  Recent Labs    12/07/17 0551 12/08/17 0534  WBC 10.0 8.4  HGB 10.3* 10.5*  HCT 32.0* 33.2*  PLT 226 250   BMET Recent Labs    12/07/17 0551 12/08/17 0534  NA 142 142  K 4.6 4.5  CL 109 108  CO2 21* 22  GLUCOSE 84 88  BUN 23* 23*  CREATININE 0.97 0.96  CALCIUM 8.8* 8.9    Studies/Results: Dg Chest 1 View  Result Date: 12/06/2017 CLINICAL DATA:  Nasogastric tube placement EXAM: CHEST 1 VIEW COMPARISON:  4 days prior FINDINGS: Nasogastric tube tip reaches the stomach with side port at the GE junction. Low lung volumes with bandlike opacity at the left base. Cardiomegaly. History of aortic valve replacement. No Kerley lines, effusion, or pneumothorax. IMPRESSION: 1. Nasogastric tube with tip at the proximal stomach. 2. Atelectasis at the left base. Electronically Signed   By: Marnee Spring M.D.   On: 12/06/2017 15:03   Dg Abd 1 View  Result  Date: 12/08/2017 CLINICAL DATA:  Ileus. EXAM: ABDOMEN - 1 VIEW COMPARISON:  Radiograph of December 07, 2017. FINDINGS: Nasogastric tube tip is seen in expected position of distal stomach. No small bowel dilatation is noted. Distention of colon is again noted measuring 18 cm, which is not significantly changed compared to prior exam. IMPRESSION: Grossly stable colonic dilatation as described above. Electronically Signed   By: Lupita Raider, M.D.   On: 12/08/2017 10:24   Dg Abd 1 View  Result Date: 12/07/2017 CLINICAL DATA:  Patient with history of small bowel obstruction. Abdominal tightness. EXAM: ABDOMEN - 1 VIEW COMPARISON:  Abdominal radiograph 12/06/2017. FINDINGS: Gas is demonstrated within the small bowel throughout the abdomen. Enteric tube tip and side-port project over the stomach. Interval increase in gaseous distention of the colon measuring up to 17 cm. Supine evaluation limited for the detection of free intraperitoneal air. Lumbar spine degenerative changes. IMPRESSION: Marked gaseous distention of the colon measuring up to 17 cm, increased from prior. Electronically Signed   By: Annia Belt M.D.   On: 12/07/2017 14:48   Dg Chest Port 1 View  Result Date: 12/07/2017 CLINICAL DATA:  Status post NG tube placement. EXAM: PORTABLE CHEST 1 VIEW COMPARISON:  Chest radiograph 12/06/2017. FINDINGS: Enteric tube tip and side-port project over the stomach. Stable cardiac and mediastinal contours status post median sternotomy. Focal consolidation left lower hemithorax. No pleural effusion or pneumothorax. Gaseous distended loops of bowel within the upper abdomen. IMPRESSION: Enteric tube tip and side-port project over the stomach. Gaseous distended  loops of bowel within the upper abdomen. Consider dedicated abdominal radiography. Focal opacity left lower hemithorax may represent atelectasis. Infection not excluded. Electronically Signed   By: Annia Beltrew  Davis M.D.   On: 12/07/2017 21:32     Anti-infectives: Anti-infectives (From admission, onward)   Start     Dose/Rate Route Frequency Ordered Stop   12/08/17 0600  cefoTEtan (CEFOTAN) 2 g in dextrose 5 % 50 mL IVPB     2 g 100 mL/hr over 30 Minutes Intravenous On call to O.R. 12/08/17 0009 12/09/17 0559   12/05/17 0600  ceFAZolin (ANCEF) IVPB 2g/100 mL premix     2 g 200 mL/hr over 30 Minutes Intravenous On call to O.R. 12/04/17 1455 12/06/17 0559   12/02/17 1000  piperacillin-tazobactam (ZOSYN) IVPB 3.375 g     3.375 g 12.5 mL/hr over 240 Minutes Intravenous Every 8 hours 12/02/17 0947     12/02/17 0945  piperacillin-tazobactam (ZOSYN) IVPB 3.375 g  Status:  Discontinued     3.375 g 100 mL/hr over 30 Minutes Intravenous Every 8 hours 12/02/17 0941 12/02/17 0946      Assessment/Plan: [name] is a 72 yo with a history of gallstone pancreatitis s/p ERCP who now has an ileus with a large component being a colonic ileus. He has had NG output and remains distended. Minimal flatus reported even after the red rubber q2 hrs overnight. He remains extremely distended. The Xray to me is looking more concerning for a cecal volvulus, and I have gone and reviewed it with Dr, Tyron RussellBoles, radiology, who agrees.    I have ordered a stat CT with IV contrast to assess, and I have explained my worry to the family and the patient. He does not need to get any neostigmine if this si a volvulus.  If it is a cecal volvulus he will need a more urgent exploration (open), with plan for right hemicolectomy and a cholecystectomy at the same time. The wife is extremely concerned about this possibility, and is already asking about transfer again.   I am happy to oblige with a transfer if the patient is able to get a surgery in the same timeframe that we could provide it here. I do not want him to have the volvulus for any more extended period of time, with a colon to 18cm (on Xray) and run the risk of a perforation that would make him sicker, and place more  strain on his system and his heart, which is what the wife's main concern is at this time.  I have updated Dr. Ardyth HarpsHernandez regarding this plan. Will hold on any plans for transfer until we can see the CT scan.   If it is a sigmoid volvulus, then GI can decompress, but the imaging is concerning more for cecal to me. If it is just a normal colonic ileus, then we might could consider neostigmine, but the [facility] also wants [facility] if this were to occur due to issues with neostigmine and bradycardia. heart block that can occur with administration.    Lytes look good for now, NPO, NG in place   LOS: 6 days    Lucretia RoersLindsay C Stanislaw Acton 12/08/2017  Update:  CT a/p with no cecal volvulus but the colon is dilated in the area of the cecum and transverse colon to about 13. Discussed with Dr. Tyron RussellBoles who spoke with Dr. Jena GaussMaxwell and they agree. Dr. Bradly ChrisStroud read the report separately.   Plan for colonic decompression with Dr. Darrick PennaFields.   Have  spent over 35 minutes in total today with the family discussing the plan.   Algis Greenhouse, MD [facility] [address] [place], [place] [address] [phone] (office)   Dg Abd 1 View  Result Date: 12/08/2017 CLINICAL DATA:  Ileus. EXAM: ABDOMEN - 1 VIEW COMPARISON:  Radiograph of December 07, 2017. FINDINGS: Nasogastric tube tip is seen in expected position of distal stomach. No small bowel dilatation is noted. Distention of colon is again noted measuring 18 cm, which is not significantly changed compared to prior exam. IMPRESSION: Grossly stable colonic dilatation as described above. Electronically Signed   By: Lupita Raider, M.D.   On: 12/08/2017 10:24   Ct Abdomen Pelvis W Contrast  Result Date: 12/08/2017 CLINICAL DATA:  Abdominal pain and distention. Evaluate for volvulus. EXAM: CT ABDOMEN AND PELVIS WITH CONTRAST TECHNIQUE: Multidetector CT imaging of the abdomen and pelvis was performed using the standard protocol following  bolus administration of intravenous contrast. CONTRAST:  ISOVUE-300 IOPAMIDOL (ISOVUE-300) INJECTION 61% COMPARISON:  12/02/2017 FINDINGS: Lower chest: Dependent changes are noted in both lung bases. Small right pleural effusion. Hepatobiliary: No suspicious liver abnormality. Tiny stones noted within the gallbladder. Pancreas: There is diffuse edema and inflammation involving the pancreas. Most severe at the head through body compatible acute pancreatitis. There is surrounding fat stranding identified. No pseudocyst identified. Spleen: Normal in size without focal abnormality. Adrenals/Urinary Tract: The adrenal glands are normal. The right kidney is normal. Tiny cyst arises from the upper pole the left kidney. No hydronephrosis. Urinary bladder appears normal. Stomach/Bowel: Enteric tube is identified within the stomach. The small bowel loops have a normal course and caliber. There is a large mobile cecum which extends across the midline to the left upper quadrant of the abdomen. The ileocecal valve is identified within the left abdomen, image 37 of series 3. The distended cecum measures up to 10.4 cm and has air-contrast level. Air-filled loop of transverse colon is identified. Transition to normal to decreased caliber: Is identified at the level of the splenic flexure. Distal colonic diverticula noted without acute inflammation. Vascular/Lymphatic: Aortic atherosclerosis. No aneurysm. No upper abdominal or pelvic adenopathy. Reproductive: Prostate is unremarkable. Other: Trace ascites identified within the upper abdomen. No focal fluid collections. Musculoskeletal: There is degenerative disc disease identified throughout the lumbar spine. No suspicious bone lesions. IMPRESSION: 1. Changes of acute pancreatitis involving the head through body of pancreas noted. No evidence for pseudocyst formation. Tiny gallstones are identified within the gallbladder. No definite stones within the common bile duct. 2.  Abnormal distension of the cecum through the transverse colon to the level of the splenic flexure. No evidence for cecal volvulus. Favor colonic ileus. 3.  Aortic Atherosclerosis (ICD10-I70.0). 4. Small right effusion. Dependent changes noted within both lung bases. Electronically Signed   By: Signa Kell M.D.   On: 12/08/2017 12:46   Dg Chest Port 1 View  Result Date: 12/07/2017 CLINICAL DATA:  Status post NG tube placement. EXAM: PORTABLE CHEST 1 VIEW COMPARISON:  Chest radiograph 12/06/2017. FINDINGS: Enteric tube tip and side-port project over the stomach. Stable cardiac and mediastinal contours status post median sternotomy. Focal consolidation left lower hemithorax. No pleural effusion or pneumothorax. Gaseous distended loops of bowel within the upper abdomen. IMPRESSION: Enteric tube tip and side-port project over the stomach. Gaseous distended loops of bowel within the upper abdomen. Consider dedicated abdominal radiography. Focal opacity left lower hemithorax may represent atelectasis. Infection not excluded. Electronically Signed   By: Annia Belt  M.D.   On: 12/07/2017 21:32

## 2017-12-08 NOTE — Progress Notes (Signed)
Rectal tube placed at 0815 for thirty minutes. Tube removed at 0845 with little gas expulsion and no stool present. Pt still complaining of abdominal discomfort and no independent gas expulsion.  Quita SkyeMorgan P Dishmon, RN

## 2017-12-08 NOTE — Progress Notes (Signed)
Subjective: NG tube output appears to be decreasing. 400 ml documented on 1/27 at 0600. An additional 200 ml documented this morning. Small amount of liquid stool expelled with rectal tube this morning. Small amounts of gas with rectal tube. No flatus on own. No nausea. Feels like abdomen is slightly better than yesterday.   Objective: Vital signs in last 24 hours: Temp:  [97.7 F (36.5 C)-98.4 F (36.9 C)] 97.7 F (36.5 C) (01/28 0526) Pulse Rate:  [103-104] 104 (01/28 0526) Resp:  [16-18] 18 (01/28 0526) BP: (126-149)/(76-92) 127/91 (01/28 0526) SpO2:  [97 %-98 %] 97 % (01/28 0526) Last BM Date: 12/07/17 General:   Alert and oriented, pleasant Head:  Normocephalic and atraumatic. Abdomen:  Abdomen grossly distended but soft, +BS largely tympanic,  Msk:  Symmetrical without gross deformities. Normal posture. Extremities:  Without edema. Neurologic:  Alert and  oriented x4 Psych:  Alert and cooperative. Normal mood and affect.  Intake/Output from previous day: 01/27 0701 - 01/28 0700 In: 1987.5 [I.V.:1837.5; IV Piggyback:150] Out: 575 [Urine:375; Emesis/NG output:200] Intake/Output this shift: No intake/output data recorded.  Lab Results: Recent Labs    12/06/17 0730 12/07/17 0551 12/08/17 0534  WBC 11.8* 10.0 8.4  HGB 10.8* 10.3* 10.5*  HCT 34.0* 32.0* 33.2*  PLT 238 226 250   BMET Recent Labs    12/07/17 0551 12/08/17 0534  NA 142 142  K 4.6 4.5  CL 109 108  CO2 21* 22  GLUCOSE 84 88  BUN 23* 23*  CREATININE 0.97 0.96  CALCIUM 8.8* 8.9   LFT Recent Labs    12/06/17 0730 12/07/17 0551  PROT 7.2 6.6  ALBUMIN 2.9* 2.6*  AST 44* 26  ALT 61 41  ALKPHOS 141* 113  BILITOT 1.7* 1.7*  BILIDIR 0.7*  --   IBILI 1.0*  --       Studies/Results: Dg Chest 1 View  Result Date: 12/06/2017 CLINICAL DATA:  Nasogastric tube placement EXAM: CHEST 1 VIEW COMPARISON:  4 days prior FINDINGS: Nasogastric tube tip reaches the stomach with side port at the GE  junction. Low lung volumes with bandlike opacity at the left base. Cardiomegaly. History of aortic valve replacement. No Kerley lines, effusion, or pneumothorax. IMPRESSION: 1. Nasogastric tube with tip at the proximal stomach. 2. Atelectasis at the left base. Electronically Signed   By: Marnee SpringJonathon  Watts M.D.   On: 12/06/2017 15:03   Dg Abd 1 View  Result Date: 12/07/2017 CLINICAL DATA:  Patient with history of small bowel obstruction. Abdominal tightness. EXAM: ABDOMEN - 1 VIEW COMPARISON:  Abdominal radiograph 12/06/2017. FINDINGS: Gas is demonstrated within the small bowel throughout the abdomen. Enteric tube tip and side-port project over the stomach. Interval increase in gaseous distention of the colon measuring up to 17 cm. Supine evaluation limited for the detection of free intraperitoneal air. Lumbar spine degenerative changes. IMPRESSION: Marked gaseous distention of the colon measuring up to 17 cm, increased from prior. Electronically Signed   By: Annia Beltrew  Davis M.D.   On: 12/07/2017 14:48   Dg Chest Port 1 View  Result Date: 12/07/2017 CLINICAL DATA:  Status post NG tube placement. EXAM: PORTABLE CHEST 1 VIEW COMPARISON:  Chest radiograph 12/06/2017. FINDINGS: Enteric tube tip and side-port project over the stomach. Stable cardiac and mediastinal contours status post median sternotomy. Focal consolidation left lower hemithorax. No pleural effusion or pneumothorax. Gaseous distended loops of bowel within the upper abdomen. IMPRESSION: Enteric tube tip and side-port project over the stomach. Gaseous distended loops of  bowel within the upper abdomen. Consider dedicated abdominal radiography. Focal opacity left lower hemithorax may represent atelectasis. Infection not excluded. Electronically Signed   By: Annia Belt M.D.   On: 12/07/2017 21:32   Dg Abd Portable 1v  Result Date: 12/06/2017 CLINICAL DATA:  Nausea, abdominal distension, recent ERCP and sphincterotomy with extraction of CBD stones EXAM:  PORTABLE ABDOMEN - 1 VIEW COMPARISON:  Portable exam 1042 hours compared to 12/02/2017 FINDINGS: Air-filled small bowel loops throughout abdomen. Gaseous distention of transverse colon up to 13.2 cm diameter. No definite bowel wall thickening. Bones demineralized with degenerative disc and facet disease changes of the lumbar spine. IMPRESSION: Gaseous distention of colon up to 13.2 cm diameter. Additional air-filled upper normal caliber small bowel loops. Electronically Signed   By: Ulyses Southward M.D.   On: 12/06/2017 12:19    Assessment: 72 year old male admitted with biliary pancreatitis, s/p ERCP with sphincterotomy and stone extraction. Clinical course complicated by colonic ileus. Rectal tube placed every 2 hours for decompression. Small flatus and stool expelled with rectal tube. Clinically improved from pancreatitis standpoint; however, ileus continues to persist. NG output slowed in past 24 hours per I/O documentation. KUB ordered for this morning per Dr. Henreitta Leber. No leukocytosis or worsening of discomfort.   Plan: Limit narcotic use Rectal tube every 2 hours Await KUB this morning Will continue to follow with you  Gelene Mink, PhD, ANP-BC [facility]    LOS: 6 days    12/08/2017, 7:57 AM

## 2017-12-08 NOTE — Progress Notes (Signed)
Rectal removed. Gas expelled. Pt tolerated well. Denies urge to have BM. Will continue to monitor.

## 2017-12-08 NOTE — Op Note (Signed)
[facility] Patient Name: [name] Procedure Date: 12/08/2017 3:18 PM MRN: [id number] Date of Birth: 08-12-1946 Attending MD: Jonette Eva MD, MD CSN: [id number] Age: 72 Admit Type: Inpatient Procedure:                Colonoscopy, DIAGNOSTIC Indications:              Abnormal CT of the GI tract: CECUM > 10 CM Providers:                Jonette Eva MD, MD, Jannett Celestine, RN, Dayton Scrape RN, RN, Edythe Clarity, Technician Referring MD:             Kathleene Hazel. Margo Aye MD Medicines:                Meperidine 50 mg IV, Midazolam 5 mg IV Complications:            No immediate complications. Estimated Blood Loss:     Estimated blood loss: none. Procedure:                Pre-Anesthesia Assessment:                           - Prior to the procedure, a History and Physical                            was performed, and patient medications and                            allergies were reviewed. The patient's tolerance of                            previous anesthesia was also reviewed. The risks                            and benefits of the procedure and the sedation                            options and risks were discussed with the patient.                            All questions were answered, and informed consent                            was obtained. Prior Anticoagulants: The patient has                            taken Lovenox (enoxaparin), last dose was 1 day                            prior to procedure. ASA Grade Assessment: III - A                            patient with severe systemic disease. After  reviewing the risks and benefits, the patient was                            deemed in satisfactory condition to undergo the                            procedure. After obtaining informed consent, the                            colonoscope was passed under direct vision.                            Throughout the procedure, the  patient's blood                            pressure, pulse, and oxygen saturations were                            monitored continuously. The [id number] ([id number])                            scope was introduced through the anus and advanced                            to the the PROXIMAL ascending colon. The                            colonoscopy was technically difficult and complex                            due to poor bowel prep and a tortuous colon.                            Successful completion of the procedure was aided by                            increasing the dose of sedation medication,                            straightening and shortening the scope to obtain                            bowel loop reduction and lavage. The patient                            tolerated the procedure well. The quality of the                            bowel preparation was poor. The ileocecal valve was                            photographed. Scope In: 3:45:07 PM Scope Out: 4:04:38 PM Total Procedure Duration: 0 hours 19 minutes 31 seconds  Findings:  Segmental mild inflammation characterized by congestion (edema),       erythema and friability was found in the ascending colon and in the       cecum. EXUDATE AND ERYTHEMA IN CECUM AND PROXIMAL ASCENDING COLON       CONSISTENT WITH COLIIS: ISCHEMIC V. INFECTIOUS.      The recto-sigmoid colon, sigmoid colon and descending colon were       moderately redundant. Impression:               - Preparation of the colon was poor.                           - Segmental mild inflammation was found in the                            ascending colon and in the cecum secondary to                            ischemic colitis.                           - Redundant LEFT colon.                           - ABDOMEN SOFT AND MILDLY DISTENDED AT END OF EXAM Moderate Sedation:      Moderate (conscious) sedation was administered by the endoscopy nurse       and  supervised by the endoscopist. The following parameters were       monitored: oxygen saturation, heart rate, blood pressure, and response       to care. Total physician intraservice time was 28 minutes. Recommendation:           - NPO.                           - Continue present medications. STOP MIRALAX,                            DILAUDID AND ZOFRAN. PHENERGAN PRN NAUSEA/VOMTIING.                            TYLENOL PRN PAIN. DULCOLAX 5 MG DILY@1800 . PROTONIX                            QHS. RE-START ZOSYN AND ADD FLAGYL.                           - Return patient to hospital ward for ongoing care.                           - Repeat colonoscopy at appointment to be scheduled                            because the examination was incomplete. Procedure Code(s):        --- Professional ---  16109, 53, Colonoscopy, flexible; diagnostic,                            including collection of specimen(s) by brushing or                            washing, when performed (separate procedure)                           99152, Moderate sedation services provided by the                            same physician or other qualified health care                            professional performing the diagnostic or                            therapeutic service that the sedation supports,                            requiring the presence of an independent trained                            observer to assist in the monitoring of the                            patient's level of consciousness and physiological                            status; initial 15 minutes of intraservice time,                            patient age 67 years or older                           [phone], Moderate sedation services; each additional                            15 minutes intraservice time Diagnosis Code(s):        --- Professional ---                           K55.9, Vascular disorder of intestine,  unspecified                           R93.3, Abnormal findings on diagnostic imaging of                            other parts of digestive tract                           Q43.8, Other specified congenital malformations of  intestine CPT copyright 2016 [employer]. All rights reserved. The codes documented in this report are preliminary and upon coder review may  be revised to meet current compliance requirements. Jonette Eva, MD Jonette Eva MD, MD 12/08/2017 4:23:17 PM This report has been signed electronically. Number of Addenda: 0

## 2017-12-08 NOTE — Progress Notes (Signed)
PT placed on Left Side with rectal tube in place. Tolerated well. To be removed at 0150 Continue to monitor. Gas expulsion heard.

## 2017-12-08 NOTE — Progress Notes (Addendum)
Tele reviewed, afib low 100s. Reasonable rates given ongoing systemic stress. Occasional PVCs. K is 4.5, Mg is 2 this AM. Continue beta blocker. With mildly elevated afib rates and some PVCs would increase lopressor to q 6hrs, bp's are stable. Would make sure to avoid holding IV lopressor doses prior to any procedures, prior tachycardia during ERCP. Anticoagulate as able based on GI procedures given prior embolic event. We will f/u tele tomorrow.    Dina RichJonathan Kyleen Villatoro MD

## 2017-12-08 NOTE — Progress Notes (Signed)
PROGRESS NOTE    [name]  ONG:[id number] DOB: [birth year] DOA: 12/02/2017 PCP: Benita Stabile, MD     Brief Narrative:  72 year old man admitted from home on 1/22 due to abdominal pain and emesis.  He was found to have a lipase of over 3000 with elevated LFTs.  CT of the abdomen and pelvis showed cholelithiasis with gallbladder wall thickening with dry and extrahepatic ductal dilatation as well as common bile duct dilatation.  Admission requested, GI and surgical consults were requested.  Patient's procedures have been delayed due to iatrogenic coagulopathy as patient is anticoagulated on Coumadin for A. fib.  Has been seen by cardiology who has cleared patient for procedures.  Patient is status post ERCP on 1/24.  Cholecystectomy is pending. Has increased abdominal distention and an ileus on xray.  On 1/28 there was increasing concern given his markedly distended colon to 17 cm on x-ray, Dr. Henreitta Leber was concern for possible volvulus.  CT scan was done negative for volvulus.  Dr. Darrick Penna has performed a colonoscopy for decompression, antibiotics have been restarted due to erythema of the cecal wall.   Assessment & Plan:   Active Problems:   Preoperative cardiovascular examination   Gallstone pancreatitis   Choledocholithiasis   Atrial fibrillation, chronic (HCC)   S/P AVR   Abnormal liver function tests   S/P ERCP   Ileus (HCC)   Gallstone pancreatitis/choledocholithiasis/cholelithiasis -Status post ERCP on 1/24 with successful sphincterotomy and balloon stone extraction. -LFTs continue to improve post ERCP. -Continue Zosyn. -Cholecystectomy has been delayed at this time due to ongoing ileus/megacolon.  Chronic atrial fibrillation -Rate is currently controlled.   -Continue lovenox for anticoagulation purposes. -IV Lopressor dose has been increased by cardiology today.  Ileus/Megacolon -Likely post-ERCP and 2/2 narcotic use. -Minimal improvement with rectal tube, required  colonoscopy for decompression on 1/28. -Consideration for neostigmine if no improvement. -Narcotics have been discontinued.  History of coronary artery disease -No chest pain at present, stable  Hypertension -Well-controlled  History of bioprosthetic aortic valve replacement   DVT prophylaxis: Full dose anticoagulation with Lovenox  Code Status: Full code Family Communication: Patient only Disposition Plan:[facility] to [facility] on 1/29 per family request. GI and surgery agree.  Consultants:   GI  Surgery  Procedures:   None  Antimicrobials:  Anti-infectives (From admission, onward)   Start     Dose/Rate Route Frequency Ordered Stop   12/08/17 1800  metroNIDAZOLE (FLAGYL) IVPB 500 mg     500 mg 100 mL/hr over 60 Minutes Intravenous Every 8 hours 12/08/17 1633     12/08/17 0600  cefoTEtan (CEFOTAN) 2 g in dextrose 5 % 50 mL IVPB  Status:  Discontinued     2 g 100 mL/hr over 30 Minutes Intravenous On call to O.R. 12/08/17 0009 12/08/17 1516   12/05/17 0600  ceFAZolin (ANCEF) IVPB 2g/100 mL premix     2 g 200 mL/hr over 30 Minutes Intravenous On call to O.R. 12/04/17 1455 12/06/17 0559   12/02/17 1000  piperacillin-tazobactam (ZOSYN) IVPB 3.375 g  Status:  Discontinued     3.375 g 12.5 mL/hr over 240 Minutes Intravenous Every 8 hours 12/02/17 0947 12/08/17 1440   12/02/17 0945  piperacillin-tazobactam (ZOSYN) IVPB 3.375 g  Status:  Discontinued     3.375 g 100 mL/hr over 30 Minutes Intravenous Every 8 hours 12/02/17 0941 12/02/17 0946       Subjective: Resting peacefully after colonoscopy today.  Objective: Vitals:   12/08/17 1555 12/08/17 1600 12/08/17 1605 12/08/17  1610  BP: (!) 135/114 (!) 136/97 117/80   Pulse: (!) 108 (!) 106 (!) 102 (!) 54  Resp: 18 (!) 22 15 14   Temp:      TempSrc:      SpO2: 100% 100% 99% 100%  Weight:      Height:        Intake/Output Summary (Last 24 hours) at 12/08/2017 1814 Last data filed at 12/08/2017 1700 Gross per 24 hour    Intake 2175 ml  Output 775 ml  Net 1400 ml   Filed Weights   12/02/17 0459 12/02/17 0923  Weight: 88 kg (194 lb) 88 kg (194 lb 0.1 oz)    Examination:  General exam: Alert, awake, oriented x 3 Respiratory system: Clear to auscultation. Respiratory effort normal. Cardiovascular system:RRR. No murmurs, rubs, gallops. Gastrointestinal system: Abdomen is less distended, soft and tender to palpation to the right upper quadrant and epigastric area. No organomegaly or masses felt. Decreased bowel sounds heard. Central nervous system: Alert and oriented. No focal neurological deficits. Extremities: No C/C/E, +pedal pulses Skin: No rashes, lesions or ulcers Psychiatry: Judgement and insight appear normal. Mood & affect appropriate.     Data Reviewed: I have personally reviewed following labs and imaging studies  CBC: Recent Labs  Lab 12/02/17 0517  12/04/17 0536 12/05/17 0617 12/06/17 0730 12/07/17 0551 12/08/17 0534  WBC 8.4   < > 6.0 8.9 11.8* 10.0 8.4  NEUTROABS 7.7  --  5.0  --   --   --   --   HGB 13.2   < > 10.7* 10.4* 10.8* 10.3* 10.5*  HCT 39.0   < > 33.2* 32.5* 34.0* 32.0* 33.2*  MCV 101.3*   < > 106.4* 106.6* 107.9* 107.7* 108.5*  PLT 221   < > 145* 190 238 226 250   < > = values in this interval not displayed.   Basic Metabolic Panel: Recent Labs  Lab 12/03/17 0435 12/04/17 0536 12/05/17 0617 12/07/17 0551 12/07/17 1341 12/08/17 0534  NA 139 137 134* 142  --  142  K 4.8 4.2 4.4 4.6  --  4.5  CL 106 107 104 109  --  108  CO2 22 20* 20* 21*  --  22  GLUCOSE 111* 105* 136* 84  --  88  BUN 24* 23* 26* 23*  --  23*  CREATININE 1.39* 1.06 1.12 0.97  --  0.96  CALCIUM 7.9* 8.0* 8.0* 8.8*  --  8.9  MG  --  1.6*  --   --  1.9 2.0  PHOS  --   --   --   --  2.5 3.0   GFR: Estimated Creatinine Clearance: 77.7 mL/min (by C-G formula based on SCr of 0.96 mg/dL). Liver Function Tests: Recent Labs  Lab 12/03/17 0435 12/04/17 0536 12/05/17 0617 12/06/17 0730  12/07/17 0551  AST 84* 110* 96*  95* 44* 26  ALT 67* 66* 78*  76* 61 41  ALKPHOS 148* 147* 159*  158* 141* 113  BILITOT 1.5* 4.3* 2.0*  2.1* 1.7* 1.7*  PROT 6.5 6.9 6.9  6.8 7.2 6.6  ALBUMIN 3.0* 2.9* 2.9*  2.9* 2.9* 2.6*   Recent Labs  Lab 12/02/17 0517 12/03/17 0900 12/04/17 0536 12/06/17 0730  LIPASE 3,298* 1,375* 329* 79*   No results for input(s): AMMONIA in the last 168 hours. Coagulation Profile: Recent Labs  Lab 12/02/17 0517 12/03/17 0435 12/04/17 0536  INR 2.14 1.98 1.30   Cardiac Enzymes: No results for input(s): CKTOTAL,  CKMB, CKMBINDEX, TROPONINI in the last 168 hours. BNP (last 3 results) No results for input(s): PROBNP in the last 8760 hours. HbA1C: No results for input(s): HGBA1C in the last 72 hours. CBG: No results for input(s): GLUCAP in the last 168 hours. Lipid Profile: No results for input(s): CHOL, HDL, LDLCALC, TRIG, CHOLHDL, LDLDIRECT in the last 72 hours. Thyroid Function Tests: No results for input(s): TSH, T4TOTAL, FREET4, T3FREE, THYROIDAB in the last 72 hours. Anemia Panel: No results for input(s): VITAMINB12, FOLATE, FERRITIN, TIBC, IRON, RETICCTPCT in the last 72 hours. Urine analysis:    Component Value Date/Time   COLORURINE YELLOW 12/02/2017 2335   APPEARANCEUR CLEAR 12/02/2017 2335   LABSPEC 1.036 (H) 12/02/2017 2335   PHURINE 5.0 12/02/2017 2335   GLUCOSEU NEGATIVE 12/02/2017 2335   HGBUR SMALL (A) 12/02/2017 2335   BILIRUBINUR NEGATIVE 12/02/2017 2335   KETONESUR NEGATIVE 12/02/2017 2335   PROTEINUR NEGATIVE 12/02/2017 2335   UROBILINOGEN 1.0 07/14/2012 1243   NITRITE NEGATIVE 12/02/2017 2335   LEUKOCYTESUR NEGATIVE 12/02/2017 2335   Sepsis Labs: @LABRCNTIP (procalcitonin:4,lacticidven:4)  ) Recent Results (from the past 240 hour(s))  Surgical pcr screen     Status: None   Collection Time: 12/06/17 10:01 AM  Result Value Ref Range Status   MRSA, PCR NEGATIVE NEGATIVE Final   Staphylococcus aureus NEGATIVE  NEGATIVE Final    Comment: (NOTE) The Xpert SA Assay (FDA approved for NASAL specimens in patients 80 years of age and older), is one component of a comprehensive surveillance program. It is not intended to diagnose infection nor to guide or monitor treatment.          Radiology Studies: Dg Abd 1 View  Result Date: 12/08/2017 CLINICAL DATA:  Ileus. EXAM: ABDOMEN - 1 VIEW COMPARISON:  Radiograph of December 07, 2017. FINDINGS: Nasogastric tube tip is seen in expected position of distal stomach. No small bowel dilatation is noted. Distention of colon is again noted measuring 18 cm, which is not significantly changed compared to prior exam. IMPRESSION: Grossly stable colonic dilatation as described above. Electronically Signed   By: Lupita Raider, M.D.   On: 12/08/2017 10:24   Dg Abd 1 View  Result Date: 12/07/2017 CLINICAL DATA:  Patient with history of small bowel obstruction. Abdominal tightness. EXAM: ABDOMEN - 1 VIEW COMPARISON:  Abdominal radiograph 12/06/2017. FINDINGS: Gas is demonstrated within the small bowel throughout the abdomen. Enteric tube tip and side-port project over the stomach. Interval increase in gaseous distention of the colon measuring up to 17 cm. Supine evaluation limited for the detection of free intraperitoneal air. Lumbar spine degenerative changes. IMPRESSION: Marked gaseous distention of the colon measuring up to 17 cm, increased from prior. Electronically Signed   By: Annia Belt M.D.   On: 12/07/2017 14:48   Ct Abdomen Pelvis W Contrast  Result Date: 12/08/2017 CLINICAL DATA:  Abdominal pain and distention. Evaluate for volvulus. EXAM: CT ABDOMEN AND PELVIS WITH CONTRAST TECHNIQUE: Multidetector CT imaging of the abdomen and pelvis was performed using the standard protocol following bolus administration of intravenous contrast. CONTRAST:  ISOVUE-300 IOPAMIDOL (ISOVUE-300) INJECTION 61% COMPARISON:  12/02/2017 FINDINGS: Lower chest: Dependent changes are  noted in both lung bases. Small right pleural effusion. Hepatobiliary: No suspicious liver abnormality. Tiny stones noted within the gallbladder. Pancreas: There is diffuse edema and inflammation involving the pancreas. Most severe at the head through body compatible acute pancreatitis. There is surrounding fat stranding identified. No pseudocyst identified. Spleen: Normal in size without focal abnormality. Adrenals/Urinary Tract: The  adrenal glands are normal. The right kidney is normal. Tiny cyst arises from the upper pole the left kidney. No hydronephrosis. Urinary bladder appears normal. Stomach/Bowel: Enteric tube is identified within the stomach. The small bowel loops have a normal course and caliber. There is a large mobile cecum which extends across the midline to the left upper quadrant of the abdomen. The ileocecal valve is identified within the left abdomen, image 37 of series 3. The distended cecum measures up to 10.4 cm and has air-contrast level. Air-filled loop of transverse colon is identified. Transition to normal to decreased caliber: Is identified at the level of the splenic flexure. Distal colonic diverticula noted without acute inflammation. Vascular/Lymphatic: Aortic atherosclerosis. No aneurysm. No upper abdominal or pelvic adenopathy. Reproductive: Prostate is unremarkable. Other: Trace ascites identified within the upper abdomen. No focal fluid collections. Musculoskeletal: There is degenerative disc disease identified throughout the lumbar spine. No suspicious bone lesions. IMPRESSION: 1. Changes of acute pancreatitis involving the head through body of pancreas noted. No evidence for pseudocyst formation. Tiny gallstones are identified within the gallbladder. No definite stones within the common bile duct. 2. Abnormal distension of the cecum through the transverse colon to the level of the splenic flexure. No evidence for cecal volvulus. Favor colonic ileus. 3.  Aortic Atherosclerosis  (ICD10-I70.0). 4. Small right effusion. Dependent changes noted within both lung bases. Electronically Signed   By: Signa Kell M.D.   On: 12/08/2017 12:46   Dg Chest Port 1 View  Result Date: 12/07/2017 CLINICAL DATA:  Status post NG tube placement. EXAM: PORTABLE CHEST 1 VIEW COMPARISON:  Chest radiograph 12/06/2017. FINDINGS: Enteric tube tip and side-port project over the stomach. Stable cardiac and mediastinal contours status post median sternotomy. Focal consolidation left lower hemithorax. No pleural effusion or pneumothorax. Gaseous distended loops of bowel within the upper abdomen. IMPRESSION: Enteric tube tip and side-port project over the stomach. Gaseous distended loops of bowel within the upper abdomen. Consider dedicated abdominal radiography. Focal opacity left lower hemithorax may represent atelectasis. Infection not excluded. Electronically Signed   By: Annia Belt M.D.   On: 12/07/2017 21:32        Scheduled Meds: . bisacodyl  5 mg Oral QPM  . enoxaparin (LOVENOX) injection  90 mg Subcutaneous Q12H  . meperidine      . metoprolol tartrate  5 mg Intravenous Q6H  . midazolam      . pantoprazole (PROTONIX) IV  40 mg Intravenous QHS   Continuous Infusions: . 0.9 % NaCl with KCl 20 mEq / L 75 mL/hr at 12/08/17 0812  . metronidazole 500 mg (12/08/17 1700)     LOS: 6 days    Time spent: 25 minutes. Greater than 50% of this time was spent in direct contact with the patient coordinating care.     Chaya Jan, MD [facility] Pager [phone]  If 7PM-7AM, please contact night-coverage [email or web address] Password [facility] 12/08/2017, 6:14 PM

## 2017-12-08 NOTE — Progress Notes (Signed)
Small amount of liquid stool expelled via rectal tube. Rectal tube removed. Pt tolerated well. Will continue to monitor. Abdomen continues to be grossly distended and taut.

## 2017-12-08 NOTE — Progress Notes (Signed)
Small amount of liquid stool expelled via rectal tube. Rectal tube removed. Pt tolerated well. Will continue to monitor.

## 2017-12-09 LAB — BASIC METABOLIC PANEL
Anion gap: 10 (ref 5–15)
BUN: 22 mg/dL — AB (ref 6–20)
CHLORIDE: 111 mmol/L (ref 101–111)
CO2: 23 mmol/L (ref 22–32)
CREATININE: 0.91 mg/dL (ref 0.61–1.24)
Calcium: 9.2 mg/dL (ref 8.9–10.3)
GFR calc Af Amer: >60 mL/min (ref >60)
GFR calc non Af Amer: >60 mL/min (ref >60)
GLUCOSE: 73 mg/dL (ref 65–99)
POTASSIUM: 4.5 mmol/L (ref 3.5–5.1)
SODIUM: 144 mmol/L (ref 135–145)

## 2017-12-09 LAB — PHOSPHORUS: Phosphorus: 3.7 mg/dL (ref 2.5–4.6)

## 2017-12-09 LAB — MAGNESIUM: Magnesium: 1.6 mg/dL — ABNORMAL LOW (ref 1.7–2.4)

## 2017-12-09 NOTE — Progress Notes (Signed)
Subjective: Less distension. Notes liquid stool this morning. Feels improved from yesterday. NG tube to low wall suction. 100 ml documented as NG output this morning past 24 hours. Patient and wife desire to be transferred to [facility].   Objective: Vital signs in last 24 hours: Temp:  [97.8 F (36.6 C)-98.5 F (36.9 C)] 98.5 F (36.9 C) (01/29 0300) Pulse Rate:  [54-109] 98 (01/29 0300) Resp:  [12-24] 15 (01/29 0300) BP: (117-156)/(76-117) 125/86 (01/29 0300) SpO2:  [97 %-100 %] 98 % (01/29 0300) Last BM Date: 12/07/17 General:   Alert and oriented, pleasant Head:  Normocephalic and atraumatic. Abdomen:  Bowel sounds present but hypoactive, abdomen less distended but still round, soft, no TTP Neurologic:  Alert and  oriented x4 Psych:  Alert and cooperative. Normal mood and affect.  Intake/Output from previous day: 01/28 0701 - 01/29 0700 In: 1081.3 [I.V.:931.3; IV Piggyback:150] Out: 675 [Urine:575; Emesis/NG output:100] Intake/Output this shift: No intake/output data recorded.  Lab Results: Recent Labs    12/07/17 0551 12/08/17 0534  WBC 10.0 8.4  HGB 10.3* 10.5*  HCT 32.0* 33.2*  PLT 226 250   BMET Recent Labs    12/07/17 0551 12/08/17 0534 12/09/17 0436  NA 142 142 144  K 4.6 4.5 4.5  CL 109 108 111  CO2 21* 22 23  GLUCOSE 84 88 73  BUN 23* 23* 22*  CREATININE 0.97 0.96 0.91  CALCIUM 8.8* 8.9 9.2   LFT Recent Labs    12/07/17 0551  PROT 6.6  ALBUMIN 2.6*  AST 26  ALT 41  ALKPHOS 113  BILITOT 1.7*     Studies/Results: Dg Abd 1 View  Result Date: 12/08/2017 CLINICAL DATA:  Ileus. EXAM: ABDOMEN - 1 VIEW COMPARISON:  Radiograph of December 07, 2017. FINDINGS: Nasogastric tube tip is seen in expected position of distal stomach. No small bowel dilatation is noted. Distention of colon is again noted measuring 18 cm, which is not significantly changed compared to prior exam. IMPRESSION: Grossly stable colonic dilatation as described above.  Electronically Signed   By: Lupita Raider, M.D.   On: 12/08/2017 10:24   Dg Abd 1 View  Result Date: 12/07/2017 CLINICAL DATA:  Patient with history of small bowel obstruction. Abdominal tightness. EXAM: ABDOMEN - 1 VIEW COMPARISON:  Abdominal radiograph 12/06/2017. FINDINGS: Gas is demonstrated within the small bowel throughout the abdomen. Enteric tube tip and side-port project over the stomach. Interval increase in gaseous distention of the colon measuring up to 17 cm. Supine evaluation limited for the detection of free intraperitoneal air. Lumbar spine degenerative changes. IMPRESSION: Marked gaseous distention of the colon measuring up to 17 cm, increased from prior. Electronically Signed   By: Annia Belt M.D.   On: 12/07/2017 14:48   Ct Abdomen Pelvis W Contrast  Result Date: 12/08/2017 CLINICAL DATA:  Abdominal pain and distention. Evaluate for volvulus. EXAM: CT ABDOMEN AND PELVIS WITH CONTRAST TECHNIQUE: Multidetector CT imaging of the abdomen and pelvis was performed using the standard protocol following bolus administration of intravenous contrast. CONTRAST:  ISOVUE-300 IOPAMIDOL (ISOVUE-300) INJECTION 61% COMPARISON:  12/02/2017 FINDINGS: Lower chest: Dependent changes are noted in both lung bases. Small right pleural effusion. Hepatobiliary: No suspicious liver abnormality. Tiny stones noted within the gallbladder. Pancreas: There is diffuse edema and inflammation involving the pancreas. Most severe at the head through body compatible acute pancreatitis. There is surrounding fat stranding identified. No pseudocyst identified. Spleen: Normal in size without focal abnormality. Adrenals/Urinary Tract: The adrenal glands  are normal. The right kidney is normal. Tiny cyst arises from the upper pole the left kidney. No hydronephrosis. Urinary bladder appears normal. Stomach/Bowel: Enteric tube is identified within the stomach. The small bowel loops have a normal course and caliber. There is a  large mobile cecum which extends across the midline to the left upper quadrant of the abdomen. The ileocecal valve is identified within the left abdomen, image 37 of series 3. The distended cecum measures up to 10.4 cm and has air-contrast level. Air-filled loop of transverse colon is identified. Transition to normal to decreased caliber: Is identified at the level of the splenic flexure. Distal colonic diverticula noted without acute inflammation. Vascular/Lymphatic: Aortic atherosclerosis. No aneurysm. No upper abdominal or pelvic adenopathy. Reproductive: Prostate is unremarkable. Other: Trace ascites identified within the upper abdomen. No focal fluid collections. Musculoskeletal: There is degenerative disc disease identified throughout the lumbar spine. No suspicious bone lesions. IMPRESSION: 1. Changes of acute pancreatitis involving the head through body of pancreas noted. No evidence for pseudocyst formation. Tiny gallstones are identified within the gallbladder. No definite stones within the common bile duct. 2. Abnormal distension of the cecum through the transverse colon to the level of the splenic flexure. No evidence for cecal volvulus. Favor colonic ileus. 3.  Aortic Atherosclerosis (ICD10-I70.0). 4. Small right effusion. Dependent changes noted within both lung bases. Electronically Signed   By: Signa Kellaylor  Stroud M.D.   On: 12/08/2017 12:46   Dg Chest Port 1 View  Result Date: 12/07/2017 CLINICAL DATA:  Status post NG tube placement. EXAM: PORTABLE CHEST 1 VIEW COMPARISON:  Chest radiograph 12/06/2017. FINDINGS: Enteric tube tip and side-port project over the stomach. Stable cardiac and mediastinal contours status post median sternotomy. Focal consolidation left lower hemithorax. No pleural effusion or pneumothorax. Gaseous distended loops of bowel within the upper abdomen. IMPRESSION: Enteric tube tip and side-port project over the stomach. Gaseous distended loops of bowel within the upper abdomen.  Consider dedicated abdominal radiography. Focal opacity left lower hemithorax may represent atelectasis. Infection not excluded. Electronically Signed   By: Annia Beltrew  Davis M.D.   On: 12/07/2017 21:32    Assessment: 72 year old male admitted with biliary pancreatitis, s/p ERCP with sphincterotomy and stone extraction. Clinical course complicated by persistent colonic ileus that failed conservative measures and required colonoscopy yesterday afternoon for decompression. Segmental mild inflammation in ascending colon and cecum, differentials including ischemic vs infectious colitis. Flagyl restarted yesterday evening. Clinically improved from pancreatitis standpoint and ultimately needs cholecystectomy. [facility] and orders have been placed per hospitalist to start transfer process.   Plan: May have ice chips Continue Flagyl [facility] please let GI know which location so patient's case may be discussed with receiving GI   Gelene MinkAnna W. Rondel Episcopo, PhD, ANP-BC [facility]    LOS: 7 days    12/09/2017, 8:08 AM

## 2017-12-09 NOTE — Progress Notes (Signed)
PROGRESS NOTE    [name]  ZOX:[id number] DOB: 07-24-1946 DOA: 12/02/2017 PCP: Benita StabileHall, John Z, MD     Brief Narrative:  72 year old man admitted from home on 1/22 due to abdominal pain and emesis.  He was found to have a lipase of over 3000 with elevated LFTs.  CT of the abdomen and pelvis showed cholelithiasis with gallbladder wall thickening with dry and extrahepatic ductal dilatation as well as common bile duct dilatation.  Admission requested, GI and surgical consults were requested.  Patient's procedures have been delayed due to iatrogenic coagulopathy as patient is anticoagulated on Coumadin for A. fib.  Has been seen by cardiology who has cleared patient for procedures.  Patient is status post ERCP on 1/24.  His cholecystectomy was to be done the next day, however during ERCP he had some uncontrolled heart rates into the 190s with his A. fib and anesthesiology canceled the procedure.  Cholecystectomy is pending. Has increased abdominal distention and an ileus on xray.  On 1/28 there was increasing concern given his markedly distended colon to 17 cm on x-ray, Dr. Henreitta LeberBridges was concerned for possible volvulus.  CT scan was done negative for volvulus.  Dr. Darrick PennaFields has performed a colonoscopy for decompression, antibiotics have been restarted due to erythema of the cecal wall and concern  for some mild ischemic colitis.  [facility] and [facility].   Assessment & Plan:   Active Problems:   Preoperative cardiovascular examination   Gallstone pancreatitis   Choledocholithiasis   Atrial fibrillation, chronic (HCC)   S/P AVR   Abnormal liver function tests   S/P ERCP   Ileus (HCC)   Gallstone pancreatitis/choledocholithiasis/cholelithiasis -Status post ERCP on 1/24 with successful sphincterotomy and balloon stone extraction. -LFTs continue to improve post ERCP. -Continue Zosyn. -Cholecystectomy has been delayed at this time due to ongoing  ileus/megacolon.  Per surgery may need to be done as an outpatient.  Chronic atrial fibrillation -Rate is currently controlled.   -Continue lovenox for anticoagulation purposes. -Continue current doses of IV Lopressor given n.p.o. state.  Ileus/Megacolon -Likely post-ERCP and 2/2 narcotic use. -Minimal improvement with rectal tube and NG tube, required colonoscopy for decompression on 1/28. -Consideration for neostigmine if no improvement; however has had significant improvement following colonoscopic decompression on 1/28. -Narcotics have been discontinued.  History of coronary artery disease -No chest pain at present, stable  Hypertension -Well-controlled  History of bioprosthetic aortic valve replacement   DVT prophylaxis: Full dose anticoagulation with Lovenox  Code Status: Full code Family Communication: Patient only Disposition Plan:[facility] family request. GI and surgery agree.  They have have both contacted their respective partners in [place] for consultative purposes.  Consultants:   GI  Surgery  Procedures:   None  Antimicrobials:  Anti-infectives (From admission, onward)   Start     Dose/Rate Route Frequency Ordered Stop   12/08/17 1800  metroNIDAZOLE (FLAGYL) IVPB 500 mg     500 mg 100 mL/hr over 60 Minutes Intravenous Every 8 hours 12/08/17 1633     12/08/17 0600  cefoTEtan (CEFOTAN) 2 g in dextrose 5 % 50 mL IVPB  Status:  Discontinued     2 g 100 mL/hr over 30 Minutes Intravenous On call to O.R. 12/08/17 0009 12/08/17 1516   12/05/17 0600  ceFAZolin (ANCEF) IVPB 2g/100 mL premix     2 g 200 mL/hr over 30 Minutes Intravenous On call to O.R. 12/04/17 1455 12/06/17 0559  12/02/17 1000  piperacillin-tazobactam (ZOSYN) IVPB 3.375 g  Status:  Discontinued     3.375 g 12.5 mL/hr over 240 Minutes Intravenous Every 8 hours 12/02/17 0947 12/08/17 1440   12/02/17 0945  piperacillin-tazobactam (ZOSYN) IVPB 3.375 g  Status:  Discontinued       3.375 g 100 mL/hr over 30 Minutes Intravenous Every 8 hours 12/02/17 0941 12/02/17 0946       Subjective: Lying in bed, states he feels much better, has had a small amount of liquid stool and has passed a significant amount of flatus today.  Objective: Vitals:   12/08/17 1900 12/09/17 0023 12/09/17 0300 12/09/17 1221  BP: 127/76 130/85 125/86 131/75  Pulse: 91 (!) 102 98 99  Resp: 14  15 16   Temp:   98.5 F (36.9 C) 98 F (36.7 C)  TempSrc:   Oral Oral  SpO2: 99%  98% 98%  Weight:      Height:        Intake/Output Summary (Last 24 hours) at 12/09/2017 1328 Last data filed at 12/09/2017 0908 Gross per 24 hour  Intake 775 ml  Output 475 ml  Net 300 ml   Filed Weights   12/02/17 0459 12/02/17 0923  Weight: 88 kg (194 lb) 88 kg (194 lb 0.1 oz)    Examination:  General exam: Alert, awake, oriented x 3 Respiratory system: Clear to auscultation. Respiratory effort normal. Cardiovascular system:RRR. No murmurs, rubs, gallops. Gastrointestinal system: Abdomen is less distended, soft and tender to palpation to the right upper quadrant and epigastric area. No organomegaly or masses felt. Decreased bowel sounds heard. Central nervous system: Alert and oriented. No focal neurological deficits. Extremities: No C/C/E, +pedal pulses Skin: No rashes, lesions or ulcers Psychiatry: Judgement and insight appear normal. Mood & affect appropriate.     Data Reviewed: I have personally reviewed following labs and imaging studies  CBC: Recent Labs  Lab 12/04/17 0536 12/05/17 0617 12/06/17 0730 12/07/17 0551 12/08/17 0534  WBC 6.0 8.9 11.8* 10.0 8.4  NEUTROABS 5.0  --   --   --   --   HGB 10.7* 10.4* 10.8* 10.3* 10.5*  HCT 33.2* 32.5* 34.0* 32.0* 33.2*  MCV 106.4* 106.6* 107.9* 107.7* 108.5*  PLT 145* 190 238 226 250   Basic Metabolic Panel: Recent Labs  Lab 12/04/17 0536 12/05/17 0617 12/07/17 0551 12/07/17 1341 12/08/17 0534 12/09/17 0436  NA 137 134* 142  --  142  144  K 4.2 4.4 4.6  --  4.5 4.5  CL 107 104 109  --  108 111  CO2 20* 20* 21*  --  22 23  GLUCOSE 105* 136* 84  --  88 73  BUN 23* 26* 23*  --  23* 22*  CREATININE 1.06 1.12 0.97  --  0.96 0.91  CALCIUM 8.0* 8.0* 8.8*  --  8.9 9.2  MG 1.6*  --   --  1.9 2.0 1.6*  PHOS  --   --   --  2.5 3.0 3.7   GFR: Estimated Creatinine Clearance: 82 mL/min (by C-G formula based on SCr of 0.91 mg/dL). Liver Function Tests: Recent Labs  Lab 12/03/17 0435 12/04/17 0536 12/05/17 0617 12/06/17 0730 12/07/17 0551  AST 84* 110* 96*  95* 44* 26  ALT 67* 66* 78*  76* 61 41  ALKPHOS 148* 147* 159*  158* 141* 113  BILITOT 1.5* 4.3* 2.0*  2.1* 1.7* 1.7*  PROT 6.5 6.9 6.9  6.8 7.2 6.6  ALBUMIN 3.0* 2.9* 2.9*  2.9* 2.9* 2.6*   Recent Labs  Lab 12/03/17 0900 12/04/17 0536 12/06/17 0730  LIPASE 1,375* 329* 79*   No results for input(s): AMMONIA in the last 168 hours. Coagulation Profile: Recent Labs  Lab 12/03/17 0435 12/04/17 0536  INR 1.98 1.30   Cardiac Enzymes: No results for input(s): CKTOTAL, CKMB, CKMBINDEX, TROPONINI in the last 168 hours. BNP (last 3 results) No results for input(s): PROBNP in the last 8760 hours. HbA1C: No results for input(s): HGBA1C in the last 72 hours. CBG: No results for input(s): GLUCAP in the last 168 hours. Lipid Profile: No results for input(s): CHOL, HDL, LDLCALC, TRIG, CHOLHDL, LDLDIRECT in the last 72 hours. Thyroid Function Tests: No results for input(s): TSH, T4TOTAL, FREET4, T3FREE, THYROIDAB in the last 72 hours. Anemia Panel: No results for input(s): VITAMINB12, FOLATE, FERRITIN, TIBC, IRON, RETICCTPCT in the last 72 hours. Urine analysis:    Component Value Date/Time   COLORURINE YELLOW 12/02/2017 2335   APPEARANCEUR CLEAR 12/02/2017 2335   LABSPEC 1.036 (H) 12/02/2017 2335   PHURINE 5.0 12/02/2017 2335   GLUCOSEU NEGATIVE 12/02/2017 2335   HGBUR SMALL (A) 12/02/2017 2335   BILIRUBINUR NEGATIVE 12/02/2017 2335   KETONESUR  NEGATIVE 12/02/2017 2335   PROTEINUR NEGATIVE 12/02/2017 2335   UROBILINOGEN 1.0 07/14/2012 1243   NITRITE NEGATIVE 12/02/2017 2335   LEUKOCYTESUR NEGATIVE 12/02/2017 2335   Sepsis Labs: @LABRCNTIP (procalcitonin:4,lacticidven:4)  ) Recent Results (from the past 240 hour(s))  Surgical pcr screen     Status: None   Collection Time: 12/06/17 10:01 AM  Result Value Ref Range Status   MRSA, PCR NEGATIVE NEGATIVE Final   Staphylococcus aureus NEGATIVE NEGATIVE Final    Comment: (NOTE) The Xpert SA Assay (FDA approved for NASAL specimens in patients 70 years of age and older), is one component of a comprehensive surveillance program. It is not intended to diagnose infection nor to guide or monitor treatment.          Radiology Studies: Dg Abd 1 View  Result Date: 12/08/2017 CLINICAL DATA:  Ileus. EXAM: ABDOMEN - 1 VIEW COMPARISON:  Radiograph of December 07, 2017. FINDINGS: Nasogastric tube tip is seen in expected position of distal stomach. No small bowel dilatation is noted. Distention of colon is again noted measuring 18 cm, which is not significantly changed compared to prior exam. IMPRESSION: Grossly stable colonic dilatation as described above. Electronically Signed   By: Lupita Raider, M.D.   On: 12/08/2017 10:24   Dg Abd 1 View  Result Date: 12/07/2017 CLINICAL DATA:  Patient with history of small bowel obstruction. Abdominal tightness. EXAM: ABDOMEN - 1 VIEW COMPARISON:  Abdominal radiograph 12/06/2017. FINDINGS: Gas is demonstrated within the small bowel throughout the abdomen. Enteric tube tip and side-port project over the stomach. Interval increase in gaseous distention of the colon measuring up to 17 cm. Supine evaluation limited for the detection of free intraperitoneal air. Lumbar spine degenerative changes. IMPRESSION: Marked gaseous distention of the colon measuring up to 17 cm, increased from prior. Electronically Signed   By: Annia Belt M.D.   On: 12/07/2017 14:48    Ct Abdomen Pelvis W Contrast  Result Date: 12/08/2017 CLINICAL DATA:  Abdominal pain and distention. Evaluate for volvulus. EXAM: CT ABDOMEN AND PELVIS WITH CONTRAST TECHNIQUE: Multidetector CT imaging of the abdomen and pelvis was performed using the standard protocol following bolus administration of intravenous contrast. CONTRAST:  ISOVUE-300 IOPAMIDOL (ISOVUE-300) INJECTION 61% COMPARISON:  12/02/2017 FINDINGS: Lower chest: Dependent changes are noted in both lung  bases. Small right pleural effusion. Hepatobiliary: No suspicious liver abnormality. Tiny stones noted within the gallbladder. Pancreas: There is diffuse edema and inflammation involving the pancreas. Most severe at the head through body compatible acute pancreatitis. There is surrounding fat stranding identified. No pseudocyst identified. Spleen: Normal in size without focal abnormality. Adrenals/Urinary Tract: The adrenal glands are normal. The right kidney is normal. Tiny cyst arises from the upper pole the left kidney. No hydronephrosis. Urinary bladder appears normal. Stomach/Bowel: Enteric tube is identified within the stomach. The small bowel loops have a normal course and caliber. There is a large mobile cecum which extends across the midline to the left upper quadrant of the abdomen. The ileocecal valve is identified within the left abdomen, image 37 of series 3. The distended cecum measures up to 10.4 cm and has air-contrast level. Air-filled loop of transverse colon is identified. Transition to normal to decreased caliber: Is identified at the level of the splenic flexure. Distal colonic diverticula noted without acute inflammation. Vascular/Lymphatic: Aortic atherosclerosis. No aneurysm. No upper abdominal or pelvic adenopathy. Reproductive: Prostate is unremarkable. Other: Trace ascites identified within the upper abdomen. No focal fluid collections. Musculoskeletal: There is degenerative disc disease identified throughout the  lumbar spine. No suspicious bone lesions. IMPRESSION: 1. Changes of acute pancreatitis involving the head through body of pancreas noted. No evidence for pseudocyst formation. Tiny gallstones are identified within the gallbladder. No definite stones within the common bile duct. 2. Abnormal distension of the cecum through the transverse colon to the level of the splenic flexure. No evidence for cecal volvulus. Favor colonic ileus. 3.  Aortic Atherosclerosis (ICD10-I70.0). 4. Small right effusion. Dependent changes noted within both lung bases. Electronically Signed   By: Signa Kell M.D.   On: 12/08/2017 12:46   Dg Chest Port 1 View  Result Date: 12/07/2017 CLINICAL DATA:  Status post NG tube placement. EXAM: PORTABLE CHEST 1 VIEW COMPARISON:  Chest radiograph 12/06/2017. FINDINGS: Enteric tube tip and side-port project over the stomach. Stable cardiac and mediastinal contours status post median sternotomy. Focal consolidation left lower hemithorax. No pleural effusion or pneumothorax. Gaseous distended loops of bowel within the upper abdomen. IMPRESSION: Enteric tube tip and side-port project over the stomach. Gaseous distended loops of bowel within the upper abdomen. Consider dedicated abdominal radiography. Focal opacity left lower hemithorax may represent atelectasis. Infection not excluded. Electronically Signed   By: Annia Belt M.D.   On: 12/07/2017 21:32        Scheduled Meds: . bisacodyl  5 mg Oral QPM  . enoxaparin (LOVENOX) injection  90 mg Subcutaneous Q12H  . metoprolol tartrate  5 mg Intravenous Q6H  . pantoprazole (PROTONIX) IV  40 mg Intravenous QHS   Continuous Infusions: . 0.9 % NaCl with KCl 20 mEq / L 75 mL/hr at 12/09/17 0024  . metronidazole Stopped (12/09/17 1006)     LOS: 7 days    Time spent: 25 minutes. Greater than 50% of this time was spent in direct contact with the patient coordinating care.     Chaya Jan, MD [facility] Pager  [phone]  If 7PM-7AM, please contact night-coverage [email or web address] Password [email or web address] 12/09/2017, 1:28 PM

## 2017-12-09 NOTE — Care Management Note (Signed)
Case Management Note  Patient Details  Name: [name] MRN: [id number] Date of Birth: Jul 28, 1946  If discussed at Long Length of Stay Meetings, dates discussed:  12/09/17   Malcolm Metrohildress, Jaci Desanto Demske, RN 12/09/2017, 12:57 PM

## 2017-12-09 NOTE — Progress Notes (Signed)
[facility]  Patient being transferred per families request. Gallstone pancreatitis s/p ERCP 1/24 with complications of uncontrolled A fib (190s-200s during procedure) that precluded him from getting his lap chole on 1/25. Plans were made for doing the lap chole on 1/28 after receiving higher dosed beta blocker but over the weekend the patient developed a pretty severe colonic ileus and underwent colonoscopic decompression 1/28 after CT scan to prove that he had not developed a volvulus.    He tolerated the decompression well. The plan is for him to continue to cool off from the pancreatitis, which looked fairly inflamed in the head and will potentially end up developing a pseudocyst.  At this time, I have even discussed the likelihood of the patient undergoing an outpatient cholecystectomy with lovenox bridge if needed, given the extent of his pancreatitis and ileus. This will depend on his clinical progression the next few days.   The family is requesting transfer at this time due to his cardiac history, and their continued concerns about him needing more advanced cardiac care. 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   Spoke with [place], [place] [facility] to give them a heads up about the transfer.   Algis GreenhouseLindsay Brianna Bennett, MD [facility] [address] [place], [place] [id number] [phone] (office)

## 2017-12-10 ENCOUNTER — Encounter (HOSPITAL_COMMUNITY)
Admission: EM | Disposition: A | Discharge status: Home / Self Care | Payer: Self-pay | Source: Home / Self Care | Attending: Internal Medicine

## 2017-12-10 ENCOUNTER — Inpatient Hospital Stay (HOSPITAL_COMMUNITY): Payer: Medicare Other

## 2017-12-10 LAB — SURGICAL PCR SCREEN
MRSA, PCR: NEGATIVE
Staphylococcus aureus: NEGATIVE

## 2017-12-10 LAB — COMPREHENSIVE METABOLIC PANEL
ALBUMIN: 2.3 g/dL — AB (ref 3.5–5.0)
ALK PHOS: 98 U/L (ref 38–126)
ALT: 23 U/L (ref 17–63)
AST: 21 U/L (ref 15–41)
Anion gap: 13 (ref 5–15)
BUN: 17 mg/dL (ref 6–20)
CALCIUM: 8.9 mg/dL (ref 8.9–10.3)
CO2: 20 mmol/L — AB (ref 22–32)
CREATININE: 1.1 mg/dL (ref 0.61–1.24)
Chloride: 110 mmol/L (ref 101–111)
GFR calc Af Amer: >60 mL/min (ref >60)
GFR calc non Af Amer: >60 mL/min (ref >60)
GLUCOSE: 56 mg/dL — AB (ref 65–99)
Potassium: 4.3 mmol/L (ref 3.5–5.1)
SODIUM: 143 mmol/L (ref 135–145)
Total Bilirubin: 1.6 mg/dL — ABNORMAL HIGH (ref 0.3–1.2)
Total Protein: 5.9 g/dL — ABNORMAL LOW (ref 6.5–8.1)

## 2017-12-10 LAB — CBC
HCT: 30.8 % — ABNORMAL LOW (ref 39.0–52.0)
HEMOGLOBIN: 10 g/dL — AB (ref 13.0–17.0)
MCH: 34.5 pg — AB (ref 26.0–34.0)
MCHC: 32.5 g/dL (ref 30.0–36.0)
MCV: 106.2 fL — ABNORMAL HIGH (ref 78.0–100.0)
Platelets: 254 10*3/uL (ref 150–400)
RBC: 2.9 MIL/uL — AB (ref 4.22–5.81)
RDW: 13.3 % (ref 11.5–15.5)
WBC: 8.2 10*3/uL (ref 4.0–10.5)

## 2017-12-10 LAB — PROTIME-INR
INR: 1.34
Prothrombin Time: 16.5 seconds — ABNORMAL HIGH (ref 11.4–15.2)

## 2017-12-10 LAB — LIPASE, BLOOD: Lipase: 57 U/L — ABNORMAL HIGH (ref 11–51)

## 2017-12-10 LAB — MAGNESIUM: Magnesium: 1.3 mg/dL — ABNORMAL LOW (ref 1.7–2.4)

## 2017-12-10 SURGERY — LAPAROSCOPIC CHOLECYSTECTOMY
Anesthesia: General

## 2017-12-10 MED ORDER — DEXTROSE 5 % IV SOLN
2.0000 g | Freq: Once | INTRAVENOUS | Status: AC
  Administered 2017-12-11: 2 g via INTRAVENOUS
  Filled 2017-12-10: qty 2

## 2017-12-10 MED ORDER — KCL IN DEXTROSE-NACL 20-5-0.45 MEQ/L-%-% IV SOLN
INTRAVENOUS | Status: DC
  Administered 2017-12-10 – 2017-12-12 (×4): via INTRAVENOUS
  Filled 2017-12-10 (×4): qty 1000

## 2017-12-10 MED ORDER — BOOST / RESOURCE BREEZE PO LIQD CUSTOM
1.0000 | Freq: Three times a day (TID) | ORAL | Status: DC
  Administered 2017-12-10 – 2017-12-12 (×3): 1 via ORAL

## 2017-12-10 MED ORDER — MAGNESIUM SULFATE 2 GM/50ML IV SOLN
2.0000 g | Freq: Once | INTRAVENOUS | Status: AC
  Administered 2017-12-10: 2 g via INTRAVENOUS
  Filled 2017-12-10: qty 50

## 2017-12-10 MED ORDER — HEPARIN (PORCINE) IN NACL 100-0.45 UNIT/ML-% IJ SOLN
1400.0000 [IU]/h | INTRAMUSCULAR | Status: DC
  Administered 2017-12-10: 1400 [IU]/h via INTRAVENOUS
  Filled 2017-12-10: qty 250

## 2017-12-10 MED ORDER — MAGNESIUM SULFATE 50 % IJ SOLN: 2.0000 g | Freq: Once | INTRAMUSCULAR | Status: DC

## 2017-12-10 NOTE — Progress Notes (Signed)
[facility] Report received from [place], Charity fund[place]N and [place], [place]. No significant changes. Patient stable, alert and oriented. NG tube in place and clamped. Reconnected to low intermittent suctioning. Will continue to monitor.

## 2017-12-10 NOTE — Consult Note (Signed)
[facility] Surgery Consult Note  [name] [facility] 1945/11/13  [id number].    Requesting MD: Rai Chief Complaint/Reason for Consult: Gallstone pancreatitis, colonic ileus HPI:  Patient is a 72 y/o male who was admitted to [facility] from home 1/22 with epigastric abdominal pain and emesis. Found to have choledocholithiasis with gallstone pancreatitis. Patient is s/p ERCP 1/24, cholecystectomy was planned for 1/25 but delayed due to AFib with HR in the 190s. Patient noted to be developing worsening abdominal distention and ileus, film on 1/28 showed markedly distended colon to 17 cm. CT scan done to rule out volvulus and was negative. Colonic decompression by Dr. Oneida Alar and patient restarted on abx due to concern for mild ischemic colitis. Patient transferred [facility] to [facility] 1/30.  Patient was seen by cardiology and initially cleared for surgery, on coumadin for Afib at home. On lovenox 90 mg SQ q12h.  Past abdominal surgeries: open inguinal hernia repair with mesh 08/2013 Dr. Ninfa Linden  Today patient states he has very mild abdominal pain intermittently. He denies nausea or vomiting. Passing flatus. Had a small liquid BM yesterday after colonic decompression. Still feels bloated but feels like it has improved slightly. Patient has not had anything to eat since 1/21.   ROS: Review of Systems  Constitutional: Negative for chills and fever.  Respiratory: Negative for shortness of breath and wheezing.   Cardiovascular: Positive for palpitations (AFib). Negative for chest pain.  Gastrointestinal: Positive for abdominal pain. Negative for blood in stool, melena, nausea and vomiting.  Genitourinary: Negative for dysuria, frequency and urgency.  Neurological: Positive for weakness.  All other systems reviewed and are negative.   Family History  Problem Relation Age of Onset  . Hypertension Father   . Colon cancer Neg Hx     Past Medical History:  Diagnosis Date  . Aortic stenosis    a. s/p  bioprosth AVR 04/2010;  b. 05/2012 Echo: EF 50-55%, nl bioprosth fxn w/ 83mHg gradient, mold dil LA.  .Marland KitchenAtrial fibrillation and flutter ([place]    a. s/p Cox-Maze @ time of AVR;  b. Previously on Amio and s/p DCCV x 2 ->failed->now rate controlled and on chronic coumadin.  .Marland KitchenCAD (coronary artery disease)    Occluded OM1 and LPDA in the setting of left atrial thrombus and cardioembolic event, otherwise nonobstructive disease 04/2010  . COPD (chronic obstructive pulmonary disease) (HO[place]   . Diverticulosis   . DJD (degenerative joint disease)    a. s/p R TKA 2010;  b. s/p L TKA 2013.  .Marland KitchenEssential hypertension   . Gout   . History of pneumonia   . Myocardial infarction ([place] 020/3559  Cardioembolic with occluded OM1 and left-sided PDA  . 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 [place]ricular fibrillation [place] [employer] [employer] 04/2010   VF arrest in setting of thrombotic MI 04/2010  . Wears glasses     Past Surgical History:  Procedure Laterality Date  . Aortic valve replacement with pericardial tissue valve, [place]  05/04/2010  . BALLOON DILATION N/A 12/04/2017   Procedure: CRE BALLOON DILATION;  Surgeon: RDaneil Dolin MD;  [facility];  Service: Gastroenterology;  Laterality: N/A;  . CARPAL TUNNEL RELEASE Right 10/18/2013   Procedure: RIGHT CARPAL TUNNEL RELEASE;  Surgeon: GWynonia Sours MD;  [place]  Service: Orthopedics;  Laterality: Right;  . CARPAL TUNNEL RELEASE Left 02/09/2014   Procedure: CARPAL TUNNEL RELEASE LEFT;  Surgeon: GWynonia Sours MD;  [place] [place]  [facility];  Service: Orthopedics;  Laterality: Left;  . CATARACT EXTRACTION W/PHACO Right 03/04/2016   Procedure: CATARACT EXTRACTION PHACO AND INTRAOCULAR LENS PLACEMENT (IOC);  Surgeon: Tonny Branch, MD;  Location: [facility];  Service: Ophthalmology;  Laterality: Right;  CDE:5.43  . COLONOSCOPY  2005   Dr. Gala Romney: internal hemorrhoids, pancolonic diverticula  . ERCP N/A 12/04/2017   Procedure:  ENDOSCOPIC RETROGRADE CHOLANGIOPANCREATOGRAPHY (ERCP) with stone extraction;  Surgeon: Daneil Dolin, MD;  [facility];  Service: Gastroenterology;  Laterality: N/A;  Sphincterotomy, removal of stones   . INGUINAL HERNIA REPAIR Right 08/25/2013   Procedure: HERNIA REPAIR INGUINAL ADULT;  Surgeon: Harl Bowie, MD;  Location: [place];  Service: General;  Laterality: Right;  . INSERTION OF MESH Right 08/25/2013   Procedure: INSERTION OF MESH;  Surgeon: Harl Bowie, MD;  Location: [place];  Service: General;  Laterality: Right;  . RETINAL LASER PROCEDURE     right eye   . Right ankle pinning     Fell from tree w/fracture several years ago.  . Right total knee replacement  04/02/2011  . SPHINCTEROTOMY N/A 12/04/2017   Procedure: SPHINCTEROTOMY;  Surgeon: Daneil Dolin, MD;  [facility];  Service: Gastroenterology;  Laterality: N/A;  . TOTAL KNEE ARTHROPLASTY  07/20/2012   Procedure: TOTAL KNEE ARTHROPLASTY;  Surgeon: Lorn Junes, MD;  Location: [place];  Service: Orthopedics;  Laterality: Left;  left total knee arthroplasty  . TRIGGER FINGER RELEASE Left 02/09/2014   Procedure: RELEASE A-1 PULLEY LEFT SMALL FINGER;  Surgeon: Wynonia Sours, MD;  Location: [place];  Service: Orthopedics;  Laterality: Left;  . ULNAR NERVE TRANSPOSITION Right 10/18/2013   Procedure: RIGHT ULNAR NERVE DECOMPRESSION/POSSIBLE TRANSPOSITION;  Surgeon: Wynonia Sours, MD;  Location: [place];  Service: Orthopedics;  Laterality: Right;  . ULNAR NERVE TRANSPOSITION Left 02/09/2014   Procedure: DECOMPRESSION LEFT ULNAR NERVE ;  Surgeon: Wynonia Sours, MD;  Location: [place];  Service: Orthopedics;  Laterality: Left;    Social History:  reports that he quit smoking about 7 years ago. His smoking use included cigars. he has never used smokeless tobacco. He reports that he drinks alcohol. He reports that he does not use drugs.  Allergies:   Allergies  Allergen Reactions  . Diclofenac Itching  . Naproxen     itching    Medications Prior to Admission  Medication Sig Dispense Refill  . acetaminophen (TYLENOL) 500 MG tablet Take 1,000 mg by mouth every 6 (six) hours as needed.     Marland Kitchen azithromycin (ZITHROMAX) 250 MG tablet Take 250 mg by mouth daily.    . benzonatate (TESSALON) 200 MG capsule Take 200 mg by mouth 2 (two) times daily as needed for cough.    Marland Kitchen lisinopril (PRINIVIL,ZESTRIL) 5 MG tablet TAKE ONE (1) TABLET EACH DAY 90 tablet 3  . metoprolol succinate (TOPROL-XL) 50 MG 24 hr tablet Take 1 tablet (50 mg total) by mouth 2 (two) times daily. Take with or immediately following a meal. 60 tablet 6  . warfarin (COUMADIN) 5 MG tablet Take 2 tablets daily except 1 tablet on Mondays and Fridays or as directed 60 tablet 4    Blood pressure 134/72, pulse 98, temperature 98.5 F (36.9 C), temperature source Oral, resp. rate 17, height 5' 10"  (1.778 m), weight 92.3 kg (203 lb 7.8 oz), SpO2 97 %. Physical Exam: Physical Exam  Constitutional: He is oriented to person, place, and time. He  appears well-developed and well-nourished. He is cooperative.  Non-toxic appearance. No distress.  HENT:  Head: Normocephalic and atraumatic.  Right Ear: External ear normal.  Left Ear: External ear normal.  Mouth/Throat: Oropharynx is clear and moist and mucous membranes are normal.  Nasogastric tube present  Eyes: Conjunctivae, EOM and lids are normal. No scleral icterus.  Pupils equal and round  Neck: Normal range of motion and phonation normal. Neck supple.  Cardiovascular: An irregularly irregular rhythm present.  Pulses:      Radial pulses are 2+ on the right side, and 2+ on the left side.       Dorsalis pedis pulses are 2+ on the right side, and 2+ on the left side.  No LE edema BL  Pulmonary/Chest: Effort normal and breath sounds normal.  Abdominal: Soft. He exhibits distension. He exhibits no shifting dullness. Bowel sounds are  decreased. There is no tenderness. There is no rigidity, no rebound and no guarding. No hernia.  Musculoskeletal:  ROM grossly intact in BL upper and lower extremities  Neurological: He is alert and oriented to person, place, and time. He has normal strength. No sensory deficit.  Skin: Skin is warm, dry and intact. He is not diaphoretic. No pallor.  Psychiatric: He has a normal mood and affect. His speech is normal and behavior is normal.    Results for orders placed or performed during the hospital encounter of 12/02/17 (from the past 48 hour(s))  Phosphorus     Status: None   Collection Time: 12/09/17  4:36 AM  Result Value Ref Range   Phosphorus 3.7 2.5 - 4.6 mg/dL  Magnesium     Status: Abnormal   Collection Time: 12/09/17  4:36 AM  Result Value Ref Range   Magnesium 1.6 (L) 1.7 - 2.4 mg/dL  Basic metabolic panel     Status: Abnormal   Collection Time: 12/09/17  4:36 AM  Result Value Ref Range   Sodium 144 135 - 145 mmol/L   Potassium 4.5 3.5 - 5.1 mmol/L   Chloride 111 101 - 111 mmol/L   CO2 23 22 - 32 mmol/L   Glucose, Bld 73 65 - 99 mg/dL   BUN 22 (H) 6 - 20 mg/dL   Creatinine, Ser 0.91 0.61 - 1.24 mg/dL   Calcium 9.2 8.9 - 10.3 mg/dL   GFR calc non Af Amer >60 >60 mL/min   GFR calc Af Amer >60 >60 mL/min    Comment: (NOTE) The eGFR has been calculated using the CKD EPI equation. This calculation has not been validated in all clinical situations. eGFR's persistently <60 mL/min signify possible Chronic Kidney Disease.    Anion gap 10 5 - 15   Ct Abdomen Pelvis W Contrast  Result Date: 12/08/2017 CLINICAL DATA:  Abdominal pain and distention. Evaluate for volvulus. EXAM: CT ABDOMEN AND PELVIS WITH CONTRAST TECHNIQUE: Multidetector CT imaging of the abdomen and pelvis was performed using the standard protocol following bolus administration of intravenous contrast. CONTRAST:  129m ISOVUE-300 IOPAMIDOL (ISOVUE-300) INJECTION 61% COMPARISON:  12/02/2017 FINDINGS: Lower  chest: Dependent changes are noted in both lung bases. Small right pleural effusion. Hepatobiliary: No suspicious liver abnormality. Tiny stones noted within the gallbladder. Pancreas: There is diffuse edema and inflammation involving the pancreas. Most severe at the head through body compatible acute pancreatitis. There is surrounding fat stranding identified. No pseudocyst identified. Spleen: Normal in size without focal abnormality. Adrenals/Urinary Tract: The adrenal glands are normal. The right kidney is normal. Tiny cyst arises from the  upper pole the left kidney. No hydronephrosis. Urinary bladder appears normal. Stomach/Bowel: Enteric tube is identified within the stomach. The small bowel loops have a normal course and caliber. There is a large mobile cecum which extends across the midline to the left upper quadrant of the abdomen. The ileocecal valve is identified within the left abdomen, image 37 of series 3. The distended cecum measures up to 10.4 cm and has air-contrast level. Air-filled loop of transverse colon is identified. Transition to normal to decreased caliber: Is identified at the level of the splenic flexure. Distal colonic diverticula noted without acute inflammation. Vascular/Lymphatic: Aortic atherosclerosis. No aneurysm. No upper abdominal or pelvic adenopathy. Reproductive: Prostate is unremarkable. Other: Trace ascites identified within the upper abdomen. No focal fluid collections. Musculoskeletal: There is degenerative disc disease identified throughout the lumbar spine. No suspicious bone lesions. IMPRESSION: 1. Changes of acute pancreatitis involving the head through body of pancreas noted. No evidence for pseudocyst formation. Tiny gallstones are identified within the gallbladder. No definite stones within the common bile duct. 2. Abnormal distension of the cecum through the transverse colon to the level of the splenic flexure. No evidence for cecal volvulus. Favor colonic ileus. 3.   Aortic Atherosclerosis (ICD10-I70.0). 4. Small right effusion. Dependent changes noted within both lung bases. Electronically Signed   By: Kerby Moors M.D.   On: 12/08/2017 12:46      Assessment/Plan AFib CAD/Hx of MI in 2011 Aortic stenosis - s/p valve replacement HTN COPD Diverticulosis Gout  Gallstone Pancreatitis - CT 1/28: changes of acute pancreatitis involving the head and the body of the pancreas, gallstones; colonic ileus - lipase 79 on 1/26 - Tbili 1.7 on 1/27, AST/ALT were normalized 1/27 Colonic Ileus - s/p decompression by GI - CT as above - repeat KUB - patient with 1 recorded stool yesterday - small, liquid; passing flatus - NGT with 350 cc/24h - continue on LIWS for now - mobilize as tolerated Hypomagnesemia - Mg 1.6 on 1/29, recheck labs  FEN: NPO, IVF; NGT to LIWS VTE: SCDs, lovenox 90 mg BID ID: IV flagyl 1/28>>  Repeat labs and obtain abdominal film today. Unsure why patient is anticoagulated on lovenox vs heparin, will discuss with primary service. If labs and ileus are improved, will discuss with MD possibility of cholecystectomy during this admission.    Brigid Re, [facility] 12/10/2017, 10:44 AM Pager: [phone] Consults: [phone] Mon-Fri 7:00 am-4:30 pm Sat-Sun 7:00 am-11:30 am

## 2017-12-10 NOTE — Progress Notes (Signed)
Initial Nutrition Assessment  DOCUMENTATION CODES:   Not applicable  INTERVENTION:   -Boost Breeze po TID, each supplement provides 250 kcal and 9 grams of protein -RD will follow for diet advancement and supplement as appropriate -If prolonged NPO/clear liquid diet status is anticipated, consider initiation of nutrition support  NUTRITION DIAGNOSIS:   Inadequate oral intake related to altered GI function as evidenced by (NPO/CLD x 8 days).  GOAL:   Patient will meet greater than or equal to 90% of their needs  MONITOR:   Diet advancement, Labs, Weight trends, Skin, I & O's  REASON FOR ASSESSMENT:   NPO/Clear Liquid Diet    ASSESSMENT:   [name] is a 72 y.o. male with medical history of chronic atrial fibrillation s/p Maze procedure and failed DCCV, coronary artery disease, aortic stenosis status post bioprosthetic AVR 2011, hyperlipidemia, hypertension presenting with intermittent abdominal pain and loose stools for 1 week.  1/24- s/p ERCP 1/26- NGT inserted (350 ml output x 24 hours) 1/29- [facility]  +1.1 L x 24 hours and +15.6 L since admission.   Pt with acute gallstone pancreatitis with colonic ileus. Plan for cholecystectomy after ileus improved.   Pt receiving nursing care at time of visit; unable to perform nutrition-focused physical exam or obtain additional nutrition hx at this time.   Reviewed wt hx; noted mild wt gain trend over the past several years.  Suspect distention may also be contributing to wt gain.   Labs reviewed: Lipase: 57.   Diet Order:  Diet clear liquid Room service appropriate? Yes; Fluid consistency: Thin Diet NPO time specified  EDUCATION NEEDS:   No education needs have been identified at this time  Skin:  Skin Assessment: Reviewed RN Assessment  Last BM:  12/01/17  Height:   Ht Readings from Last 1 Encounters:  12/09/17 5\' 10"  (1.778 m)    Weight:   Wt Readings from Last 1 Encounters:  12/09/17 203 lb  7.8 oz (92.3 kg)    Ideal Body Weight:  75.5 kg  BMI:  Body mass index is 29.2 kg/m.  Estimated Nutritional Needs:   Kcal:  1800-2000  Protein:  90-105 grams  Fluid:  1.8-2.0 L    Fiona Coto A. Mayford KnifeWilliams, RD, LDN, CDE Pager: [phone] After hours Pager: [phone]

## 2017-12-10 NOTE — Care Management Important Message (Signed)
Important Message  Patient Details  Name: [name] MRN: [id number] Date of Birth: 1946-03-22   Medicare Important Message Given:  Yes    Sharma Lawrance Stefan ChurchBratton 12/10/2017, 1:07 PM

## 2017-12-10 NOTE — Progress Notes (Signed)
[facility]                                                                              Patient Demographics  [name], is a 72 y.o. male, DOB - 1946/02/27, ZOX:[id number]  Admit date - 12/02/2017   Admitting Physician Catarina Hartshorn, MD  Outpatient Primary MD for the patient is Benita Stabile, MD  Outpatient specialists:   LOS - 8  days   Medical records reviewed and are as summarized below:    Chief Complaint  Patient presents with  . Abdominal Pain       Brief summary   Patient is a 73 year old male with history of chronic atrial fibrillation status post maze, failed DC CV, CAD, aortic stenosis status post bioprosthetic AVR, hyperlipidemia, hypertension presented with abdominal pain, nausea and vomiting on 1/22.  He was found to have lipase over 3000 with elevated LFTs. CT of the abdomen and pelvis showed cholelithiasis with gallbladder wall thickening with intra and extrahepatic ductal dilatation as well as common bile duct dilatation.    Patient was admitted for further workup. Patient was seen by GI, surgery and cardiology (for preop clearance).  Patient's procedures were initially delayed due to iatrogenic coagulopathy (Coumadin for A. Fib).  Underwent ERCP on 1/24 however during ERCP, had A. fib with RVR with heart rate into 190s.  Cholecystectomy was canceled.  On 1/28, patient had marked distention of his colon up to 17 cm on x-ray with concern for possible volvulus.  CT abdomen was negative, patient underwent colonoscopic decompression by Dr. Darrick Penna.  Antibiotics were restarted due to concern for mild ischemic colitis.  Family requested transfer to [facility].   Assessment & Plan    Principal Problem:   Gallstone pancreatitis, choledocholithiasis -CT of the abdomen at the time of admission showed cholelithiasis with gallbladder wall thickening, intra-and extrahepatic ductal dilatation, CBD dilatation, lipase> 3000 -ERCP 1/24 showed choledocholithiasis  status post successful endoscopic sphincterotomy, balloon dilatation and stone extraction. -Plan for cholecystectomy after distention/ileus improves, general surgery consulted -For now placed on IV heparin (continue to hold Coumadin) until decision by surgery   Active Problems:  Colonic ileus -Patient underwent colonoscopic decompression on 1/28 at [facility] -Per patient and wife, abdominal distention has significantly improved since then - continue IV-fluids, NGT to low intermittent wall suction -Continue IV Flagyl  Hypomagnesemia -Magnesium 1.3, will replace IV    Atrial fibrillation, chronic (HCC)  -Currently rate controlled, continue IV Lopressor 5 mg every 6 hours -Place on telemetry  -Continue IV heparin drip until surgical decision    Code Status: Full code DVT Prophylaxis: IV heparin Family Communication: Discussed in detail with the patient, all imaging results, lab results explained to the patient and wife at the bedside   Disposition Plan:  Time Spent in minutes   35 minutes  Procedures:    Consultants:   General surgery Patient followed by gastroenterology, surgery, cardiology at [facility]  Antimicrobials:   IV Flagyl   Medications  Scheduled Meds: . bisacodyl  5 mg Oral QPM  . metoprolol tartrate  5 mg Intravenous Q6H  . pantoprazole (PROTONIX) IV  40 mg  Intravenous QHS   Continuous Infusions: . dextrose 5 % and 0.45 % NaCl with KCl 20 mEq/L 100 mL/hr at 12/10/17 1346  . heparin    . magnesium sulfate 1 - 4 g bolus IVPB 2 g (12/10/17 1349)  . metronidazole Stopped (12/10/17 1055)   PRN Meds:.acetaminophen **OR** acetaminophen, promethazine   Antibiotics   Anti-infectives (From admission, onward)   Start     Dose/Rate Route Frequency Ordered Stop   12/08/17 1800  metroNIDAZOLE (FLAGYL) IVPB 500 mg     500 mg 100 mL/hr over 60 Minutes Intravenous Every 8 hours 12/08/17 1633     12/08/17 0600  cefoTEtan (CEFOTAN) 2 g in dextrose 5 % 50 mL IVPB   Status:  Discontinued     2 g 100 mL/hr over 30 Minutes Intravenous On call to O.R. 12/08/17 0009 12/08/17 1516   12/05/17 0600  ceFAZolin (ANCEF) IVPB 2g/100 mL premix     2 g 200 mL/hr over 30 Minutes Intravenous On call to O.R. 12/04/17 1455 12/06/17 0559   12/02/17 1000  piperacillin-tazobactam (ZOSYN) IVPB 3.375 g  Status:  Discontinued     3.375 g 12.5 mL/hr over 240 Minutes Intravenous Every 8 hours 12/02/17 0947 12/08/17 1440   12/02/17 0945  piperacillin-tazobactam (ZOSYN) IVPB 3.375 g  Status:  Discontinued     3.375 g 100 mL/hr over 30 Minutes Intravenous Every 8 hours 12/02/17 0941 12/02/17 0946        Subjective:   [name] was seen and examined today.  Feels abdominal distention is improving, less distended from yesterday.  Still has NGT.  No nausea or vomiting.  Passing flatus.  Patient denies dizziness, chest pain, shortness of breath, new weakness, numbess, tingling.  Anxious about the plan and surgery.  Objective:   Vitals:   12/09/17 1600 12/09/17 2149 12/09/17 2359 12/10/17 0601  BP: 130/78 115/74 139/79 134/72  Pulse: 92 92 99 98  Resp: 18  17 17   Temp: 98.1 F (36.7 C)  98.4 F (36.9 C) 98.5 F (36.9 C)  TempSrc:   Oral Oral  SpO2: 99% 99% 97% 97%  Weight:   92.3 kg (203 lb 7.8 oz)   Height:   5\' 10"  (1.778 m)     Intake/Output Summary (Last 24 hours) at 12/10/2017 1403 Last data filed at 12/10/2017 1055 Gross per 24 hour  Intake 2236.25 ml  Output 1330 ml  Net 906.25 ml     Wt Readings from Last 3 Encounters:  12/09/17 92.3 kg (203 lb 7.8 oz)  07/24/17 87.5 kg (193 lb)  07/29/16 85.3 kg (188 lb)     Exam  General: Alert and oriented x 3, NAD  Eyes:   HEENT:  Atraumatic, normocephalic  Cardiovascular: S1 S2 clear, irregularly irregular  Respiratory: Clear to auscultation bilaterally, no wheezing, rales or rhonchi  Gastrointestinal: Soft, nontender, +distended, hypoactive  bowel sounds  Ext: no pedal edema bilaterally  Neuro:  AAOx3, Cr N's II- XII. Strength 5/5 upper and lower extremities bilaterally  Musculoskeletal: No digital cyanosis, clubbing  Skin: No rashes  Psych: Normal affect and demeanor, alert and oriented x3    Data Reviewed:  I have personally reviewed following labs and imaging studies  Micro Results Recent Results (from the past 240 hour(s))  Surgical pcr screen     Status: None   Collection Time: 12/06/17 10:01 AM  Result Value Ref Range Status   MRSA, PCR NEGATIVE NEGATIVE Final   Staphylococcus aureus NEGATIVE NEGATIVE Final    Comment: (  NOTE) The Xpert SA Assay (FDA approved for NASAL specimens in patients 75 years of age and older), is one component of a comprehensive surveillance program. It is not intended to diagnose infection nor to guide or monitor treatment.     Radiology Reports Dg Chest 1 View  Result Date: 12/06/2017 CLINICAL DATA:  Nasogastric tube placement EXAM: CHEST 1 VIEW COMPARISON:  4 days prior FINDINGS: Nasogastric tube tip reaches the stomach with side port at the GE junction. Low lung volumes with bandlike opacity at the left base. Cardiomegaly. History of aortic valve replacement. No Kerley lines, effusion, or pneumothorax. IMPRESSION: 1. Nasogastric tube with tip at the proximal stomach. 2. Atelectasis at the left base. Electronically Signed   By: Marnee Spring M.D.   On: 12/06/2017 15:03   Dg Abd 1 View  Result Date: 12/08/2017 CLINICAL DATA:  Ileus. EXAM: ABDOMEN - 1 VIEW COMPARISON:  Radiograph of December 07, 2017. FINDINGS: Nasogastric tube tip is seen in expected position of distal stomach. No small bowel dilatation is noted. Distention of colon is again noted measuring 18 cm, which is not significantly changed compared to prior exam. IMPRESSION: Grossly stable colonic dilatation as described above. Electronically Signed   By: Lupita Raider, M.D.   On: 12/08/2017 10:24   Dg Abd 1 View  Result Date: 12/07/2017 CLINICAL DATA:  Patient with history of  small bowel obstruction. Abdominal tightness. EXAM: ABDOMEN - 1 VIEW COMPARISON:  Abdominal radiograph 12/06/2017. FINDINGS: Gas is demonstrated within the small bowel throughout the abdomen. Enteric tube tip and side-port project over the stomach. Interval increase in gaseous distention of the colon measuring up to 17 cm. Supine evaluation limited for the detection of free intraperitoneal air. Lumbar spine degenerative changes. IMPRESSION: Marked gaseous distention of the colon measuring up to 17 cm, increased from prior. Electronically Signed   By: Annia Belt M.D.   On: 12/07/2017 14:48   Ct Abdomen Pelvis W Contrast  Result Date: 12/08/2017 CLINICAL DATA:  Abdominal pain and distention. Evaluate for volvulus. EXAM: CT ABDOMEN AND PELVIS WITH CONTRAST TECHNIQUE: Multidetector CT imaging of the abdomen and pelvis was performed using the standard protocol following bolus administration of intravenous contrast. CONTRAST:  ISOVUE-300 IOPAMIDOL (ISOVUE-300) INJECTION 61% COMPARISON:  12/02/2017 FINDINGS: Lower chest: Dependent changes are noted in both lung bases. Small right pleural effusion. Hepatobiliary: No suspicious liver abnormality. Tiny stones noted within the gallbladder. Pancreas: There is diffuse edema and inflammation involving the pancreas. Most severe at the head through body compatible acute pancreatitis. There is surrounding fat stranding identified. No pseudocyst identified. Spleen: Normal in size without focal abnormality. Adrenals/Urinary Tract: The adrenal glands are normal. The right kidney is normal. Tiny cyst arises from the upper pole the left kidney. No hydronephrosis. Urinary bladder appears normal. Stomach/Bowel: Enteric tube is identified within the stomach. The small bowel loops have a normal course and caliber. There is a large mobile cecum which extends across the midline to the left upper quadrant of the abdomen. The ileocecal valve is identified within the left abdomen,  image 37 of series 3. The distended cecum measures up to 10.4 cm and has air-contrast level. Air-filled loop of transverse colon is identified. Transition to normal to decreased caliber: Is identified at the level of the splenic flexure. Distal colonic diverticula noted without acute inflammation. Vascular/Lymphatic: Aortic atherosclerosis. No aneurysm. No upper abdominal or pelvic adenopathy. Reproductive: Prostate is unremarkable. Other: Trace ascites identified within the upper abdomen. No focal fluid collections. Musculoskeletal: There  is degenerative disc disease identified throughout the lumbar spine. No suspicious bone lesions. IMPRESSION: 1. Changes of acute pancreatitis involving the head through body of pancreas noted. No evidence for pseudocyst formation. Tiny gallstones are identified within the gallbladder. No definite stones within the common bile duct. 2. Abnormal distension of the cecum through the transverse colon to the level of the splenic flexure. No evidence for cecal volvulus. Favor colonic ileus. 3.  Aortic Atherosclerosis (ICD10-I70.0). 4. Small right effusion. Dependent changes noted within both lung bases. Electronically Signed   By: Signa Kell M.D.   On: 12/08/2017 12:46   Ct Abdomen Pelvis W Contrast  Result Date: 12/02/2017 CLINICAL DATA:  Altered bowel habits. Treatment for diarrhea has resulted in no bowel movement for 2 days. Increasing abdominal pain. EXAM: CT ABDOMEN AND PELVIS WITH CONTRAST TECHNIQUE: Multidetector CT imaging of the abdomen and pelvis was performed using the standard protocol following bolus administration of intravenous contrast. CONTRAST:  ISOVUE-300 IOPAMIDOL (ISOVUE-300) INJECTION 61% COMPARISON:  Multiple exams, including CT scan from 07/23/2012 FINDINGS: Lower chest: Moderate cardiomegaly. Aortic valve prosthesis. Posterior wall calcifications in the left atrium and to a lesser extent in the right atrium. Thinning of the left ventricular  myocardium. Mild atelectasis in the lingula and mild dependent atelectasis in both lower lobes. Circumflex coronary artery atherosclerotic calcification. Hepatobiliary: Mild intrahepatic biliary dilatation. Common hepatic duct 1.6 cm, formerly 0.8 cm on 07/23/2012. Common bile duct 1.2 cm, formerly 0.8 cm. This CBD dilatation extends to the ampulla Dependent densities in the gallbladder are compatible with gallstones. Borderline wall thickening in portions of the gallbladder. Pancreas: Abnormal peripancreatic stranding diffusely without pseudocyst, abscess, or necrosis. Spleen: Unremarkable Adrenals/Urinary Tract: Stable fullness of the right adrenal gland. Left adrenal gland unremarkable. 0.7 by 1.4 cm hypodense lesion of the left kidney upper pole is likely a small cyst but technically too small to characterize. Stomach/Bowel: Mild wall thickening and accentuated enhancement in the duodenal bulb, descending duodenum, and proximal transverse duodenum noted, secondary inflammation is favored over peptic ulcer disease. Several small diverticula of the transverse duodenum do not appear to be the epicenter of the regional inflammatory process. Descending and sigmoid colon diverticulosis without active diverticulitis. Scattered diverticula of the ascending colon noted. Appendix normal. Vascular/Lymphatic: Aortoiliac atherosclerotic vascular disease. Reproductive: Unremarkable Other: Small amount of free fluid adjacent to the descending duodenum and tracking in the right paracolic gutter. Small amount of central mesenteric edema. Musculoskeletal: Old healed right lower lateral rib fractures. Subacute healing fracture of the left anterior fifth and sixth ribs. Prior median sternotomy. Mildly exaggerated lumbar lordosis. Grade 1 degenerative anterolisthesis at L5-S1 with slightly transitional S1 level. Lumbar spondylosis and degenerative disc disease with mild impingement at L4-5 and L5-S1. IMPRESSION: 1. Acute  pancreatitis with peripancreatic stranding and a small amount of ascites tracking in the right paracolic gutter. No findings of pancreatic abscess, necrosis, or pseudocyst. 2. Gallstones are present in the gallbladder and there is new intrahepatic and extrahepatic biliary dilatation, raising the strong possibility of occult distal CBD stone as a potential cause for the pancreatitis and biliary dilatation. Minimal gallbladder wall thickening. 3. Subacute healing fractures of the left anterior fifth and sixth ribs. 4. Cardiomegaly with wall calcifications along the atria and thinning of the left ventricular myocardium. Coronary atherosclerosis. Scattered colonic diverticulosis diverticula of the transverse duodenum. There are also some. 5.  Aortic Atherosclerosis (ICD10-I70.0). 6. Mild lumbar impingement at L4-5 and L5-S1. Electronically Signed   By: Gaylyn Rong M.D.   On: 12/02/2017  07:48   Mr 3d Recon At Scanner  Result Date: 12/02/2017 CLINICAL DATA:  Upper abdominal pain. Abnormal liver function tests. Dilated common bile duct on ultrasound. Acute pancreatitis on CT scan. EXAM: MRI ABDOMEN WITHOUT AND WITH CONTRAST (INCLUDING MRCP) TECHNIQUE: Multiplanar multisequence MR imaging of the abdomen was performed both before and after the administration of intravenous contrast. Heavily T2-weighted images of the biliary and pancreatic ducts were obtained, and three-dimensional MRCP images were rendered by post processing. CONTRAST:  18mL MULTIHANCE GADOBENATE DIMEGLUMINE 529 MG/ML IV SOLN COMPARISON:  12/02/2017 CT scan FINDINGS: Despite efforts by the technologist and patient, motion artifact is present on today's exam and could not be eliminated. This is a common result when MRCP is attempted in the inpatient setting where patients are less likely to be able to breath hold and cooperate in controlling motion. This reduces exam sensitivity and specificity. Lower chest: Stable cardiomegaly. Aortic valve  prosthesis. Mild atelectasis in the posterior basal segments of both lower lobes. Hepatobiliary: Cholelithiasis is observed. On images 45 through 50 of series 5, there is low signal intensity filling defect in the distal CBD compatible with small stones in the setting of choledocholithiasis. This is a likely cause for the biliary dilatation and pancreatitis. There appear to be multiple clustered calculi in the 5 mm in less range in the vicinity of the ampulla. The filling defects are also shown on image 23/4 and images 29 through 31 of series 24. Mild extrahepatic biliary dilatation, with the common bile duct at about 9 mm. Pancreas: Peripancreatic edema compatible with acute pancreatitis. No abscess, pseudocyst, or findings of pancreatic necrosis. Spleen:  Unremarkable Adrenals/Urinary Tract:  Unremarkable Stomach/Bowel: Mild secondary inflammation of the duodenum. Vascular/Lymphatic:  Aortoiliac atherosclerotic vascular disease. Other: Small amount of ascites along the inferior right hepatic margin. Musculoskeletal: Unremarkable IMPRESSION: 1. Choledocholithiasis with several clustered small (less than 5 mm) stones in the distal CBD. These at the likely cause of the mild biliary dilatation and acute pancreatitis. 2. Peripancreatic edema compatible with acute pancreatitis. No findings of pancreatic abscess, pseudocyst, or pancreatic necrosis. 3. Stable cardiomegaly with aortic valve prosthesis. 4. Mild subsegmental atelectasis in the posterior basal segments of both lower lobes. 5. Gallstones in the gallbladder. 6.  Aortic Atherosclerosis (ICD10-I70.0). Electronically Signed   By: Gaylyn Rong M.D.   On: 12/02/2017 15:13   Dg Chest Port 1 View  Result Date: 12/07/2017 CLINICAL DATA:  Status post NG tube placement. EXAM: PORTABLE CHEST 1 VIEW COMPARISON:  Chest radiograph 12/06/2017. FINDINGS: Enteric tube tip and side-port project over the stomach. Stable cardiac and mediastinal contours status post  median sternotomy. Focal consolidation left lower hemithorax. No pleural effusion or pneumothorax. Gaseous distended loops of bowel within the upper abdomen. IMPRESSION: Enteric tube tip and side-port project over the stomach. Gaseous distended loops of bowel within the upper abdomen. Consider dedicated abdominal radiography. Focal opacity left lower hemithorax may represent atelectasis. Infection not excluded. Electronically Signed   By: Annia Belt M.D.   On: 12/07/2017 21:32   Dg Ercp Biliary & Pancreatic Ducts  Result Date: 12/04/2017 CLINICAL DATA:  Choledocholithiasis EXAM: ERCP WITH SPHINCTEROTOMY TECHNIQUE: Multiple spot images obtained with the fluoroscopic device and submitted for interpretation post-procedure. FLUOROSCOPY TIME:  Fluoroscopy Time:  2 minutes 51 seconds Radiation Exposure Index (if provided by the fluoroscopic device): Not provided Number of Acquired Spot Images: multiple fluoroscopic screen captures COMPARISON:  MRCP 12/02/2017 FINDINGS: Images demonstrate upper normal diameter CBD with smooth walls. A few small filling defects are  seen at the distal CBD compatible with choledocholithiasis. Multiple small air bubbles are seen later in the exam within the distal CBD. Passage of a balloon catheter was made with clearance of filling defects. Endoscopic visualization confirmed removal of stones with balloon catheter passage. Last image demonstrates adequate drainage of contrast from the the CBD IMPRESSION: Choledocholithiasis. These images were submitted for radiologic interpretation only. Please see the procedural report for the amount of contrast and the fluoroscopy time utilized. Electronically Signed   By: Ulyses Southward M.D.   On: 12/04/2017 14:19   Dg Abdomen Acute W/chest  Result Date: 12/02/2017 CLINICAL DATA:  Abdominal pain since this morning. EXAM: DG ABDOMEN ACUTE W/ 1V CHEST COMPARISON:  Chest 02/05/2017 FINDINGS: Postoperative changes in the mediastinum. Shallow inspiration  with atelectasis in the lung bases. Cardiac enlargement without vascular congestion or edema. No focal consolidation. No pneumothorax. Calcification of the aorta. Severe degenerative changes in the shoulders with resection or resorption of the distal left clavicle. Scattered gas and stool throughout the colon. No small or large bowel distention. No free intra-abdominal air. No abnormal air-fluid levels. Degenerative changes in the spine and hips. IMPRESSION: Shallow inspiration with atelectasis in the lung bases. Cardiac enlargement. Aortic atherosclerosis. Nonobstructive bowel gas pattern with stool-filled colon. Electronically Signed   By: Burman Nieves M.D.   On: 12/02/2017 06:17   Dg Abd Portable 1v  Result Date: 12/06/2017 CLINICAL DATA:  Nausea, abdominal distension, recent ERCP and sphincterotomy with extraction of CBD stones EXAM: PORTABLE ABDOMEN - 1 VIEW COMPARISON:  Portable exam 1042 hours compared to 12/02/2017 FINDINGS: Air-filled small bowel loops throughout abdomen. Gaseous distention of transverse colon up to 13.2 cm diameter. No definite bowel wall thickening. Bones demineralized with degenerative disc and facet disease changes of the lumbar spine. IMPRESSION: Gaseous distention of colon up to 13.2 cm diameter. Additional air-filled upper normal caliber small bowel loops. Electronically Signed   By: Ulyses Southward M.D.   On: 12/06/2017 12:19   Mr Abdomen Mrcp Vivien Rossetti Contast  Result Date: 12/02/2017 CLINICAL DATA:  Upper abdominal pain. Abnormal liver function tests. Dilated common bile duct on ultrasound. Acute pancreatitis on CT scan. EXAM: MRI ABDOMEN WITHOUT AND WITH CONTRAST (INCLUDING MRCP) TECHNIQUE: Multiplanar multisequence MR imaging of the abdomen was performed both before and after the administration of intravenous contrast. Heavily T2-weighted images of the biliary and pancreatic ducts were obtained, and three-dimensional MRCP images were rendered by post processing. CONTRAST:   18mL MULTIHANCE GADOBENATE DIMEGLUMINE 529 MG/ML IV SOLN COMPARISON:  12/02/2017 CT scan FINDINGS: Despite efforts by the technologist and patient, motion artifact is present on today's exam and could not be eliminated. This is a common result when MRCP is attempted in the inpatient setting where patients are less likely to be able to breath hold and cooperate in controlling motion. This reduces exam sensitivity and specificity. Lower chest: Stable cardiomegaly. Aortic valve prosthesis. Mild atelectasis in the posterior basal segments of both lower lobes. Hepatobiliary: Cholelithiasis is observed. On images 45 through 50 of series 5, there is low signal intensity filling defect in the distal CBD compatible with small stones in the setting of choledocholithiasis. This is a likely cause for the biliary dilatation and pancreatitis. There appear to be multiple clustered calculi in the 5 mm in less range in the vicinity of the ampulla. The filling defects are also shown on image 23/4 and images 29 through 31 of series 24. Mild extrahepatic biliary dilatation, with the common bile duct at about 9  mm. Pancreas: Peripancreatic edema compatible with acute pancreatitis. No abscess, pseudocyst, or findings of pancreatic necrosis. Spleen:  Unremarkable Adrenals/Urinary Tract:  Unremarkable Stomach/Bowel: Mild secondary inflammation of the duodenum. Vascular/Lymphatic:  Aortoiliac atherosclerotic vascular disease. Other: Small amount of ascites along the inferior right hepatic margin. Musculoskeletal: Unremarkable IMPRESSION: 1. Choledocholithiasis with several clustered small (less than 5 mm) stones in the distal CBD. These at the likely cause of the mild biliary dilatation and acute pancreatitis. 2. Peripancreatic edema compatible with acute pancreatitis. No findings of pancreatic abscess, pseudocyst, or pancreatic necrosis. 3. Stable cardiomegaly with aortic valve prosthesis. 4. Mild subsegmental atelectasis in the posterior  basal segments of both lower lobes. 5. Gallstones in the gallbladder. 6.  Aortic Atherosclerosis (ICD10-I70.0). Electronically Signed   By: Walter  LiebkemaGaylyn Rongnn M.D.   On: 12/02/2017 15:13   [place] Abdomen Limited Ruq  Result Date: 12/02/2017 CLINICAL DATA:  Acute cholelithiasis, pancreatitis. EXAM: ULTRASOUND ABDOMEN LIMITED RIGHT UPPER QUADRANT COMPARISON:  CT of the abdomen and pelvis from the same day. FINDINGS: Gallbladder: Sludge is present within the gallbladder. No visible stones are present. Wall is thickened, measuring up to 7 mm. There is no sonographic Murphy sign. Common bile duct: Diameter: The common bile duct is dilated measuring up to 11.6 mm. Liver: Liver is diffusely echogenic. No discrete lesions are present. Portal vein is patent on color Doppler imaging with normal direction of blood flow towards the liver. IMPRESSION: 1. Dilated common bile duct likely associated with the acute pancreatitis. 2. Gallbladder wall thickening and sludge may also be associated with acute pancreatitis. 3. No gallstones or sonographic Murphy sign to suggest primary gallbladder pathology. Electronically Signed   By: Marin Robertshristopher  Mattern M.D.   On: 12/02/2017 11:27    Lab Data:  CBC: Recent Labs  Lab 12/04/17 0536 12/05/17 0617 12/06/17 0730 12/07/17 0551 12/08/17 0534 12/10/17 1148  WBC 6.0 8.9 11.8* 10.0 8.4 8.2  NEUTROABS 5.0  --   --   --   --   --   HGB 10.7* 10.4* 10.8* 10.3* 10.5* 10.0*  HCT 33.2* 32.5* 34.0* 32.0* 33.2* 30.8*  MCV 106.4* 106.6* 107.9* 107.7* 108.5* 106.2*  PLT 145* 190 238 226 250 254   Basic Metabolic Panel: Recent Labs  Lab 12/04/17 0536 12/05/17 0617 12/07/17 0551 12/07/17 1341 12/08/17 0534 12/09/17 0436 12/10/17 1148  NA 137 134* 142  --  142 144 143  K 4.2 4.4 4.6  --  4.5 4.5 4.3  CL 107 104 109  --  108 111 110  CO2 20* 20* 21*  --  22 23 20*  GLUCOSE 105* 136* 84  --  88 73 56*  BUN 23* 26* 23*  --  23* 22* 17  CREATININE 1.06 1.12 0.97  --  0.96 0.91  1.10  CALCIUM 8.0* 8.0* 8.8*  --  8.9 9.2 8.9  MG 1.6*  --   --  1.9 2.0 1.6* 1.3*  PHOS  --   --   --  2.5 3.0 3.7  --    GFR: Estimated Creatinine Clearance: 69.3 mL/min (by C-G formula based on SCr of 1.1 mg/dL). Liver Function Tests: Recent Labs  Lab 12/04/17 0536 12/05/17 0617 12/06/17 0730 12/07/17 0551 12/10/17 1148  AST 110* 96*  95* 44* 26 21  ALT 66* 78*  76* 61 41 23  ALKPHOS 147* 159*  158* 141* 113 98  BILITOT 4.3* 2.0*  2.1* 1.7* 1.7* 1.6*  PROT 6.9 6.9  6.8 7.2 6.6 5.9*  ALBUMIN 2.9* 2.9*  2.9* 2.9* 2.6* 2.3*   Recent Labs  Lab 12/04/17 0536 12/06/17 0730 12/10/17 1148  LIPASE 329* 79* 57*   No results for input(s): AMMONIA in the last 168 hours. Coagulation Profile: Recent Labs  Lab 12/04/17 0536 12/10/17 1148  INR 1.30 1.34   Cardiac Enzymes: No results for input(s): CKTOTAL, CKMB, CKMBINDEX, TROPONINI in the last 168 hours. BNP (last 3 results) No results for input(s): PROBNP in the last 8760 hours. HbA1C: No results for input(s): HGBA1C in the last 72 hours. CBG: No results for input(s): GLUCAP in the last 168 hours. Lipid Profile: No results for input(s): CHOL, HDL, LDLCALC, TRIG, CHOLHDL, LDLDIRECT in the last 72 hours. Thyroid Function Tests: No results for input(s): TSH, T4TOTAL, FREET4, T3FREE, THYROIDAB in the last 72 hours. Anemia Panel: No results for input(s): VITAMINB12, FOLATE, FERRITIN, TIBC, IRON, RETICCTPCT in the last 72 hours. Urine analysis:    Component Value Date/Time   COLORURINE YELLOW 12/02/2017 2335   APPEARANCEUR CLEAR 12/02/2017 2335   LABSPEC 1.036 (H) 12/02/2017 2335   PHURINE 5.0 12/02/2017 2335   GLUCOSEU NEGATIVE 12/02/2017 2335   HGBUR SMALL (A) 12/02/2017 2335   BILIRUBINUR NEGATIVE 12/02/2017 2335   KETONESUR NEGATIVE 12/02/2017 2335   PROTEINUR NEGATIVE 12/02/2017 2335   UROBILINOGEN 1.0 07/14/2012 1243   NITRITE NEGATIVE 12/02/2017 2335   LEUKOCYTESUR NEGATIVE 12/02/2017 2335     Brigitte Soderberg M.D. [facility] 12/10/2017, 2:03 PM  Pager: [phone] Between 7am to 7pm - call Pager - [phone]  After 7pm go to [email or web address] - password [email or web address]  Call night coverage person covering after 7pm

## 2017-12-10 NOTE — Progress Notes (Signed)
Unable to place patient on telemetry. No tele boxes available on floor. Called [facility] to request a box but they were out also.

## 2017-12-10 NOTE — Progress Notes (Signed)
ANTICOAGULATION CONSULT NOTE  Pharmacy Consult for heparin Indication: atrial fibrillation  Heparin Dosing Weight: 91.6 kg  Labs: Recent Labs    12/08/17 0534 12/09/17 0436 12/10/17 1148  HGB 10.5*  --  10.0*  HCT 33.2*  --  30.8*  PLT 250  --  254  LABPROT  --   --  16.5*  INR  --   --  1.34  CREATININE 0.96 0.91 1.10    Assessment: 71 yom on warfarin PTA for afib, transitioned to Lovenox prior to scheduled cholecystectomy. Surgery is now on hold. Pharmacy consulted to transition to IV heparin. Last dose of Lovenox 90mg  SQ q12h given at 1729 on 1/29. Last INR 1.3 on 1/24. Hg low stable, plt wnl. No bleed issues documented.  Goal of Therapy:  Heparin level 0.3-0.7 units/ml Monitor platelets by anticoagulation protocol: Yes   Plan:  D/c Lovenox Start heparin at 1400 units/h (no bolus) 6h heparin level Daily heparin level/CBC Monitor s/sx bleeding F/u Surgery plans  Babs BertinHaley Alexes Menchaca, PharmD, BCPS Clinical Pharmacist 12/10/2017 1:25 PM

## 2017-12-11 ENCOUNTER — Encounter (HOSPITAL_COMMUNITY): Payer: Self-pay | Admitting: Certified Registered"

## 2017-12-11 ENCOUNTER — Inpatient Hospital Stay (HOSPITAL_COMMUNITY): Payer: Medicare Other | Admitting: Certified Registered"

## 2017-12-11 ENCOUNTER — Inpatient Hospital Stay (HOSPITAL_COMMUNITY): Payer: Medicare Other

## 2017-12-11 ENCOUNTER — Encounter (HOSPITAL_COMMUNITY)
Admission: EM | Disposition: A | Discharge status: Home / Self Care | Payer: Self-pay | Source: Home / Self Care | Attending: Internal Medicine

## 2017-12-11 HISTORY — PX: CHOLECYSTECTOMY: SHX55

## 2017-12-11 LAB — PROTIME-INR
INR: 1.42
PROTHROMBIN TIME: 17.3 s — AB (ref 11.4–15.2)

## 2017-12-11 LAB — PHOSPHORUS: PHOSPHORUS: 2.6 mg/dL (ref 2.5–4.6)

## 2017-12-11 LAB — CBC
HCT: 32.2 % — ABNORMAL LOW (ref 39.0–52.0)
Hemoglobin: 10.6 g/dL — ABNORMAL LOW (ref 13.0–17.0)
MCH: 34.1 pg — ABNORMAL HIGH (ref 26.0–34.0)
MCHC: 32.9 g/dL (ref 30.0–36.0)
MCV: 103.5 fL — ABNORMAL HIGH (ref 78.0–100.0)
PLATELETS: 266 10*3/uL (ref 150–400)
RBC: 3.11 MIL/uL — AB (ref 4.22–5.81)
RDW: 13.2 % (ref 11.5–15.5)
WBC: 8.5 10*3/uL (ref 4.0–10.5)

## 2017-12-11 LAB — COMPREHENSIVE METABOLIC PANEL
ALBUMIN: 2.2 g/dL — AB (ref 3.5–5.0)
ALK PHOS: 89 U/L (ref 38–126)
ALT: 21 U/L (ref 17–63)
ANION GAP: 8 (ref 5–15)
AST: 20 U/L (ref 15–41)
BUN: 10 mg/dL (ref 6–20)
CALCIUM: 8.7 mg/dL — AB (ref 8.9–10.3)
CHLORIDE: 106 mmol/L (ref 101–111)
CO2: 24 mmol/L (ref 22–32)
Creatinine, Ser: 0.8 mg/dL (ref 0.61–1.24)
GFR calc non Af Amer: >60 mL/min (ref >60)
Glucose, Bld: 166 mg/dL — ABNORMAL HIGH (ref 65–99)
POTASSIUM: 3.8 mmol/L (ref 3.5–5.1)
SODIUM: 138 mmol/L (ref 135–145)
Total Bilirubin: 1.4 mg/dL — ABNORMAL HIGH (ref 0.3–1.2)
Total Protein: 5.6 g/dL — ABNORMAL LOW (ref 6.5–8.1)

## 2017-12-11 LAB — MAGNESIUM: Magnesium: 1.1 mg/dL — ABNORMAL LOW (ref 1.7–2.4)

## 2017-12-11 SURGERY — LAPAROSCOPIC CHOLECYSTECTOMY WITH INTRAOPERATIVE CHOLANGIOGRAM
Anesthesia: General | Site: Abdomen

## 2017-12-11 MED ORDER — BUPIVACAINE-EPINEPHRINE (PF) 0.5% -1:200000 IJ SOLN
INTRAMUSCULAR | Status: AC
  Filled 2017-12-11: qty 30

## 2017-12-11 MED ORDER — LACTATED RINGERS IV SOLN
INTRAVENOUS | Status: DC
  Administered 2017-12-11: 14:00:00 via INTRAVENOUS

## 2017-12-11 MED ORDER — EPHEDRINE 5 MG/ML INJ
INTRAVENOUS | Status: AC
  Filled 2017-12-11: qty 10

## 2017-12-11 MED ORDER — SUGAMMADEX SODIUM 200 MG/2ML IV SOLN
INTRAVENOUS | Status: DC | PRN
  Administered 2017-12-11: 200 mg via INTRAVENOUS

## 2017-12-11 MED ORDER — SUCCINYLCHOLINE CHLORIDE 200 MG/10ML IV SOSY
PREFILLED_SYRINGE | INTRAVENOUS | Status: AC
  Filled 2017-12-11: qty 20

## 2017-12-11 MED ORDER — ESMOLOL HCL 100 MG/10ML IV SOLN
INTRAVENOUS | Status: AC
  Filled 2017-12-11: qty 10

## 2017-12-11 MED ORDER — ONDANSETRON HCL 4 MG/2ML IJ SOLN
INTRAMUSCULAR | Status: AC
  Filled 2017-12-11: qty 2

## 2017-12-11 MED ORDER — FENTANYL CITRATE (PF) 250 MCG/5ML IJ SOLN
INTRAMUSCULAR | Status: AC
  Filled 2017-12-11: qty 5

## 2017-12-11 MED ORDER — ROCURONIUM BROMIDE 10 MG/ML (PF) SYRINGE
PREFILLED_SYRINGE | INTRAVENOUS | Status: AC
  Filled 2017-12-11: qty 5

## 2017-12-11 MED ORDER — BUPIVACAINE-EPINEPHRINE 0.5% -1:200000 IJ SOLN
INTRAMUSCULAR | Status: DC | PRN
  Administered 2017-12-11: 20 mL

## 2017-12-11 MED ORDER — SODIUM CHLORIDE 0.9 % IR SOLN
Status: DC | PRN
  Administered 2017-12-11: 1000 mL

## 2017-12-11 MED ORDER — FENTANYL CITRATE (PF) 100 MCG/2ML IJ SOLN: 25.0000 ug | INTRAMUSCULAR | Status: DC | PRN

## 2017-12-11 MED ORDER — PHENYLEPHRINE 40 MCG/ML (10ML) SYRINGE FOR IV PUSH (FOR BLOOD PRESSURE SUPPORT)
PREFILLED_SYRINGE | INTRAVENOUS | Status: AC
  Filled 2017-12-11: qty 10

## 2017-12-11 MED ORDER — FENTANYL CITRATE (PF) 250 MCG/5ML IJ SOLN
INTRAMUSCULAR | Status: DC | PRN
  Administered 2017-12-11: 150 ug via INTRAVENOUS
  Administered 2017-12-11: 50 ug via INTRAVENOUS

## 2017-12-11 MED ORDER — SUCCINYLCHOLINE CHLORIDE 200 MG/10ML IV SOSY
PREFILLED_SYRINGE | INTRAVENOUS | Status: DC | PRN
  Administered 2017-12-11: 120 mg via INTRAVENOUS

## 2017-12-11 MED ORDER — ETOMIDATE 2 MG/ML IV SOLN
INTRAVENOUS | Status: DC | PRN
  Administered 2017-12-11: 10 mg via INTRAVENOUS

## 2017-12-11 MED ORDER — CHLORHEXIDINE GLUCONATE CLOTH 2 % EX PADS: 6.0000 | MEDICATED_PAD | Freq: Once | CUTANEOUS | Status: DC

## 2017-12-11 MED ORDER — PROPOFOL 10 MG/ML IV BOLUS
INTRAVENOUS | Status: AC
  Filled 2017-12-11: qty 20

## 2017-12-11 MED ORDER — MORPHINE SULFATE (PF) 2 MG/ML IV SOLN: 2.0000 mg | INTRAVENOUS | Status: DC | PRN

## 2017-12-11 MED ORDER — SUGAMMADEX SODIUM 200 MG/2ML IV SOLN
INTRAVENOUS | Status: AC
  Filled 2017-12-11: qty 4

## 2017-12-11 MED ORDER — MAGNESIUM SULFATE 2 GM/50ML IV SOLN
2.0000 g | Freq: Once | INTRAVENOUS | Status: AC
  Administered 2017-12-11: 2 g via INTRAVENOUS
  Filled 2017-12-11: qty 50

## 2017-12-11 MED ORDER — SUGAMMADEX SODIUM 200 MG/2ML IV SOLN
INTRAVENOUS | Status: AC
  Filled 2017-12-11: qty 2

## 2017-12-11 MED ORDER — 0.9 % SODIUM CHLORIDE (POUR BTL) OPTIME
TOPICAL | Status: DC | PRN
  Administered 2017-12-11: 1000 mL

## 2017-12-11 MED ORDER — DEXAMETHASONE SODIUM PHOSPHATE 10 MG/ML IJ SOLN
INTRAMUSCULAR | Status: DC | PRN
  Administered 2017-12-11: 10 mg via INTRAVENOUS

## 2017-12-11 MED ORDER — CHLORHEXIDINE GLUCONATE CLOTH 2 % EX PADS
6.0000 | MEDICATED_PAD | Freq: Once | CUTANEOUS | Status: AC
  Administered 2017-12-11: 6 via TOPICAL

## 2017-12-11 MED ORDER — ONDANSETRON HCL 4 MG/2ML IJ SOLN
INTRAMUSCULAR | Status: DC | PRN
  Administered 2017-12-11: 4 mg via INTRAVENOUS

## 2017-12-11 MED ORDER — MAGNESIUM SULFATE 50 % IJ SOLN
2.0000 g | Freq: Once | INTRAVENOUS | Status: DC
  Filled 2017-12-11: qty 4

## 2017-12-11 MED ORDER — IPRATROPIUM-ALBUTEROL 0.5-2.5 (3) MG/3ML IN SOLN
3.0000 mL | Freq: Three times a day (TID) | RESPIRATORY_TRACT | Status: DC
  Administered 2017-12-11 – 2017-12-12 (×4): 3 mL via RESPIRATORY_TRACT
  Filled 2017-12-11 (×5): qty 3

## 2017-12-11 MED ORDER — OXYCODONE HCL 5 MG PO TABS
5.0000 mg | ORAL_TABLET | Freq: Four times a day (QID) | ORAL | Status: DC | PRN
  Administered 2017-12-11 – 2017-12-13 (×5): 5 mg via ORAL
  Filled 2017-12-11 (×5): qty 1

## 2017-12-11 MED ORDER — METOPROLOL TARTRATE 5 MG/5ML IV SOLN
INTRAVENOUS | Status: DC | PRN
  Administered 2017-12-11 (×4): 1 mg via INTRAVENOUS

## 2017-12-11 MED ORDER — SODIUM CHLORIDE 0.9 % IV SOLN
INTRAVENOUS | Status: DC | PRN
  Administered 2017-12-11: 19 mL

## 2017-12-11 MED ORDER — ESMOLOL HCL 100 MG/10ML IV SOLN
INTRAVENOUS | Status: DC | PRN
  Administered 2017-12-11: 50 mg via INTRAVENOUS

## 2017-12-11 MED ORDER — LIDOCAINE 2% (20 MG/ML) 5 ML SYRINGE
INTRAMUSCULAR | Status: DC | PRN
  Administered 2017-12-11: 60 mg via INTRAVENOUS

## 2017-12-11 MED ORDER — DEXAMETHASONE SODIUM PHOSPHATE 10 MG/ML IJ SOLN
INTRAMUSCULAR | Status: AC
  Filled 2017-12-11: qty 1

## 2017-12-11 MED ORDER — ETOMIDATE 2 MG/ML IV SOLN
INTRAVENOUS | Status: AC
  Filled 2017-12-11: qty 10

## 2017-12-11 MED ORDER — ROCURONIUM BROMIDE 10 MG/ML (PF) SYRINGE
PREFILLED_SYRINGE | INTRAVENOUS | Status: DC | PRN
  Administered 2017-12-11: 10 mg via INTRAVENOUS
  Administered 2017-12-11: 30 mg via INTRAVENOUS

## 2017-12-11 MED ORDER — METOPROLOL TARTRATE 5 MG/5ML IV SOLN
INTRAVENOUS | Status: AC
  Filled 2017-12-11: qty 5

## 2017-12-11 MED ORDER — SODIUM CHLORIDE 0.9 % IJ SOLN
INTRAMUSCULAR | Status: AC
  Filled 2017-12-11: qty 20

## 2017-12-11 SURGICAL SUPPLY — 43 items
APPLIER CLIP ROT 10 11.4 M/L (STAPLE)
APR CLP MED LRG 11.4X10 (STAPLE)
BAG SPEC RTRVL 10 TROC 200 (ENDOMECHANICALS) ×1
BLADE CLIPPER SURG (BLADE) IMPLANT
CANISTER SUCT 3000ML PPV (MISCELLANEOUS) ×3 IMPLANT
CATH CHOLANG 76X19 KUMAR (CATHETERS) ×3 IMPLANT
CHLORAPREP W/TINT 26ML (MISCELLANEOUS) ×3 IMPLANT
CLIP APPLIE ROT 10 11.4 M/L (STAPLE) IMPLANT
CLIP VESOLOCK XL 6/CT (CLIP) ×3 IMPLANT
COVER MAYO STAND STRL (DRAPES) ×3 IMPLANT
COVER SURGICAL LIGHT HANDLE (MISCELLANEOUS) ×3 IMPLANT
DERMABOND ADVANCED (GAUZE/BANDAGES/DRESSINGS) ×2
DERMABOND ADVANCED .7 DNX12 (GAUZE/BANDAGES/DRESSINGS) ×1 IMPLANT
DRAPE C-ARM 42X72 X-RAY (DRAPES) ×3 IMPLANT
ELECT REM PT RETURN 9FT ADLT (ELECTROSURGICAL) ×3
ELECTRODE REM PT RTRN 9FT ADLT (ELECTROSURGICAL) ×1 IMPLANT
GLOVE BIOGEL PI IND STRL 7.0 (GLOVE) ×1 IMPLANT
GLOVE BIOGEL PI INDICATOR 7.0 (GLOVE) ×2
GLOVE SURG SS PI 7.0 STRL IVOR (GLOVE) ×3 IMPLANT
GOWN STRL REUS W/ TWL LRG LVL3 (GOWN DISPOSABLE) ×3 IMPLANT
GOWN STRL REUS W/TWL LRG LVL3 (GOWN DISPOSABLE) ×9
GRASPER SUT TROCAR 14GX15 (MISCELLANEOUS) ×3 IMPLANT
KIT BASIN OR (CUSTOM PROCEDURE TRAY) ×3 IMPLANT
KIT ROOM TURNOVER OR (KITS) ×3 IMPLANT
NEEDLE 22X1 1/2 (OR ONLY) (NEEDLE) ×3 IMPLANT
NS IRRIG 1000ML POUR BTL (IV SOLUTION) ×3 IMPLANT
PAD ARMBOARD 7.5X6 YLW CONV (MISCELLANEOUS) ×3 IMPLANT
POUCH RETRIEVAL ECOSAC 10 (ENDOMECHANICALS) ×1 IMPLANT
POUCH RETRIEVAL ECOSAC 10MM (ENDOMECHANICALS) ×2
SCISSORS LAP 5X35 DISP (ENDOMECHANICALS) ×3 IMPLANT
SET CHOLANGIOGRAPH 5 50 .035 (SET/KITS/TRAYS/PACK) ×3 IMPLANT
SET IRRIG TUBING LAPAROSCOPIC (IRRIGATION / IRRIGATOR) ×3 IMPLANT
SLEEVE ENDOPATH XCEL 5M (ENDOMECHANICALS) ×6 IMPLANT
SPECIMEN JAR SMALL (MISCELLANEOUS) ×3 IMPLANT
STOPCOCK 4 WAY LG BORE MALE ST (IV SETS) ×3 IMPLANT
SUT MNCRL AB 4-0 PS2 18 (SUTURE) ×3 IMPLANT
TOWEL OR 17X24 6PK STRL BLUE (TOWEL DISPOSABLE) ×3 IMPLANT
TOWEL OR 17X26 10 PK STRL BLUE (TOWEL DISPOSABLE) ×3 IMPLANT
TRAY LAPAROSCOPIC MC (CUSTOM PROCEDURE TRAY) ×3 IMPLANT
TROCAR XCEL 12X100 BLDLESS (ENDOMECHANICALS) ×3 IMPLANT
TROCAR XCEL NON-BLD 5MMX100MML (ENDOMECHANICALS) ×3 IMPLANT
TUBING INSUFFLATION (TUBING) ×3 IMPLANT
WATER STERILE IRR 1000ML POUR (IV SOLUTION) ×3 IMPLANT

## 2017-12-11 NOTE — Anesthesia Preprocedure Evaluation (Signed)
Anesthesia Evaluation  Patient identified by MRN, date of birth, ID band Patient awake    Reviewed: Allergy & Precautions, H&P , Patient's Chart, lab work & pertinent test results  Airway Mallampati: II  TM Distance: >3 FB Neck ROM: full    Dental no notable dental hx.    Pulmonary former smoker,    Pulmonary exam normal breath sounds clear to auscultation       Cardiovascular Exercise Tolerance: Good hypertension, + Past MI  Atrial Fibrillation  Rhythm:regular Rate:Normal     Neuro/Psych    GI/Hepatic   Endo/Other    Renal/GU      Musculoskeletal   Abdominal   Peds  Hematology  (+) anemia ,   Anesthesia Other Findings 45% EF  Reproductive/Obstetrics                             Anesthesia Physical Anesthesia Plan  ASA: III  Anesthesia Plan: General   Post-op Pain Management:    Induction: Intravenous  PONV Risk Score and Plan: 2 and Dexamethasone, Ondansetron and Treatment may vary due to age or medical condition  Airway Management Planned: Oral ETT  Additional Equipment:   Intra-op Plan:   Post-operative Plan: Extubation in OR  Informed Consent: I have reviewed the patients History and Physical, chart, labs and discussed the procedure including the risks, benefits and alternatives for the proposed anesthesia with the patient or authorized representative who has indicated his/her understanding and acceptance.     Plan Discussed with:   Anesthesia Plan Comments: (  )        Anesthesia Quick Evaluation

## 2017-12-11 NOTE — Anesthesia Procedure Notes (Addendum)
Procedure Name: Intubation Date/Time: 12/11/2017 2:39 PM Performed by: Julian ReilWelty, Paul F, CRNA Pre-anesthesia Checklist: Patient identified, Emergency Drugs available, Suction available, Patient being monitored and Timeout performed Patient Re-evaluated:Patient Re-evaluated prior to induction Oxygen Delivery Method: Circle system utilized Preoxygenation: Pre-oxygenation with 100% oxygen Induction Type: IV induction Ventilation: Mask ventilation without difficulty Laryngoscope Size: Miller and 3 Grade View: Grade I Tube type: Oral Tube size: 7.5 mm Number of attempts: 1 Airway Equipment and Method: Stylet Placement Confirmation: ETT inserted through vocal cords under direct vision,  positive ETCO2,  CO2 detector and breath sounds checked- equal and bilateral Secured at: 22 cm Tube secured with: Tape Dental Injury: Teeth and Oropharynx as per pre-operative assessment

## 2017-12-11 NOTE — Discharge Instructions (Addendum)
Please arrive at least 30 min before your appointment to complete your check in paperwork.  If you are unable to arrive 30 min prior to your appointment time we may have to cancel or reschedule you. ° °LAPAROSCOPIC SURGERY: POST OP INSTRUCTIONS  °1. DIET: Follow a light bland diet the first 24 hours after arrival home, such as soup, liquids, crackers, etc. Be sure to include lots of fluids daily. Avoid fast food or heavy meals as your are more likely to get nauseated. Eat a low fat the next few days after surgery.  °2. Take your usually prescribed home medications unless otherwise directed. °3. PAIN CONTROL:  °1. Pain is best controlled by a usual combination of three different methods TOGETHER:  °1. Ice/Heat °2. Over the counter pain medication °3. Prescription pain medication °2. Most patients will experience some swelling and bruising around the incisions. Ice packs or heating pads (30-60 minutes up to 6 times a day) will help. Use ice for the first few days to help decrease swelling and bruising, then switch to heat to help relax tight/sore spots and speed recovery. Some people prefer to use ice alone, heat alone, alternating between ice & heat. Experiment to what works for you. Swelling and bruising can take several weeks to resolve.  °3. It is helpful to take an over-the-counter pain medication regularly for the first few weeks. Choose one of the following that works best for you:  °1. Naproxen (Aleve, etc) Two 220mg tabs twice a day °2. Ibuprofen (Advil, etc) Three 200mg tabs four times a day (every meal & bedtime) °3. Acetaminophen (Tylenol, etc) 500-650mg four times a day (every meal & bedtime) °4. A prescription for pain medication (such as oxycodone, hydrocodone, etc) should be given to you upon discharge. Take your pain medication as prescribed.  °1. If you are having problems/concerns with the prescription medicine (does not control pain, nausea, vomiting, rash, itching, etc), please call us [phone] to see if we need to switch you to a different pain medicine that will work better for you and/or control your side effect better. °2. If you need a refill on your pain medication, please contact your pharmacy. They will contact our office to request authorization. Prescriptions will not be filled after 5 pm or on week-ends. °4. Avoid getting constipated. Between the surgery and the pain medications, it is common to experience some constipation. Increasing fluid intake and taking a fiber supplement (such as Metamucil, Citrucel, FiberCon, MiraLax, etc) 1-2 times a day regularly will usually help prevent this problem from occurring. A mild laxative (prune juice, Milk of Magnesia, MiraLax, etc) should be taken according to package directions if there are no bowel movements after 48 hours.  °5. Watch out for diarrhea. If you have many loose bowel movements, simplify your diet to bland foods & liquids for a few days. Stop any stool softeners and decrease your fiber supplement. Switching to mild anti-diarrheal medications (Kayopectate, Pepto Bismol) can help. If this worsens or does not improve, please call us. °6. Wash / shower every day. You may shower over the dressings as they are waterproof. Continue to shower over incision(s) after the dressing is off. If there is glue over the incisions try not to pick it off, let it fall off naturally. °7. Remove your waterproof bandages 2 days after surgery. You may leave the incision open to air. You may replace a dressing/Band-Aid to cover the incision for comfort if you wish.  °8. ACTIVITIES as tolerated:  °  1. You may resume regular (light) daily activities beginning the next day--such as daily self-care, walking, climbing stairs--gradually increasing activities as tolerated. If you can walk 30 minutes without difficulty, it is safe to try more intense activity such as jogging, treadmill, bicycling, low-impact aerobics, swimming, etc. °2. Save the most intensive and  strenuous activity for last such as sit-ups, heavy lifting, contact sports, etc Refrain from any heavy lifting or straining until you are off narcotics for pain control. For the first 2-3 weeks do not lift over 10-15lb.  °3. DO NOT PUSH THROUGH PAIN. Let pain be your guide: If it hurts to do something, don't do it. Pain is your body warning you to avoid that activity for another week until the pain goes down. °4. You may drive when you are no longer taking prescription pain medication, you can comfortably wear a seatbelt, and you can safely maneuver your car and apply brakes. °5. You may have sexual intercourse when it is comfortable.  °9. FOLLOW UP in our office  °1. Please call [facility] at [phone] to set up an appointment to see your surgeon in the office for a follow-up appointment approximately 2-3 weeks after your surgery. °2. Make sure that you call for this appointment the day you arrive home to insure a convenient appointment time. °     10. IF YOU HAVE DISABILITY OR FAMILY LEAVE FORMS, BRING THEM TO THE               OFFICE FOR PROCESSING.  ° °WHEN TO CALL US [phone]:  °1. Poor pain control °2. Reactions / problems with new medications (rash/itching, nausea, etc)  °3. Fever over 101.5 F (38.5 C) °4. Inability to urinate °5. Nausea and/or vomiting °6. Worsening swelling or bruising °7. Continued bleeding from incision. °8. Increased pain, redness, or drainage from the incision ° °The clinic staff is available to answer your questions during regular business hours (8:30am-5pm). Please don’t hesitate to call and ask to speak to one of our nurses for clinical concerns.  °If you have a medical emergency, go to the nearest emergency room or call 911.  °A surgeon from [facility] is always on call at the hospitals  ° °[facility]  °[address], [address], [place], [place] [address] ?  °MAIN: [phone] ? TOLL FREE: [phone] ?  °FAX [phone]    °[email or web address] ° °Soft-Food Meal Plan °Follow for 3-5 days after discharge °A soft-food meal plan includes foods that are safe and easy to swallow. This meal plan typically is used: °· If you are having trouble chewing or swallowing foods. °· As a transition meal plan after only having had liquid meals for a long period. ° °What do I need to know about the soft-food meal plan? °A soft-food meal plan includes tender foods that are soft and easy to chew and swallow. In most cases, bite-sized pieces of food are easier to swallow. A bite-sized piece is about ½ inch or smaller. Foods in this plan do not need to be ground or pureed. °Foods that are very hard, crunchy, or sticky should be avoided. Also, breads, cereals, yogurts, and desserts with nuts, seeds, or fruits should be avoided. °What foods can I eat? °Grains °Rice and wild rice. Moist bread, dressing, pasta, and noodles. Well-moistened dry or cooked cereals, such as farina (cooked wheat cereal), oatmeal, or grits. Biscuits, breads, muffins, pancakes, and waffles that have been well moistened. °Vegetables °Shredded lettuce. Cooked,   tender vegetables, including potatoes without skins. Vegetable juices. Broths or creamed soups made with vegetables that are not stringy or chewy. Strained tomatoes (without seeds). °Fruits °Canned or well-cooked fruits. Soft (ripe), peeled fresh fruits, such as peaches, nectarines, kiwi, cantaloupe, honeydew melon, and watermelon (without seeds). Soft berries with small seeds, such as strawberries. Fruit juices (without pulp). °Meats and Other Protein Sources °Moist, tender, lean beef. Mutton. Lamb. Veal. Chicken. Turkey. Liver. Ham. Fish without bones. Eggs. °Dairy °Milk, milk drinks, and cream. Plain cream cheese and cottage cheese. Plain yogurt. °Sweets/Desserts °Flavored gelatin desserts. Custard. Plain ice cream, frozen yogurt, sherbet, milk shakes, and malts. Plain cakes and cookies. Plain hard  candy. °Other °Butter, margarine (without trans fat), and cooking oils. Mayonnaise. Cream sauces. Mild spices, salt, and sugar. Syrup, molasses, honey, and jelly. °The items listed above may not be a complete list of recommended foods or beverages. Contact your dietitian for more options. °What foods are not recommended? °Grains °Dry bread, toast, crackers that have not been moistened. Coarse or dry cereals, such as bran, granola, and shredded wheat. Tough or chewy crusty breads, such as French bread or baguettes. °Vegetables °Corn. Raw vegetables except shredded lettuce. Cooked vegetables that are tough or stringy. Tough, crisp, fried potatoes and potato skins. °Fruits °Fresh fruits with skins or seeds or both, such as apples, pears, or grapes. Stringy, high-pulp fruits, such as papaya, pineapple, coconut, or mango. Fruit leather, fruit roll-ups, and all dried fruits. °Meats and Other Protein Sources °Sausages and hot dogs. Meats with gristle. Fish with bones. Nuts, seeds, and chunky peanut or other nut butters. °Sweets/Desserts °Cakes or cookies that are very dry or chewy. °The items listed above may not be a complete list of foods and beverages to avoid. Contact your dietitian for more information. °This information is not intended to replace advice given to you by your health care provider. Make sure you discuss any questions you have with your health care provider. °Document Released: 02/04/2008 Document Revised: 04/04/2016 Document Reviewed: 09/24/2013 °Elsevier Interactive Patient Education © 2017 [employer] ° °

## 2017-12-11 NOTE — Progress Notes (Signed)
[facility]/Trauma Progress Note  3 Days Post-Op   Assessment/Plan AFib CAD/Hx of MI in 2011 Aortic stenosis - s/p valve replacement HTN COPD Diverticulosis Gout  Gallstone Pancreatitis - CT 1/28: changes of acute pancreatitis involving the head and the body of the pancreas, gallstones; colonic ileus - lipase 57 on 1/30 - Tbili 1.4 on 1/31, AST/ALT WNL Colonic Ileus - s/p decompression by GI - CT as above - repeat KUB showed normal bowel pattern - +stool and flatus - NGT will be removed after surgery - mobilize as tolerated Hypomagnesemia - Mg 1.1, replace before surgery  FEN: NPO, IVF; NGT clamped VTE: SCDs, heparin being held ID: IV flagyl 1/28>>  OR today for lap chole, replace IV mag    LOS: 9 days    Subjective: CC: mild abdominal pain, 3/10  Pain is improving. Had a BM and flatus. Tube clamped and no nausea. No bloating. Wife at bedside.   Objective: Vital signs in last 24 hours: Temp:  [98.2 F (36.8 C)-98.4 F (36.9 C)] 98.2 F (36.8 C) (01/31 0442) Pulse Rate:  [77-95] 95 (01/31 0442) Resp:  [17-18] 17 (01/31 0442) BP: (133-144)/(76-80) 142/80 (01/31 0442) SpO2:  [98 %-99 %] 98 % (01/31 0442) Last BM Date: 12/11/17  Intake/Output from previous day: 01/30 0701 - 01/31 0700 In: 2526.8 [P.O.:320; I.V.:2106.8; IV Piggyback:100] Out: 1125 [Urine:825; Emesis/NG output:300] Intake/Output this shift: No intake/output data recorded.  PE: Gen:  Alert, NAD, pleasant, cooperative Card:  Irregularly irregular, rate normal, no M/G/R heard Pulm:  CTA anteriorly, no W/R/R, rate and effort normal Abd: Soft, not distended, +BS, nontender Skin: no rashes noted, warm and dry   Anti-infectives: Anti-infectives (From admission, onward)   Start     Dose/Rate Route Frequency Ordered Stop   12/11/17 0800  cefTRIAXone (ROCEPHIN) 2 g in dextrose 5 % 50 mL IVPB     2 g 100 mL/hr over 30 Minutes Intravenous  Once 12/10/17 1459     12/08/17 1800   metroNIDAZOLE (FLAGYL) IVPB 500 mg     500 mg 100 mL/hr over 60 Minutes Intravenous Every 8 hours 12/08/17 1633     12/08/17 0600  cefoTEtan (CEFOTAN) 2 g in dextrose 5 % 50 mL IVPB  Status:  Discontinued     2 g 100 mL/hr over 30 Minutes Intravenous On call to O.R. 12/08/17 0009 12/08/17 1516   12/05/17 0600  ceFAZolin (ANCEF) IVPB 2g/100 mL premix     2 g 200 mL/hr over 30 Minutes Intravenous On call to O.R. 12/04/17 1455 12/06/17 0559   12/02/17 1000  piperacillin-tazobactam (ZOSYN) IVPB 3.375 g  Status:  Discontinued     3.375 g 12.5 mL/hr over 240 Minutes Intravenous Every 8 hours 12/02/17 0947 12/08/17 1440   12/02/17 0945  piperacillin-tazobactam (ZOSYN) IVPB 3.375 g  Status:  Discontinued     3.375 g 100 mL/hr over 30 Minutes Intravenous Every 8 hours 12/02/17 0941 12/02/17 0946      Lab Results:  Recent Labs    12/10/17 1148 12/11/17 0536  WBC 8.2 8.5  HGB 10.0* 10.6*  HCT 30.8* 32.2*  PLT 254 266   BMET Recent Labs    12/10/17 1148 12/11/17 0536  NA 143 138  K 4.3 3.8  CL 110 106  CO2 20* 24  GLUCOSE 56* 166*  BUN 17 10  CREATININE 1.10 0.80  CALCIUM 8.9 8.7*   PT/INR Recent Labs    12/10/17 1148 12/11/17 0536  LABPROT 16.5* 17.3*  INR 1.34 1.42  CMP     Component Value Date/Time   NA 138 12/11/2017 0536   K 3.8 12/11/2017 0536   CL 106 12/11/2017 0536   CO2 24 12/11/2017 0536   GLUCOSE 166 (H) 12/11/2017 0536   BUN 10 12/11/2017 0536   CREATININE 0.80 12/11/2017 0536   CREATININE 0.85 07/29/2016 1611   CALCIUM 8.7 (L) 12/11/2017 0536   PROT 5.6 (L) 12/11/2017 0536   ALBUMIN 2.2 (L) 12/11/2017 0536   AST 20 12/11/2017 0536   ALT 21 12/11/2017 0536   ALKPHOS 89 12/11/2017 0536   BILITOT 1.4 (H) 12/11/2017 0536   GFRNONAA >60 12/11/2017 0536   GFRAA >60 12/11/2017 0536   Lipase     Component Value Date/Time   LIPASE 57 (H) 12/10/2017 1148    Studies/Results: Dg Abd Portable 1v  Result Date: 12/10/2017 CLINICAL DATA:  72 year old  male with abdominal pain. Pancreatitis. Large bowel distention on CT 2 days ago favored due to colonic ileus. EXAM: PORTABLE ABDOMEN - 1 VIEW COMPARISON:  CT Abdomen and Pelvis 12/08/2017 and earlier. FINDINGS: Portable AP supine view at 1332 hrs. Stable enteric tube with side hole up the level of the proximal gastric body. Resolved gas distended large bowel loops. Similar gas-filled nondilated small bowel. The gas pattern now is nonobstructed. No definite pneumoperitoneum on this supine view. No acute osseous abnormality identified. IMPRESSION: Resolved gas distended colon and essentially normalized bowel-gas pattern since 12/08/2017. Electronically Signed   By: Odessa FlemingH  Hall M.D.   On: 12/10/2017 14:08      Jerre SimonJessica L Kameisha Malicki , [facility] 12/11/2017, 8:22 AM Pager: [phone] Consults: [phone] Mon-Fri 7:00 am-4:30 pm Sat-Sun 7:00 am-11:30 am

## 2017-12-11 NOTE — Transfer of Care (Signed)
Immediate Anesthesia Transfer of Care Note  Patient: [name]  Procedure(s) Performed: LAPAROSCOPIC CHOLECYSTECTOMY WITH INTRAOPERATIVE CHOLANGIOGRAM (N/A Abdomen)  Patient Location: PACU  Anesthesia Type:General  Level of Consciousness: awake, alert  and oriented  Airway & Oxygen Therapy: Patient Spontanous Breathing and Patient connected to nasal cannula oxygen  Post-op Assessment: Report given to RN and Post -op Vital signs reviewed and stable  Post vital signs: Reviewed and stable  Last Vitals:  Vitals:   12/11/17 1121 12/11/17 1254  BP:  132/90  Pulse:  (!) 101  Resp:  16  Temp:  36.7 C  SpO2: 97% 98%    Last Pain:  Vitals:   12/11/17 1254  TempSrc: Oral  PainSc:       Patients Stated Pain Goal: 2 (12/08/17 1248)  Complications: No apparent anesthesia complications

## 2017-12-11 NOTE — Progress Notes (Addendum)
[facility]                                                                              Patient Demographics  [name], is a 72 y.o. male, DOB - Sep 23, 1946, WUJ:[id number]  Admit date - 12/02/2017   Admitting Physician Catarina Hartshorn, MD  Outpatient Primary MD for the patient is Benita Stabile, MD  Outpatient specialists:   LOS - 9  days   Medical records reviewed and are as summarized below:    Chief Complaint  Patient presents with  . Abdominal Pain       Brief summary   Patient is a 72 year old male with history of chronic atrial fibrillation status post maze, failed DC CV, CAD, aortic stenosis status post bioprosthetic AVR, hyperlipidemia, hypertension presented with abdominal pain, nausea and vomiting on 1/22.  He was found to have lipase over 3000 with elevated LFTs. CT of the abdomen and pelvis showed cholelithiasis with gallbladder wall thickening with intra and extrahepatic ductal dilatation as well as common bile duct dilatation.    Patient was admitted for further workup. Patient was seen by GI, surgery and cardiology (for preop clearance).  Patient's procedures were initially delayed due to iatrogenic coagulopathy (Coumadin for A. Fib).  Underwent ERCP on 1/24 however during ERCP, had A. fib with RVR with heart rate into 190s.  Cholecystectomy was canceled.  On 1/28, patient had marked distention of his colon up to 17 cm on x-ray with concern for possible volvulus.  CT abdomen was negative, patient underwent colonoscopic decompression by Dr. Darrick Penna.  Antibiotics were restarted due to concern for mild ischemic colitis.  Family requested transfer to [facility].   Assessment & Plan    Principal Problem:   Gallstone pancreatitis, choledocholithiasis -CT of the abdomen at the time of admission showed cholelithiasis with gallbladder wall thickening, intra-and extrahepatic ductal dilatation, CBD dilatation, lipase> 3000 -ERCP 1/24 showed choledocholithiasis  status post successful endoscopic sphincterotomy, balloon dilatation and stone extraction. -Plan for cholecystectomy today, n.p.o. -Continue IV heparin drip, hold per surgery's recommendations before  Active Problems:  Colonic ileus -Patient underwent colonoscopic decompression on 1/28 at [facility] -Improving, had a BM today.  -Continue IV fluids, Flagyl  Hypomagnesemia -Magnesium still low, will give another dose of magnesium 2 g IV x1    Atrial fibrillation, chronic (HCC)  -Currently rate controlled, continue IV Lopressor 5 mg every 6 hours -On IV heparin drip, on hold for surgery today  History of COPD Mild scattered wheezing bilaterally today, placed on-DuoNeb's   Code Status: Full code DVT Prophylaxis: IV heparin Family Communication: Discussed in detail with the patient, all imaging results, lab results explained to the patient and wife at the bedside   Disposition Plan:  Time Spent in minutes 25 minutes  Procedures:    Consultants:   General surgery Patient followed by gastroenterology, surgery, cardiology at [facility]  Antimicrobials:   IV Flagyl   Medications  Scheduled Meds: . bisacodyl  5 mg Oral QPM  . Chlorhexidine Gluconate Cloth  6 each Topical Once  . feeding supplement  1 Container Oral TID BM  . ipratropium-albuterol  3 mL  Nebulization TID  . metoprolol tartrate  5 mg Intravenous Q6H  . pantoprazole (PROTONIX) IV  40 mg Intravenous QHS   Continuous Infusions: . cefTRIAXone (ROCEPHIN)  IV    . dextrose 5 % and 0.45 % NaCl with KCl 20 mEq/L 100 mL/hr at 12/11/17 0106  . metronidazole Stopped (12/11/17 1150)   PRN Meds:.acetaminophen **OR** acetaminophen, promethazine   Antibiotics   Anti-infectives (From admission, onward)   Start     Dose/Rate Route Frequency Ordered Stop   12/11/17 0800  cefTRIAXone (ROCEPHIN) 2 g in dextrose 5 % 50 mL IVPB     2 g 100 mL/hr over 30 Minutes Intravenous  Once 12/10/17 1459     12/08/17 1800   metroNIDAZOLE (FLAGYL) IVPB 500 mg     500 mg 100 mL/hr over 60 Minutes Intravenous Every 8 hours 12/08/17 1633     12/08/17 0600  cefoTEtan (CEFOTAN) 2 g in dextrose 5 % 50 mL IVPB  Status:  Discontinued     2 g 100 mL/hr over 30 Minutes Intravenous On call to O.R. 12/08/17 0009 12/08/17 1516   12/05/17 0600  ceFAZolin (ANCEF) IVPB 2g/100 mL premix     2 g 200 mL/hr over 30 Minutes Intravenous On call to O.R. 12/04/17 1455 12/06/17 0559   12/02/17 1000  piperacillin-tazobactam (ZOSYN) IVPB 3.375 g  Status:  Discontinued     3.375 g 12.5 mL/hr over 240 Minutes Intravenous Every 8 hours 12/02/17 0947 12/08/17 1440   12/02/17 0945  piperacillin-tazobactam (ZOSYN) IVPB 3.375 g  Status:  Discontinued     3.375 g 100 mL/hr over 30 Minutes Intravenous Every 8 hours 12/02/17 0941 12/02/17 0946        Subjective:   [name] was seen and examined today.  Abdominal distention better today, had BM this morning.  NGT clamped.  No nausea or vomiting.   Patient denies dizziness, chest pain, shortness of breath, new weakness, numbess, tingling.  Anxious about the plan and surgery.  Objective:   Vitals:   12/11/17 0034 12/11/17 0442 12/11/17 1121 12/11/17 1254  BP: (!) 144/76 (!) 142/80  132/90  Pulse: 91 95  (!) 101  Resp: 18 17  16   Temp: 98.2 F (36.8 C) 98.2 F (36.8 C)  98.1 F (36.7 C)  TempSrc:  Oral  Oral  SpO2: 98% 98% 97% 98%  Weight:      Height:        Intake/Output Summary (Last 24 hours) at 12/11/2017 1313 Last data filed at 12/11/2017 1154 Gross per 24 hour  Intake 2076.77 ml  Output 825 ml  Net 1251.77 ml     Wt Readings from Last 3 Encounters:  12/09/17 92.3 kg (203 lb 7.8 oz)  07/24/17 87.5 kg (193 lb)  07/29/16 85.3 kg (188 lb)     Exam General: Alert and oriented x 3, NAD Eyes:  HEENT:   Cardiovascular: S1 S2 clear, irregularly irregular Respiratory: Mild scattered wheezing bilaterally Gastrointestinal: Soft, nontender, ND, + bowel sounds Ext: no  pedal edema bilaterally Neuro: no new deficit Musculoskeletal: No digital cyanosis, clubbing Skin: No rashes Psych: Normal affect and demeanor, alert and oriented x3      Data Reviewed:  I have personally reviewed following labs and imaging studies  Micro Results Recent Results (from the past 240 hour(s))  Surgical pcr screen     Status: None   Collection Time: 12/06/17 10:01 AM  Result Value Ref Range Status   MRSA, PCR NEGATIVE NEGATIVE Final   Staphylococcus  aureus NEGATIVE NEGATIVE Final    Comment: (NOTE) The Xpert SA Assay (FDA approved for NASAL specimens in patients 73 years of age and older), is one component of a comprehensive surveillance program. It is not intended to diagnose infection nor to guide or monitor treatment.   Surgical PCR screen     Status: None   Collection Time: 12/10/17  6:30 PM  Result Value Ref Range Status   MRSA, PCR NEGATIVE NEGATIVE Final   Staphylococcus aureus NEGATIVE NEGATIVE Final    Comment: (NOTE) The Xpert SA Assay (FDA approved for NASAL specimens in patients 73 years of age and older), is one component of a comprehensive surveillance program. It is not intended to diagnose infection nor to guide or monitor treatment.     Radiology Reports Dg Chest 1 View  Result Date: 12/06/2017 CLINICAL DATA:  Nasogastric tube placement EXAM: CHEST 1 VIEW COMPARISON:  4 days prior FINDINGS: Nasogastric tube tip reaches the stomach with side port at the GE junction. Low lung volumes with bandlike opacity at the left base. Cardiomegaly. History of aortic valve replacement. No Kerley lines, effusion, or pneumothorax. IMPRESSION: 1. Nasogastric tube with tip at the proximal stomach. 2. Atelectasis at the left base. Electronically Signed   By: Marnee SpringJonathon  Watts M.D.   On: 12/06/2017 15:03   Dg Abd 1 View  Result Date: 12/08/2017 CLINICAL DATA:  Ileus. EXAM: ABDOMEN - 1 VIEW COMPARISON:  Radiograph of December 07, 2017. FINDINGS: Nasogastric tube tip is  seen in expected position of distal stomach. No small bowel dilatation is noted. Distention of colon is again noted measuring 18 cm, which is not significantly changed compared to prior exam. IMPRESSION: Grossly stable colonic dilatation as described above. Electronically Signed   By: Lupita RaiderJames  Green Jr, M.D.   On: 12/08/2017 10:24   Dg Abd 1 View  Result Date: 12/07/2017 CLINICAL DATA:  Patient with history of small bowel obstruction. Abdominal tightness. EXAM: ABDOMEN - 1 VIEW COMPARISON:  Abdominal radiograph 12/06/2017. FINDINGS: Gas is demonstrated within the small bowel throughout the abdomen. Enteric tube tip and side-port project over the stomach. Interval increase in gaseous distention of the colon measuring up to 17 cm. Supine evaluation limited for the detection of free intraperitoneal air. Lumbar spine degenerative changes. IMPRESSION: Marked gaseous distention of the colon measuring up to 17 cm, increased from prior. Electronically Signed   By: Annia Beltrew  Davis M.D.   On: 12/07/2017 14:48   Ct Abdomen Pelvis W Contrast  Result Date: 12/08/2017 CLINICAL DATA:  Abdominal pain and distention. Evaluate for volvulus. EXAM: CT ABDOMEN AND PELVIS WITH CONTRAST TECHNIQUE: Multidetector CT imaging of the abdomen and pelvis was performed using the standard protocol following bolus administration of intravenous contrast. CONTRAST:  100mL ISOVUE-300 IOPAMIDOL (ISOVUE-300) INJECTION 61% COMPARISON:  12/02/2017 FINDINGS: Lower chest: Dependent changes are noted in both lung bases. Small right pleural effusion. Hepatobiliary: No suspicious liver abnormality. Tiny stones noted within the gallbladder. Pancreas: There is diffuse edema and inflammation involving the pancreas. Most severe at the head through body compatible acute pancreatitis. There is surrounding fat stranding identified. No pseudocyst identified. Spleen: Normal in size without focal abnormality. Adrenals/Urinary Tract: The adrenal glands are normal. The  right kidney is normal. Tiny cyst arises from the upper pole the left kidney. No hydronephrosis. Urinary bladder appears normal. Stomach/Bowel: Enteric tube is identified within the stomach. The small bowel loops have a normal course and caliber. There is a large mobile cecum which extends across the midline to the  left upper quadrant of the abdomen. The ileocecal valve is identified within the left abdomen, image 37 of series 3. The distended cecum measures up to 10.4 cm and has air-contrast level. Air-filled loop of transverse colon is identified. Transition to normal to decreased caliber: Is identified at the level of the splenic flexure. Distal colonic diverticula noted without acute inflammation. Vascular/Lymphatic: Aortic atherosclerosis. No aneurysm. No upper abdominal or pelvic adenopathy. Reproductive: Prostate is unremarkable. Other: Trace ascites identified within the upper abdomen. No focal fluid collections. Musculoskeletal: There is degenerative disc disease identified throughout the lumbar spine. No suspicious bone lesions. IMPRESSION: 1. Changes of acute pancreatitis involving the head through body of pancreas noted. No evidence for pseudocyst formation. Tiny gallstones are identified within the gallbladder. No definite stones within the common bile duct. 2. Abnormal distension of the cecum through the transverse colon to the level of the splenic flexure. No evidence for cecal volvulus. Favor colonic ileus. 3.  Aortic Atherosclerosis (ICD10-I70.0). 4. Small right effusion. Dependent changes noted within both lung bases. Electronically Signed   By: Signa Kell M.D.   On: 12/08/2017 12:46   Ct Abdomen Pelvis W Contrast  Result Date: 12/02/2017 CLINICAL DATA:  Altered bowel habits. Treatment for diarrhea has resulted in no bowel movement for 2 days. Increasing abdominal pain. EXAM: CT ABDOMEN AND PELVIS WITH CONTRAST TECHNIQUE: Multidetector CT imaging of the abdomen and pelvis was performed  using the standard protocol following bolus administration of intravenous contrast. CONTRAST:  ISOVUE-300 IOPAMIDOL (ISOVUE-300) INJECTION 61% COMPARISON:  Multiple exams, including CT scan from 07/23/2012 FINDINGS: Lower chest: Moderate cardiomegaly. Aortic valve prosthesis. Posterior wall calcifications in the left atrium and to a lesser extent in the right atrium. Thinning of the left ventricular myocardium. Mild atelectasis in the lingula and mild dependent atelectasis in both lower lobes. Circumflex coronary artery atherosclerotic calcification. Hepatobiliary: Mild intrahepatic biliary dilatation. Common hepatic duct 1.6 cm, formerly 0.8 cm on 07/23/2012. Common bile duct 1.2 cm, formerly 0.8 cm. This CBD dilatation extends to the ampulla Dependent densities in the gallbladder are compatible with gallstones. Borderline wall thickening in portions of the gallbladder. Pancreas: Abnormal peripancreatic stranding diffusely without pseudocyst, abscess, or necrosis. Spleen: Unremarkable Adrenals/Urinary Tract: Stable fullness of the right adrenal gland. Left adrenal gland unremarkable. 0.7 by 1.4 cm hypodense lesion of the left kidney upper pole is likely a small cyst but technically too small to characterize. Stomach/Bowel: Mild wall thickening and accentuated enhancement in the duodenal bulb, descending duodenum, and proximal transverse duodenum noted, secondary inflammation is favored over peptic ulcer disease. Several small diverticula of the transverse duodenum do not appear to be the epicenter of the regional inflammatory process. Descending and sigmoid colon diverticulosis without active diverticulitis. Scattered diverticula of the ascending colon noted. Appendix normal. Vascular/Lymphatic: Aortoiliac atherosclerotic vascular disease. Reproductive: Unremarkable Other: Small amount of free fluid adjacent to the descending duodenum and tracking in the right paracolic gutter. Small amount of central  mesenteric edema. Musculoskeletal: Old healed right lower lateral rib fractures. Subacute healing fracture of the left anterior fifth and sixth ribs. Prior median sternotomy. Mildly exaggerated lumbar lordosis. Grade 1 degenerative anterolisthesis at L5-S1 with slightly transitional S1 level. Lumbar spondylosis and degenerative disc disease with mild impingement at L4-5 and L5-S1. IMPRESSION: 1. Acute pancreatitis with peripancreatic stranding and a small amount of ascites tracking in the right paracolic gutter. No findings of pancreatic abscess, necrosis, or pseudocyst. 2. Gallstones are present in the gallbladder and there is new intrahepatic and extrahepatic biliary dilatation,  raising the strong possibility of occult distal CBD stone as a potential cause for the pancreatitis and biliary dilatation. Minimal gallbladder wall thickening. 3. Subacute healing fractures of the left anterior fifth and sixth ribs. 4. Cardiomegaly with wall calcifications along the atria and thinning of the left ventricular myocardium. Coronary atherosclerosis. Scattered colonic diverticulosis diverticula of the transverse duodenum. There are also some. 5.  Aortic Atherosclerosis (ICD10-I70.0). 6. Mild lumbar impingement at L4-5 and L5-S1. Electronically Signed   By: Gaylyn Rong M.D.   On: 12/02/2017 07:48   Mr 3d Recon At Scanner  Result Date: 12/02/2017 CLINICAL DATA:  Upper abdominal pain. Abnormal liver function tests. Dilated common bile duct on ultrasound. Acute pancreatitis on CT scan. EXAM: MRI ABDOMEN WITHOUT AND WITH CONTRAST (INCLUDING MRCP) TECHNIQUE: Multiplanar multisequence MR imaging of the abdomen was performed both before and after the administration of intravenous contrast. Heavily T2-weighted images of the biliary and pancreatic ducts were obtained, and three-dimensional MRCP images were rendered by post processing. CONTRAST:  18mL MULTIHANCE GADOBENATE DIMEGLUMINE 529 MG/ML IV SOLN COMPARISON:  12/02/2017  CT scan FINDINGS: Despite efforts by the technologist and patient, motion artifact is present on today's exam and could not be eliminated. This is a common result when MRCP is attempted in the inpatient setting where patients are less likely to be able to breath hold and cooperate in controlling motion. This reduces exam sensitivity and specificity. Lower chest: Stable cardiomegaly. Aortic valve prosthesis. Mild atelectasis in the posterior basal segments of both lower lobes. Hepatobiliary: Cholelithiasis is observed. On images 45 through 50 of series 5, there is low signal intensity filling defect in the distal CBD compatible with small stones in the setting of choledocholithiasis. This is a likely cause for the biliary dilatation and pancreatitis. There appear to be multiple clustered calculi in the 5 mm in less range in the vicinity of the ampulla. The filling defects are also shown on image 23/4 and images 29 through 31 of series 24. Mild extrahepatic biliary dilatation, with the common bile duct at about 9 mm. Pancreas: Peripancreatic edema compatible with acute pancreatitis. No abscess, pseudocyst, or findings of pancreatic necrosis. Spleen:  Unremarkable Adrenals/Urinary Tract:  Unremarkable Stomach/Bowel: Mild secondary inflammation of the duodenum. Vascular/Lymphatic:  Aortoiliac atherosclerotic vascular disease. Other: Small amount of ascites along the inferior right hepatic margin. Musculoskeletal: Unremarkable IMPRESSION: 1. Choledocholithiasis with several clustered small (less than 5 mm) stones in the distal CBD. These at the likely cause of the mild biliary dilatation and acute pancreatitis. 2. Peripancreatic edema compatible with acute pancreatitis. No findings of pancreatic abscess, pseudocyst, or pancreatic necrosis. 3. Stable cardiomegaly with aortic valve prosthesis. 4. Mild subsegmental atelectasis in the posterior basal segments of both lower lobes. 5. Gallstones in the gallbladder. 6.  Aortic  Atherosclerosis (ICD10-I70.0). Electronically Signed   By: Gaylyn Rong M.D.   On: 12/02/2017 15:13   Dg Chest Port 1 View  Result Date: 12/07/2017 CLINICAL DATA:  Status post NG tube placement. EXAM: PORTABLE CHEST 1 VIEW COMPARISON:  Chest radiograph 12/06/2017. FINDINGS: Enteric tube tip and side-port project over the stomach. Stable cardiac and mediastinal contours status post median sternotomy. Focal consolidation left lower hemithorax. No pleural effusion or pneumothorax. Gaseous distended loops of bowel within the upper abdomen. IMPRESSION: Enteric tube tip and side-port project over the stomach. Gaseous distended loops of bowel within the upper abdomen. Consider dedicated abdominal radiography. Focal opacity left lower hemithorax may represent atelectasis. Infection not excluded. Electronically Signed   By: Annia Belt  M.D.   On: 12/07/2017 21:32   Dg Ercp Biliary & Pancreatic Ducts  Result Date: 12/04/2017 CLINICAL DATA:  Choledocholithiasis EXAM: ERCP WITH SPHINCTEROTOMY TECHNIQUE: Multiple spot images obtained with the fluoroscopic device and submitted for interpretation post-procedure. FLUOROSCOPY TIME:  Fluoroscopy Time:  2 minutes 51 seconds Radiation Exposure Index (if provided by the fluoroscopic device): Not provided Number of Acquired Spot Images: multiple fluoroscopic screen captures COMPARISON:  MRCP 12/02/2017 FINDINGS: Images demonstrate upper normal diameter CBD with smooth walls. A few small filling defects are seen at the distal CBD compatible with choledocholithiasis. Multiple small air bubbles are seen later in the exam within the distal CBD. Passage of a balloon catheter was made with clearance of filling defects. Endoscopic visualization confirmed removal of stones with balloon catheter passage. Last image demonstrates adequate drainage of contrast from the the CBD IMPRESSION: Choledocholithiasis. These images were submitted for radiologic interpretation only. Please see  the procedural report for the amount of contrast and the fluoroscopy time utilized. Electronically Signed   By: Ulyses Southward M.D.   On: 12/04/2017 14:19   Dg Abdomen Acute W/chest  Result Date: 12/02/2017 CLINICAL DATA:  Abdominal pain since this morning. EXAM: DG ABDOMEN ACUTE W/ 1V CHEST COMPARISON:  Chest 02/05/2017 FINDINGS: Postoperative changes in the mediastinum. Shallow inspiration with atelectasis in the lung bases. Cardiac enlargement without vascular congestion or edema. No focal consolidation. No pneumothorax. Calcification of the aorta. Severe degenerative changes in the shoulders with resection or resorption of the distal left clavicle. Scattered gas and stool throughout the colon. No small or large bowel distention. No free intra-abdominal air. No abnormal air-fluid levels. Degenerative changes in the spine and hips. IMPRESSION: Shallow inspiration with atelectasis in the lung bases. Cardiac enlargement. Aortic atherosclerosis. Nonobstructive bowel gas pattern with stool-filled colon. Electronically Signed   By: Burman Nieves M.D.   On: 12/02/2017 06:17   Dg Abd Portable 1v  Result Date: 12/10/2017 CLINICAL DATA:  72 year old male with abdominal pain. Pancreatitis. Large bowel distention on CT 2 days ago favored due to colonic ileus. EXAM: PORTABLE ABDOMEN - 1 VIEW COMPARISON:  CT Abdomen and Pelvis 12/08/2017 and earlier. FINDINGS: Portable AP supine view at 1332 hrs. Stable enteric tube with side hole up the level of the proximal gastric body. Resolved gas distended large bowel loops. Similar gas-filled nondilated small bowel. The gas pattern now is nonobstructed. No definite pneumoperitoneum on this supine view. No acute osseous abnormality identified. IMPRESSION: Resolved gas distended colon and essentially normalized bowel-gas pattern since 12/08/2017. Electronically Signed   By: Odessa Fleming M.D.   On: 12/10/2017 14:08   Dg Abd Portable 1v  Result Date: 12/06/2017 CLINICAL DATA:   Nausea, abdominal distension, recent ERCP and sphincterotomy with extraction of CBD stones EXAM: PORTABLE ABDOMEN - 1 VIEW COMPARISON:  Portable exam 1042 hours compared to 12/02/2017 FINDINGS: Air-filled small bowel loops throughout abdomen. Gaseous distention of transverse colon up to 13.2 cm diameter. No definite bowel wall thickening. Bones demineralized with degenerative disc and facet disease changes of the lumbar spine. IMPRESSION: Gaseous distention of colon up to 13.2 cm diameter. Additional air-filled upper normal caliber small bowel loops. Electronically Signed   By: Ulyses Southward M.D.   On: 12/06/2017 12:19   Mr Abdomen Mrcp Vivien Rossetti Contast  Result Date: 12/02/2017 CLINICAL DATA:  Upper abdominal pain. Abnormal liver function tests. Dilated common bile duct on ultrasound. Acute pancreatitis on CT scan. EXAM: MRI ABDOMEN WITHOUT AND WITH CONTRAST (INCLUDING MRCP) TECHNIQUE: Multiplanar multisequence MR imaging  of the abdomen was performed both before and after the administration of intravenous contrast. Heavily T2-weighted images of the biliary and pancreatic ducts were obtained, and three-dimensional MRCP images were rendered by post processing. CONTRAST:  18mL MULTIHANCE GADOBENATE DIMEGLUMINE 529 MG/ML IV SOLN COMPARISON:  12/02/2017 CT scan FINDINGS: Despite efforts by the technologist and patient, motion artifact is present on today's exam and could not be eliminated. This is a common result when MRCP is attempted in the inpatient setting where patients are less likely to be able to breath hold and cooperate in controlling motion. This reduces exam sensitivity and specificity. Lower chest: Stable cardiomegaly. Aortic valve prosthesis. Mild atelectasis in the posterior basal segments of both lower lobes. Hepatobiliary: Cholelithiasis is observed. On images 45 through 50 of series 5, there is low signal intensity filling defect in the distal CBD compatible with small stones in the setting of  choledocholithiasis. This is a likely cause for the biliary dilatation and pancreatitis. There appear to be multiple clustered calculi in the 5 mm in less range in the vicinity of the ampulla. The filling defects are also shown on image 23/4 and images 29 through 31 of series 24. Mild extrahepatic biliary dilatation, with the common bile duct at about 9 mm. Pancreas: Peripancreatic edema compatible with acute pancreatitis. No abscess, pseudocyst, or findings of pancreatic necrosis. Spleen:  Unremarkable Adrenals/Urinary Tract:  Unremarkable Stomach/Bowel: Mild secondary inflammation of the duodenum. Vascular/Lymphatic:  Aortoiliac atherosclerotic vascular disease. Other: Small amount of ascites along the inferior right hepatic margin. Musculoskeletal: Unremarkable IMPRESSION: 1. Choledocholithiasis with several clustered small (less than 5 mm) stones in the distal CBD. These at the likely cause of the mild biliary dilatation and acute pancreatitis. 2. Peripancreatic edema compatible with acute pancreatitis. No findings of pancreatic abscess, pseudocyst, or pancreatic necrosis. 3. Stable cardiomegaly with aortic valve prosthesis. 4. Mild subsegmental atelectasis in the posterior basal segments of both lower lobes. 5. Gallstones in the gallbladder. 6.  Aortic Atherosclerosis (ICD10-I70.0). Electronically Signed   By: Gaylyn Rong M.D.   On: 12/02/2017 15:13   US Abdomen Limited Ruq  Result Date: 12/02/2017 CLINICAL DATA:  Acute cholelithiasis, pancreatitis. EXAM: ULTRASOUND ABDOMEN LIMITED RIGHT UPPER QUADRANT COMPARISON:  CT of the abdomen and pelvis from the same day. FINDINGS: Gallbladder: Sludge is present within the gallbladder. No visible stones are present. Wall is thickened, measuring up to 7 mm. There is no sonographic Murphy sign. Common bile duct: Diameter: The common bile duct is dilated measuring up to 11.6 mm. Liver: Liver is diffusely echogenic. No discrete lesions are present. Portal vein is  patent on color Doppler imaging with normal direction of blood flow towards the liver. IMPRESSION: 1. Dilated common bile duct likely associated with the acute pancreatitis. 2. Gallbladder wall thickening and sludge may also be associated with acute pancreatitis. 3. No gallstones or sonographic Murphy sign to suggest primary gallbladder pathology. Electronically Signed   By: Marin Roberts M.D.   On: 12/02/2017 11:27    Lab Data:  CBC: Recent Labs  Lab 12/06/17 0730 12/07/17 0551 12/08/17 0534 12/10/17 1148 12/11/17 0536  WBC 11.8* 10.0 8.4 8.2 8.5  HGB 10.8* 10.3* 10.5* 10.0* 10.6*  HCT 34.0* 32.0* 33.2* 30.8* 32.2*  MCV 107.9* 107.7* 108.5* 106.2* 103.5*  PLT 238 226 250 254 266   Basic Metabolic Panel: Recent Labs  Lab 12/07/17 0551 12/07/17 1341 12/08/17 0534 12/09/17 0436 12/10/17 1148 12/11/17 0536  NA 142  --  142 144 143 138  K 4.6  --  4.5 4.5 4.3 3.8  CL 109  --  108 111 110 106  CO2 21*  --  22 23 20* 24  GLUCOSE 84  --  88 73 56* 166*  BUN 23*  --  23* 22* 17 10  CREATININE 0.97  --  0.96 0.91 1.10 0.80  CALCIUM 8.8*  --  8.9 9.2 8.9 8.7*  MG  --  1.9 2.0 1.6* 1.3* 1.1*  PHOS  --  2.5 3.0 3.7  --  2.6   GFR: Estimated Creatinine Clearance: 95.3 mL/min (by C-G formula based on SCr of 0.8 mg/dL). Liver Function Tests: Recent Labs  Lab 12/05/17 0617 12/06/17 0730 12/07/17 0551 12/10/17 1148 12/11/17 0536  AST 96*  95* 44* 26 21 20   ALT 78*  76* 61 41 23 21  ALKPHOS 159*  158* 141* 113 98 89  BILITOT 2.0*  2.1* 1.7* 1.7* 1.6* 1.4*  PROT 6.9  6.8 7.2 6.6 5.9* 5.6*  ALBUMIN 2.9*  2.9* 2.9* 2.6* 2.3* 2.2*   Recent Labs  Lab 12/06/17 0730 12/10/17 1148  LIPASE 79* 57*   No results for input(s): AMMONIA in the last 168 hours. Coagulation Profile: Recent Labs  Lab 12/10/17 1148 12/11/17 0536  INR 1.34 1.42   Cardiac Enzymes: No results for input(s): CKTOTAL, CKMB, CKMBINDEX, TROPONINI in the last 168 hours. BNP (last 3 results) No  results for input(s): PROBNP in the last 8760 hours. HbA1C: No results for input(s): HGBA1C in the last 72 hours. CBG: No results for input(s): GLUCAP in the last 168 hours. Lipid Profile: No results for input(s): CHOL, HDL, LDLCALC, TRIG, CHOLHDL, LDLDIRECT in the last 72 hours. Thyroid Function Tests: No results for input(s): TSH, T4TOTAL, FREET4, T3FREE, THYROIDAB in the last 72 hours. Anemia Panel: No results for input(s): VITAMINB12, FOLATE, FERRITIN, TIBC, IRON, RETICCTPCT in the last 72 hours. Urine analysis:    Component Value Date/Time   COLORURINE YELLOW 12/02/2017 2335   APPEARANCEUR CLEAR 12/02/2017 2335   LABSPEC 1.036 (H) 12/02/2017 2335   PHURINE 5.0 12/02/2017 2335   GLUCOSEU NEGATIVE 12/02/2017 2335   HGBUR SMALL (A) 12/02/2017 2335   BILIRUBINUR NEGATIVE 12/02/2017 2335   KETONESUR NEGATIVE 12/02/2017 2335   PROTEINUR NEGATIVE 12/02/2017 2335   UROBILINOGEN 1.0 07/14/2012 1243   NITRITE NEGATIVE 12/02/2017 2335   LEUKOCYTESUR NEGATIVE 12/02/2017 2335     Ripudeep Rai M.D. [facility] 12/11/2017, 1:13 PM  Pager: [phone] Between 7am to 7pm - call Pager - [phone]  After 7pm go to [email or web address] - password [email or web address]  Call night coverage person covering after 7pm

## 2017-12-11 NOTE — Op Note (Signed)
PATIENT:  Tyler CuffAlbert J Galloway  72 y.o. male  PRE-OPERATIVE DIAGNOSIS:  CHOLELITHIASIS  POST-OPERATIVE DIAGNOSIS:  CHOLELITHIASIS  PROCEDURE:  Procedure(s): LAPAROSCOPIC CHOLECYSTECTOMY WITH INTRAOPERATIVE CHOLANGIOGRAM   SURGEON:  Surgeon(s): Nilton Lave, De BlanchLuke Aaron, MD  ASSISTANT:   ANESTHESIA:   local and general  Indications for procedure: [name] is a 72 y.o. male with symptoms of Abdominal pain and Nausea and vomiting consistent with gallbladder disease, Confirmed by Ultrasound and CT. He had a prolonged hospital course due to pancreatitis. He is now improving and ready to proceed with laparoscopic cholecystectomy.  Description of procedure: The patient was brought into the operative suite, placed supine. Anesthesia was administered with endotracheal tube. Patient was strapped in place and foot board was secured. All pressure points were offloaded by foam padding. The patient was prepped and draped in the usual sterile fashion.  A small incision was made to the right subcostal area. A 5mm trocar was inserted into the peritoneal cavity with optical entry. Pneumoperitoneum was applied with high flow low pressure. 1 5mm trocar was placed in the RUQ and one in the supraumbilical space. A 12mm trocar was placed in the subxiphoid space. All trocars sites were first anesthesized with marcaine with epinephrine in the subcutaneous and preperitoneal layers. Next the patient was placed in reverse trendelenberg. The gallbladder appeared inflamed and edematous  The gallbladder was retracted cephalad and lateral. The peritoneum was reflected off the infundibulum working lateral to medial. "The cystic duct and cystic artery were identified and further dissection revealed a critical view, due to concern for choledocholithiasis a cholangiogram was performed with ductotomy and cook catheter passed through a separate subcostal stab incision. Normal ductal anatomy was seen. The cystic duct and cystic artery  were doubly clipped and ligated.   The gallbladder was removed off the liver bed with cautery. The Gallbladder was placed in a specimen bag. The gallbladder fossa was irrigated and hemostasis was applied with cautery. The gallbladder was removed via the 12mm trocar. The fascial defect was closed with interrupted 0 vicryl suture via laparoscopic trans-fascial suture passer. Pneumoperitoneum was removed, all trocar were removed. All incisions were closed with 4-0 monocryl subcuticular stitch. The patient woke from anesthesia and was brought to PACU in stable condition. All counts were correct  Findings: normal ductal anatomy, edematous gallbladder  Specimen: gallbladder  Blood loss: Total I/O In: 150 [IV Piggyback:150] Out: -  ml  Local anesthesia: 20 ml marcaine  Complications: none  PLAN OF CARE: Admit to inpatient   PATIENT DISPOSITION:  PACU - hemodynamically stable.  Feliciana RossettiLuke Amauris Debois, M.D. General, Bariatric, & Minimally Invasive Surgery [facility]

## 2017-12-12 ENCOUNTER — Encounter (HOSPITAL_COMMUNITY): Payer: Self-pay | Admitting: Gastroenterology

## 2017-12-12 LAB — CBC
HCT: 31.5 % — ABNORMAL LOW (ref 39.0–52.0)
Hemoglobin: 10.5 g/dL — ABNORMAL LOW (ref 13.0–17.0)
MCH: 35 pg — AB (ref 26.0–34.0)
MCHC: 33.3 g/dL (ref 30.0–36.0)
MCV: 105 fL — AB (ref 78.0–100.0)
PLATELETS: 277 10*3/uL (ref 150–400)
RBC: 3 MIL/uL — ABNORMAL LOW (ref 4.22–5.81)
RDW: 13.3 % (ref 11.5–15.5)
WBC: 10.1 10*3/uL (ref 4.0–10.5)

## 2017-12-12 LAB — BASIC METABOLIC PANEL
Anion gap: 7 (ref 5–15)
BUN: 8 mg/dL (ref 6–20)
CALCIUM: 8.7 mg/dL — AB (ref 8.9–10.3)
CO2: 26 mmol/L (ref 22–32)
Chloride: 101 mmol/L (ref 101–111)
Creatinine, Ser: 0.88 mg/dL (ref 0.61–1.24)
GFR calc Af Amer: >60 mL/min (ref >60)
GLUCOSE: 177 mg/dL — AB (ref 65–99)
Potassium: 5.2 mmol/L — ABNORMAL HIGH (ref 3.5–5.1)
SODIUM: 134 mmol/L — AB (ref 135–145)

## 2017-12-12 LAB — MAGNESIUM: Magnesium: 1.3 mg/dL — ABNORMAL LOW (ref 1.7–2.4)

## 2017-12-12 LAB — HEPATIC FUNCTION PANEL
ALT: 43 U/L (ref 17–63)
AST: 71 U/L — ABNORMAL HIGH (ref 15–41)
Albumin: 2.1 g/dL — ABNORMAL LOW (ref 3.5–5.0)
Alkaline Phosphatase: 93 U/L (ref 38–126)
BILIRUBIN DIRECT: 0.3 mg/dL (ref 0.1–0.5)
BILIRUBIN INDIRECT: 0.8 mg/dL (ref 0.3–0.9)
TOTAL PROTEIN: 5.6 g/dL — AB (ref 6.5–8.1)
Total Bilirubin: 1.1 mg/dL (ref 0.3–1.2)

## 2017-12-12 LAB — PHOSPHORUS: PHOSPHORUS: 3.5 mg/dL (ref 2.5–4.6)

## 2017-12-12 MED ORDER — MAGNESIUM SULFATE 50 % IJ SOLN
2.0000 g | Freq: Once | INTRAVENOUS | Status: AC
  Administered 2017-12-12: 2 g via INTRAVENOUS
  Filled 2017-12-12: qty 4

## 2017-12-12 MED ORDER — MORPHINE SULFATE (PF) 4 MG/ML IV SOLN
2.0000 mg | INTRAVENOUS | Status: DC | PRN
  Filled 2017-12-12: qty 1

## 2017-12-12 MED ORDER — SODIUM POLYSTYRENE SULFONATE 15 GM/60ML PO SUSP
30.0000 g | Freq: Once | ORAL | Status: AC
  Administered 2017-12-12: 30 g via ORAL
  Filled 2017-12-12: qty 120

## 2017-12-12 MED ORDER — MORPHINE SULFATE (PF) 4 MG/ML IV SOLN
2.0000 mg | INTRAVENOUS | Status: DC | PRN
  Administered 2017-12-12 (×2): 4 mg via INTRAVENOUS
  Filled 2017-12-12: qty 1

## 2017-12-12 MED ORDER — MAGNESIUM SULFATE 4 GM/100ML IV SOLN
4.0000 g | Freq: Once | INTRAVENOUS | Status: AC
  Administered 2017-12-12: 4 g via INTRAVENOUS
  Filled 2017-12-12: qty 100

## 2017-12-12 MED ORDER — MAGNESIUM OXIDE 400 (241.3 MG) MG PO TABS
400.0000 mg | ORAL_TABLET | Freq: Two times a day (BID) | ORAL | Status: DC
  Administered 2017-12-13: 400 mg via ORAL
  Filled 2017-12-12: qty 1

## 2017-12-12 MED ORDER — IPRATROPIUM-ALBUTEROL 0.5-2.5 (3) MG/3ML IN SOLN: 3.0000 mL | Freq: Four times a day (QID) | RESPIRATORY_TRACT | Status: DC | PRN

## 2017-12-12 MED ORDER — METOPROLOL SUCCINATE ER 50 MG PO TB24
50.0000 mg | ORAL_TABLET | Freq: Two times a day (BID) | ORAL | Status: DC
  Administered 2017-12-12 – 2017-12-13 (×2): 50 mg via ORAL
  Filled 2017-12-12 (×2): qty 1

## 2017-12-12 MED ORDER — WARFARIN - PHARMACIST DOSING INPATIENT: Freq: Every day | Status: DC

## 2017-12-12 MED ORDER — WARFARIN SODIUM 5 MG PO TABS
10.0000 mg | ORAL_TABLET | Freq: Once | ORAL | Status: AC
  Administered 2017-12-12: 10 mg via ORAL
  Filled 2017-12-12: qty 2

## 2017-12-12 MED ORDER — DEXTROSE-NACL 5-0.45 % IV SOLN
INTRAVENOUS | Status: DC
  Administered 2017-12-12 – 2017-12-13 (×2): via INTRAVENOUS

## 2017-12-12 NOTE — Progress Notes (Signed)
[facility]/Trauma Progress Note  1 Day Post-Op   Assessment/Plan AFib CAD/Hx of MI in 2011 Aortic stenosis- s/p valve replacement HTN COPD Diverticulosis Gout  Gallstone Pancreatitis - CT 1/28: changes of acute pancreatitis involving the head and the body of the pancreas, gallstones; colonic ileus - lipase 57 on 1/30 - Tbili 1.1, AST slightly elevated at 71, ALT WNL - S/P lap chole, Dr. Sheliah Hatch, 01/31 Colonic Ileus- s/p decompression by GI - CT as above- repeat KUB showed normal bowel pattern - +stool and flatus - mobilize as tolerated Hypomagnesemia- Mg 1.3, replace per primary  FEN: clears and advance diet as tolerated VTE: SCDs, heparin ID: none currently  Dispo: Pt doing well post op. Advance diet as tolerated today and pt should be ready for discharge tomorrow from a surgical standpoint   LOS: 10 days    Subjective: CC: mild abdominal pain  Pain is different and improved since surgery. Having flatus. No nausea, vomiting, fever or chills overnight. Has not eaten. Has been up to urinate and walking around.  Wife at bedside.   Objective: Vital signs in last 24 hours: Temp:  [97.3 F (36.3 C)-98.5 F (36.9 C)] 97.9 F (36.6 C) (02/01 0618) Pulse Rate:  [89-101] 90 (02/01 0618) Resp:  [16-17] 17 (02/01 0618) BP: (128-139)/(79-92) 128/79 (02/01 0618) SpO2:  [95 %-100 %] 97 % (02/01 0618) Last BM Date: 12/11/17  Intake/Output from previous day: 01/31 0701 - 02/01 0700 In: 1878.3 [P.O.:400; I.V.:1328.3; IV Piggyback:150] Out: 25 [Blood:25] Intake/Output this shift: No intake/output data recorded.  PE: Gen:  Alert, NAD, pleasant, cooperative Card:  Irregularly irregular, rate normal, no M/G/R heard Pulm:  rate and effort normal Abd: Soft, not distended, +BS, incisions with glue intact and without drainage, appropriately tender Skin: no rashes noted, warm and dry  Anti-infectives: Anti-infectives (From admission, onward)   Start      Dose/Rate Route Frequency Ordered Stop   12/11/17 0800  cefTRIAXone (ROCEPHIN) 2 g in dextrose 5 % 50 mL IVPB     2 g 100 mL/hr over 30 Minutes Intravenous  Once 12/10/17 1459 12/11/17 1445   12/08/17 1800  metroNIDAZOLE (FLAGYL) IVPB 500 mg  Status:  Discontinued     500 mg 100 mL/hr over 60 Minutes Intravenous Every 8 hours 12/08/17 1633 12/11/17 1709   12/08/17 0600  cefoTEtan (CEFOTAN) 2 g in dextrose 5 % 50 mL IVPB  Status:  Discontinued     2 g 100 mL/hr over 30 Minutes Intravenous On call to O.R. 12/08/17 0009 12/08/17 1516   12/05/17 0600  ceFAZolin (ANCEF) IVPB 2g/100 mL premix     2 g 200 mL/hr over 30 Minutes Intravenous On call to O.R. 12/04/17 1455 12/06/17 0559   12/02/17 1000  piperacillin-tazobactam (ZOSYN) IVPB 3.375 g  Status:  Discontinued     3.375 g 12.5 mL/hr over 240 Minutes Intravenous Every 8 hours 12/02/17 0947 12/08/17 1440   12/02/17 0945  piperacillin-tazobactam (ZOSYN) IVPB 3.375 g  Status:  Discontinued     3.375 g 100 mL/hr over 30 Minutes Intravenous Every 8 hours 12/02/17 0941 12/02/17 0946      Lab Results:  Recent Labs    12/11/17 0536 12/12/17 0506  WBC 8.5 10.1  HGB 10.6* 10.5*  HCT 32.2* 31.5*  PLT 266 277   BMET Recent Labs    12/11/17 0536 12/12/17 0506  NA 138 134*  K 3.8 5.2*  CL 106 101  CO2 24 26  GLUCOSE 166* 177*  BUN 10  8  CREATININE 0.80 0.88  CALCIUM 8.7* 8.7*   PT/INR Recent Labs    12/10/17 1148 12/11/17 0536  LABPROT 16.5* 17.3*  INR 1.34 1.42   CMP     Component Value Date/Time   NA 134 (L) 12/12/2017 0506   K 5.2 (H) 12/12/2017 0506   CL 101 12/12/2017 0506   CO2 26 12/12/2017 0506   GLUCOSE 177 (H) 12/12/2017 0506   BUN 8 12/12/2017 0506   CREATININE 0.88 12/12/2017 0506   CREATININE 0.85 07/29/2016 1611   CALCIUM 8.7 (L) 12/12/2017 0506   PROT 5.6 (L) 12/12/2017 0506   ALBUMIN 2.1 (L) 12/12/2017 0506   AST 71 (H) 12/12/2017 0506   ALT 43 12/12/2017 0506   ALKPHOS 93 12/12/2017 0506    BILITOT 1.1 12/12/2017 0506   GFRNONAA >60 12/12/2017 0506   GFRAA >60 12/12/2017 0506   Lipase     Component Value Date/Time   LIPASE 57 (H) 12/10/2017 1148    Studies/Results: Dg Cholangiogram Operative  Result Date: 12/11/2017 CLINICAL DATA:  72 year old male with a history of cholelithiasis EXAM: INTRAOPERATIVE CHOLANGIOGRAM TECHNIQUE: Cholangiographic images from the C-arm fluoroscopic device were submitted for interpretation post-operatively. Please see the procedural report for the amount of contrast and the fluoroscopy time utilized. COMPARISON:  None. FINDINGS: Surgical instruments project over the upper abdomen. There is cannulation of the cystic duct/gallbladder neck, with antegrade infusion of contrast. Caliber of the extrahepatic ductal system within normal limits. No definite filling defect within the extrahepatic ducts identified. Free flow of contrast across the ampulla. IMPRESSION: Intraoperative cholangiogram demonstrates extrahepatic biliary ducts of unremarkable caliber, with no definite filling defects identified. Free flow of contrast across the ampulla. Please refer to the dictated operative report for full details of intraoperative findings and procedure Electronically Signed   By: Gilmer MorJaime  Wagner D.O.   On: 12/11/2017 15:51   Dg Abd Portable 1v  Result Date: 12/10/2017 CLINICAL DATA:  72 year old male with abdominal pain. Pancreatitis. Large bowel distention on CT 2 days ago favored due to colonic ileus. EXAM: PORTABLE ABDOMEN - 1 VIEW COMPARISON:  CT Abdomen and Pelvis 12/08/2017 and earlier. FINDINGS: Portable AP supine view at 1332 hrs. Stable enteric tube with side hole up the level of the proximal gastric body. Resolved gas distended large bowel loops. Similar gas-filled nondilated small bowel. The gas pattern now is nonobstructed. No definite pneumoperitoneum on this supine view. No acute osseous abnormality identified. IMPRESSION: Resolved gas distended colon and  essentially normalized bowel-gas pattern since 12/08/2017. Electronically Signed   By: Odessa FlemingH  Hall M.D.   On: 12/10/2017 14:08      Jerre SimonJessica L Quita Mcgrory , [facility] 12/12/2017, 8:28 AM Pager: [phone] Consults: [phone] Mon-Fri 7:00 am-4:30 pm Sat-Sun 7:00 am-11:30 am

## 2017-12-12 NOTE — Anesthesia Postprocedure Evaluation (Signed)
Anesthesia Post Note  Patient: [name]  Procedure(s) Performed: LAPAROSCOPIC CHOLECYSTECTOMY WITH INTRAOPERATIVE CHOLANGIOGRAM (N/A Abdomen)     Patient location during evaluation: PACU Anesthesia Type: General Level of consciousness: awake and alert Pain management: pain level controlled Vital Signs Assessment: post-procedure vital signs reviewed and stable Respiratory status: spontaneous breathing, nonlabored ventilation, respiratory function stable and patient connected to nasal cannula oxygen Cardiovascular status: blood pressure returned to baseline and stable Postop Assessment: no apparent nausea or vomiting Anesthetic complications: no    Last Vitals:  Vitals:   12/12/17 0618 12/12/17 0851  BP: 128/79   Pulse: 90   Resp: 17   Temp: 36.6 C   SpO2: 97% 96%    Last Pain:  Vitals:   12/12/17 0618  TempSrc: Oral  PainSc:                  Jiles GarterJACKSON,Kamari Buch EDWARD

## 2017-12-12 NOTE — Progress Notes (Signed)
ANTICOAGULATION CONSULT NOTE - Initial Consult  Pharmacy Consult for Coumadin Indication: atrial fibrillation  Allergies  Allergen Reactions  . Diclofenac Itching  . Naproxen     itching    Patient Measurements: Height: 5\' 10"  (177.8 cm) Weight: 203 lb 7.8 oz (92.3 kg) IBW/kg (Calculated) : 73  Assessment: 72 yo M on Coumadin 10mg  daily exc 5mg  on Mon/Fri PTA for Afib. INR on admit was 2.14. Held for procedure. Down to 1.42 on 1/30. Now to restart Coumadin s/p lap chole. Hgb 10.5, plts wnl.  Goal of Therapy:  INR 2-3 Monitor platelets by anticoagulation protocol: Yes   Plan:  Give Coumadin 10mg  PO x 1 tonight Monitor daily INR, CBC, s/s of bleed Consider need to bridge?  [name] J 12/12/2017,10:46 AM

## 2017-12-12 NOTE — Care Management Important Message (Signed)
Important Message  Patient Details  Name: [name] MRN: [id number] Date of Birth: 01-19-1946   Medicare Important Message Given:  Yes    Lawerance Sabalebbie Rejina Odle, RN 12/12/2017, 10:55 AM

## 2017-12-12 NOTE — Progress Notes (Signed)
[facility]                                                                              Patient Demographics  [name], is a 72 y.o. male, DOB - 1946-11-04, UEA:[id number]  Admit date - 12/02/2017   Admitting Physician Catarina Hartshorn, MD  Outpatient Primary MD for the patient is Benita Stabile, MD  Outpatient specialists:   LOS - 10  days   Medical records reviewed and are as summarized below:    Chief Complaint  Patient presents with  . Abdominal Pain       Brief summary   Patient is a 72 year old male with history of chronic atrial fibrillation status post maze, failed DC CV, CAD, aortic stenosis status post bioprosthetic AVR, hyperlipidemia, hypertension presented with abdominal pain, nausea and vomiting on 1/22.  He was found to have lipase over 3000 with elevated LFTs. CT of the abdomen and pelvis showed cholelithiasis with gallbladder wall thickening with intra and extrahepatic ductal dilatation as well as common bile duct dilatation.    Patient was admitted for further workup. Patient was seen by GI, surgery and cardiology (for preop clearance).  Patient's procedures were initially delayed due to iatrogenic coagulopathy (Coumadin for A. Fib).  Underwent ERCP on 1/24 however during ERCP, had A. fib with RVR with heart rate into 190s.  Cholecystectomy was canceled.  On 1/28, patient had marked distention of his colon up to 17 cm on x-ray with concern for possible volvulus.  CT abdomen was negative, patient underwent colonoscopic decompression by Dr. Darrick Penna.  Antibiotics were restarted due to concern for mild ischemic colitis.  Family requested transfer to [facility].   Assessment & Plan    Principal Problem:   Gallstone pancreatitis, choledocholithiasis -CT of the abdomen at the time of admission showed cholelithiasis with gallbladder wall thickening, intra-and extrahepatic ductal dilatation, CBD dilatation, lipase> 3000 -ERCP 1/24 showed choledocholithiasis  status post successful endoscopic sphincterotomy, balloon dilatation and stone extraction. -Status post laparoscopic cholecystectomy, postop day #1 -Patient doing well, diet advanced to full liquids, soft solids in a.m.  Active Problems:  Colonic ileus -Patient underwent colonoscopic decompression on 1/28 at [facility] -Continue IV fluids, Flagyl -Improving, had a BM  Hypomagnesemia -Magnesium still low, 1.3, will replace IV, start oral replacement from tomorrow.    Hyperkalemia -Potassium 5.2, Kayexalate x1    Atrial fibrillation, chronic (HCC)  -Currently rate controlled, DC IV metoprolol, started on Toprol XL per outpatient dose - will restart Coumadin today  History of COPD -Wheezing improved   Code Status: Full code DVT Prophylaxis: IV heparin Family Communication: Discussed in detail with the patient, all imaging results, lab results explained to the patient  Disposition Plan: likely DC home in a.m. if tolerated solid diet  Time Spent in minutes 25 minutes  Procedures:    Consultants:   General surgery Patient followed by gastroenterology, surgery, cardiology at [facility]  Antimicrobials:   IV Flagyl   Medications  Scheduled Meds: . bisacodyl  5 mg Oral QPM  . feeding supplement  1 Container Oral TID BM  . ipratropium-albuterol  3 mL Nebulization TID  .  metoprolol tartrate  5 mg Intravenous Q6H  . pantoprazole (PROTONIX) IV  40 mg Intravenous QHS  . warfarin  10 mg Oral ONCE-1800  . Warfarin - Pharmacist Dosing Inpatient   Does not apply q1800   Continuous Infusions: . dextrose 5 % and 0.45 % NaCl with KCl 20 mEq/L 100 mL/hr at 12/12/17 1221  . lactated ringers 10 mL/hr at 12/11/17 1400  . magnesium sulfate 1 - 4 g bolus IVPB     PRN Meds:.acetaminophen **OR** acetaminophen, morphine injection, oxyCODONE, promethazine   Antibiotics   Anti-infectives (From admission, onward)   Start     Dose/Rate Route Frequency Ordered Stop   12/11/17 0800   cefTRIAXone (ROCEPHIN) 2 g in dextrose 5 % 50 mL IVPB     2 g 100 mL/hr over 30 Minutes Intravenous  Once 12/10/17 1459 12/11/17 1445   12/08/17 1800  metroNIDAZOLE (FLAGYL) IVPB 500 mg  Status:  Discontinued     500 mg 100 mL/hr over 60 Minutes Intravenous Every 8 hours 12/08/17 1633 12/11/17 1709   12/08/17 0600  cefoTEtan (CEFOTAN) 2 g in dextrose 5 % 50 mL IVPB  Status:  Discontinued     2 g 100 mL/hr over 30 Minutes Intravenous On call to O.R. 12/08/17 0009 12/08/17 1516   12/05/17 0600  ceFAZolin (ANCEF) IVPB 2g/100 mL premix     2 g 200 mL/hr over 30 Minutes Intravenous On call to O.R. 12/04/17 1455 12/06/17 0559   12/02/17 1000  piperacillin-tazobactam (ZOSYN) IVPB 3.375 g  Status:  Discontinued     3.375 g 12.5 mL/hr over 240 Minutes Intravenous Every 8 hours 12/02/17 0947 12/08/17 1440   12/02/17 0945  piperacillin-tazobactam (ZOSYN) IVPB 3.375 g  Status:  Discontinued     3.375 g 100 mL/hr over 30 Minutes Intravenous Every 8 hours 12/02/17 0941 12/02/17 0946        Subjective:   [name] was seen and examined today.  Feels a lot better today, sitting up in the chair, tolerating clear liquid diet.  Passing flatus.  No nausea vomiting.  Patient denies dizziness, chest pain, shortness of breath, new weakness, numbess, tingling.    Objective:   Vitals:   12/11/17 2201 12/12/17 0210 12/12/17 0618 12/12/17 0851  BP: 130/79 129/88 128/79   Pulse: 100 95 90   Resp: 16 17 17    Temp: 98.5 F (36.9 C) 97.9 F (36.6 C) 97.9 F (36.6 C)   TempSrc: Oral Oral Oral   SpO2:  96% 97% 96%  Weight:      Height:        Intake/Output Summary (Last 24 hours) at 12/12/2017 1427 Last data filed at 12/12/2017 [id number] Gross per 24 hour  Intake 1160 ml  Output 25 ml  Net 1135 ml     Wt Readings from Last 3 Encounters:  12/09/17 92.3 kg (203 lb 7.8 oz)  07/24/17 87.5 kg (193 lb)  07/29/16 85.3 kg (188 lb)     Exam   General: Alert and oriented x 3, NAD  Eyes:  HEENT:     Cardiovascular: Irregularly regular  No pedal edema b/l  Respiratory: Clear to auscultation bilaterally  Gastrointestinal: Soft, nontender, incision CDI, + bowel sounds  Ext: no pedal edema bilaterally  Neuro: no neuro deficits   Musculoskeletal: No digital cyanosis, clubbing  Skin: No rashes  Psych: Normal affect and demeanor, alert and oriented x3     Data Reviewed:  I have personally reviewed following labs and imaging studies  Micro  Results Recent Results (from the past 240 hour(s))  Surgical pcr screen     Status: None   Collection Time: 12/06/17 10:01 AM  Result Value Ref Range Status   MRSA, PCR NEGATIVE NEGATIVE Final   Staphylococcus aureus NEGATIVE NEGATIVE Final    Comment: (NOTE) The Xpert SA Assay (FDA approved for NASAL specimens in patients 53 years of age and older), is one component of a comprehensive surveillance program. It is not intended to diagnose infection nor to guide or monitor treatment.   Surgical PCR screen     Status: None   Collection Time: 12/10/17  6:30 PM  Result Value Ref Range Status   MRSA, PCR NEGATIVE NEGATIVE Final   Staphylococcus aureus NEGATIVE NEGATIVE Final    Comment: (NOTE) The Xpert SA Assay (FDA approved for NASAL specimens in patients 78 years of age and older), is one component of a comprehensive surveillance program. It is not intended to diagnose infection nor to guide or monitor treatment.     Radiology Reports Dg Chest 1 View  Result Date: 12/06/2017 CLINICAL DATA:  Nasogastric tube placement EXAM: CHEST 1 VIEW COMPARISON:  4 days prior FINDINGS: Nasogastric tube tip reaches the stomach with side port at the GE junction. Low lung volumes with bandlike opacity at the left base. Cardiomegaly. History of aortic valve replacement. No Kerley lines, effusion, or pneumothorax. IMPRESSION: 1. Nasogastric tube with tip at the proximal stomach. 2. Atelectasis at the left base. Electronically Signed   By: Marnee Spring M.D.   On: 12/06/2017 15:03   Dg Abd 1 View  Result Date: 12/08/2017 CLINICAL DATA:  Ileus. EXAM: ABDOMEN - 1 VIEW COMPARISON:  Radiograph of December 07, 2017. FINDINGS: Nasogastric tube tip is seen in expected position of distal stomach. No small bowel dilatation is noted. Distention of colon is again noted measuring 18 cm, which is not significantly changed compared to prior exam. IMPRESSION: Grossly stable colonic dilatation as described above. Electronically Signed   By: Lupita Raider, M.D.   On: 12/08/2017 10:24   Dg Abd 1 View  Result Date: 12/07/2017 CLINICAL DATA:  Patient with history of small bowel obstruction. Abdominal tightness. EXAM: ABDOMEN - 1 VIEW COMPARISON:  Abdominal radiograph 12/06/2017. FINDINGS: Gas is demonstrated within the small bowel throughout the abdomen. Enteric tube tip and side-port project over the stomach. Interval increase in gaseous distention of the colon measuring up to 17 cm. Supine evaluation limited for the detection of free intraperitoneal air. Lumbar spine degenerative changes. IMPRESSION: Marked gaseous distention of the colon measuring up to 17 cm, increased from prior. Electronically Signed   By: Annia Belt M.D.   On: 12/07/2017 14:48   Dg Cholangiogram Operative  Result Date: 12/11/2017 CLINICAL DATA:  72 year old male with a history of cholelithiasis EXAM: INTRAOPERATIVE CHOLANGIOGRAM TECHNIQUE: Cholangiographic images from the C-arm fluoroscopic device were submitted for interpretation post-operatively. Please see the procedural report for the amount of contrast and the fluoroscopy time utilized. COMPARISON:  None. FINDINGS: Surgical instruments project over the upper abdomen. There is cannulation of the cystic duct/gallbladder neck, with antegrade infusion of contrast. Caliber of the extrahepatic ductal system within normal limits. No definite filling defect within the extrahepatic ducts identified. Free flow of contrast across the ampulla.  IMPRESSION: Intraoperative cholangiogram demonstrates extrahepatic biliary ducts of unremarkable caliber, with no definite filling defects identified. Free flow of contrast across the ampulla. Please refer to the dictated operative report for full details of intraoperative findings and procedure Electronically Signed  By: Gilmer Mor D.O.   On: 12/11/2017 15:51   Ct Abdomen Pelvis W Contrast  Result Date: 12/08/2017 CLINICAL DATA:  Abdominal pain and distention. Evaluate for volvulus. EXAM: CT ABDOMEN AND PELVIS WITH CONTRAST TECHNIQUE: Multidetector CT imaging of the abdomen and pelvis was performed using the standard protocol following bolus administration of intravenous contrast. CONTRAST:  ISOVUE-300 IOPAMIDOL (ISOVUE-300) INJECTION 61% COMPARISON:  12/02/2017 FINDINGS: Lower chest: Dependent changes are noted in both lung bases. Small right pleural effusion. Hepatobiliary: No suspicious liver abnormality. Tiny stones noted within the gallbladder. Pancreas: There is diffuse edema and inflammation involving the pancreas. Most severe at the head through body compatible acute pancreatitis. There is surrounding fat stranding identified. No pseudocyst identified. Spleen: Normal in size without focal abnormality. Adrenals/Urinary Tract: The adrenal glands are normal. The right kidney is normal. Tiny cyst arises from the upper pole the left kidney. No hydronephrosis. Urinary bladder appears normal. Stomach/Bowel: Enteric tube is identified within the stomach. The small bowel loops have a normal course and caliber. There is a large mobile cecum which extends across the midline to the left upper quadrant of the abdomen. The ileocecal valve is identified within the left abdomen, image 37 of series 3. The distended cecum measures up to 10.4 cm and has air-contrast level. Air-filled loop of transverse colon is identified. Transition to normal to decreased caliber: Is identified at the level of the splenic  flexure. Distal colonic diverticula noted without acute inflammation. Vascular/Lymphatic: Aortic atherosclerosis. No aneurysm. No upper abdominal or pelvic adenopathy. Reproductive: Prostate is unremarkable. Other: Trace ascites identified within the upper abdomen. No focal fluid collections. Musculoskeletal: There is degenerative disc disease identified throughout the lumbar spine. No suspicious bone lesions. IMPRESSION: 1. Changes of acute pancreatitis involving the head through body of pancreas noted. No evidence for pseudocyst formation. Tiny gallstones are identified within the gallbladder. No definite stones within the common bile duct. 2. Abnormal distension of the cecum through the transverse colon to the level of the splenic flexure. No evidence for cecal volvulus. Favor colonic ileus. 3.  Aortic Atherosclerosis (ICD10-I70.0). 4. Small right effusion. Dependent changes noted within both lung bases. Electronically Signed   By: Signa Kell M.D.   On: 12/08/2017 12:46   Ct Abdomen Pelvis W Contrast  Result Date: 12/02/2017 CLINICAL DATA:  Altered bowel habits. Treatment for diarrhea has resulted in no bowel movement for 2 days. Increasing abdominal pain. EXAM: CT ABDOMEN AND PELVIS WITH CONTRAST TECHNIQUE: Multidetector CT imaging of the abdomen and pelvis was performed using the standard protocol following bolus administration of intravenous contrast. CONTRAST:  ISOVUE-300 IOPAMIDOL (ISOVUE-300) INJECTION 61% COMPARISON:  Multiple exams, including CT scan from 07/23/2012 FINDINGS: Lower chest: Moderate cardiomegaly. Aortic valve prosthesis. Posterior wall calcifications in the left atrium and to a lesser extent in the right atrium. Thinning of the left ventricular myocardium. Mild atelectasis in the lingula and mild dependent atelectasis in both lower lobes. Circumflex coronary artery atherosclerotic calcification. Hepatobiliary: Mild intrahepatic biliary dilatation. Common hepatic duct 1.6 cm,  formerly 0.8 cm on 07/23/2012. Common bile duct 1.2 cm, formerly 0.8 cm. This CBD dilatation extends to the ampulla Dependent densities in the gallbladder are compatible with gallstones. Borderline wall thickening in portions of the gallbladder. Pancreas: Abnormal peripancreatic stranding diffusely without pseudocyst, abscess, or necrosis. Spleen: Unremarkable Adrenals/Urinary Tract: Stable fullness of the right adrenal gland. Left adrenal gland unremarkable. 0.7 by 1.4 cm hypodense lesion of the left kidney upper pole is likely a small cyst but  technically too small to characterize. Stomach/Bowel: Mild wall thickening and accentuated enhancement in the duodenal bulb, descending duodenum, and proximal transverse duodenum noted, secondary inflammation is favored over peptic ulcer disease. Several small diverticula of the transverse duodenum do not appear to be the epicenter of the regional inflammatory process. Descending and sigmoid colon diverticulosis without active diverticulitis. Scattered diverticula of the ascending colon noted. Appendix normal. Vascular/Lymphatic: Aortoiliac atherosclerotic vascular disease. Reproductive: Unremarkable Other: Small amount of free fluid adjacent to the descending duodenum and tracking in the right paracolic gutter. Small amount of central mesenteric edema. Musculoskeletal: Old healed right lower lateral rib fractures. Subacute healing fracture of the left anterior fifth and sixth ribs. Prior median sternotomy. Mildly exaggerated lumbar lordosis. Grade 1 degenerative anterolisthesis at L5-S1 with slightly transitional S1 level. Lumbar spondylosis and degenerative disc disease with mild impingement at L4-5 and L5-S1. IMPRESSION: 1. Acute pancreatitis with peripancreatic stranding and a small amount of ascites tracking in the right paracolic gutter. No findings of pancreatic abscess, necrosis, or pseudocyst. 2. Gallstones are present in the gallbladder and there is new  intrahepatic and extrahepatic biliary dilatation, raising the strong possibility of occult distal CBD stone as a potential cause for the pancreatitis and biliary dilatation. Minimal gallbladder wall thickening. 3. Subacute healing fractures of the left anterior fifth and sixth ribs. 4. Cardiomegaly with wall calcifications along the atria and thinning of the left ventricular myocardium. Coronary atherosclerosis. Scattered colonic diverticulosis diverticula of the transverse duodenum. There are also some. 5.  Aortic Atherosclerosis (ICD10-I70.0). 6. Mild lumbar impingement at L4-5 and L5-S1. Electronically Signed   By: Gaylyn Rong M.D.   On: 12/02/2017 07:48   Mr 3d Recon At Scanner  Result Date: 12/02/2017 CLINICAL DATA:  Upper abdominal pain. Abnormal liver function tests. Dilated common bile duct on ultrasound. Acute pancreatitis on CT scan. EXAM: MRI ABDOMEN WITHOUT AND WITH CONTRAST (INCLUDING MRCP) TECHNIQUE: Multiplanar multisequence MR imaging of the abdomen was performed both before and after the administration of intravenous contrast. Heavily T2-weighted images of the biliary and pancreatic ducts were obtained, and three-dimensional MRCP images were rendered by post processing. CONTRAST:  18mL MULTIHANCE GADOBENATE DIMEGLUMINE 529 MG/ML IV SOLN COMPARISON:  12/02/2017 CT scan FINDINGS: Despite efforts by the technologist and patient, motion artifact is present on today's exam and could not be eliminated. This is a common result when MRCP is attempted in the inpatient setting where patients are less likely to be able to breath hold and cooperate in controlling motion. This reduces exam sensitivity and specificity. Lower chest: Stable cardiomegaly. Aortic valve prosthesis. Mild atelectasis in the posterior basal segments of both lower lobes. Hepatobiliary: Cholelithiasis is observed. On images 45 through 50 of series 5, there is low signal intensity filling defect in the distal CBD compatible with  small stones in the setting of choledocholithiasis. This is a likely cause for the biliary dilatation and pancreatitis. There appear to be multiple clustered calculi in the 5 mm in less range in the vicinity of the ampulla. The filling defects are also shown on image 23/4 and images 29 through 31 of series 24. Mild extrahepatic biliary dilatation, with the common bile duct at about 9 mm. Pancreas: Peripancreatic edema compatible with acute pancreatitis. No abscess, pseudocyst, or findings of pancreatic necrosis. Spleen:  Unremarkable Adrenals/Urinary Tract:  Unremarkable Stomach/Bowel: Mild secondary inflammation of the duodenum. Vascular/Lymphatic:  Aortoiliac atherosclerotic vascular disease. Other: Small amount of ascites along the inferior right hepatic margin. Musculoskeletal: Unremarkable IMPRESSION: 1. Choledocholithiasis with several clustered small (  less than 5 mm) stones in the distal CBD. These at the likely cause of the mild biliary dilatation and acute pancreatitis. 2. Peripancreatic edema compatible with acute pancreatitis. No findings of pancreatic abscess, pseudocyst, or pancreatic necrosis. 3. Stable cardiomegaly with aortic valve prosthesis. 4. Mild subsegmental atelectasis in the posterior basal segments of both lower lobes. 5. Gallstones in the gallbladder. 6.  Aortic Atherosclerosis (ICD10-I70.0). Electronically Signed   By: Gaylyn Rong M.D.   On: 12/02/2017 15:13   Dg Chest Port 1 View  Result Date: 12/07/2017 CLINICAL DATA:  Status post NG tube placement. EXAM: PORTABLE CHEST 1 VIEW COMPARISON:  Chest radiograph 12/06/2017. FINDINGS: Enteric tube tip and side-port project over the stomach. Stable cardiac and mediastinal contours status post median sternotomy. Focal consolidation left lower hemithorax. No pleural effusion or pneumothorax. Gaseous distended loops of bowel within the upper abdomen. IMPRESSION: Enteric tube tip and side-port project over the stomach. Gaseous distended  loops of bowel within the upper abdomen. Consider dedicated abdominal radiography. Focal opacity left lower hemithorax may represent atelectasis. Infection not excluded. Electronically Signed   By: Annia Belt M.D.   On: 12/07/2017 21:32   Dg Ercp Biliary & Pancreatic Ducts  Result Date: 12/04/2017 CLINICAL DATA:  Choledocholithiasis EXAM: ERCP WITH SPHINCTEROTOMY TECHNIQUE: Multiple spot images obtained with the fluoroscopic device and submitted for interpretation post-procedure. FLUOROSCOPY TIME:  Fluoroscopy Time:  2 minutes 51 seconds Radiation Exposure Index (if provided by the fluoroscopic device): Not provided Number of Acquired Spot Images: multiple fluoroscopic screen captures COMPARISON:  MRCP 12/02/2017 FINDINGS: Images demonstrate upper normal diameter CBD with smooth walls. A few small filling defects are seen at the distal CBD compatible with choledocholithiasis. Multiple small air bubbles are seen later in the exam within the distal CBD. Passage of a balloon catheter was made with clearance of filling defects. Endoscopic visualization confirmed removal of stones with balloon catheter passage. Last image demonstrates adequate drainage of contrast from the the CBD IMPRESSION: Choledocholithiasis. These images were submitted for radiologic interpretation only. Please see the procedural report for the amount of contrast and the fluoroscopy time utilized. Electronically Signed   By: Ulyses Southward M.D.   On: 12/04/2017 14:19   Dg Abdomen Acute W/chest  Result Date: 12/02/2017 CLINICAL DATA:  Abdominal pain since this morning. EXAM: DG ABDOMEN ACUTE W/ 1V CHEST COMPARISON:  Chest 02/05/2017 FINDINGS: Postoperative changes in the mediastinum. Shallow inspiration with atelectasis in the lung bases. Cardiac enlargement without vascular congestion or edema. No focal consolidation. No pneumothorax. Calcification of the aorta. Severe degenerative changes in the shoulders with resection or resorption of the  distal left clavicle. Scattered gas and stool throughout the colon. No small or large bowel distention. No free intra-abdominal air. No abnormal air-fluid levels. Degenerative changes in the spine and hips. IMPRESSION: Shallow inspiration with atelectasis in the lung bases. Cardiac enlargement. Aortic atherosclerosis. Nonobstructive bowel gas pattern with stool-filled colon. Electronically Signed   By: Burman Nieves M.D.   On: 12/02/2017 06:17   Dg Abd Portable 1v  Result Date: 12/10/2017 CLINICAL DATA:  72 year old male with abdominal pain. Pancreatitis. Large bowel distention on CT 2 days ago favored due to colonic ileus. EXAM: PORTABLE ABDOMEN - 1 VIEW COMPARISON:  CT Abdomen and Pelvis 12/08/2017 and earlier. FINDINGS: Portable AP supine view at 1332 hrs. Stable enteric tube with side hole up the level of the proximal gastric body. Resolved gas distended large bowel loops. Similar gas-filled nondilated small bowel. The gas pattern now is nonobstructed. No  definite pneumoperitoneum on this supine view. No acute osseous abnormality identified. IMPRESSION: Resolved gas distended colon and essentially normalized bowel-gas pattern since 12/08/2017. Electronically Signed   By: Odessa Fleming M.D.   On: 12/10/2017 14:08   Dg Abd Portable 1v  Result Date: 12/06/2017 CLINICAL DATA:  Nausea, abdominal distension, recent ERCP and sphincterotomy with extraction of CBD stones EXAM: PORTABLE ABDOMEN - 1 VIEW COMPARISON:  Portable exam 1042 hours compared to 12/02/2017 FINDINGS: Air-filled small bowel loops throughout abdomen. Gaseous distention of transverse colon up to 13.2 cm diameter. No definite bowel wall thickening. Bones demineralized with degenerative disc and facet disease changes of the lumbar spine. IMPRESSION: Gaseous distention of colon up to 13.2 cm diameter. Additional air-filled upper normal caliber small bowel loops. Electronically Signed   By: Ulyses Southward M.D.   On: 12/06/2017 12:19   Mr Abdomen Mrcp Vivien Rossetti Contast  Result Date: 12/02/2017 CLINICAL DATA:  Upper abdominal pain. Abnormal liver function tests. Dilated common bile duct on ultrasound. Acute pancreatitis on CT scan. EXAM: MRI ABDOMEN WITHOUT AND WITH CONTRAST (INCLUDING MRCP) TECHNIQUE: Multiplanar multisequence MR imaging of the abdomen was performed both before and after the administration of intravenous contrast. Heavily T2-weighted images of the biliary and pancreatic ducts were obtained, and three-dimensional MRCP images were rendered by post processing. CONTRAST:  18mL MULTIHANCE GADOBENATE DIMEGLUMINE 529 MG/ML IV SOLN COMPARISON:  12/02/2017 CT scan FINDINGS: Despite efforts by the technologist and patient, motion artifact is present on today's exam and could not be eliminated. This is a common result when MRCP is attempted in the inpatient setting where patients are less likely to be able to breath hold and cooperate in controlling motion. This reduces exam sensitivity and specificity. Lower chest: Stable cardiomegaly. Aortic valve prosthesis. Mild atelectasis in the posterior basal segments of both lower lobes. Hepatobiliary: Cholelithiasis is observed. On images 45 through 50 of series 5, there is low signal intensity filling defect in the distal CBD compatible with small stones in the setting of choledocholithiasis. This is a likely cause for the biliary dilatation and pancreatitis. There appear to be multiple clustered calculi in the 5 mm in less range in the vicinity of the ampulla. The filling defects are also shown on image 23/4 and images 29 through 31 of series 24. Mild extrahepatic biliary dilatation, with the common bile duct at about 9 mm. Pancreas: Peripancreatic edema compatible with acute pancreatitis. No abscess, pseudocyst, or findings of pancreatic necrosis. Spleen:  Unremarkable Adrenals/Urinary Tract:  Unremarkable Stomach/Bowel: Mild secondary inflammation of the duodenum. Vascular/Lymphatic:  Aortoiliac atherosclerotic  vascular disease. Other: Small amount of ascites along the inferior right hepatic margin. Musculoskeletal: Unremarkable IMPRESSION: 1. Choledocholithiasis with several clustered small (less than 5 mm) stones in the distal CBD. These at the likely cause of the mild biliary dilatation and acute pancreatitis. 2. Peripancreatic edema compatible with acute pancreatitis. No findings of pancreatic abscess, pseudocyst, or pancreatic necrosis. 3. Stable cardiomegaly with aortic valve prosthesis. 4. Mild subsegmental atelectasis in the posterior basal segments of both lower lobes. 5. Gallstones in the gallbladder. 6.  Aortic Atherosclerosis (ICD10-I70.0). Electronically Signed   By: Gaylyn Rong M.D.   On: 12/02/2017 15:13   US Abdomen Limited Ruq  Result Date: 12/02/2017 CLINICAL DATA:  Acute cholelithiasis, pancreatitis. EXAM: ULTRASOUND ABDOMEN LIMITED RIGHT UPPER QUADRANT COMPARISON:  CT of the abdomen and pelvis from the same day. FINDINGS: Gallbladder: Sludge is present within the gallbladder. No visible stones are present. Wall is thickened, measuring up to 7  mm. There is no sonographic Murphy sign. Common bile duct: Diameter: The common bile duct is dilated measuring up to 11.6 mm. Liver: Liver is diffusely echogenic. No discrete lesions are present. Portal vein is patent on color Doppler imaging with normal direction of blood flow towards the liver. IMPRESSION: 1. Dilated common bile duct likely associated with the acute pancreatitis. 2. Gallbladder wall thickening and sludge may also be associated with acute pancreatitis. 3. No gallstones or sonographic Murphy sign to suggest primary gallbladder pathology. Electronically Signed   By: Marin Roberts M.D.   On: 12/02/2017 11:27    Lab Data:  CBC: Recent Labs  Lab 12/07/17 0551 12/08/17 0534 12/10/17 1148 12/11/17 0536 12/12/17 0506  WBC 10.0 8.4 8.2 8.5 10.1  HGB 10.3* 10.5* 10.0* 10.6* 10.5*  HCT 32.0* 33.2* 30.8* 32.2* 31.5*  MCV  107.7* 108.5* 106.2* 103.5* 105.0*  PLT 226 250 254 266 277   Basic Metabolic Panel: Recent Labs  Lab 12/07/17 1341 12/08/17 0534 12/09/17 0436 12/10/17 1148 12/11/17 0536 12/12/17 0506  NA  --  142 144 143 138 134*  K  --  4.5 4.5 4.3 3.8 5.2*  CL  --  108 111 110 106 101  CO2  --  22 23 20* 24 26  GLUCOSE  --  88 73 56* 166* 177*  BUN  --  23* 22* 17 10 8   CREATININE  --  0.96 0.91 1.10 0.80 0.88  CALCIUM  --  8.9 9.2 8.9 8.7* 8.7*  MG 1.9 2.0 1.6* 1.3* 1.1* 1.3*  PHOS 2.5 3.0 3.7  --  2.6 3.5   GFR: Estimated Creatinine Clearance: 86.6 mL/min (by C-G formula based on SCr of 0.88 mg/dL). Liver Function Tests: Recent Labs  Lab 12/06/17 0730 12/07/17 0551 12/10/17 1148 12/11/17 0536 12/12/17 0506  AST 44* 26 21 20  71*  ALT 61 41 23 21 43  ALKPHOS 141* 113 98 89 93  BILITOT 1.7* 1.7* 1.6* 1.4* 1.1  PROT 7.2 6.6 5.9* 5.6* 5.6*  ALBUMIN 2.9* 2.6* 2.3* 2.2* 2.1*   Recent Labs  Lab 12/06/17 0730 12/10/17 1148  LIPASE 79* 57*   No results for input(s): AMMONIA in the last 168 hours. Coagulation Profile: Recent Labs  Lab 12/10/17 1148 12/11/17 0536  INR 1.34 1.42   Cardiac Enzymes: No results for input(s): CKTOTAL, CKMB, CKMBINDEX, TROPONINI in the last 168 hours. BNP (last 3 results) No results for input(s): PROBNP in the last 8760 hours. HbA1C: No results for input(s): HGBA1C in the last 72 hours. CBG: No results for input(s): GLUCAP in the last 168 hours. Lipid Profile: No results for input(s): CHOL, HDL, LDLCALC, TRIG, CHOLHDL, LDLDIRECT in the last 72 hours. Thyroid Function Tests: No results for input(s): TSH, T4TOTAL, FREET4, T3FREE, THYROIDAB in the last 72 hours. Anemia Panel: No results for input(s): VITAMINB12, FOLATE, FERRITIN, TIBC, IRON, RETICCTPCT in the last 72 hours. Urine analysis:    Component Value Date/Time   COLORURINE YELLOW 12/02/2017 2335   APPEARANCEUR CLEAR 12/02/2017 2335   LABSPEC 1.036 (H) 12/02/2017 2335   PHURINE 5.0  12/02/2017 2335   GLUCOSEU NEGATIVE 12/02/2017 2335   HGBUR SMALL (A) 12/02/2017 2335   BILIRUBINUR NEGATIVE 12/02/2017 2335   KETONESUR NEGATIVE 12/02/2017 2335   PROTEINUR NEGATIVE 12/02/2017 2335   UROBILINOGEN 1.0 07/14/2012 1243   NITRITE NEGATIVE 12/02/2017 2335   LEUKOCYTESUR NEGATIVE 12/02/2017 2335     Eirik Schueler M.D. [facility] 12/12/2017, 2:27 PM  Pager: [phone] Between 7am to 7pm - call  Pager - [phone]  After 7pm go to [email or web address] - password [email or web address]  Call night coverage person covering after 7pm

## 2017-12-13 LAB — BASIC METABOLIC PANEL
ANION GAP: 9 (ref 5–15)
BUN: 8 mg/dL (ref 6–20)
CO2: 26 mmol/L (ref 22–32)
Calcium: 8.5 mg/dL — ABNORMAL LOW (ref 8.9–10.3)
Chloride: 99 mmol/L — ABNORMAL LOW (ref 101–111)
Creatinine, Ser: 0.93 mg/dL (ref 0.61–1.24)
GFR calc Af Amer: >60 mL/min (ref >60)
GLUCOSE: 134 mg/dL — AB (ref 65–99)
POTASSIUM: 4.1 mmol/L (ref 3.5–5.1)
Sodium: 134 mmol/L — ABNORMAL LOW (ref 135–145)

## 2017-12-13 LAB — CBC
HEMATOCRIT: 32.6 % — AB (ref 39.0–52.0)
Hemoglobin: 10.8 g/dL — ABNORMAL LOW (ref 13.0–17.0)
MCH: 34.5 pg — ABNORMAL HIGH (ref 26.0–34.0)
MCHC: 33.1 g/dL (ref 30.0–36.0)
MCV: 104.2 fL — AB (ref 78.0–100.0)
PLATELETS: 323 10*3/uL (ref 150–400)
RBC: 3.13 MIL/uL — AB (ref 4.22–5.81)
RDW: 13.4 % (ref 11.5–15.5)
WBC: 10.8 10*3/uL — AB (ref 4.0–10.5)

## 2017-12-13 LAB — PHOSPHORUS: Phosphorus: 2.8 mg/dL (ref 2.5–4.6)

## 2017-12-13 LAB — MAGNESIUM: Magnesium: 2 mg/dL (ref 1.7–2.4)

## 2017-12-13 LAB — PROTIME-INR
INR: 1.31
Prothrombin Time: 16.2 seconds — ABNORMAL HIGH (ref 11.4–15.2)

## 2017-12-13 MED ORDER — ALBUTEROL SULFATE HFA 108 (90 BASE) MCG/ACT IN AERS: 2.0000 | INHALATION_SPRAY | Freq: Four times a day (QID) | RESPIRATORY_TRACT | 2 refills | Status: AC | PRN

## 2017-12-13 MED ORDER — WARFARIN SODIUM 5 MG PO TABS: 10.0000 mg | ORAL_TABLET | Freq: Once | ORAL | Status: DC

## 2017-12-13 MED ORDER — TRAMADOL HCL 50 MG PO TABS: 50.0000 mg | ORAL_TABLET | Freq: Two times a day (BID) | ORAL | 0 refills | Status: AC | PRN

## 2017-12-13 MED ORDER — SENNOSIDES-DOCUSATE SODIUM 8.6-50 MG PO TABS: 1.0000 | ORAL_TABLET | Freq: Every day | ORAL | 3 refills | Status: AC

## 2017-12-13 MED ORDER — ONDANSETRON 4 MG PO TBDP: 4.0000 mg | ORAL_TABLET | Freq: Three times a day (TID) | ORAL | 0 refills | Status: AC | PRN

## 2017-12-13 MED ORDER — POLYETHYLENE GLYCOL 3350 17 G PO PACK: 17.0000 g | PACK | Freq: Every day | ORAL | 0 refills | Status: AC | PRN

## 2017-12-13 NOTE — Discharge Summary (Signed)
Physician Discharge Summary   Patient ID: [name] MRN: [id number] DOB/AGE: [birth year] 72 y.o.  Admit date: 12/02/2017 Discharge date: 12/13/2017  Primary Care Physician:  Benita Stabile, MD  Discharge Diagnoses:   Acute gallstone pancreatitis Choledocholithiasis Status post laparoscopic cholecystectomy Acute colonic ileus Hypomagnesemia Chronic atrial fibrillation History of COPD with mild exacerbation  Consults:   Gastroenterology General surgery  Recommendations for Outpatient Follow-up:  1. Please repeat CBC/BMET at next visit   DIET: Heart healthy diet    Allergies:   Allergies  Allergen Reactions  . Diclofenac Itching  . Naproxen     itching     DISCHARGE MEDICATIONS: Allergies as of 12/13/2017      Reactions   Diclofenac Itching   Naproxen    itching      Medication List    STOP taking these medications   azithromycin 250 MG tablet Commonly known as:  ZITHROMAX   benzonatate 200 MG capsule Commonly known as:  TESSALON     TAKE these medications   acetaminophen 500 MG tablet Commonly known as:  TYLENOL Take 1,000 mg by mouth every 6 (six) hours as needed.   albuterol 108 (90 Base) MCG/ACT inhaler Commonly known as:  PROVENTIL HFA;VENTOLIN HFA Inhale 2 puffs into the lungs every 6 (six) hours as needed for wheezing or shortness of breath.   lisinopril 5 MG tablet Commonly known as:  PRINIVIL,ZESTRIL TAKE ONE (1) TABLET EACH DAY   metoprolol succinate 50 MG 24 hr tablet Commonly known as:  TOPROL-XL Take 1 tablet (50 mg total) by mouth 2 (two) times daily. Take with or immediately following a meal.   ondansetron 4 MG disintegrating tablet Commonly known as:  ZOFRAN ODT Take 1 tablet (4 mg total) by mouth every 8 (eight) hours as needed for nausea or vomiting.   polyethylene glycol packet Commonly known as:  MIRALAX Take 17 g by mouth daily as needed for moderate constipation. Available OTC   senna-docusate 8.6-50 MG  tablet Commonly known as:  SENOKOT S Take 1 tablet by mouth at bedtime. For constipation   traMADol 50 MG tablet Commonly known as:  ULTRAM Take 1 tablet (50 mg total) by mouth every 12 (twelve) hours as needed for moderate pain or severe pain.   warfarin 5 MG tablet Commonly known as:  COUMADIN Take as directed. If you are unsure how to take this medication, talk to your nurse or doctor. Original instructions:  Take 2 tablets daily except 1 tablet on Mondays and Fridays or as directed        Brief H and P: For complete details please refer to admission H and P, but in briefPatient is a 72 year old male with history of chronic atrial fibrillation status post maze, failed DC CV, CAD, aortic stenosis status post bioprosthetic AVR, hyperlipidemia, hypertension presented with abdominal pain, nausea and vomiting on 1/22.  He was found to have lipase over 3000 with elevated LFTs.CT of the abdomen and pelvis showed cholelithiasis with gallbladder wall thickening with intra and extrahepatic ductal dilatation as well as common bile duct dilatation.   Patient was admitted for further workup. Patient was seen by GI, surgery and cardiology (for preop clearance).  Patient's procedures were initially delayed due to iatrogenic coagulopathy (Coumadin for A. Fib).  Underwent ERCP on 1/24 however during ERCP, had A. fib with RVR with heart rate into 190s.  Cholecystectomy was canceled.  On 1/28, patient had marked distention of his colon up to 17 cm on x-ray with  concern for possible volvulus.  CT abdomen was negative, patient underwent colonoscopic decompression by Dr. Darrick Penna.  Antibiotics were restarted due to concern for possible mild ischemic colitis.  Family requested transfer to [facility].  Hospital Course:    Gallstone pancreatitis, choledocholithiasis -CT of the abdomen at the time of admission showed cholelithiasis with gallbladder wall thickening, intra-and extrahepatic ductal dilatation, CBD  dilatation, lipase> 3000 -ERCP 1/24 showed choledocholithiasis status post successful endoscopic sphincterotomy, balloon dilatation and stone extraction. -Status post laparoscopic cholecystectomy, postop day #2 -Doing well, currently no pain, tolerating solid diet without any difficulty.  Follow-up with surgery outpatient.  Cleared by general surgery for discharge home.  Colonic ileus -Patient underwent colonoscopic decompression on 1/28 at [facility] -Patient having bowel movements, no need of any further antibiotics.  Recommended good bowel regimen at home  Hypomagnesemia -During hospitalization patient had hypomagnesemia, improved with replacement 2.0 at the time of discharge  Mild hyperkalemia on 2/1 -Improved, potassium 4.1 at the time of discharge.     Atrial fibrillation, chronic (HCC)  -Patient was placed on IV metoprolol during admission due to n.p.o. Status -Outpatient Toprol has been started, Coumadin started  History of COPD -Wheezing improved, patient given prescription for albuterol inhaler as needed    Day of Discharge Doing well, wants to go home, tolerating solid diet  BP 120/80 (BP Location: Left Arm)   Pulse (!) 104   Temp 98.1 F (36.7 C) (Oral)   Resp 18   Ht 5\' 10"  (1.778 m)   Wt 92.3 kg (203 lb 7.8 oz)   SpO2 100%   BMI 29.20 kg/m   Physical Exam: General: Alert and awake oriented x3 not in any acute distress. HEENT: anicteric sclera, pupils reactive to light and accommodation CVS: S1-S2 clear no murmur rubs or gallops Chest: clear to auscultation bilaterally, no wheezing rales or rhonchi Abdomen: soft nontender, nondistended, normal bowel sounds, incision CDI Extremities: no cyanosis, clubbing or edema noted bilaterally Neuro: Cranial nerves II-XII intact, no focal neurological deficits   The results of significant diagnostics from this hospitalization (including imaging, microbiology, ancillary and laboratory) are listed below for reference.     LAB RESULTS: Basic Metabolic Panel: Recent Labs  Lab 12/12/17 0506 12/13/17 0512  NA 134* 134*  K 5.2* 4.1  CL 101 99*  CO2 26 26  GLUCOSE 177* 134*  BUN 8 8  CREATININE 0.88 0.93  CALCIUM 8.7* 8.5*  MG 1.3* 2.0  PHOS 3.5 2.8   Liver Function Tests: Recent Labs  Lab 12/11/17 0536 12/12/17 0506  AST 20 71*  ALT 21 43  ALKPHOS 89 93  BILITOT 1.4* 1.1  PROT 5.6* 5.6*  ALBUMIN 2.2* 2.1*   Recent Labs  Lab 12/10/17 1148  LIPASE 57*   No results for input(s): AMMONIA in the last 168 hours. CBC: Recent Labs  Lab 12/12/17 0506 12/13/17 0512  WBC 10.1 10.8*  HGB 10.5* 10.8*  HCT 31.5* 32.6*  MCV 105.0* 104.2*  PLT 277 323   Cardiac Enzymes: No results for input(s): CKTOTAL, CKMB, CKMBINDEX, TROPONINI in the last 168 hours. BNP: Invalid input(s): POCBNP CBG: No results for input(s): GLUCAP in the last 168 hours.  Significant Diagnostic Studies:  Ct Abdomen Pelvis W Contrast  Result Date: 12/02/2017 CLINICAL DATA:  Altered bowel habits. Treatment for diarrhea has resulted in no bowel movement for 2 days. Increasing abdominal pain. EXAM: CT ABDOMEN AND PELVIS WITH CONTRAST TECHNIQUE: Multidetector CT imaging of the abdomen and pelvis was performed using the standard protocol  following bolus administration of intravenous contrast. CONTRAST:  ISOVUE-300 IOPAMIDOL (ISOVUE-300) INJECTION 61% COMPARISON:  Multiple exams, including CT scan from 07/23/2012 FINDINGS: Lower chest: Moderate cardiomegaly. Aortic valve prosthesis. Posterior wall calcifications in the left atrium and to a lesser extent in the right atrium. Thinning of the left ventricular myocardium. Mild atelectasis in the lingula and mild dependent atelectasis in both lower lobes. Circumflex coronary artery atherosclerotic calcification. Hepatobiliary: Mild intrahepatic biliary dilatation. Common hepatic duct 1.6 cm, formerly 0.8 cm on 07/23/2012. Common bile duct 1.2 cm, formerly 0.8 cm. This CBD  dilatation extends to the ampulla Dependent densities in the gallbladder are compatible with gallstones. Borderline wall thickening in portions of the gallbladder. Pancreas: Abnormal peripancreatic stranding diffusely without pseudocyst, abscess, or necrosis. Spleen: Unremarkable Adrenals/Urinary Tract: Stable fullness of the right adrenal gland. Left adrenal gland unremarkable. 0.7 by 1.4 cm hypodense lesion of the left kidney upper pole is likely a small cyst but technically too small to characterize. Stomach/Bowel: Mild wall thickening and accentuated enhancement in the duodenal bulb, descending duodenum, and proximal transverse duodenum noted, secondary inflammation is favored over peptic ulcer disease. Several small diverticula of the transverse duodenum do not appear to be the epicenter of the regional inflammatory process. Descending and sigmoid colon diverticulosis without active diverticulitis. Scattered diverticula of the ascending colon noted. Appendix normal. Vascular/Lymphatic: Aortoiliac atherosclerotic vascular disease. Reproductive: Unremarkable Other: Small amount of free fluid adjacent to the descending duodenum and tracking in the right paracolic gutter. Small amount of central mesenteric edema. Musculoskeletal: Old healed right lower lateral rib fractures. Subacute healing fracture of the left anterior fifth and sixth ribs. Prior median sternotomy. Mildly exaggerated lumbar lordosis. Grade 1 degenerative anterolisthesis at L5-S1 with slightly transitional S1 level. Lumbar spondylosis and degenerative disc disease with mild impingement at L4-5 and L5-S1. IMPRESSION: 1. Acute pancreatitis with peripancreatic stranding and a small amount of ascites tracking in the right paracolic gutter. No findings of pancreatic abscess, necrosis, or pseudocyst. 2. Gallstones are present in the gallbladder and there is new intrahepatic and extrahepatic biliary dilatation, raising the strong possibility of occult  distal CBD stone as a potential cause for the pancreatitis and biliary dilatation. Minimal gallbladder wall thickening. 3. Subacute healing fractures of the left anterior fifth and sixth ribs. 4. Cardiomegaly with wall calcifications along the atria and thinning of the left ventricular myocardium. Coronary atherosclerosis. Scattered colonic diverticulosis diverticula of the transverse duodenum. There are also some. 5.  Aortic Atherosclerosis (ICD10-I70.0). 6. Mild lumbar impingement at L4-5 and L5-S1. Electronically Signed   By: Gaylyn Rong M.D.   On: 12/02/2017 07:48   Mr 3d Recon At Scanner  Result Date: 12/02/2017 CLINICAL DATA:  Upper abdominal pain. Abnormal liver function tests. Dilated common bile duct on ultrasound. Acute pancreatitis on CT scan. EXAM: MRI ABDOMEN WITHOUT AND WITH CONTRAST (INCLUDING MRCP) TECHNIQUE: Multiplanar multisequence MR imaging of the abdomen was performed both before and after the administration of intravenous contrast. Heavily T2-weighted images of the biliary and pancreatic ducts were obtained, and three-dimensional MRCP images were rendered by post processing. CONTRAST:  18mL MULTIHANCE GADOBENATE DIMEGLUMINE 529 MG/ML IV SOLN COMPARISON:  12/02/2017 CT scan FINDINGS: Despite efforts by the technologist and patient, motion artifact is present on today's exam and could not be eliminated. This is a common result when MRCP is attempted in the inpatient setting where patients are less likely to be able to breath hold and cooperate in controlling motion. This reduces exam sensitivity and specificity. Lower chest: Stable cardiomegaly. Aortic  valve prosthesis. Mild atelectasis in the posterior basal segments of both lower lobes. Hepatobiliary: Cholelithiasis is observed. On images 45 through 50 of series 5, there is low signal intensity filling defect in the distal CBD compatible with small stones in the setting of choledocholithiasis. This is a likely cause for the biliary  dilatation and pancreatitis. There appear to be multiple clustered calculi in the 5 mm in less range in the vicinity of the ampulla. The filling defects are also shown on image 23/4 and images 29 through 31 of series 24. Mild extrahepatic biliary dilatation, with the common bile duct at about 9 mm. Pancreas: Peripancreatic edema compatible with acute pancreatitis. No abscess, pseudocyst, or findings of pancreatic necrosis. Spleen:  Unremarkable Adrenals/Urinary Tract:  Unremarkable Stomach/Bowel: Mild secondary inflammation of the duodenum. Vascular/Lymphatic:  Aortoiliac atherosclerotic vascular disease. Other: Small amount of ascites along the inferior right hepatic margin. Musculoskeletal: Unremarkable IMPRESSION: 1. Choledocholithiasis with several clustered small (less than 5 mm) stones in the distal CBD. These at the likely cause of the mild biliary dilatation and acute pancreatitis. 2. Peripancreatic edema compatible with acute pancreatitis. No findings of pancreatic abscess, pseudocyst, or pancreatic necrosis. 3. Stable cardiomegaly with aortic valve prosthesis. 4. Mild subsegmental atelectasis in the posterior basal segments of both lower lobes. 5. Gallstones in the gallbladder. 6.  Aortic Atherosclerosis (ICD10-I70.0). Electronically Signed   By: Gaylyn RongWalter  Liebkemann M.D.   On: 12/02/2017 15:13   Dg Abdomen Acute W/chest  Result Date: 12/02/2017 CLINICAL DATA:  Abdominal pain since this morning. EXAM: DG ABDOMEN ACUTE W/ 1V CHEST COMPARISON:  Chest 02/05/2017 FINDINGS: Postoperative changes in the mediastinum. Shallow inspiration with atelectasis in the lung bases. Cardiac enlargement without vascular congestion or edema. No focal consolidation. No pneumothorax. Calcification of the aorta. Severe degenerative changes in the shoulders with resection or resorption of the distal left clavicle. Scattered gas and stool throughout the colon. No small or large bowel distention. No free intra-abdominal air. No  abnormal air-fluid levels. Degenerative changes in the spine and hips. IMPRESSION: Shallow inspiration with atelectasis in the lung bases. Cardiac enlargement. Aortic atherosclerosis. Nonobstructive bowel gas pattern with stool-filled colon. Electronically Signed   By: Burman NievesWilliam  Stevens M.D.   On: 12/02/2017 06:17   Mr Abdomen Mrcp Vivien RossettiW Wo Contast  Result Date: 12/02/2017 CLINICAL DATA:  Upper abdominal pain. Abnormal liver function tests. Dilated common bile duct on ultrasound. Acute pancreatitis on CT scan. EXAM: MRI ABDOMEN WITHOUT AND WITH CONTRAST (INCLUDING MRCP) TECHNIQUE: Multiplanar multisequence MR imaging of the abdomen was performed both before and after the administration of intravenous contrast. Heavily T2-weighted images of the biliary and pancreatic ducts were obtained, and three-dimensional MRCP images were rendered by post processing. CONTRAST:  18mL MULTIHANCE GADOBENATE DIMEGLUMINE 529 MG/ML IV SOLN COMPARISON:  12/02/2017 CT scan FINDINGS: Despite efforts by the technologist and patient, motion artifact is present on today's exam and could not be eliminated. This is a common result when MRCP is attempted in the inpatient setting where patients are less likely to be able to breath hold and cooperate in controlling motion. This reduces exam sensitivity and specificity. Lower chest: Stable cardiomegaly. Aortic valve prosthesis. Mild atelectasis in the posterior basal segments of both lower lobes. Hepatobiliary: Cholelithiasis is observed. On images 45 through 50 of series 5, there is low signal intensity filling defect in the distal CBD compatible with small stones in the setting of choledocholithiasis. This is a likely cause for the biliary dilatation and pancreatitis. There appear to be multiple clustered calculi  in the 5 mm in less range in the vicinity of the ampulla. The filling defects are also shown on image 23/4 and images 29 through 31 of series 24. Mild extrahepatic biliary dilatation,  with the common bile duct at about 9 mm. Pancreas: Peripancreatic edema compatible with acute pancreatitis. No abscess, pseudocyst, or findings of pancreatic necrosis. Spleen:  Unremarkable Adrenals/Urinary Tract:  Unremarkable Stomach/Bowel: Mild secondary inflammation of the duodenum. Vascular/Lymphatic:  Aortoiliac atherosclerotic vascular disease. Other: Small amount of ascites along the inferior right hepatic margin. Musculoskeletal: Unremarkable IMPRESSION: 1. Choledocholithiasis with several clustered small (less than 5 mm) stones in the distal CBD. These at the likely cause of the mild biliary dilatation and acute pancreatitis. 2. Peripancreatic edema compatible with acute pancreatitis. No findings of pancreatic abscess, pseudocyst, or pancreatic necrosis. 3. Stable cardiomegaly with aortic valve prosthesis. 4. Mild subsegmental atelectasis in the posterior basal segments of both lower lobes. 5. Gallstones in the gallbladder. 6.  Aortic Atherosclerosis (ICD10-I70.0). Electronically Signed   By: Gaylyn Rong M.D.   On: 12/02/2017 15:13   US Abdomen Limited Ruq  Result Date: 12/02/2017 CLINICAL DATA:  Acute cholelithiasis, pancreatitis. EXAM: ULTRASOUND ABDOMEN LIMITED RIGHT UPPER QUADRANT COMPARISON:  CT of the abdomen and pelvis from the same day. FINDINGS: Gallbladder: Sludge is present within the gallbladder. No visible stones are present. Wall is thickened, measuring up to 7 mm. There is no sonographic Murphy sign. Common bile duct: Diameter: The common bile duct is dilated measuring up to 11.6 mm. Liver: Liver is diffusely echogenic. No discrete lesions are present. Portal vein is patent on color Doppler imaging with normal direction of blood flow towards the liver. IMPRESSION: 1. Dilated common bile duct likely associated with the acute pancreatitis. 2. Gallbladder wall thickening and sludge may also be associated with acute pancreatitis. 3. No gallstones or sonographic Murphy sign to suggest  primary gallbladder pathology. Electronically Signed   By: Marin Roberts M.D.   On: 12/02/2017 11:27    2D ECHO:   Disposition and Follow-up: Discharge Instructions    Diet - low sodium heart healthy   Complete by:  As directed    Increase activity slowly   Complete by:  As directed        DISPOSITION: Home   DISCHARGE FOLLOW-UP Follow-up Information    [facility] Go on 12/25/2017.   Specialty:  General Surgery Why:  at 1:30, Please arrive 30 minutes prior to your appointment to check in and fill out paperwork. Bring photo ID and insurance information. Contact information: [address] [place] [address] [phone]       Benita Stabile, MD. Schedule an appointment as soon as possible for a visit in 2 week(s).   Specialty:  Internal Medicine Contact information: [address] [place] [address] [phone]            Time spent on Discharge: 35 minutes  Signed:   Thad Ranger M.D. [facility] 12/13/2017, 10:45 AM Pager: [phone]

## 2017-12-13 NOTE — Progress Notes (Signed)
2 Days Post-Op  Subjective: Stable and alert.  Tolerating diet.  Ambulatory.  Both he and his wife are asking if taking a home today.  He still feels a little bloated but no cramps or nausea. Passing flatus  Coumadin restarted yesterday for atrial fibrillation  Objective: Vital signs in last 24 hours: Temp:  [98 F (36.7 C)-98.6 F (37 C)] 98.1 F (36.7 C) (02/02 0447) Pulse Rate:  [75-102] 102 (02/02 0447) Resp:  [18] 18 (02/02 0447) BP: (127)/(74-85) 127/85 (02/02 0447) SpO2:  [95 %-98 %] 96 % (02/02 0447) Last BM Date: 12/11/17  Intake/Output from previous day: 02/01 0701 - 02/02 0700 In: 720 [P.O.:720] Out: -  Intake/Output this shift: No intake/output data recorded.  General appearance: alert.  No distress.  Mental status normal Resp: clear to auscultation bilaterally GI: abdomen a little distended.  This may be chronic due to his colonic ileus.  Bowel sounds present.  Completely nontender.  Trocar sites look good.  Lab Results:  Results for orders placed or performed during the hospital encounter of 12/02/17 (from the past 24 hour(s))  Phosphorus     Status: None   Collection Time: 12/13/17  5:12 AM  Result Value Ref Range   Phosphorus 2.8 2.5 - 4.6 mg/dL  Magnesium     Status: None   Collection Time: 12/13/17  5:12 AM  Result Value Ref Range   Magnesium 2.0 1.7 - 2.4 mg/dL  CBC     Status: Abnormal   Collection Time: 12/13/17  5:12 AM  Result Value Ref Range   WBC 10.8 (H) 4.0 - 10.5 K/uL   RBC 3.13 (L) 4.22 - 5.81 MIL/uL   Hemoglobin 10.8 (L) 13.0 - 17.0 g/dL   HCT 69.6 (L) 29.5 - 28.4 %   MCV 104.2 (H) 78.0 - 100.0 fL   MCH 34.5 (H) 26.0 - 34.0 pg   MCHC 33.1 30.0 - 36.0 g/dL   RDW 13.2 44.0 - 10.2 %   Platelets 323 150 - 400 K/uL  Basic metabolic panel     Status: Abnormal   Collection Time: 12/13/17  5:12 AM  Result Value Ref Range   Sodium 134 (L) 135 - 145 mmol/L   Potassium 4.1 3.5 - 5.1 mmol/L   Chloride 99 (L) 101 - 111 mmol/L   CO2 26 22 - 32  mmol/L   Glucose, Bld 134 (H) 65 - 99 mg/dL   BUN 8 6 - 20 mg/dL   Creatinine, Ser 7.25 0.61 - 1.24 mg/dL   Calcium 8.5 (L) 8.9 - 10.3 mg/dL   GFR calc non Af Amer >60 >60 mL/min   GFR calc Af Amer >60 >60 mL/min   Anion gap 9 5 - 15  Protime-INR     Status: Abnormal   Collection Time: 12/13/17  5:12 AM  Result Value Ref Range   Prothrombin Time 16.2 (H) 11.4 - 15.2 seconds   INR 1.31      Studies/Results: No results found.  . bisacodyl  5 mg Oral QPM  . feeding supplement  1 Container Oral TID BM  . magnesium oxide  400 mg Oral BID  . metoprolol succinate  50 mg Oral BID  . pantoprazole (PROTONIX) IV  40 mg Intravenous QHS  . Warfarin - Pharmacist Dosing Inpatient   Does not apply q1800     Assessment/Plan: s/p Procedure(s): LAPAROSCOPIC CHOLECYSTECTOMY WITH INTRAOPERATIVE CHOLANGIOGRAM  POD #2.  Laparoscopic cholecystectomy.  Recovering without any obvious surgical complications -He meets discharge criteria from surgical  standpoint -May be discharged when okay with primary admitting medical service -Follow-up arranged and loaded into a VAS -Discharge instructions loaded into a VAS  Colonic ileus, status post decompression by GI   @PROBHOSP @  LOS: 11 days    [name] 12/13/2017  . .prob

## 2017-12-13 NOTE — Progress Notes (Signed)
ANTICOAGULATION CONSULT NOTE - Initial Consult  Pharmacy Consult for Coumadin Indication: atrial fibrillation  Allergies  Allergen Reactions  . Diclofenac Itching  . Naproxen     itching    Patient Measurements: Height: 5\' 10"  (177.8 cm) Weight: 203 lb 7.8 oz (92.3 kg) IBW/kg (Calculated) : 73  Assessment: 72 yo M on Coumadin 10mg  daily exc 5mg  on Mon/Fri PTA for Afib. INR on admit was 2.14. Now to restart Coumadin s/p lap chole. INR down to 1.31. Hgb 10.8, plts wnl.  Goal of Therapy:  INR 2-3 Monitor platelets by anticoagulation protocol: Yes   Plan:  Give Coumadin 10mg  PO x 1 tonight Monitor daily INR, CBC, s/s of bleed Consider need to bridge?  [name] J 12/13/2017,9:38 AM

## 2017-12-13 NOTE — Progress Notes (Signed)
Discharge paperwork reviewed with patient. Prescriptions given. No questions verbalized. Patient is ready for discharge.

## 2017-12-19 DIAGNOSIS — Z712 Person consulting for explanation of examination or test findings: Secondary | ICD-10-CM | POA: Diagnosis not present

## 2017-12-19 DIAGNOSIS — J069 Acute upper respiratory infection, unspecified: Secondary | ICD-10-CM | POA: Diagnosis not present

## 2017-12-19 DIAGNOSIS — I10 Essential (primary) hypertension: Secondary | ICD-10-CM | POA: Diagnosis not present

## 2017-12-19 DIAGNOSIS — I340 Nonrheumatic mitral (valve) insufficiency: Secondary | ICD-10-CM | POA: Diagnosis not present

## 2017-12-19 DIAGNOSIS — Z7901 Long term (current) use of anticoagulants: Secondary | ICD-10-CM | POA: Diagnosis not present

## 2017-12-19 DIAGNOSIS — J449 Chronic obstructive pulmonary disease, unspecified: Secondary | ICD-10-CM | POA: Diagnosis not present

## 2017-12-24 ENCOUNTER — Ambulatory Visit (INDEPENDENT_AMBULATORY_CARE_PROVIDER_SITE_OTHER): Payer: Medicare Other | Admitting: *Deleted

## 2017-12-24 ENCOUNTER — Other Ambulatory Visit (HOSPITAL_COMMUNITY)
Admission: RE | Admit: 2017-12-24 | Discharge: 2017-12-24 | Disposition: A | Discharge status: Home / Self Care | Payer: Medicare Other | Source: Ambulatory Visit | Attending: Cardiology | Admitting: Cardiology

## 2017-12-24 DIAGNOSIS — I4891 Unspecified atrial fibrillation: Secondary | ICD-10-CM | POA: Insufficient documentation

## 2017-12-24 DIAGNOSIS — I359 Nonrheumatic aortic valve disorder, unspecified: Secondary | ICD-10-CM

## 2017-12-24 DIAGNOSIS — I482 Chronic atrial fibrillation, unspecified: Secondary | ICD-10-CM

## 2017-12-24 DIAGNOSIS — Z01810 Encounter for preprocedural cardiovascular examination: Secondary | ICD-10-CM

## 2017-12-24 DIAGNOSIS — Z5181 Encounter for therapeutic drug level monitoring: Secondary | ICD-10-CM

## 2017-12-24 LAB — PROTIME-INR
INR: 5.18 — AB
PROTHROMBIN TIME: 47.3 s — AB (ref 11.4–15.2)

## 2017-12-24 LAB — POCT INR: INR: 7.1

## 2017-12-24 NOTE — Patient Instructions (Signed)
POC INR 7.1  Sent to [facility] for STAT INR  5.18 Hold coumadin tonight and tomorrow night then decrease dose to 1 tablet daily except 2 tablets on Sundays, Tuesdays and Thursdays Recheck in 1 week Bleeding and fall precautions discussed with pt and he verbalized understanding.

## 2017-12-27 DIAGNOSIS — R0782 Intercostal pain: Secondary | ICD-10-CM | POA: Diagnosis not present

## 2017-12-31 ENCOUNTER — Ambulatory Visit (INDEPENDENT_AMBULATORY_CARE_PROVIDER_SITE_OTHER): Payer: Medicare Other | Admitting: *Deleted

## 2017-12-31 DIAGNOSIS — I4891 Unspecified atrial fibrillation: Secondary | ICD-10-CM | POA: Diagnosis not present

## 2017-12-31 DIAGNOSIS — Z01810 Encounter for preprocedural cardiovascular examination: Secondary | ICD-10-CM

## 2017-12-31 DIAGNOSIS — Z5181 Encounter for therapeutic drug level monitoring: Secondary | ICD-10-CM | POA: Diagnosis not present

## 2017-12-31 DIAGNOSIS — I482 Chronic atrial fibrillation, unspecified: Secondary | ICD-10-CM

## 2017-12-31 DIAGNOSIS — I359 Nonrheumatic aortic valve disorder, unspecified: Secondary | ICD-10-CM | POA: Diagnosis not present

## 2017-12-31 LAB — POCT INR: INR: 2.3

## 2017-12-31 NOTE — Patient Instructions (Signed)
Continue coumadin 1 tablet daily except 2 tablets on Sundays, Tuesdays and Thursdays Recheck in 1 week

## 2018-01-07 ENCOUNTER — Ambulatory Visit (INDEPENDENT_AMBULATORY_CARE_PROVIDER_SITE_OTHER): Payer: Medicare Other | Admitting: *Deleted

## 2018-01-07 DIAGNOSIS — I4891 Unspecified atrial fibrillation: Secondary | ICD-10-CM | POA: Diagnosis not present

## 2018-01-07 DIAGNOSIS — I482 Chronic atrial fibrillation, unspecified: Secondary | ICD-10-CM

## 2018-01-07 DIAGNOSIS — Z01810 Encounter for preprocedural cardiovascular examination: Secondary | ICD-10-CM

## 2018-01-07 DIAGNOSIS — Z5181 Encounter for therapeutic drug level monitoring: Secondary | ICD-10-CM

## 2018-01-07 DIAGNOSIS — I359 Nonrheumatic aortic valve disorder, unspecified: Secondary | ICD-10-CM

## 2018-01-07 LAB — POCT INR: INR: 1.8

## 2018-01-07 NOTE — Patient Instructions (Signed)
Increase coumadin to 2 tablets daily except 1 tablet on Sundays, Tuesdays and Thursdays Recheck in 2 weeks

## 2018-01-21 ENCOUNTER — Ambulatory Visit (INDEPENDENT_AMBULATORY_CARE_PROVIDER_SITE_OTHER): Payer: Medicare Other | Admitting: *Deleted

## 2018-01-21 DIAGNOSIS — I359 Nonrheumatic aortic valve disorder, unspecified: Secondary | ICD-10-CM

## 2018-01-21 DIAGNOSIS — Z5181 Encounter for therapeutic drug level monitoring: Secondary | ICD-10-CM | POA: Diagnosis not present

## 2018-01-21 DIAGNOSIS — I4891 Unspecified atrial fibrillation: Secondary | ICD-10-CM | POA: Diagnosis not present

## 2018-01-21 DIAGNOSIS — I482 Chronic atrial fibrillation, unspecified: Secondary | ICD-10-CM

## 2018-01-21 DIAGNOSIS — Z01810 Encounter for preprocedural cardiovascular examination: Secondary | ICD-10-CM

## 2018-01-21 LAB — POCT INR: INR: 2.4

## 2018-01-21 NOTE — Patient Instructions (Signed)
Continue coumadin 2 tablets daily except 1 tablet on Sundays, Tuesdays and Thursdays Recheck in 3 weeks

## 2018-02-09 ENCOUNTER — Telehealth: Payer: Self-pay | Admitting: *Deleted

## 2018-02-09 NOTE — Telephone Encounter (Signed)
Appt rescheduled for tomorrow in [place] per pt request.

## 2018-02-09 NOTE — Telephone Encounter (Signed)
Pt wants to know if he can be seen today

## 2018-02-16 ENCOUNTER — Ambulatory Visit (INDEPENDENT_AMBULATORY_CARE_PROVIDER_SITE_OTHER): Payer: Medicare Other | Admitting: *Deleted

## 2018-02-16 DIAGNOSIS — Z5181 Encounter for therapeutic drug level monitoring: Secondary | ICD-10-CM

## 2018-02-16 DIAGNOSIS — I359 Nonrheumatic aortic valve disorder, unspecified: Secondary | ICD-10-CM

## 2018-02-16 DIAGNOSIS — I4891 Unspecified atrial fibrillation: Secondary | ICD-10-CM

## 2018-02-16 DIAGNOSIS — Z952 Presence of prosthetic heart valve: Secondary | ICD-10-CM | POA: Diagnosis not present

## 2018-02-16 DIAGNOSIS — I482 Chronic atrial fibrillation, unspecified: Secondary | ICD-10-CM

## 2018-02-16 DIAGNOSIS — Z01810 Encounter for preprocedural cardiovascular examination: Secondary | ICD-10-CM

## 2018-02-16 LAB — POCT INR: INR: 1.4

## 2018-02-16 NOTE — Patient Instructions (Signed)
Take coumadin 3 tablets tonight, 2 tablets tomorrow night then resume 2 tablets daily except 1 tablet on Sundays, Tuesdays and Thursdays Recheck in 1.5 weeks

## 2018-02-23 DIAGNOSIS — Z7901 Long term (current) use of anticoagulants: Secondary | ICD-10-CM | POA: Diagnosis not present

## 2018-02-23 DIAGNOSIS — J449 Chronic obstructive pulmonary disease, unspecified: Secondary | ICD-10-CM | POA: Diagnosis not present

## 2018-02-23 DIAGNOSIS — I340 Nonrheumatic mitral (valve) insufficiency: Secondary | ICD-10-CM | POA: Diagnosis not present

## 2018-02-23 DIAGNOSIS — I10 Essential (primary) hypertension: Secondary | ICD-10-CM | POA: Diagnosis not present

## 2018-02-25 ENCOUNTER — Ambulatory Visit (INDEPENDENT_AMBULATORY_CARE_PROVIDER_SITE_OTHER): Payer: Medicare Other | Admitting: *Deleted

## 2018-02-25 DIAGNOSIS — I482 Chronic atrial fibrillation, unspecified: Secondary | ICD-10-CM

## 2018-02-25 DIAGNOSIS — I359 Nonrheumatic aortic valve disorder, unspecified: Secondary | ICD-10-CM

## 2018-02-25 DIAGNOSIS — I4891 Unspecified atrial fibrillation: Secondary | ICD-10-CM

## 2018-02-25 DIAGNOSIS — Z5181 Encounter for therapeutic drug level monitoring: Secondary | ICD-10-CM

## 2018-02-25 LAB — POCT INR: INR: 2.5

## 2018-02-25 NOTE — Patient Instructions (Signed)
Continue coumadin 2 tablets daily except 1 tablet on Sundays, Tuesdays and Thursdays Recheck in 3 weeks 

## 2018-02-27 DIAGNOSIS — Z0000 Encounter for general adult medical examination without abnormal findings: Secondary | ICD-10-CM | POA: Diagnosis not present

## 2018-02-27 DIAGNOSIS — I10 Essential (primary) hypertension: Secondary | ICD-10-CM | POA: Diagnosis not present

## 2018-02-27 DIAGNOSIS — I340 Nonrheumatic mitral (valve) insufficiency: Secondary | ICD-10-CM | POA: Diagnosis not present

## 2018-02-27 DIAGNOSIS — J449 Chronic obstructive pulmonary disease, unspecified: Secondary | ICD-10-CM | POA: Diagnosis not present

## 2018-03-23 ENCOUNTER — Other Ambulatory Visit: Payer: Self-pay | Admitting: Cardiology

## 2018-03-24 ENCOUNTER — Ambulatory Visit (INDEPENDENT_AMBULATORY_CARE_PROVIDER_SITE_OTHER): Payer: Medicare Other | Admitting: *Deleted

## 2018-03-24 DIAGNOSIS — Z5181 Encounter for therapeutic drug level monitoring: Secondary | ICD-10-CM

## 2018-03-24 DIAGNOSIS — I359 Nonrheumatic aortic valve disorder, unspecified: Secondary | ICD-10-CM

## 2018-03-24 DIAGNOSIS — I4891 Unspecified atrial fibrillation: Secondary | ICD-10-CM

## 2018-03-24 DIAGNOSIS — I482 Chronic atrial fibrillation, unspecified: Secondary | ICD-10-CM

## 2018-03-24 LAB — POCT INR: INR: 2.8

## 2018-03-24 NOTE — Patient Instructions (Signed)
Continue coumadin 2 tablets daily except 1 tablet on Sundays, Tuesdays and Thursdays Recheck in 4 weeks

## 2018-04-22 ENCOUNTER — Ambulatory Visit (INDEPENDENT_AMBULATORY_CARE_PROVIDER_SITE_OTHER): Payer: Medicare Other | Admitting: *Deleted

## 2018-04-22 DIAGNOSIS — I359 Nonrheumatic aortic valve disorder, unspecified: Secondary | ICD-10-CM | POA: Diagnosis not present

## 2018-04-22 DIAGNOSIS — I4891 Unspecified atrial fibrillation: Secondary | ICD-10-CM | POA: Diagnosis not present

## 2018-04-22 DIAGNOSIS — Z5181 Encounter for therapeutic drug level monitoring: Secondary | ICD-10-CM

## 2018-04-22 DIAGNOSIS — I482 Chronic atrial fibrillation, unspecified: Secondary | ICD-10-CM

## 2018-04-22 LAB — POCT INR: INR: 2.4 (ref 2.0–3.0)

## 2018-04-22 NOTE — Patient Instructions (Signed)
Continue coumadin 2 tablets daily except 1 tablet on Sundays, Tuesdays and Thursdays Recheck in 6 weeks

## 2018-06-03 ENCOUNTER — Ambulatory Visit (INDEPENDENT_AMBULATORY_CARE_PROVIDER_SITE_OTHER): Payer: Medicare Other | Admitting: *Deleted

## 2018-06-03 DIAGNOSIS — I482 Chronic atrial fibrillation, unspecified: Secondary | ICD-10-CM

## 2018-06-03 DIAGNOSIS — I359 Nonrheumatic aortic valve disorder, unspecified: Secondary | ICD-10-CM

## 2018-06-03 DIAGNOSIS — I4891 Unspecified atrial fibrillation: Secondary | ICD-10-CM | POA: Diagnosis not present

## 2018-06-03 DIAGNOSIS — Z5181 Encounter for therapeutic drug level monitoring: Secondary | ICD-10-CM

## 2018-06-03 DIAGNOSIS — M25511 Pain in right shoulder: Secondary | ICD-10-CM | POA: Diagnosis not present

## 2018-06-03 DIAGNOSIS — M25512 Pain in left shoulder: Secondary | ICD-10-CM | POA: Diagnosis not present

## 2018-06-03 LAB — POCT INR: INR: 2.8 (ref 2.0–3.0)

## 2018-06-03 NOTE — Patient Instructions (Signed)
Continue coumadin 2 tablets daily except 1 tablet on Sundays, Tuesdays and Thursdays Recheck in 6 weeks

## 2018-07-07 DIAGNOSIS — R0689 Other abnormalities of breathing: Secondary | ICD-10-CM | POA: Diagnosis not present

## 2018-07-07 DIAGNOSIS — I959 Hypotension, unspecified: Secondary | ICD-10-CM | POA: Diagnosis not present

## 2018-07-07 DIAGNOSIS — R000 Tachycardia, unspecified: Secondary | ICD-10-CM | POA: Diagnosis not present

## 2018-07-07 DIAGNOSIS — I499 Cardiac arrhythmia, unspecified: Secondary | ICD-10-CM | POA: Diagnosis not present

## 2018-07-12 DIAGNOSIS — 419620001 Death: Secondary | SNOMED CT | POA: Diagnosis not present

## 2018-07-12 DEATH — deceased

## 2018-07-29 ENCOUNTER — Other Ambulatory Visit (HOSPITAL_COMMUNITY): Payer: Medicare Other

## 2018-08-05 ENCOUNTER — Ambulatory Visit: Payer: Medicare Other | Admitting: Cardiology

## 2019-02-19 IMAGING — DX DG ABDOMEN ACUTE W/ 1V CHEST
3 series · 3 of 3 positions shown · non-contrast
Comparison: Chest 02/05/2017

CLINICAL DATA: Abdominal pain since this morning.

EXAM:
DG ABDOMEN ACUTE W/ 1V CHEST

[abdomen erect]
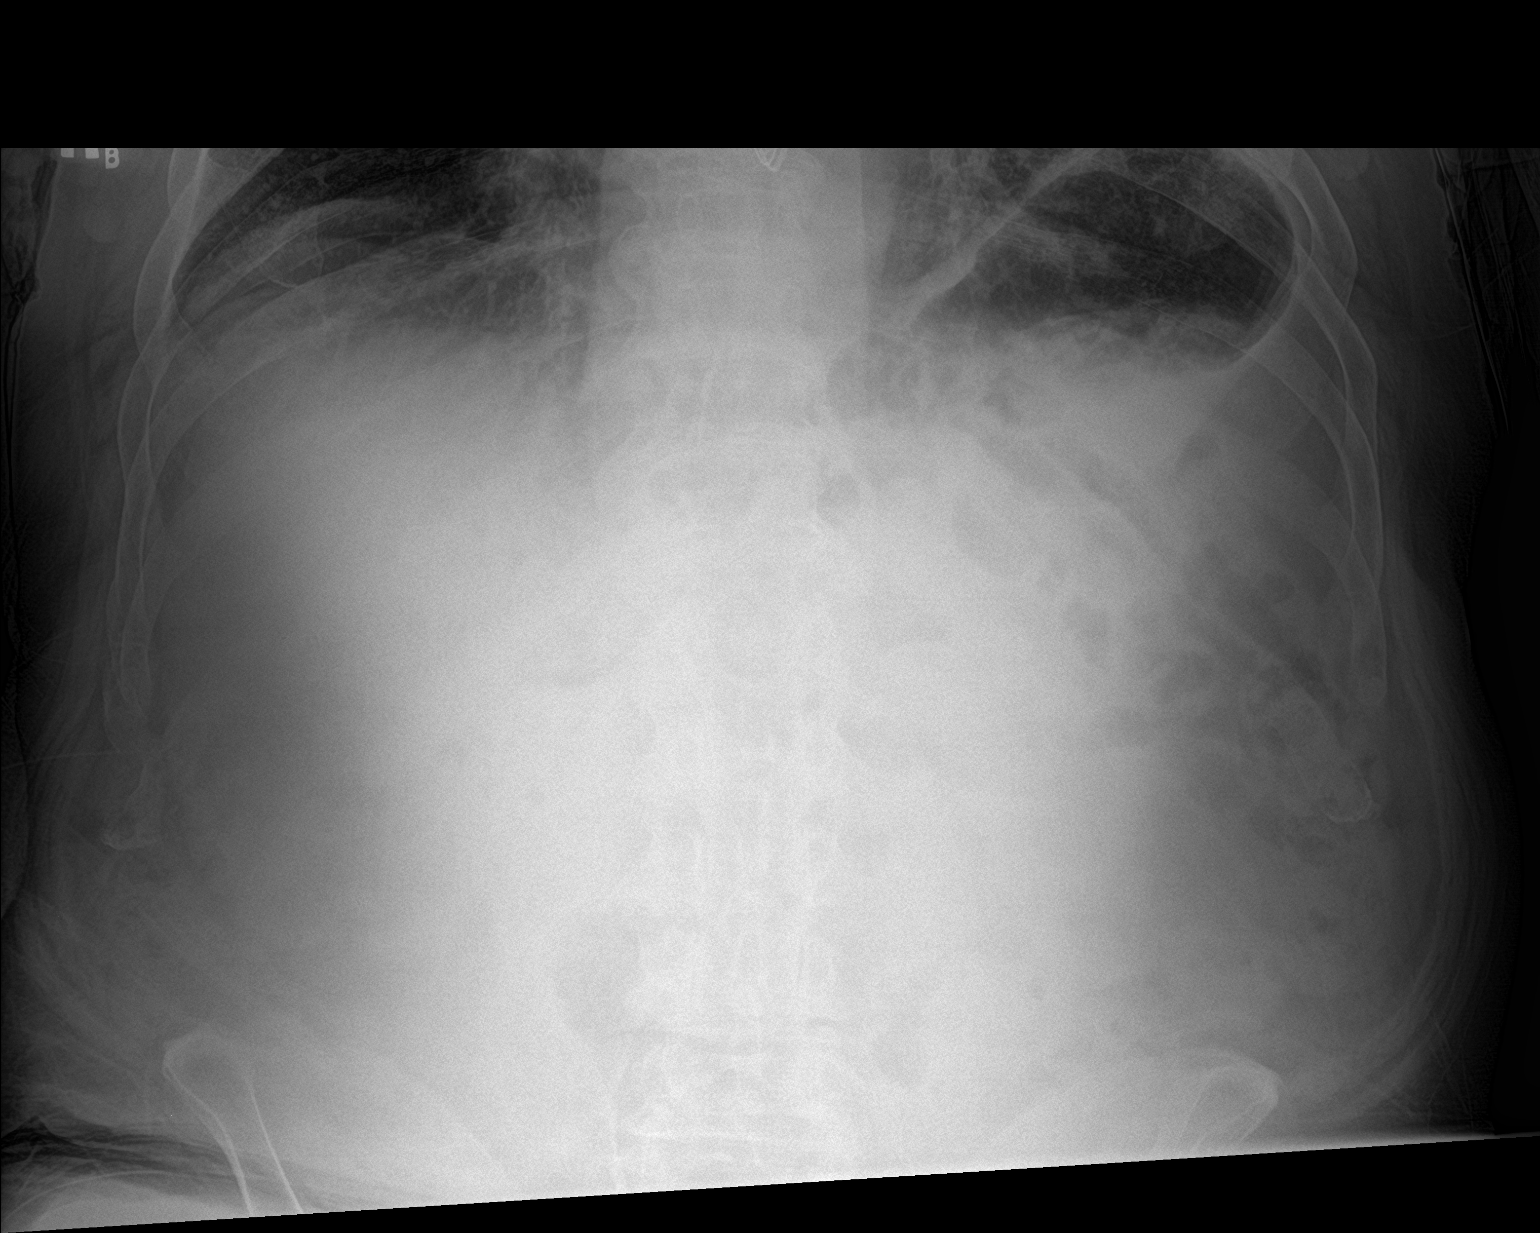

[abdomen supine]
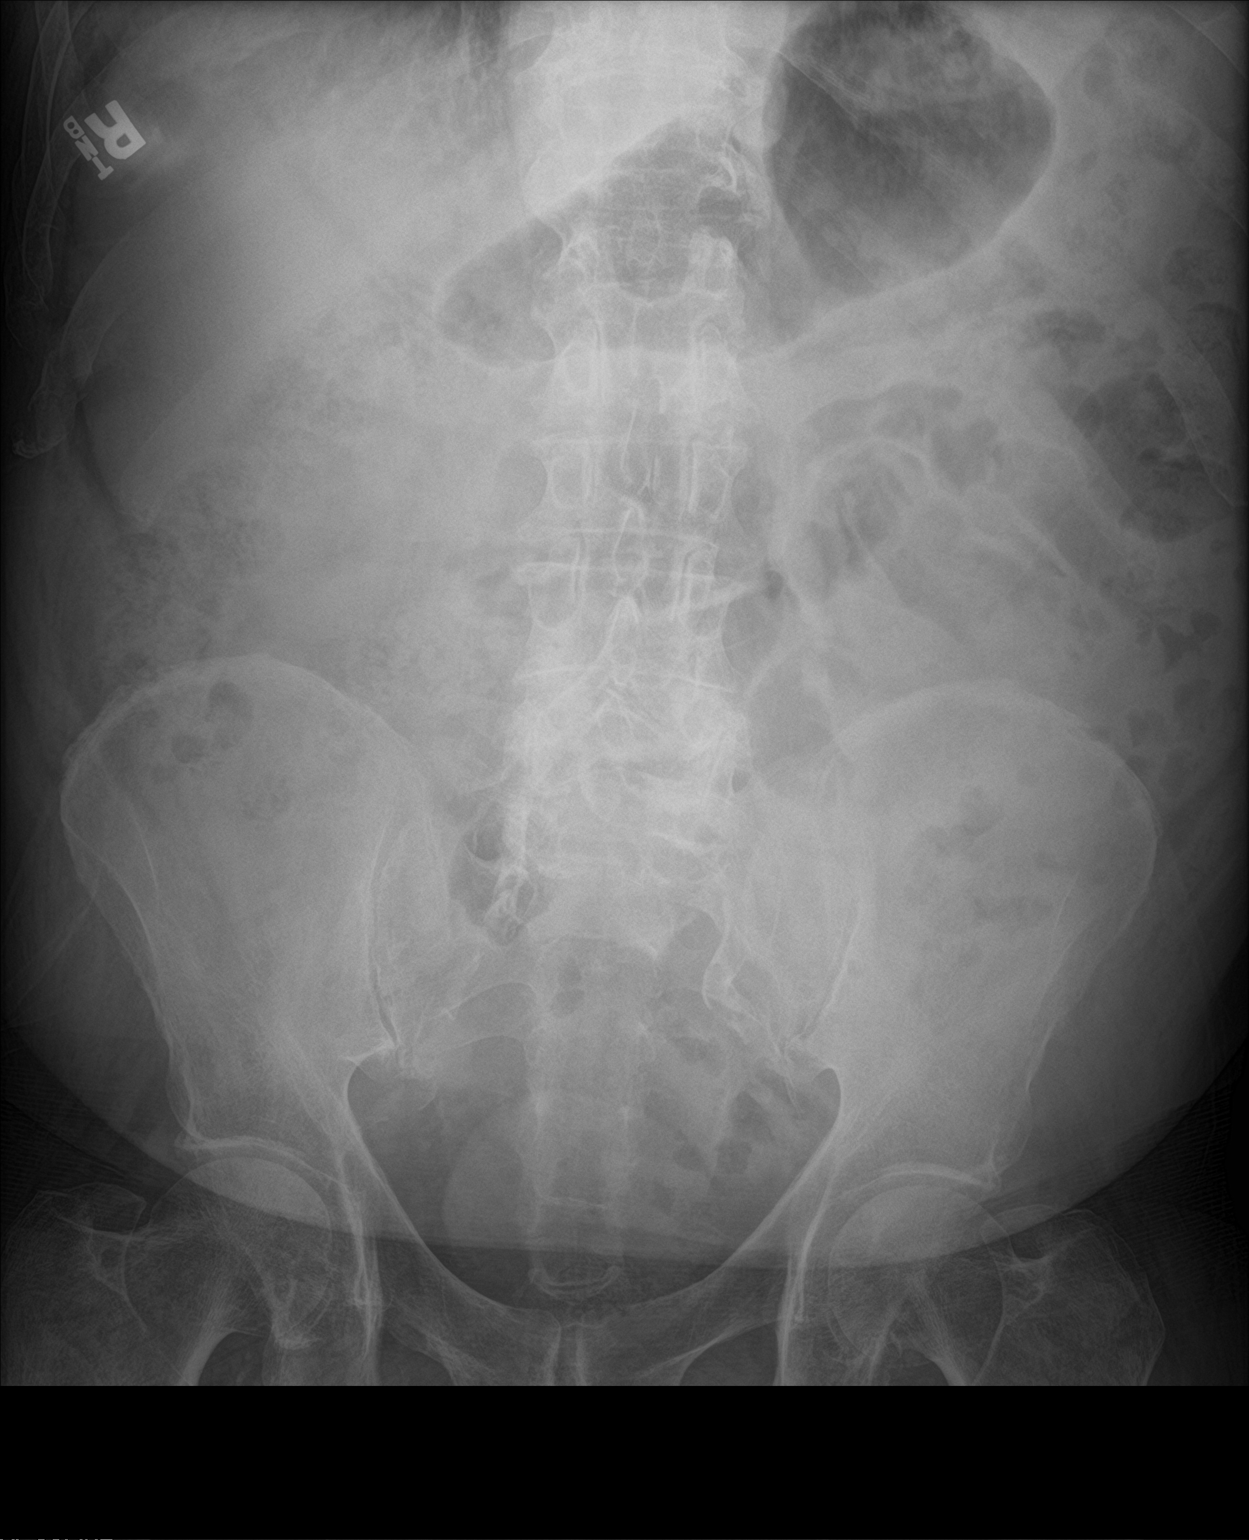

[chest ap]
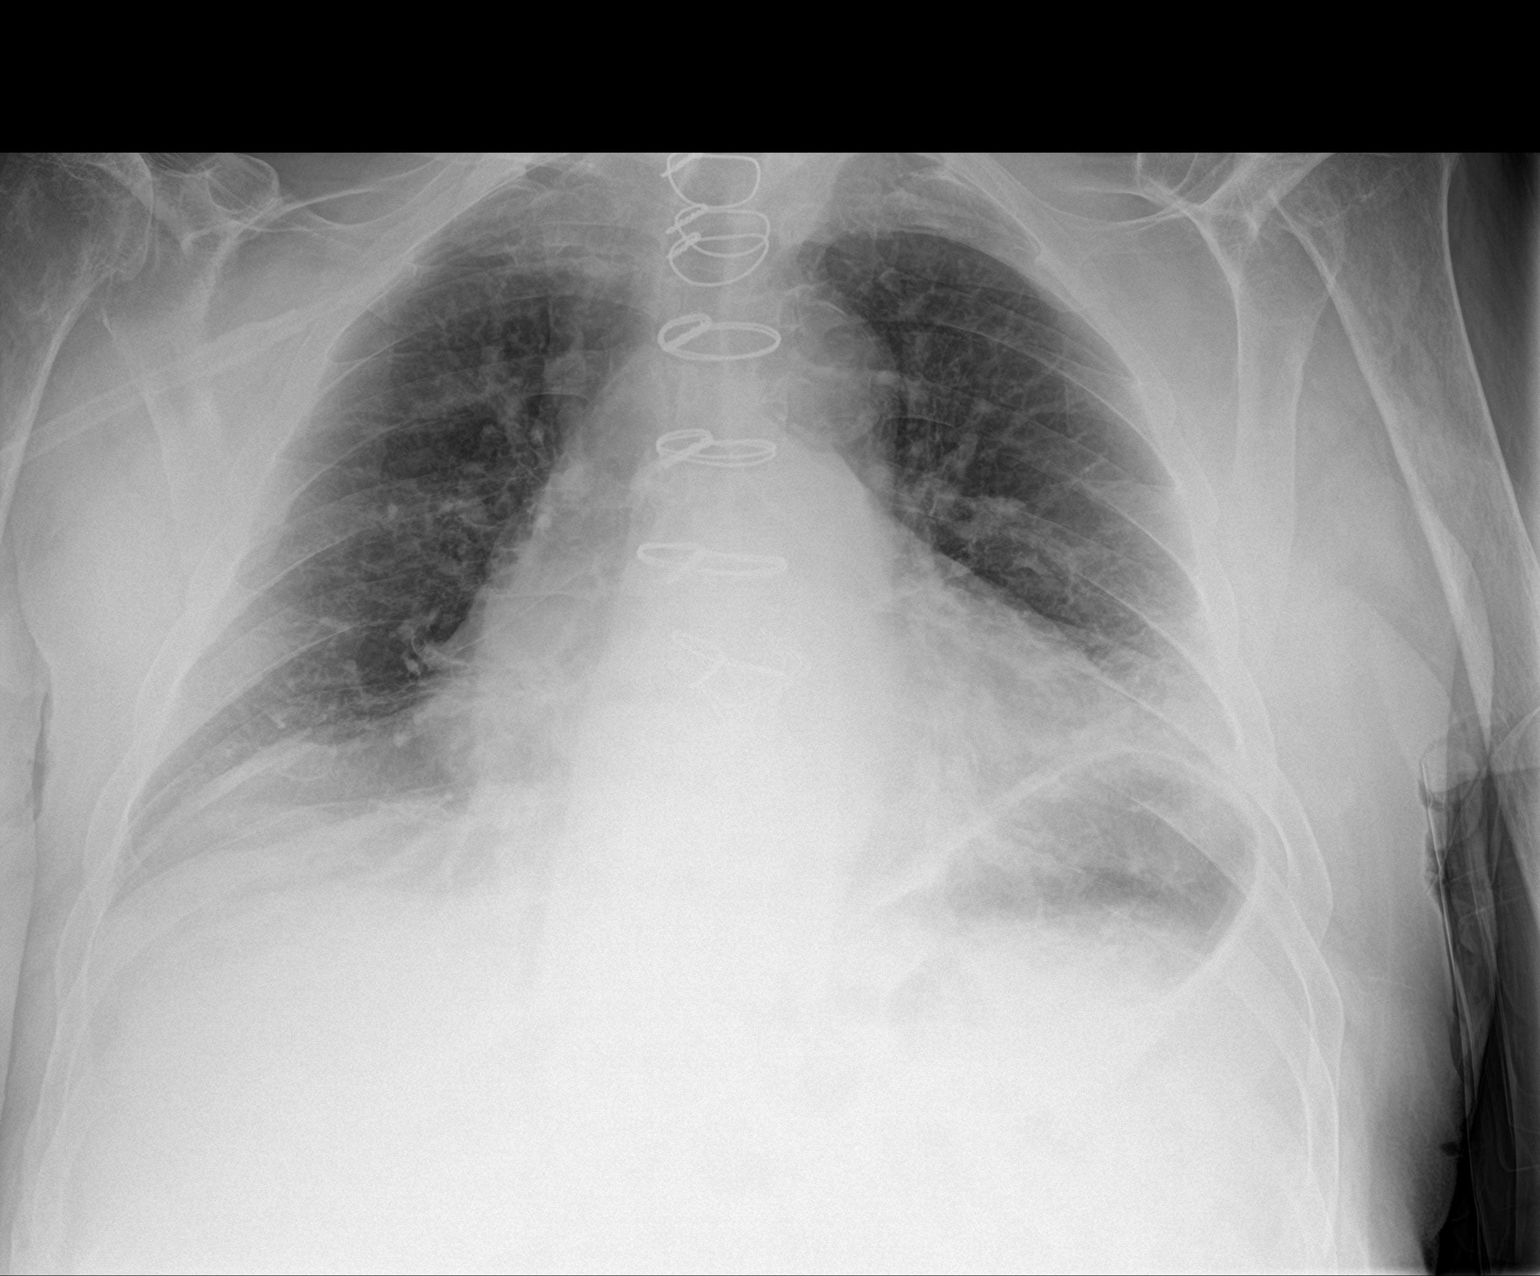

[3 of 3 positions shown; findings below may reference images not displayed]

FINDINGS: Postoperative changes in the mediastinum. Shallow inspiration with
atelectasis in the lung bases. Cardiac enlargement without vascular
congestion or edema. No focal consolidation. No pneumothorax.
Calcification of the aorta. Severe degenerative changes in the
shoulders with resection or resorption of the distal left clavicle.

Scattered gas and stool throughout the colon. No small or large
bowel distention. No free intra-abdominal air. No abnormal air-fluid
levels. Degenerative changes in the spine and hips.
IMPRESSION: Shallow inspiration with atelectasis in the lung bases. Cardiac
enlargement. Aortic atherosclerosis.

Nonobstructive bowel gas pattern with stool-filled colon.

## 2019-02-21 IMAGING — RF DG ERCP WO/W SPHINCTEROTOMY
1 series · 15 of 23 positions shown · non-contrast
Comparison: MRCP 12/02/2017

CLINICAL DATA: Choledocholithiasis

EXAM:
ERCP WITH SPHINCTEROTOMY
TECHNIQUE: Multiple spot images obtained with the fluoroscopic device and
submitted for interpretation post-procedure.
FLUOROSCOPY TIME:  Fluoroscopy Time:  2 minutes 51 seconds
Radiation Exposure Index (if provided by the fluoroscopic device):
Not provided
Number of Acquired Spot Images: multiple fluoroscopic screen
captures

[Series 1: run · 8 acquisitions, 15 frames shown]
[im 1/8]
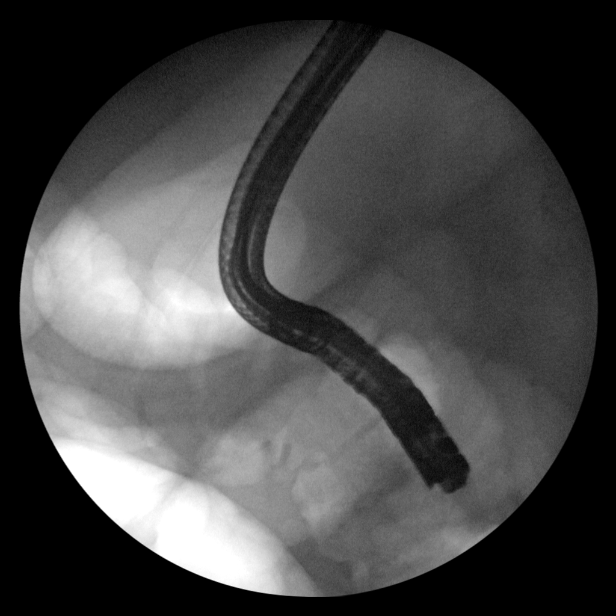
[im 2/8]
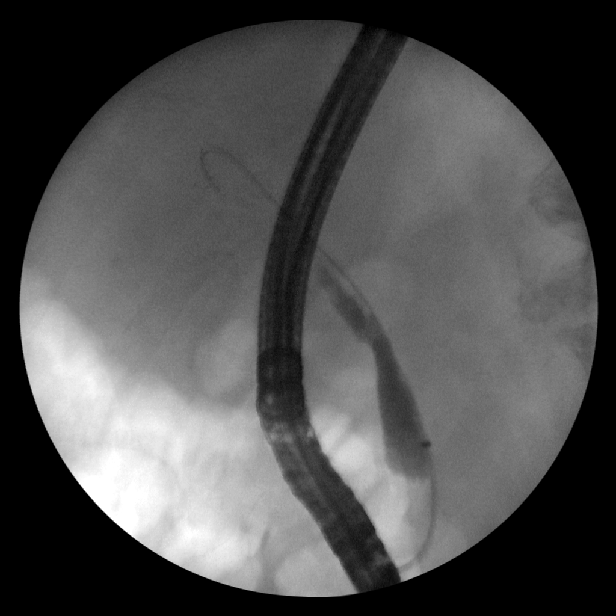
[im 2/8]
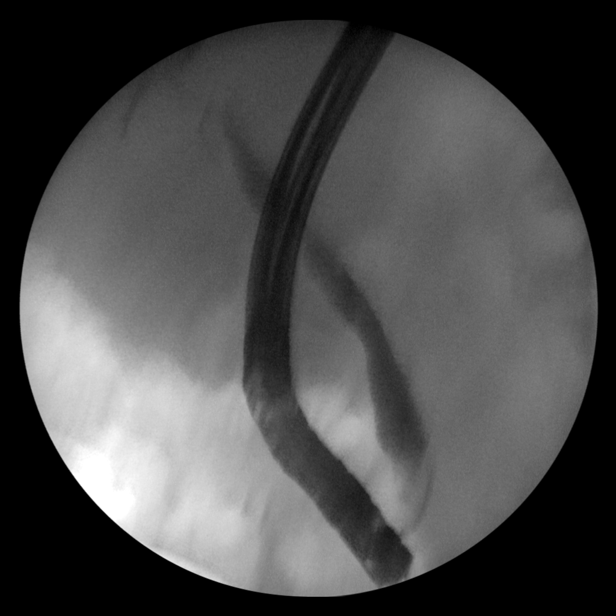
[im 3/8]
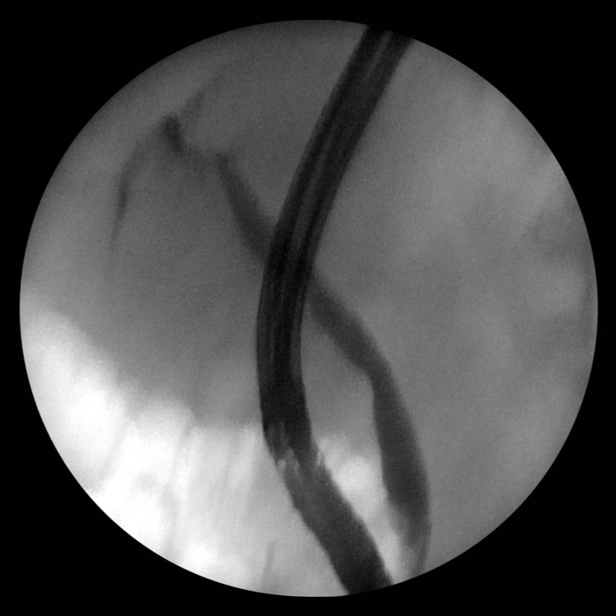
[im 3/8]
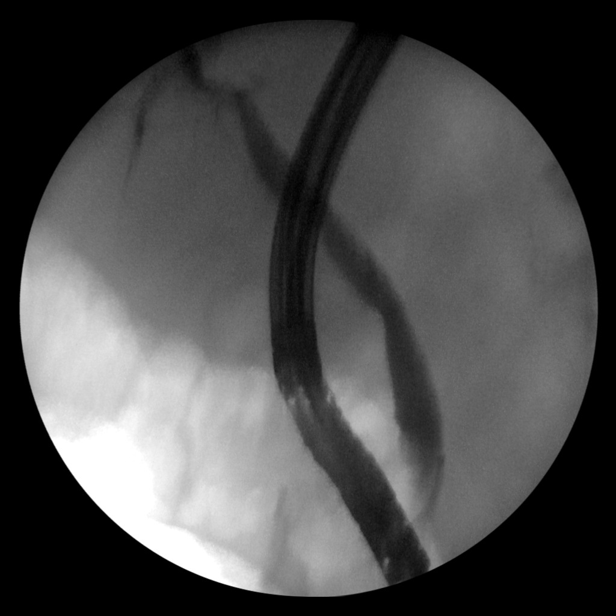
[im 4/8]
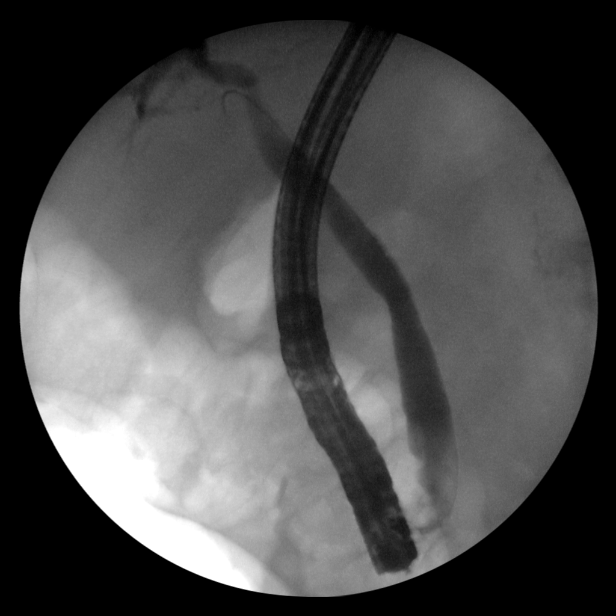
[im 4/8]
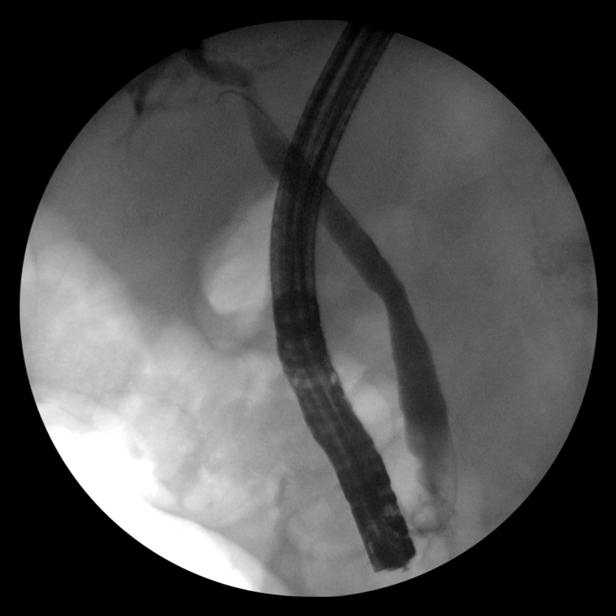
[im 4/8]
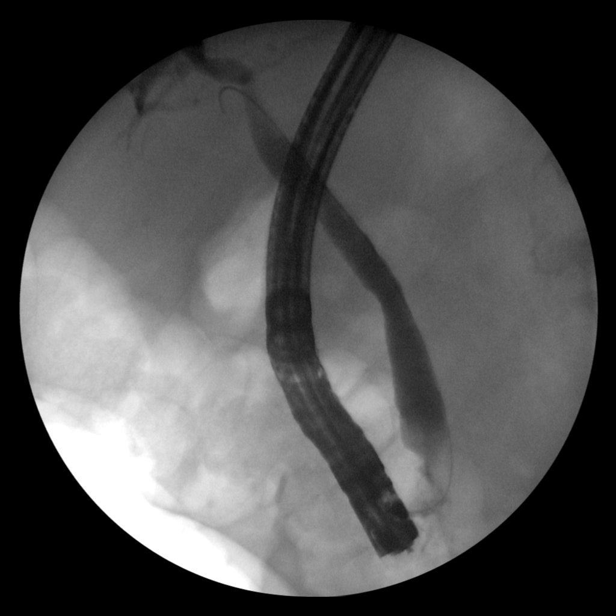
[im 5/8]
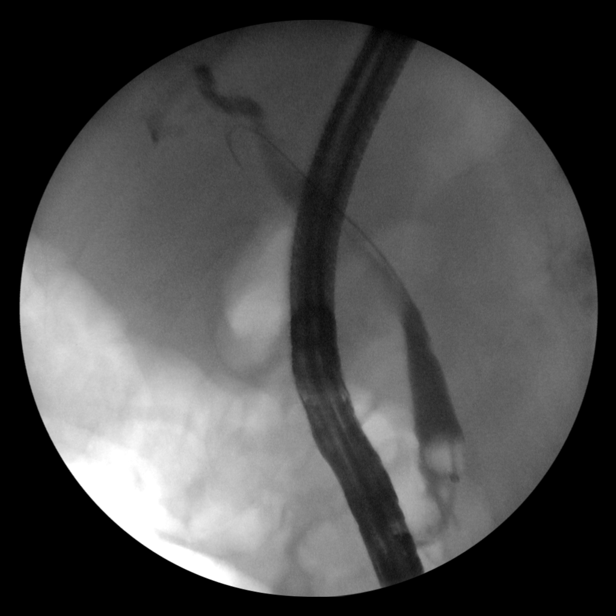
[im 5/8]
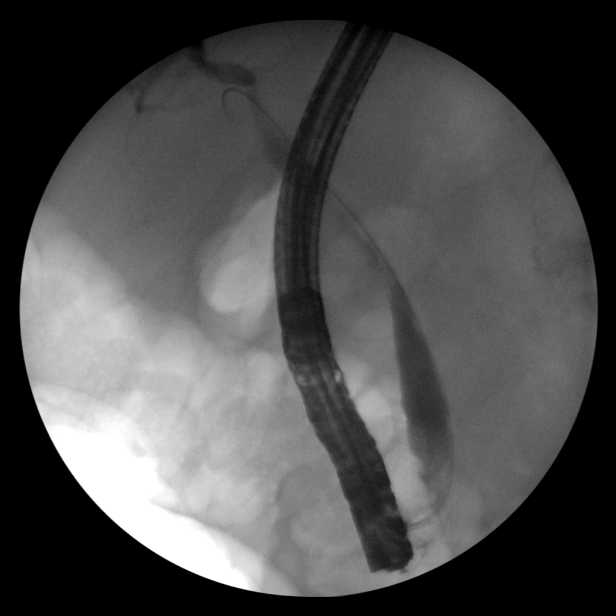
[im 6/8]
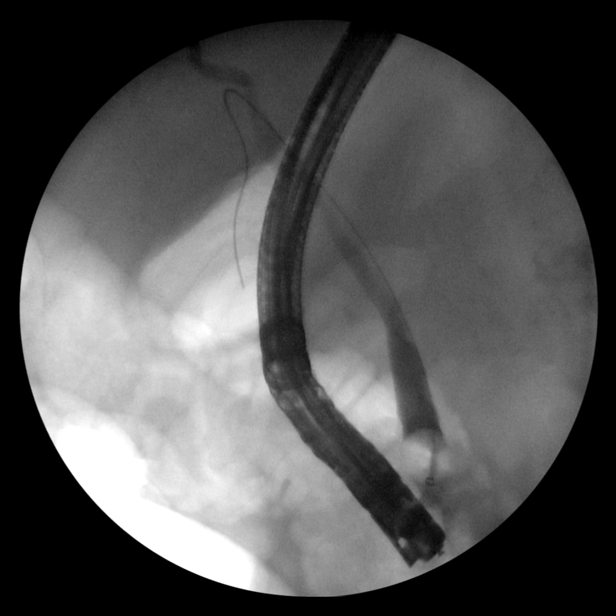
[im 6/8]
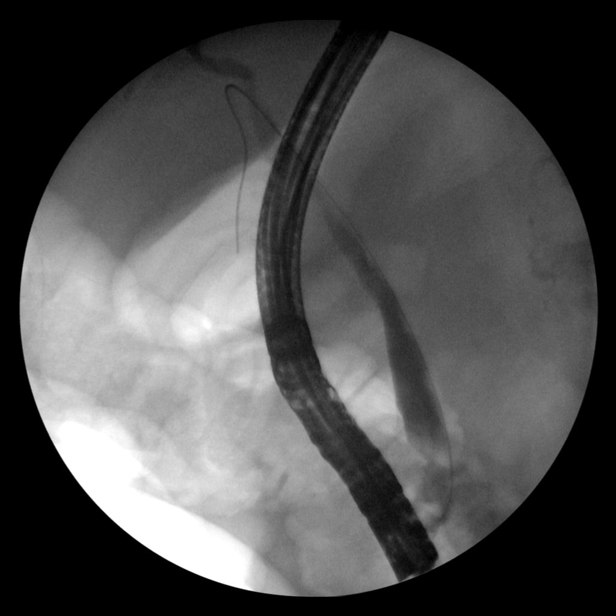
[im 7/8]
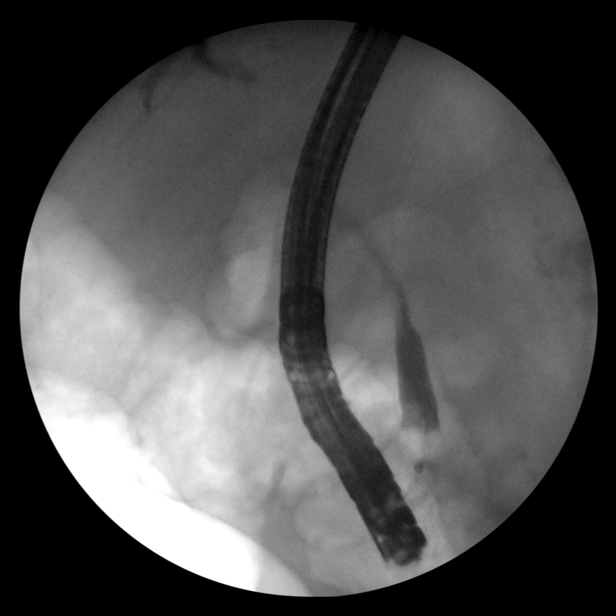
[im 7/8]
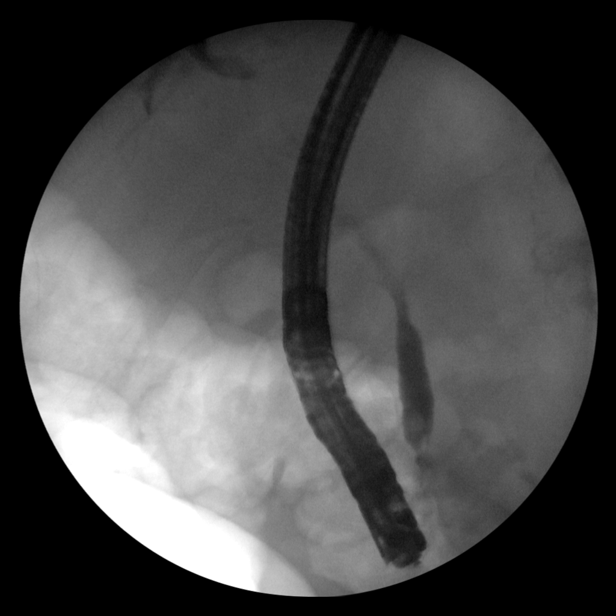
[im 8/8]
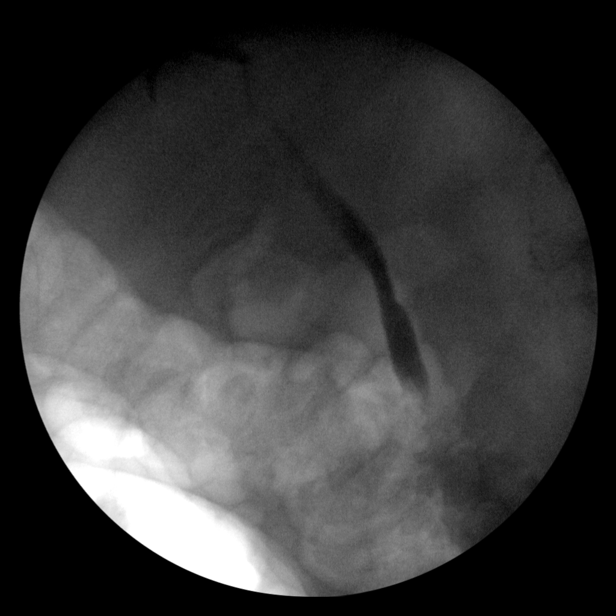

[15 of 23 positions shown; findings below may reference images not displayed]

FINDINGS: Images demonstrate upper normal diameter CBD with smooth walls.

A few small filling defects are seen at the distal CBD compatible
with choledocholithiasis.

Multiple small air bubbles are seen later in the exam within the
distal CBD.

Passage of a balloon catheter was made with clearance of filling
defects.

Endoscopic visualization confirmed removal of stones with balloon
catheter passage.

Last image demonstrates adequate drainage of contrast from the the
CBD
IMPRESSION: Choledocholithiasis.

These images were submitted for radiologic interpretation only.
Please see the procedural report for the amount of contrast and the
fluoroscopy time utilized.

## 2019-02-23 IMAGING — CR DG ABD PORTABLE 1V
1 series · 1 of 1 positions shown · non-contrast
Comparison: Portable exam 8243 hours compared to 12/02/2017

CLINICAL DATA: Nausea, abdominal distension, recent ERCP and
sphincterotomy with extraction of CBD stones

EXAM:
PORTABLE ABDOMEN - 1 VIEW

[supine ap]
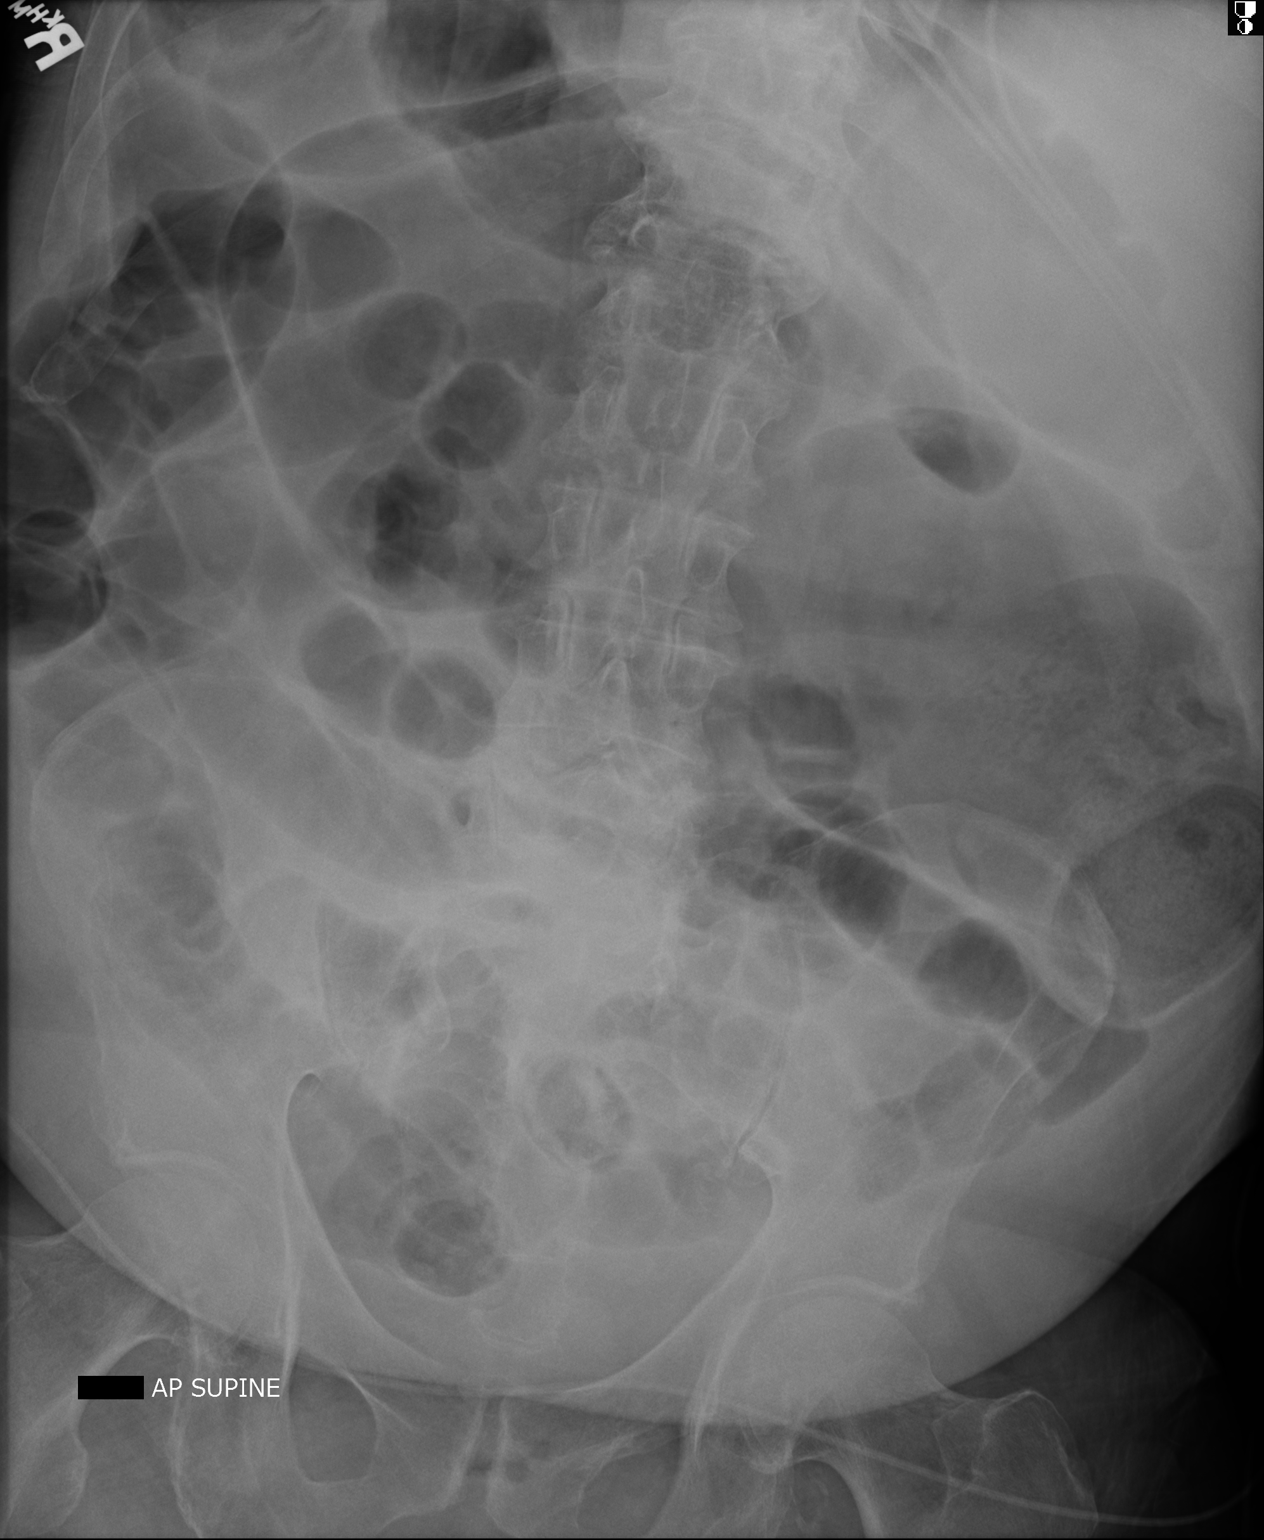

[1 of 1 positions shown; findings below may reference images not displayed]

FINDINGS: Air-filled small bowel loops throughout abdomen. Gaseous distention
of transverse colon up to 13.2 cm diameter. No definite bowel wall
thickening. Bones demineralized with degenerative disc and facet
disease changes of the lumbar spine.
IMPRESSION: Gaseous distention of colon up to 13.2 cm diameter.

Additional air-filled upper normal caliber small bowel loops.
